# Patient Record
Sex: Female | Born: 1940 | Race: Black or African American | Hispanic: No | Marital: Single | Smoking: Former smoker
Health system: Southern US, Community
[De-identification: ages and names within clinical notes are randomized; demographics above are authoritative.]

## PROBLEM LIST (undated history)

## (undated) DIAGNOSIS — M542 Cervicalgia: Secondary | ICD-10-CM

## (undated) DIAGNOSIS — K219 Gastro-esophageal reflux disease without esophagitis: Secondary | ICD-10-CM

## (undated) DIAGNOSIS — M545 Low back pain, unspecified: Secondary | ICD-10-CM

## (undated) DIAGNOSIS — E785 Hyperlipidemia, unspecified: Secondary | ICD-10-CM

## (undated) DIAGNOSIS — I10 Essential (primary) hypertension: Secondary | ICD-10-CM

## (undated) DIAGNOSIS — M329 Systemic lupus erythematosus, unspecified: Secondary | ICD-10-CM

## (undated) DIAGNOSIS — L309 Dermatitis, unspecified: Secondary | ICD-10-CM

## (undated) DIAGNOSIS — G4730 Sleep apnea, unspecified: Secondary | ICD-10-CM

## (undated) DIAGNOSIS — I509 Heart failure, unspecified: Secondary | ICD-10-CM

## (undated) DIAGNOSIS — M5136 Other intervertebral disc degeneration, lumbar region: Secondary | ICD-10-CM

## (undated) DIAGNOSIS — IMO0002 Reserved for concepts with insufficient information to code with codable children: Secondary | ICD-10-CM

## (undated) DIAGNOSIS — M8580 Other specified disorders of bone density and structure, unspecified site: Secondary | ICD-10-CM

## (undated) DIAGNOSIS — M1990 Unspecified osteoarthritis, unspecified site: Secondary | ICD-10-CM

## (undated) DIAGNOSIS — E119 Type 2 diabetes mellitus without complications: Secondary | ICD-10-CM

## (undated) DIAGNOSIS — J449 Chronic obstructive pulmonary disease, unspecified: Secondary | ICD-10-CM

## (undated) DIAGNOSIS — J309 Allergic rhinitis, unspecified: Secondary | ICD-10-CM

## (undated) DIAGNOSIS — C801 Malignant (primary) neoplasm, unspecified: Secondary | ICD-10-CM

## (undated) DIAGNOSIS — E669 Obesity, unspecified: Secondary | ICD-10-CM

## (undated) DIAGNOSIS — R0600 Dyspnea, unspecified: Secondary | ICD-10-CM

## (undated) DIAGNOSIS — F419 Anxiety disorder, unspecified: Secondary | ICD-10-CM

## (undated) DIAGNOSIS — H409 Unspecified glaucoma: Secondary | ICD-10-CM

## (undated) DIAGNOSIS — M51369 Other intervertebral disc degeneration, lumbar region without mention of lumbar back pain or lower extremity pain: Secondary | ICD-10-CM

## (undated) DIAGNOSIS — Z9981 Dependence on supplemental oxygen: Secondary | ICD-10-CM

## (undated) DIAGNOSIS — J45909 Unspecified asthma, uncomplicated: Secondary | ICD-10-CM

## (undated) DIAGNOSIS — R202 Paresthesia of skin: Secondary | ICD-10-CM

## (undated) DIAGNOSIS — I2699 Other pulmonary embolism without acute cor pulmonale: Secondary | ICD-10-CM

## (undated) DIAGNOSIS — R200 Anesthesia of skin: Secondary | ICD-10-CM

## (undated) HISTORY — DX: Other intervertebral disc degeneration, lumbar region: M51.36

## (undated) HISTORY — PX: BREAST BIOPSY: SHX20

## (undated) HISTORY — DX: Paresthesia of skin: R20.0

## (undated) HISTORY — DX: Allergic rhinitis, unspecified: J30.9

## (undated) HISTORY — PX: EXPLORATORY LAPAROTOMY: SUR591

## (undated) HISTORY — DX: Other intervertebral disc degeneration, lumbar region without mention of lumbar back pain or lower extremity pain: M51.369

## (undated) HISTORY — DX: Unspecified asthma, uncomplicated: J45.909

## (undated) HISTORY — DX: Unspecified glaucoma: H40.9

## (undated) HISTORY — PX: BREAST EXCISIONAL BIOPSY: SUR124

## (undated) HISTORY — DX: Unspecified osteoarthritis, unspecified site: M19.90

## (undated) HISTORY — PX: BREAST LUMPECTOMY: SHX2

## (undated) HISTORY — DX: Hyperlipidemia, unspecified: E78.5

## (undated) HISTORY — DX: Low back pain, unspecified: M54.50

## (undated) HISTORY — DX: Low back pain: M54.5

## (undated) HISTORY — DX: Dermatitis, unspecified: L30.9

## (undated) HISTORY — DX: Anxiety disorder, unspecified: F41.9

## (undated) HISTORY — DX: Sleep apnea, unspecified: G47.30

## (undated) HISTORY — DX: Essential (primary) hypertension: I10

## (undated) HISTORY — DX: Other pulmonary embolism without acute cor pulmonale: I26.99

## (undated) HISTORY — DX: Systemic lupus erythematosus, unspecified: M32.9

## (undated) HISTORY — DX: Obesity, unspecified: E66.9

## (undated) HISTORY — DX: Anesthesia of skin: R20.2

## (undated) HISTORY — DX: Cervicalgia: M54.2

## (undated) HISTORY — DX: Gastro-esophageal reflux disease without esophagitis: K21.9

## (undated) HISTORY — DX: Other specified disorders of bone density and structure, unspecified site: M85.80

---

## 1974-04-18 HISTORY — PX: ABDOMINAL HYSTERECTOMY: SHX81

## 1994-04-18 HISTORY — PX: TOE SURGERY: SHX1073

## 2004-04-18 DIAGNOSIS — C801 Malignant (primary) neoplasm, unspecified: Secondary | ICD-10-CM

## 2004-04-18 HISTORY — PX: BACK SURGERY: SHX140

## 2004-04-18 HISTORY — DX: Malignant (primary) neoplasm, unspecified: C80.1

## 2008-04-18 DIAGNOSIS — G4730 Sleep apnea, unspecified: Secondary | ICD-10-CM

## 2008-04-18 HISTORY — PX: EYE SURGERY: SHX253

## 2008-04-18 HISTORY — DX: Sleep apnea, unspecified: G47.30

## 2008-04-29 ENCOUNTER — Ambulatory Visit: Payer: Self-pay | Admitting: Oncology

## 2008-05-02 HISTORY — PX: OTHER SURGICAL HISTORY: SHX169

## 2008-08-05 ENCOUNTER — Ambulatory Visit: Payer: Self-pay | Admitting: Oncology

## 2009-03-31 ENCOUNTER — Encounter: Admission: RE | Admit: 2009-03-31 | Discharge: 2009-03-31 | Payer: Self-pay | Admitting: Internal Medicine

## 2009-05-01 ENCOUNTER — Inpatient Hospital Stay (HOSPITAL_COMMUNITY): Admission: EM | Admit: 2009-05-01 | Discharge: 2009-05-05 | Payer: Self-pay | Admitting: Emergency Medicine

## 2009-05-04 ENCOUNTER — Encounter (INDEPENDENT_AMBULATORY_CARE_PROVIDER_SITE_OTHER): Payer: Self-pay | Admitting: Internal Medicine

## 2009-05-04 ENCOUNTER — Ambulatory Visit: Payer: Self-pay | Admitting: Vascular Surgery

## 2009-06-19 ENCOUNTER — Encounter: Admission: RE | Admit: 2009-06-19 | Discharge: 2009-06-19 | Payer: Self-pay | Admitting: Rheumatology

## 2010-01-16 ENCOUNTER — Emergency Department (HOSPITAL_COMMUNITY): Admission: EM | Admit: 2010-01-16 | Discharge: 2010-01-16 | Payer: Self-pay | Admitting: Emergency Medicine

## 2010-01-25 ENCOUNTER — Inpatient Hospital Stay (HOSPITAL_COMMUNITY): Admission: EM | Admit: 2010-01-25 | Discharge: 2010-02-02 | Payer: Self-pay | Admitting: Emergency Medicine

## 2010-04-01 ENCOUNTER — Encounter
Admission: RE | Admit: 2010-04-01 | Discharge: 2010-04-01 | Payer: Self-pay | Source: Home / Self Care | Attending: Internal Medicine | Admitting: Internal Medicine

## 2010-04-08 ENCOUNTER — Encounter
Admission: RE | Admit: 2010-04-08 | Discharge: 2010-04-08 | Payer: Self-pay | Source: Home / Self Care | Attending: Internal Medicine | Admitting: Internal Medicine

## 2010-06-30 LAB — BASIC METABOLIC PANEL
BUN: 28 mg/dL — ABNORMAL HIGH (ref 6–23)
BUN: 31 mg/dL — ABNORMAL HIGH (ref 6–23)
CO2: 30 mEq/L (ref 19–32)
Chloride: 100 mEq/L (ref 96–112)
Chloride: 101 mEq/L (ref 96–112)
Chloride: 102 mEq/L (ref 96–112)
Creatinine, Ser: 1.51 mg/dL — ABNORMAL HIGH (ref 0.4–1.2)
GFR calc Af Amer: 41 mL/min — ABNORMAL LOW (ref >60)
GFR calc Af Amer: 53 mL/min — ABNORMAL LOW (ref >60)
GFR calc non Af Amer: 34 mL/min — ABNORMAL LOW (ref >60)
Glucose, Bld: 170 mg/dL — ABNORMAL HIGH (ref 70–99)
Potassium: 4.3 mEq/L (ref 3.5–5.1)
Potassium: 4.5 mEq/L (ref 3.5–5.1)
Potassium: 4.7 mEq/L (ref 3.5–5.1)
Sodium: 137 mEq/L (ref 135–145)
Sodium: 138 mEq/L (ref 135–145)

## 2010-06-30 LAB — CBC
HCT: 33.2 % — ABNORMAL LOW (ref 36.0–46.0)
HCT: 33.5 % — ABNORMAL LOW (ref 36.0–46.0)
HCT: 35.5 % — ABNORMAL LOW (ref 36.0–46.0)
Hemoglobin: 11.2 g/dL — ABNORMAL LOW (ref 12.0–15.0)
MCH: 29.4 pg (ref 26.0–34.0)
MCHC: 33.6 g/dL (ref 30.0–36.0)
MCV: 86.3 fL (ref 78.0–100.0)
MCV: 87.3 fL (ref 78.0–100.0)
Platelets: 253 10*3/uL (ref 150–400)
RBC: 3.81 MIL/uL — ABNORMAL LOW (ref 3.87–5.11)
RBC: 4.11 MIL/uL (ref 3.87–5.11)
RDW: 14.4 % (ref 11.5–15.5)
RDW: 14.8 % (ref 11.5–15.5)
WBC: 11.4 10*3/uL — ABNORMAL HIGH (ref 4.0–10.5)
WBC: 16.7 10*3/uL — ABNORMAL HIGH (ref 4.0–10.5)

## 2010-06-30 LAB — GLUCOSE, CAPILLARY
Glucose-Capillary: 253 mg/dL — ABNORMAL HIGH (ref 70–99)
Glucose-Capillary: 318 mg/dL — ABNORMAL HIGH (ref 70–99)
Glucose-Capillary: 339 mg/dL — ABNORMAL HIGH (ref 70–99)

## 2010-07-01 LAB — POCT CARDIAC MARKERS
CKMB, poc: 8 ng/mL (ref 1.0–8.0)
Troponin i, poc: <0.05 ng/mL (ref 0.00–0.09)

## 2010-07-01 LAB — DIFFERENTIAL
Eosinophils Absolute: 0 10*3/uL (ref 0.0–0.7)
Eosinophils Absolute: 0.4 10*3/uL (ref 0.0–0.7)
Eosinophils Relative: 5 % (ref 0–5)
Eosinophils Relative: 6 % — ABNORMAL HIGH (ref 0–5)
Lymphocytes Relative: 38 % (ref 12–46)
Lymphs Abs: 2.6 10*3/uL (ref 0.7–4.0)
Lymphs Abs: 0.9 10*3/uL (ref 0.7–4.0)
Lymphs Abs: 2.1 10*3/uL (ref 0.7–4.0)
Neutro Abs: 3.6 10*3/uL (ref 1.7–7.7)
Neutrophils Relative %: 55 % (ref 43–77)
Neutrophils Relative %: 89 % — ABNORMAL HIGH (ref 43–77)

## 2010-07-01 LAB — GLUCOSE, CAPILLARY
Glucose-Capillary: 151 mg/dL — ABNORMAL HIGH (ref 70–99)
Glucose-Capillary: 179 mg/dL — ABNORMAL HIGH (ref 70–99)
Glucose-Capillary: 185 mg/dL — ABNORMAL HIGH (ref 70–99)
Glucose-Capillary: 280 mg/dL — ABNORMAL HIGH (ref 70–99)
Glucose-Capillary: 289 mg/dL — ABNORMAL HIGH (ref 70–99)
Glucose-Capillary: 353 mg/dL — ABNORMAL HIGH (ref 70–99)
Glucose-Capillary: 421 mg/dL — ABNORMAL HIGH (ref 70–99)
Glucose-Capillary: 445 mg/dL — ABNORMAL HIGH (ref 70–99)
Glucose-Capillary: 447 mg/dL — ABNORMAL HIGH (ref 70–99)
Glucose-Capillary: 451 mg/dL — ABNORMAL HIGH (ref 70–99)
Glucose-Capillary: 462 mg/dL — ABNORMAL HIGH (ref 70–99)
Glucose-Capillary: 470 mg/dL — ABNORMAL HIGH (ref 70–99)
Glucose-Capillary: 472 mg/dL — ABNORMAL HIGH (ref 70–99)

## 2010-07-01 LAB — CBC
HCT: 34.5 % — ABNORMAL LOW (ref 36.0–46.0)
Hemoglobin: 11.2 g/dL — ABNORMAL LOW (ref 12.0–15.0)
MCH: 28.9 pg (ref 26.0–34.0)
MCH: 29.3 pg (ref 26.0–34.0)
MCH: 29.3 pg (ref 26.0–34.0)
MCHC: 33.5 g/dL (ref 30.0–36.0)
MCHC: 33.5 g/dL (ref 30.0–36.0)
MCV: 86 fL (ref 78.0–100.0)
MCV: 87.5 fL (ref 78.0–100.0)
Platelets: 247 10*3/uL (ref 150–400)
Platelets: 247 10*3/uL (ref 150–400)
Platelets: 266 10*3/uL (ref 150–400)
RBC: 4.01 MIL/uL (ref 3.87–5.11)
RBC: 3.75 MIL/uL — ABNORMAL LOW (ref 3.87–5.11)
RBC: 3.85 MIL/uL — ABNORMAL LOW (ref 3.87–5.11)
RDW: 14.5 % (ref 11.5–15.5)
RDW: 14.6 % (ref 11.5–15.5)
WBC: 7 10*3/uL (ref 4.0–10.5)
WBC: 8.3 10*3/uL (ref 4.0–10.5)

## 2010-07-01 LAB — BASIC METABOLIC PANEL
BUN: 19 mg/dL (ref 6–23)
BUN: 25 mg/dL — ABNORMAL HIGH (ref 6–23)
CO2: 28 mEq/L (ref 19–32)
CO2: 29 mEq/L (ref 19–32)
Calcium: 8.4 mg/dL (ref 8.4–10.5)
Calcium: 9.6 mg/dL (ref 8.4–10.5)
Chloride: 109 mEq/L (ref 96–112)
Chloride: 98 mEq/L (ref 96–112)
Creatinine, Ser: 1.2 mg/dL (ref 0.4–1.2)
Creatinine, Ser: 1.36 mg/dL — ABNORMAL HIGH (ref 0.4–1.2)
GFR calc Af Amer: 54 mL/min — ABNORMAL LOW (ref >60)
GFR calc Af Amer: 47 mL/min — ABNORMAL LOW (ref >60)
GFR calc non Af Amer: 45 mL/min — ABNORMAL LOW (ref >60)
GFR calc non Af Amer: 40 mL/min — ABNORMAL LOW (ref >60)
Glucose, Bld: 367 mg/dL — ABNORMAL HIGH (ref 70–99)
Glucose, Bld: 435 mg/dL — ABNORMAL HIGH (ref 70–99)
Potassium: 3.6 mEq/L (ref 3.5–5.1)
Potassium: 4.6 mEq/L (ref 3.5–5.1)
Sodium: 135 mEq/L (ref 135–145)
Sodium: 136 mEq/L (ref 135–145)

## 2010-07-01 LAB — CARDIAC PANEL(CRET KIN+CKTOT+MB+TROPI)
CK, MB: 11.2 ng/mL (ref 0.3–4.0)
CK, MB: 13.1 ng/mL (ref 0.3–4.0)
Relative Index: 1.8 (ref 0.0–2.5)
Relative Index: 1.9 (ref 0.0–2.5)
Total CK: 562 U/L — ABNORMAL HIGH (ref 7–177)
Total CK: 630 U/L — ABNORMAL HIGH (ref 7–177)
Troponin I: 0.03 ng/mL (ref 0.00–0.06)

## 2010-07-01 LAB — BRAIN NATRIURETIC PEPTIDE: Pro B Natriuretic peptide (BNP): 133 pg/mL — ABNORMAL HIGH (ref 0.0–100.0)

## 2010-07-01 LAB — GLUCOSE, RANDOM
Glucose, Bld: 449 mg/dL — ABNORMAL HIGH (ref 70–99)
Glucose, Bld: 460 mg/dL — ABNORMAL HIGH (ref 70–99)

## 2010-07-01 LAB — HEMOGLOBIN A1C: Hgb A1c MFr Bld: 7.6 % — ABNORMAL HIGH (ref <5.7)

## 2010-07-04 LAB — CK TOTAL AND CKMB (NOT AT ARMC)
CK, MB: 23.1 ng/mL (ref 0.3–4.0)
Relative Index: 1.7 (ref 0.0–2.5)
Total CK: 1399 U/L — ABNORMAL HIGH (ref 7–177)

## 2010-07-04 LAB — DIFFERENTIAL
Basophils Absolute: 0 K/uL (ref 0.0–0.1)
Basophils Relative: 0 % (ref 0–1)
Eosinophils Absolute: 0.2 K/uL (ref 0.0–0.7)
Eosinophils Relative: 2 % (ref 0–5)
Lymphocytes Relative: 22 % (ref 12–46)
Lymphs Abs: 1.6 K/uL (ref 0.7–4.0)
Monocytes Absolute: 0.3 K/uL (ref 0.1–1.0)
Monocytes Relative: 4 % (ref 3–12)
Neutro Abs: 5.4 K/uL (ref 1.7–7.7)
Neutrophils Relative %: 72 % (ref 43–77)

## 2010-07-04 LAB — CBC
HCT: 38 % (ref 36.0–46.0)
Hemoglobin: 12.8 g/dL (ref 12.0–15.0)
MCHC: 33.8 g/dL (ref 30.0–36.0)
MCV: 87.2 fL (ref 78.0–100.0)
MCV: 87.6 fL (ref 78.0–100.0)
Platelets: 243 K/uL (ref 150–400)
RBC: 4.33 MIL/uL (ref 3.87–5.11)
RBC: 4.54 MIL/uL (ref 3.87–5.11)
RDW: 13.8 % (ref 11.5–15.5)
WBC: 6.7 10*3/uL (ref 4.0–10.5)
WBC: 7.5 K/uL (ref 4.0–10.5)

## 2010-07-04 LAB — GLUCOSE, CAPILLARY: Glucose-Capillary: 191 mg/dL — ABNORMAL HIGH (ref 70–99)

## 2010-07-04 LAB — BLOOD GAS, ARTERIAL
Bicarbonate: 29.9 mEq/L — ABNORMAL HIGH (ref 20.0–24.0)
TCO2: 31.3 mmol/L (ref 0–100)
pCO2 arterial: 47.1 mmHg — ABNORMAL HIGH (ref 35.0–45.0)
pH, Arterial: 7.418 — ABNORMAL HIGH (ref 7.350–7.400)

## 2010-07-04 LAB — BASIC METABOLIC PANEL
BUN: 28 mg/dL — ABNORMAL HIGH (ref 6–23)
CO2: 32 mEq/L (ref 19–32)
Chloride: 101 mEq/L (ref 96–112)
Chloride: 97 mEq/L (ref 96–112)
Creatinine, Ser: 1.53 mg/dL — ABNORMAL HIGH (ref 0.4–1.2)
Creatinine, Ser: 1.89 mg/dL — ABNORMAL HIGH (ref 0.4–1.2)
GFR calc Af Amer: 41 mL/min — ABNORMAL LOW (ref >60)
Potassium: 4.4 mEq/L (ref 3.5–5.1)

## 2010-07-04 LAB — CARDIAC PANEL(CRET KIN+CKTOT+MB+TROPI)
CK, MB: 10.4 ng/mL (ref 0.3–4.0)
Relative Index: 1.2 (ref 0.0–2.5)
Total CK: 851 U/L — ABNORMAL HIGH (ref 7–177)
Troponin I: 0.03 ng/mL (ref 0.00–0.06)

## 2010-07-04 LAB — POCT I-STAT, CHEM 8
BUN: 30 mg/dL — ABNORMAL HIGH (ref 6–23)
Calcium, Ion: 1.14 mmol/L (ref 1.12–1.32)
Chloride: 106 meq/L (ref 96–112)
Creatinine, Ser: 2.1 mg/dL — ABNORMAL HIGH (ref 0.4–1.2)
Glucose, Bld: 124 mg/dL — ABNORMAL HIGH (ref 70–99)
HCT: 40 % (ref 36.0–46.0)
Hemoglobin: 13.6 g/dL (ref 12.0–15.0)
Potassium: 4.5 meq/L (ref 3.5–5.1)
Sodium: 142 meq/L (ref 135–145)
TCO2: 35 mmol/L (ref 0–100)

## 2010-07-04 LAB — POCT CARDIAC MARKERS
CKMB, poc: 19.4 ng/mL (ref 1.0–8.0)
Myoglobin, poc: >500 ng/mL (ref 12–200)
Troponin i, poc: <0.05 ng/mL (ref 0.00–0.09)

## 2010-07-04 LAB — URINALYSIS, ROUTINE W REFLEX MICROSCOPIC
Bilirubin Urine: NEGATIVE
Glucose, UA: NEGATIVE mg/dL
Ketones, ur: NEGATIVE mg/dL
pH: 5.5 (ref 5.0–8.0)

## 2010-07-04 LAB — TROPONIN I

## 2010-07-04 LAB — TSH: TSH: 0.877 u[IU]/mL (ref 0.350–4.500)

## 2010-07-04 LAB — D-DIMER, QUANTITATIVE: D-Dimer, Quant: 0.49 ug/mL-FEU — ABNORMAL HIGH (ref 0.00–0.48)

## 2010-07-04 LAB — BRAIN NATRIURETIC PEPTIDE: Pro B Natriuretic peptide (BNP): 119 pg/mL — ABNORMAL HIGH (ref 0.0–100.0)

## 2010-07-05 LAB — CBC
HCT: 36 % (ref 36.0–46.0)
MCHC: 33.8 g/dL (ref 30.0–36.0)
MCV: 87.2 fL (ref 78.0–100.0)
MCV: 87.3 fL (ref 78.0–100.0)
Platelets: 217 10*3/uL (ref 150–400)
Platelets: 222 10*3/uL (ref 150–400)
Platelets: 224 10*3/uL (ref 150–400)
RBC: 4.13 MIL/uL (ref 3.87–5.11)
RDW: 13.3 % (ref 11.5–15.5)
RDW: 13.8 % (ref 11.5–15.5)
WBC: 9.4 10*3/uL (ref 4.0–10.5)
WBC: 9.5 10*3/uL (ref 4.0–10.5)

## 2010-07-05 LAB — HEPARIN LEVEL (UNFRACTIONATED)
Heparin Unfractionated: 0.24 IU/mL — ABNORMAL LOW (ref 0.30–0.70)
Heparin Unfractionated: 0.3 IU/mL (ref 0.30–0.70)

## 2010-07-05 LAB — GLUCOSE, CAPILLARY
Glucose-Capillary: 137 mg/dL — ABNORMAL HIGH (ref 70–99)
Glucose-Capillary: 221 mg/dL — ABNORMAL HIGH (ref 70–99)
Glucose-Capillary: 391 mg/dL — ABNORMAL HIGH (ref 70–99)
Glucose-Capillary: 88 mg/dL (ref 70–99)

## 2010-07-05 LAB — BASIC METABOLIC PANEL
BUN: 32 mg/dL — ABNORMAL HIGH (ref 6–23)
Calcium: 8.9 mg/dL (ref 8.4–10.5)
Chloride: 97 mEq/L (ref 96–112)
Creatinine, Ser: 1.51 mg/dL — ABNORMAL HIGH (ref 0.4–1.2)
GFR calc Af Amer: 42 mL/min — ABNORMAL LOW (ref >60)
GFR calc non Af Amer: 34 mL/min — ABNORMAL LOW (ref >60)
Sodium: 135 mEq/L (ref 135–145)

## 2011-03-03 ENCOUNTER — Other Ambulatory Visit: Payer: Self-pay | Admitting: Internal Medicine

## 2011-03-03 DIAGNOSIS — N6019 Diffuse cystic mastopathy of unspecified breast: Secondary | ICD-10-CM

## 2011-05-04 ENCOUNTER — Ambulatory Visit
Admission: RE | Admit: 2011-05-04 | Discharge: 2011-05-04 | Disposition: A | Discharge status: Home / Self Care | Payer: Medicare Other | Source: Ambulatory Visit | Attending: Internal Medicine | Admitting: Internal Medicine

## 2011-05-04 DIAGNOSIS — N6019 Diffuse cystic mastopathy of unspecified breast: Secondary | ICD-10-CM

## 2012-01-17 DIAGNOSIS — I2699 Other pulmonary embolism without acute cor pulmonale: Secondary | ICD-10-CM

## 2012-01-17 HISTORY — DX: Other pulmonary embolism without acute cor pulmonale: I26.99

## 2012-01-24 ENCOUNTER — Encounter (HOSPITAL_COMMUNITY): Payer: Self-pay | Admitting: *Deleted

## 2012-01-24 ENCOUNTER — Inpatient Hospital Stay (HOSPITAL_COMMUNITY)
Admission: EM | Admit: 2012-01-24 | Discharge: 2012-01-29 | DRG: 176 | Disposition: A | Discharge status: Home Health | Payer: Medicare Other | Attending: Internal Medicine | Admitting: Internal Medicine

## 2012-01-24 ENCOUNTER — Inpatient Hospital Stay (HOSPITAL_COMMUNITY): Payer: Medicare Other

## 2012-01-24 ENCOUNTER — Emergency Department (HOSPITAL_COMMUNITY): Payer: Medicare Other

## 2012-01-24 DIAGNOSIS — M329 Systemic lupus erythematosus, unspecified: Secondary | ICD-10-CM | POA: Diagnosis present

## 2012-01-24 DIAGNOSIS — D649 Anemia, unspecified: Secondary | ICD-10-CM

## 2012-01-24 DIAGNOSIS — E119 Type 2 diabetes mellitus without complications: Secondary | ICD-10-CM

## 2012-01-24 DIAGNOSIS — Z9981 Dependence on supplemental oxygen: Secondary | ICD-10-CM

## 2012-01-24 DIAGNOSIS — D72829 Elevated white blood cell count, unspecified: Secondary | ICD-10-CM

## 2012-01-24 DIAGNOSIS — C50919 Malignant neoplasm of unspecified site of unspecified female breast: Secondary | ICD-10-CM | POA: Diagnosis present

## 2012-01-24 DIAGNOSIS — E1159 Type 2 diabetes mellitus with other circulatory complications: Secondary | ICD-10-CM | POA: Diagnosis present

## 2012-01-24 DIAGNOSIS — Z23 Encounter for immunization: Secondary | ICD-10-CM

## 2012-01-24 DIAGNOSIS — I5032 Chronic diastolic (congestive) heart failure: Secondary | ICD-10-CM

## 2012-01-24 DIAGNOSIS — J449 Chronic obstructive pulmonary disease, unspecified: Secondary | ICD-10-CM

## 2012-01-24 DIAGNOSIS — J40 Bronchitis, not specified as acute or chronic: Secondary | ICD-10-CM

## 2012-01-24 DIAGNOSIS — Z7982 Long term (current) use of aspirin: Secondary | ICD-10-CM

## 2012-01-24 DIAGNOSIS — I2699 Other pulmonary embolism without acute cor pulmonale: Principal | ICD-10-CM

## 2012-01-24 DIAGNOSIS — J441 Chronic obstructive pulmonary disease with (acute) exacerbation: Secondary | ICD-10-CM | POA: Diagnosis present

## 2012-01-24 DIAGNOSIS — T380X5A Adverse effect of glucocorticoids and synthetic analogues, initial encounter: Secondary | ICD-10-CM | POA: Diagnosis present

## 2012-01-24 DIAGNOSIS — I4510 Unspecified right bundle-branch block: Secondary | ICD-10-CM | POA: Diagnosis present

## 2012-01-24 DIAGNOSIS — T451X5A Adverse effect of antineoplastic and immunosuppressive drugs, initial encounter: Secondary | ICD-10-CM | POA: Diagnosis present

## 2012-01-24 DIAGNOSIS — J189 Pneumonia, unspecified organism: Secondary | ICD-10-CM

## 2012-01-24 DIAGNOSIS — Z880 Allergy status to penicillin: Secondary | ICD-10-CM

## 2012-01-24 DIAGNOSIS — Z9010 Acquired absence of unspecified breast and nipple: Secondary | ICD-10-CM

## 2012-01-24 DIAGNOSIS — Z90710 Acquired absence of both cervix and uterus: Secondary | ICD-10-CM

## 2012-01-24 DIAGNOSIS — R5381 Other malaise: Secondary | ICD-10-CM | POA: Diagnosis present

## 2012-01-24 DIAGNOSIS — IMO0001 Reserved for inherently not codable concepts without codable children: Secondary | ICD-10-CM | POA: Diagnosis present

## 2012-01-24 DIAGNOSIS — Z79899 Other long term (current) drug therapy: Secondary | ICD-10-CM

## 2012-01-24 DIAGNOSIS — I509 Heart failure, unspecified: Secondary | ICD-10-CM | POA: Diagnosis present

## 2012-01-24 DIAGNOSIS — Y92009 Unspecified place in unspecified non-institutional (private) residence as the place of occurrence of the external cause: Secondary | ICD-10-CM

## 2012-01-24 DIAGNOSIS — I10 Essential (primary) hypertension: Secondary | ICD-10-CM

## 2012-01-24 HISTORY — DX: Type 2 diabetes mellitus without complications: E11.9

## 2012-01-24 HISTORY — DX: Reserved for concepts with insufficient information to code with codable children: IMO0002

## 2012-01-24 HISTORY — DX: Systemic lupus erythematosus, unspecified: M32.9

## 2012-01-24 HISTORY — DX: Chronic obstructive pulmonary disease, unspecified: J44.9

## 2012-01-24 HISTORY — DX: Malignant (primary) neoplasm, unspecified: C80.1

## 2012-01-24 HISTORY — DX: Heart failure, unspecified: I50.9

## 2012-01-24 LAB — CBC WITH DIFFERENTIAL/PLATELET
Eosinophils Absolute: 0.2 10*3/uL (ref 0.0–0.7)
Eosinophils Relative: 2 % (ref 0–5)
HCT: 35.2 % — ABNORMAL LOW (ref 36.0–46.0)
Lymphocytes Relative: 16 % (ref 12–46)
Lymphs Abs: 1.8 10*3/uL (ref 0.7–4.0)
MCH: 28.7 pg (ref 26.0–34.0)
MCV: 84.8 fL (ref 78.0–100.0)
Monocytes Absolute: 0.5 10*3/uL (ref 0.1–1.0)
Monocytes Relative: 4 % (ref 3–12)
Platelets: 250 10*3/uL (ref 150–400)
RBC: 4.15 MIL/uL (ref 3.87–5.11)

## 2012-01-24 LAB — URINALYSIS, ROUTINE W REFLEX MICROSCOPIC
Bilirubin Urine: NEGATIVE
Glucose, UA: 100 mg/dL — AB
Specific Gravity, Urine: 1.017 (ref 1.005–1.030)
Urobilinogen, UA: 0.2 mg/dL (ref 0.0–1.0)

## 2012-01-24 LAB — COMPREHENSIVE METABOLIC PANEL
ALT: 20 U/L (ref 0–35)
AST: 21 U/L (ref 0–37)
Albumin: 3.3 g/dL — ABNORMAL LOW (ref 3.5–5.2)
CO2: 24 mEq/L (ref 19–32)
Calcium: 9.4 mg/dL (ref 8.4–10.5)
Chloride: 101 mEq/L (ref 96–112)
GFR calc non Af Amer: 59 mL/min — ABNORMAL LOW (ref >90)
Sodium: 138 mEq/L (ref 135–145)
Total Bilirubin: 0.3 mg/dL (ref 0.3–1.2)

## 2012-01-24 LAB — PRO B NATRIURETIC PEPTIDE: Pro B Natriuretic peptide (BNP): 554.7 pg/mL — ABNORMAL HIGH (ref 0–125)

## 2012-01-24 LAB — BLOOD GAS, ARTERIAL
Bicarbonate: 23.5 mEq/L (ref 20.0–24.0)
TCO2: 21.3 mmol/L (ref 0–100)
pCO2 arterial: 35.9 mmHg (ref 35.0–45.0)
pH, Arterial: 7.431 (ref 7.350–7.450)
pO2, Arterial: 65.5 mmHg — ABNORMAL LOW (ref 80.0–100.0)

## 2012-01-24 LAB — TROPONIN I: Troponin I: <0.3 ng/mL (ref <0.30)

## 2012-01-24 LAB — URINE MICROSCOPIC-ADD ON

## 2012-01-24 LAB — MRSA PCR SCREENING: MRSA by PCR: NEGATIVE

## 2012-01-24 LAB — GLUCOSE, CAPILLARY

## 2012-01-24 LAB — PROTIME-INR: Prothrombin Time: 14.3 seconds (ref 11.6–15.2)

## 2012-01-24 MED ORDER — IPRATROPIUM BROMIDE 0.02 % IN SOLN
0.5000 mg | Freq: Once | RESPIRATORY_TRACT | Status: AC
  Administered 2012-01-24: 0.5 mg via RESPIRATORY_TRACT
  Filled 2012-01-24: qty 2.5

## 2012-01-24 MED ORDER — ACETAMINOPHEN 325 MG PO TABS
650.0000 mg | ORAL_TABLET | Freq: Once | ORAL | Status: AC
  Administered 2012-01-24: 650 mg via ORAL
  Filled 2012-01-24: qty 2

## 2012-01-24 MED ORDER — ALBUTEROL SULFATE HFA 108 (90 BASE) MCG/ACT IN AERS
2.0000 | INHALATION_SPRAY | Freq: Two times a day (BID) | RESPIRATORY_TRACT | Status: DC
  Administered 2012-01-24: 2 via RESPIRATORY_TRACT
  Filled 2012-01-24: qty 6.7

## 2012-01-24 MED ORDER — INSULIN ASPART 100 UNIT/ML ~~LOC~~ SOLN
12.0000 [IU] | Freq: Once | SUBCUTANEOUS | Status: AC
  Administered 2012-01-24: 12 [IU] via SUBCUTANEOUS

## 2012-01-24 MED ORDER — ASPIRIN 325 MG PO TABS
325.0000 mg | ORAL_TABLET | Freq: Every morning | ORAL | Status: DC
  Administered 2012-01-25 – 2012-01-29 (×5): 325 mg via ORAL
  Filled 2012-01-24 (×5): qty 1

## 2012-01-24 MED ORDER — PREDNISONE 20 MG PO TABS
60.0000 mg | ORAL_TABLET | Freq: Once | ORAL | Status: AC
  Administered 2012-01-24: 60 mg via ORAL
  Filled 2012-01-24: qty 3

## 2012-01-24 MED ORDER — FUROSEMIDE 10 MG/ML IJ SOLN: 40.0000 mg | Freq: Two times a day (BID) | INTRAMUSCULAR | Status: DC

## 2012-01-24 MED ORDER — BUDESONIDE-FORMOTEROL FUMARATE 160-4.5 MCG/ACT IN AERO
2.0000 | INHALATION_SPRAY | Freq: Two times a day (BID) | RESPIRATORY_TRACT | Status: DC
  Administered 2012-01-24 – 2012-01-27 (×6): 2 via RESPIRATORY_TRACT
  Filled 2012-01-24: qty 6

## 2012-01-24 MED ORDER — DILTIAZEM HCL ER BEADS 300 MG PO CP24
300.0000 mg | ORAL_CAPSULE | Freq: Every morning | ORAL | Status: DC
  Administered 2012-01-25 – 2012-01-29 (×5): 300 mg via ORAL
  Filled 2012-01-24 (×5): qty 1

## 2012-01-24 MED ORDER — IOHEXOL 350 MG/ML SOLN
100.0000 mL | Freq: Once | INTRAVENOUS | Status: AC | PRN
  Administered 2012-01-24: 100 mL via INTRAVENOUS

## 2012-01-24 MED ORDER — ENOXAPARIN SODIUM 120 MG/0.8ML ~~LOC~~ SOLN
120.0000 mg | Freq: Once | SUBCUTANEOUS | Status: AC
  Administered 2012-01-24: 120 mg via SUBCUTANEOUS
  Filled 2012-01-24: qty 0.8

## 2012-01-24 MED ORDER — ACETAMINOPHEN ER 650 MG PO TBCR
650.0000 mg | EXTENDED_RELEASE_TABLET | Freq: Three times a day (TID) | ORAL | Status: DC | PRN
Start: 2012-01-24 — End: 2012-01-24

## 2012-01-24 MED ORDER — OXYCODONE HCL 5 MG PO TABS
5.0000 mg | ORAL_TABLET | ORAL | Status: DC | PRN
  Administered 2012-01-26: 5 mg via ORAL
  Filled 2012-01-24 (×2): qty 1

## 2012-01-24 MED ORDER — FUROSEMIDE 40 MG PO TABS
40.0000 mg | ORAL_TABLET | Freq: Every day | ORAL | Status: DC
  Administered 2012-01-24 – 2012-01-29 (×6): 40 mg via ORAL
  Filled 2012-01-24 (×6): qty 1

## 2012-01-24 MED ORDER — INSULIN ASPART 100 UNIT/ML ~~LOC~~ SOLN
0.0000 [IU] | Freq: Three times a day (TID) | SUBCUTANEOUS | Status: DC
  Administered 2012-01-24: 2 [IU] via SUBCUTANEOUS
  Administered 2012-01-25: 9 [IU] via SUBCUTANEOUS
  Filled 2012-01-24: qty 1

## 2012-01-24 MED ORDER — SODIUM CHLORIDE 0.9 % IV BOLUS (SEPSIS)
500.0000 mL | Freq: Once | INTRAVENOUS | Status: AC
  Administered 2012-01-24: 500 mL via INTRAVENOUS

## 2012-01-24 MED ORDER — COUMADIN BOOK
Freq: Once | Status: AC
  Administered 2012-01-24: 22:00:00
  Filled 2012-01-24: qty 1

## 2012-01-24 MED ORDER — ENALAPRIL MALEATE 20 MG PO TABS
20.0000 mg | ORAL_TABLET | Freq: Every morning | ORAL | Status: DC
  Administered 2012-01-25 – 2012-01-29 (×5): 20 mg via ORAL
  Filled 2012-01-24 (×6): qty 1

## 2012-01-24 MED ORDER — MOXIFLOXACIN HCL IN NACL 400 MG/250ML IV SOLN
400.0000 mg | Freq: Once | INTRAVENOUS | Status: AC
  Administered 2012-01-24: 400 mg via INTRAVENOUS
  Filled 2012-01-24: qty 250

## 2012-01-24 MED ORDER — WARFARIN SODIUM 5 MG PO TABS
5.0000 mg | ORAL_TABLET | Freq: Once | ORAL | Status: AC
  Administered 2012-01-24: 5 mg via ORAL
  Filled 2012-01-24: qty 1

## 2012-01-24 MED ORDER — ONDANSETRON HCL 4 MG/2ML IJ SOLN
4.0000 mg | Freq: Four times a day (QID) | INTRAMUSCULAR | Status: DC | PRN
  Administered 2012-01-28: 4 mg via INTRAVENOUS
  Filled 2012-01-24: qty 2

## 2012-01-24 MED ORDER — INFLUENZA VIRUS VACC SPLIT PF IM SUSP
0.5000 mL | INTRAMUSCULAR | Status: AC
  Administered 2012-01-25: 0.5 mL via INTRAMUSCULAR
  Filled 2012-01-24: qty 0.5

## 2012-01-24 MED ORDER — ACETAMINOPHEN 325 MG PO TABS: 650.0000 mg | ORAL_TABLET | Freq: Three times a day (TID) | ORAL | Status: DC | PRN

## 2012-01-24 MED ORDER — PANTOPRAZOLE SODIUM 40 MG PO TBEC
40.0000 mg | DELAYED_RELEASE_TABLET | Freq: Every day | ORAL | Status: DC
  Administered 2012-01-25 – 2012-01-29 (×5): 40 mg via ORAL
  Filled 2012-01-24 (×5): qty 1

## 2012-01-24 MED ORDER — PREDNISONE 50 MG PO TABS
50.0000 mg | ORAL_TABLET | Freq: Four times a day (QID) | ORAL | Status: AC
  Administered 2012-01-24 – 2012-01-25 (×3): 50 mg via ORAL
  Filled 2012-01-24 (×3): qty 1

## 2012-01-24 MED ORDER — ISOSORBIDE MONONITRATE ER 30 MG PO TB24
30.0000 mg | ORAL_TABLET | Freq: Every day | ORAL | Status: DC
  Administered 2012-01-25 – 2012-01-29 (×5): 30 mg via ORAL
  Filled 2012-01-24 (×5): qty 1

## 2012-01-24 MED ORDER — ATENOLOL 100 MG PO TABS
100.0000 mg | ORAL_TABLET | Freq: Every day | ORAL | Status: DC
  Administered 2012-01-24 – 2012-01-28 (×5): 100 mg via ORAL
  Filled 2012-01-24 (×6): qty 1

## 2012-01-24 MED ORDER — SODIUM CHLORIDE 0.9 % IJ SOLN
3.0000 mL | Freq: Two times a day (BID) | INTRAMUSCULAR | Status: DC
  Administered 2012-01-24: 10 mL via INTRAVENOUS
  Administered 2012-01-25 – 2012-01-26 (×3): 3 mL via INTRAVENOUS
  Administered 2012-01-27: 10 mL via INTRAVENOUS
  Administered 2012-01-27 – 2012-01-29 (×3): 3 mL via INTRAVENOUS

## 2012-01-24 MED ORDER — INSULIN ASPART 100 UNIT/ML ~~LOC~~ SOLN
0.0000 [IU] | Freq: Every day | SUBCUTANEOUS | Status: DC
  Administered 2012-01-25: 2 [IU] via SUBCUTANEOUS
  Administered 2012-01-26: 5 [IU] via SUBCUTANEOUS
  Administered 2012-01-27 – 2012-01-28 (×2): 2 [IU] via SUBCUTANEOUS

## 2012-01-24 MED ORDER — DIPHENHYDRAMINE HCL 25 MG PO CAPS
50.0000 mg | ORAL_CAPSULE | Freq: Once | ORAL | Status: AC
  Administered 2012-01-24: 50 mg via ORAL
  Filled 2012-01-24: qty 2

## 2012-01-24 MED ORDER — WARFARIN VIDEO
Freq: Once | Status: AC
  Administered 2012-01-25: 15:00:00

## 2012-01-24 MED ORDER — ALBUTEROL SULFATE (5 MG/ML) 0.5% IN NEBU
5.0000 mg | INHALATION_SOLUTION | Freq: Once | RESPIRATORY_TRACT | Status: AC
  Administered 2012-01-24: 5 mg via RESPIRATORY_TRACT
  Filled 2012-01-24: qty 1

## 2012-01-24 MED ORDER — ENOXAPARIN SODIUM 120 MG/0.8ML ~~LOC~~ SOLN
120.0000 mg | Freq: Two times a day (BID) | SUBCUTANEOUS | Status: DC
  Administered 2012-01-25 – 2012-01-29 (×9): 120 mg via SUBCUTANEOUS
  Filled 2012-01-24 (×12): qty 0.8

## 2012-01-24 MED ORDER — HYDRALAZINE HCL 25 MG PO TABS
25.0000 mg | ORAL_TABLET | Freq: Every morning | ORAL | Status: DC
  Filled 2012-01-24: qty 1

## 2012-01-24 MED ORDER — LEVOFLOXACIN IN D5W 500 MG/100ML IV SOLN
500.0000 mg | INTRAVENOUS | Status: DC
  Administered 2012-01-24 – 2012-01-28 (×5): 500 mg via INTRAVENOUS
  Filled 2012-01-24 (×7): qty 100

## 2012-01-24 MED ORDER — WARFARIN - PHARMACIST DOSING INPATIENT: Freq: Every day | Status: DC

## 2012-01-24 MED ORDER — SIMVASTATIN 10 MG PO TABS
10.0000 mg | ORAL_TABLET | Freq: Every day | ORAL | Status: DC
  Administered 2012-01-25 – 2012-01-28 (×4): 10 mg via ORAL
  Filled 2012-01-24 (×5): qty 1

## 2012-01-24 MED ORDER — ONDANSETRON HCL 4 MG PO TABS
4.0000 mg | ORAL_TABLET | Freq: Four times a day (QID) | ORAL | Status: DC | PRN
  Administered 2012-01-29: 4 mg via ORAL
  Filled 2012-01-24: qty 1

## 2012-01-24 NOTE — H&P (Signed)
[facility] History and Physical  [name] [name] [id number] DOB: [birth year] DOA: 01/24/2012   PCP: Dr Jeanie Cooks Specialists: [facility]  Chief Complaint:  Worsening shortness of breath since 2 days.   HPI: [name] [name] is a 71 y.o. female with h/o Hypertension, COD, diabetes Mellitus, Chronic diastolic heart failure, came in today for shortness of breath for more than 4 weeks , worsened over the last 2 days, associated with dry cough. No fever or chills. Has orthopnea and palpitations . No syncope. On arrival she was found to be tachycardic with HR in 130's, tachypneic, and  hypoxemic requiring 4 liters of oxygen. She at baseline uses 2lit of nasal oxygen at home for COPD. ER physician asked [place] to admit the patient for early pneumonia. Since she was tachycardic, tachypneic, and hypoxic, and she was on tamoxifen, had a recent car travel to [place] and back 4 weeks ago, and has a family h/o blood clots,  we got a CT angio gram of the chest, she was found to have multiple PE. She was started on anticoagulation and admitted to step down.    Review of Systems: The patient denies anorexia, fever, weight loss,, vision loss, decreased hearing, hoarseness, chest pain, syncope, , peripheral edema, balance deficits, hemoptysis, abdominal pain, melena, hematochezia, severe indigestion/heartburn, hematuria, incontinence, genital sores, muscle weakness, suspicious skin lesions, transient blindness, difficulty walking, depression, unusual weight change, abnormal bleeding, enlarged lymph nodes, angioedema, and breast masses.    Past Medical History  Diagnosis Date  . COPD (chronic obstructive pulmonary disease)   . Diabetes mellitus without complication   . CHF (congestive heart failure)   . Lupus   . Cancer     breast cancer   Past Surgical History  Procedure Date  . Mastectomy     right side  . Abdominal hysterectomy 1976  . Exploratory laparotomy   . Back surgery    Social History:  reports that  she has never smoked. She has never used smokeless tobacco. She reports that she does not drink alcohol or use illicit drugs.  where does patient live--home, by herself  Can patient participate in ADLs? Yes.  Allergies  Allergen Reactions  . Penicillins Other (See Comments)    Swells up  . Peach (Prunus Persica) Hives  . Shellfish Allergy Hives    History reviewed. No pertinent family history.   Prior to Admission medications   Medication Sig Start Date End Date Taking? Authorizing Provider  acetaminophen (TYLENOL) 650 MG CR tablet Take 650 mg by mouth every 8 (eight) hours as needed. pain   Yes Historical Provider, MD  albuterol (PROVENTIL HFA;VENTOLIN HFA) 108 (90 BASE) MCG/ACT inhaler Inhale 2 puffs into the lungs 2 (two) times daily.   Yes Historical Provider, MD  aspirin 325 MG tablet Take 325 mg by mouth every morning.   Yes Historical Provider, MD  atenolol (TENORMIN) 100 MG tablet Take 100 mg by mouth at bedtime.   Yes Historical Provider, MD  budesonide-formoterol (SYMBICORT) 160-4.5 MCG/ACT inhaler Inhale 2 puffs into the lungs 2 (two) times daily.   Yes Historical Provider, MD  clobetasol ointment (TEMOVATE) [id number] % Apply 1 application topically daily.    Yes Historical Provider, MD  desonide (DESOWEN) 0.05 % cream Apply 1 application topically daily.    Yes Historical Provider, MD  dexlansoprazole (DEXILANT) 60 MG capsule Take 60 mg by mouth 2 (two) times daily.   Yes Historical Provider, MD  diclofenac sodium (VOLTAREN) 1 % GEL Apply 1 application topically daily.  Yes Historical Provider, MD  diltiazem (TIAZAC) 300 MG 24 hr capsule Take 300 mg by mouth every morning.   Yes Historical Provider, MD  enalapril (VASOTEC) 20 MG tablet Take 20 mg by mouth every morning.   Yes Historical Provider, MD  fluocinonide cream (LIDEX) [id number] % Apply 1 application topically 2 (two) times daily.    Yes Historical Provider, MD  furosemide (LASIX) 40 MG tablet Take 40 mg by mouth every  morning.   Yes Historical Provider, MD  hydrALAZINE (APRESOLINE) 25 MG tablet Take 25 mg by mouth every morning.   Yes Historical Provider, MD  isosorbide mononitrate (IMDUR) 30 MG 24 hr tablet Take 30 mg by mouth every morning.   Yes Historical Provider, MD  pravastatin (PRAVACHOL) 40 MG tablet Take 40 mg by mouth every morning.   Yes Historical Provider, MD  tamoxifen (NOLVADEX) 20 MG tablet Take 20 mg by mouth every morning.   Yes Historical Provider, MD   Physical Exam: Filed Vitals:   01/24/12 1400 01/24/12 1402 01/24/12 1412 01/24/12 1452  BP:   121/95   Pulse:  129 128   Temp:      Resp:  24 16   SpO2: 98% 99% 99% 94%    Constitutional: Vital signs reviewed.  Patient is a well-developed and well-nourished in MILD to mod distress and cooperative with exam. Alert and oriented x3.  Head: Normocephalic and atraumatic Mouth: no erythema or exudates, MMM Eyes: PERRL, EOMI, conjunctivae normal, No scleral icterus.  Neck: Supple, Trachea midline normal ROM, No JVD, mass, thyromegaly, or carotid bruit present.  Cardiovascular: tachycardic, S1 normal, S2 normal, no MRG, pulses symmetric and intact bilaterally Pulmonary/Chest: tachypnic, decreased air entry at bases, no wheezes, rales, or rhonchi Abdominal: Soft. Non-tender, non-distended, bowel sounds are normal, no masses, organomegaly, or guarding present.  Musculoskeletal: No joint deformities, erythema, or stiffness, ROM full and no nontender Neurological: A&O x3, Strength is normal and symmetric bilaterally, cranial nerve II-XII are grossly intact, no focal motor deficit, sensory intact to light touch bilaterally.  Skin: Warm, dry and intact. No rash, cyanosis, or clubbing.  Psychiatric: Normal mood and affect. speech and behavior is normal.  Labs on Admission:  Basic Metabolic Panel:  Lab 0000000 1350  NA 138  K 3.9  CL 101  CO2 24  GLUCOSE 261*  BUN 11  CREATININE 0.95  CALCIUM 9.4  MG --  PHOS --   Liver Function  Tests:  Lab 01/24/12 1350  AST 21  ALT 20  ALKPHOS 74  BILITOT 0.3  PROT 7.7  ALBUMIN 3.3*   No results found for this basename: LIPASE:5,AMYLASE:5 in the last 168 hours No results found for this basename: AMMONIA:5 in the last 168 hours CBC:  Lab 01/24/12 1350  WBC 11.3*  NEUTROABS 8.8*  HGB 11.9*  HCT 35.2*  MCV 84.8  PLT 250   Cardiac Enzymes:  Lab 01/24/12 1350  CKTOTAL --  CKMB --  CKMBINDEX --  TROPONINI <0.30    BNP (last 3 results) No results found for this basename: PROBNP:3 in the last 8760 hours CBG:  Lab 01/24/12 1708 01/24/12 1330  GLUCAP 225* 257*    Radiological Exams on Admission: Dg Chest 1 View  01/24/2012  *RADIOLOGY REPORT*  Clinical Data: Cough, fever - sob - hx asthma  CHEST - 1 VIEW  Comparison: 01/27/2010; 01/25/2010  Findings: Heart size within normal limits for projection. Indistinct pulmonary vasculature noted. Technical factors related to patient body habitus reduce diagnostic sensitivity and specificity.  No definite airspace opacity observed.  Degenerative glenohumeral arthropathy noted bilaterally.  IMPRESSION:  1.  Mildly indistinct pulmonary vasculature is nonspecific on this semi erect view, but could indicate pulmonary venous hypertension. No overt edema or overt cardiomegaly.  No airspace opacity noted.   Original Report Authenticated By: Carron Curie, M.D.     EKG: tachycardic, RBBB at 128/min  Assessment/Plan Active Problems:  1. Shortness of breath: secondary to PE - admit to step down, for closer monitoring. - she is hypoxemic requiring 4 to 5 liters of oxygen - she was started on Lovenox and coumadin.  - venous dopplers to evaluate for DVT - EKG shows RBBB, not sure if its new or old.  - would call cardiology consult from [facility] for further recommendations. - Echo ordered to evaluate right heart strain and pulmonary pressures. -  Serial troponins  2. Chronic diastolic heart failure: - not in failure right  now - resume po lasix - pro bnp ordered,   3. Diabetes Mellitus: -  CBG (last 3)   Basename 01/24/12 1708 01/24/12 1330  GLUCAP 225* 257*   continue with SSI.  4. Anemia:  - normocytic. Anemia panel pending.   5. Hypertension: - not well controlled.  - resume home medications.   6. DVT prophylaxis: on therapeutic anticoagulation.  Code Status: full code Family Communication: sister at bedside Disposition Plan: atleast 2 days    [facility] Pager [phone]  If 7PM-7AM, please contact night-coverage [email or web address] Password [email or web address] 01/24/2012, 5:19 PM

## 2012-01-24 NOTE — Progress Notes (Signed)
Attempted to [facility] for report. Awaiting return call.

## 2012-01-24 NOTE — ED Notes (Signed)
Returned phone call to take report, was put on hold by Network engineer and no one came to the phone. Will await return call for report.

## 2012-01-24 NOTE — ED Notes (Signed)
Report given to [place] c. rn

## 2012-01-24 NOTE — Progress Notes (Addendum)
ANTICOAGULATION CONSULT NOTE - Initial Consult  Pharmacy Consult for Lovenox Indication: suspected pulmonary embolism   Allergies  Allergen Reactions  . Penicillins Other (See Comments)    Swells up  . Peach (Prunus Persica) Hives  . Shellfish Allergy Hives    Patient Measurements:   Vital Signs: Temp: 100.6 F (38.1 C) (10/08 1330) BP: 121/95 mmHg (10/08 1412) Pulse Rate: 128  (10/08 1412)  Labs:  Basename 01/24/12 1350  HGB 11.9*  HCT 35.2*  PLT 250  APTT --  LABPROT --  INR --  HEPARINUNFRC --  CREATININE 0.95  CKTOTAL --  CKMB --  TROPONINI <0.30    CrCl is unknown because there is no height on file for the current visit.   Medical History: Past Medical History  Diagnosis Date  . COPD (chronic obstructive pulmonary disease)   . Diabetes mellitus without complication   . CHF (congestive heart failure)   . Lupus   . Cancer     breast cancer    Medications:  Scheduled:    . acetaminophen  650 mg Oral Once  . albuterol  5 mg Nebulization Once  . albuterol  5 mg Nebulization Once  . diphenhydrAMINE  50 mg Oral Once  . furosemide  40 mg Intravenous Q12H  . influenza  inactive virus vaccine  0.5 mL Intramuscular Tomorrow-1000  . insulin aspart  0-5 Units Subcutaneous QHS  . insulin aspart  0-9 Units Subcutaneous TID WC  . ipratropium  0.5 mg Nebulization Once  . ipratropium  0.5 mg Nebulization Once  . moxifloxacin  400 mg Intravenous Once  . predniSONE  50 mg Oral Q6H  . predniSONE  60 mg Oral Once  . sodium chloride  500 mL Intravenous Once   Infusions:   PRN: iohexol  Assessment:  71 yo F presented to ER with SOB with suspected PE.  Weight 117 kg, SCr wnl.  CBC okay.  Starting Lovenox and coumadin.   Drug interaction: Levaquin - concurrent therapy can increase INR  Will order baseline INR, please obtain prior to first dose coumadin  Goal of Therapy:  Anti-Xa level 0.6-1.2 units/ml 4hrs after LMWH dose given Monitor platelets by  anticoagulation protocol: Yes   Plan:  1.) Lovenox 120 mg SQ Q12h, first dose now 2.) CBC Q72h 3.) Monitor renal function  4.) Coumadin 5 mg po x 1 at 2000  5.) Daily PT/INR 6.) Coumadin book/video/education   Charrie Mcconnon, Gaye Alken PharmD Pager #: [phone] 6:10 PM 01/24/2012

## 2012-01-24 NOTE — ED Notes (Signed)
Attempted to call report, awaiting rn to return call

## 2012-01-24 NOTE — ED Provider Notes (Signed)
History     CSN: [id number]  Arrival date & time 01/24/12  1327   First MD Initiated Contact with Patient 01/24/12 1340      Chief Complaint  Patient presents with  . Shortness of Breath    (Consider location/radiation/quality/duration/timing/severity/associated sxs/prior treatment) Patient is a 71 y.o. female presenting with shortness of breath. The history is provided by the patient.  Shortness of Breath  The current episode started yesterday. The onset was gradual. The problem occurs continuously. The problem has been rapidly worsening. The problem is severe. Nothing relieves the symptoms. The symptoms are aggravated by activity. Associated symptoms include chest pressure, a fever, cough, shortness of breath and wheezing. Pertinent negatives include no chest pain. Cough associated with: cough started 1 week ago. The cough is non-productive. The cough is worsened by activity. She has had intermittent steroid use. She has had prior hospitalizations. She has had no prior ICU admissions. She has had no prior intubations. Past medical history comments: COPD. Urine output has been normal. There were no sick contacts. She has received no recent medical care.    Past Medical History  Diagnosis Date  . COPD (chronic obstructive pulmonary disease)   . Diabetes mellitus without complication   . CHF (congestive heart failure)   . Lupus   . Cancer     breast cancer    Past Surgical History  Procedure Date  . Mastectomy     right side    History reviewed. No pertinent family history.  History  Substance Use Topics  . Smoking status: Never Smoker   . Smokeless tobacco: Not on file  . Alcohol Use: No    OB History    Grav Para Term Preterm Abortions TAB SAB Ect Mult Living                  Review of Systems  Constitutional: Positive for fever and chills.  Respiratory: Positive for cough, shortness of breath and wheezing.   Cardiovascular: Negative for chest pain and leg  swelling.  Gastrointestinal: Negative for nausea, vomiting, abdominal pain and diarrhea.  All other systems reviewed and are negative.    Allergies  Review of patient's allergies indicates not on file.  Home Medications  No current outpatient prescriptions on file.  BP 121/95  Pulse 128  Temp 100.6 F (38.1 C)  Resp 16  SpO2 99%  Physical Exam  Nursing note and vitals reviewed. Constitutional: She is oriented to person, place, and time. She appears well-developed and well-nourished. She appears distressed.  HENT:  Head: Normocephalic and atraumatic.  Mouth/Throat: Oropharynx is clear and moist.  Eyes: Conjunctivae normal and EOM are normal. Pupils are equal, round, and reactive to light.  Neck: Normal range of motion. Neck supple.  Cardiovascular: Regular rhythm and intact distal pulses.  Tachycardia present.   No murmur heard. Pulmonary/Chest: Tachypnea noted. No respiratory distress. She has wheezes. She has no rales.  Abdominal: Soft. She exhibits no distension. There is no tenderness. There is no rebound and no guarding.  Musculoskeletal: Normal range of motion. She exhibits no edema and no tenderness.  Neurological: She is alert and oriented to person, place, and time.  Skin: Skin is warm and dry. No rash noted. No erythema.  Psychiatric: She has a normal mood and affect. Her behavior is normal.    ED Course  Procedures (including critical care time)  Labs Reviewed  GLUCOSE, CAPILLARY - Abnormal; Notable for the following:    Glucose-Capillary 257 (*)  All other components within normal limits  COMPREHENSIVE METABOLIC PANEL - Abnormal; Notable for the following:    Glucose, Bld 261 (*)     Albumin 3.3 (*)     GFR calc non Af Amer 59 (*)     GFR calc Af Amer 69 (*)     All other components within normal limits  CBC WITH DIFFERENTIAL - Abnormal; Notable for the following:    WBC 11.3 (*)     Hemoglobin 11.9 (*)     HCT 35.2 (*)     Neutrophils Relative 78  (*)     Neutro Abs 8.8 (*)     All other components within normal limits  LACTIC ACID, PLASMA  TROPONIN I  URINALYSIS, ROUTINE W REFLEX MICROSCOPIC   Dg Chest 1 View  01/24/2012  *RADIOLOGY REPORT*  Clinical Data: Cough, fever - sob - hx asthma  CHEST - 1 VIEW  Comparison: 01/27/2010; 01/25/2010  Findings: Heart size within normal limits for projection. Indistinct pulmonary vasculature noted. Technical factors related to patient body habitus reduce diagnostic sensitivity and specificity.  No definite airspace opacity observed.  Degenerative glenohumeral arthropathy noted bilaterally.  IMPRESSION:  1.  Mildly indistinct pulmonary vasculature is nonspecific on this semi erect view, but could indicate pulmonary venous hypertension. No overt edema or overt cardiomegaly.  No airspace opacity noted.   Original Report Authenticated By: Carron Curie, M.D.      Date: 01/24/2012  Rate: 136  Rhythm: sinus tachycardia  QRS Axis: normal  Intervals: normal  ST/T Wave abnormalities: nonspecific ST/T changes  Conduction Disutrbances:right bundle branch block  Narrative Interpretation:   Old EKG Reviewed: unchanged   1. COPD (chronic obstructive pulmonary disease)   2. CAP (community acquired pneumonia)       MDM  Pt presented in respiratory distress with a history of COPD, diabetes, CHF and breast cancer that is resolved. Patient typically wears 2 L of oxygen at home however she ran out of her oxygen when she arrived she was on room air and her O2 sats were 81%. After placement on 2 L of oxygen patient was found to be tachycardic at 120s with a temperature of 100.6 orally as well as tachypnea and O2 sats of 93%. Patient is wheezing diffusely on exam but there are no signs of fluid overload today. Concern for possible infectious etiology versus COPD exacerbation. CBC, CMP, UA, lactic acid, troponin pending. EKG showed sinus tachycardia with a persistent right bundle branch block.  Patient  started on albuterol and Atrovent to see if symptoms improve and was given Tylenol for her fever.  3:52 PM Cytosis but the rest of her labs are stable. Chest x-ray without focal consolidation at this time. Patient on reevaluation continues to wheeze but is improved. Patient given a second dose of albuterol, Atrovent, steroids and antibiotics. Patient will be admitted for further care.      Blanchie Dessert, MD 01/24/12 [phone]

## 2012-01-24 NOTE — ED Notes (Signed)
Attempted to call report, awaiting on rn to return call

## 2012-01-24 NOTE — ED Notes (Signed)
Cough x1 week. Sob last night, increased today. Oxygen tank was uncharged, so pt was without oxygen for at least 30 min. Unable to speak full sentences.

## 2012-01-24 NOTE — ED Notes (Signed)
Admitting MD at bedside.

## 2012-01-24 NOTE — ED Notes (Signed)
Family at bedside. 

## 2012-01-25 DIAGNOSIS — I2699 Other pulmonary embolism without acute cor pulmonale: Secondary | ICD-10-CM

## 2012-01-25 HISTORY — PX: DOPPLER ECHOCARDIOGRAPHY: SHX263

## 2012-01-25 HISTORY — PX: OTHER SURGICAL HISTORY: SHX169

## 2012-01-25 LAB — CBC
MCH: 28.5 pg (ref 26.0–34.0)
Platelets: 240 10*3/uL (ref 150–400)
RBC: 4.1 MIL/uL (ref 3.87–5.11)
RDW: 14.9 % (ref 11.5–15.5)
WBC: 9.6 10*3/uL (ref 4.0–10.5)

## 2012-01-25 LAB — GLUCOSE, RANDOM: Glucose, Bld: 414 mg/dL — ABNORMAL HIGH (ref 70–99)

## 2012-01-25 LAB — COMPREHENSIVE METABOLIC PANEL
AST: 20 U/L (ref 0–37)
Alkaline Phosphatase: 61 U/L (ref 39–117)
BUN: 15 mg/dL (ref 6–23)
CO2: 23 mEq/L (ref 19–32)
Chloride: 99 mEq/L (ref 96–112)
Creatinine, Ser: 0.95 mg/dL (ref 0.50–1.10)
GFR calc non Af Amer: 59 mL/min — ABNORMAL LOW (ref >90)
Total Bilirubin: 0.3 mg/dL (ref 0.3–1.2)

## 2012-01-25 LAB — GLUCOSE, CAPILLARY
Glucose-Capillary: 454 mg/dL — ABNORMAL HIGH (ref 70–99)
Glucose-Capillary: 379 mg/dL — ABNORMAL HIGH (ref 70–99)
Glucose-Capillary: 203 mg/dL — ABNORMAL HIGH (ref 70–99)

## 2012-01-25 LAB — URINE CULTURE

## 2012-01-25 LAB — HEMOGLOBIN A1C
Hgb A1c MFr Bld: 10 % — ABNORMAL HIGH (ref <5.7)
Hgb A1c MFr Bld: 9.7 % — ABNORMAL HIGH (ref <5.7)

## 2012-01-25 LAB — TROPONIN I
Troponin I: <0.3 ng/mL (ref <0.30)
Troponin I: <0.3 ng/mL (ref <0.30)

## 2012-01-25 MED ORDER — ALBUTEROL SULFATE (5 MG/ML) 0.5% IN NEBU: 2.5000 mg | INHALATION_SOLUTION | RESPIRATORY_TRACT | Status: DC | PRN

## 2012-01-25 MED ORDER — WARFARIN SODIUM 5 MG PO TABS
5.0000 mg | ORAL_TABLET | Freq: Once | ORAL | Status: AC
  Administered 2012-01-25: 5 mg via ORAL
  Filled 2012-01-25 (×2): qty 1

## 2012-01-25 MED ORDER — IPRATROPIUM BROMIDE 0.02 % IN SOLN
0.5000 mg | Freq: Four times a day (QID) | RESPIRATORY_TRACT | Status: DC
  Administered 2012-01-25 – 2012-01-27 (×10): 0.5 mg via RESPIRATORY_TRACT
  Filled 2012-01-25 (×11): qty 2.5

## 2012-01-25 MED ORDER — INSULIN ASPART 100 UNIT/ML ~~LOC~~ SOLN: 3.0000 [IU] | Freq: Three times a day (TID) | SUBCUTANEOUS | Status: DC

## 2012-01-25 MED ORDER — HYDRALAZINE HCL 20 MG/ML IJ SOLN: 10.0000 mg | Freq: Four times a day (QID) | INTRAMUSCULAR | Status: DC | PRN

## 2012-01-25 MED ORDER — HYDRALAZINE HCL 25 MG PO TABS
25.0000 mg | ORAL_TABLET | Freq: Three times a day (TID) | ORAL | Status: DC
  Administered 2012-01-25 – 2012-01-29 (×13): 25 mg via ORAL
  Filled 2012-01-25 (×15): qty 1

## 2012-01-25 MED ORDER — GLUCERNA SHAKE PO LIQD
237.0000 mL | Freq: Two times a day (BID) | ORAL | Status: DC
  Administered 2012-01-25 – 2012-01-29 (×8): 237 mL via ORAL
  Filled 2012-01-25 (×9): qty 237

## 2012-01-25 MED ORDER — ALBUTEROL SULFATE (5 MG/ML) 0.5% IN NEBU
2.5000 mg | INHALATION_SOLUTION | Freq: Four times a day (QID) | RESPIRATORY_TRACT | Status: DC
  Administered 2012-01-25 – 2012-01-27 (×10): 2.5 mg via RESPIRATORY_TRACT
  Filled 2012-01-25 (×11): qty 0.5

## 2012-01-25 MED ORDER — INSULIN ASPART 100 UNIT/ML ~~LOC~~ SOLN
3.0000 [IU] | Freq: Three times a day (TID) | SUBCUTANEOUS | Status: DC
  Administered 2012-01-25 – 2012-01-26 (×3): 3 [IU] via SUBCUTANEOUS

## 2012-01-25 MED ORDER — INSULIN ASPART 100 UNIT/ML ~~LOC~~ SOLN
0.0000 [IU] | Freq: Three times a day (TID) | SUBCUTANEOUS | Status: DC
  Administered 2012-01-25 (×2): 20 [IU] via SUBCUTANEOUS
  Administered 2012-01-26: 7 [IU] via SUBCUTANEOUS
  Administered 2012-01-26: 11 [IU] via SUBCUTANEOUS
  Administered 2012-01-26: 7 [IU] via SUBCUTANEOUS
  Administered 2012-01-27: 15 [IU] via SUBCUTANEOUS
  Administered 2012-01-27: 20 [IU] via SUBCUTANEOUS
  Administered 2012-01-27: 11 [IU] via SUBCUTANEOUS
  Administered 2012-01-28: 4 [IU] via SUBCUTANEOUS
  Administered 2012-01-28: 15 [IU] via SUBCUTANEOUS
  Administered 2012-01-28: 7 [IU] via SUBCUTANEOUS
  Administered 2012-01-29: 4 [IU] via SUBCUTANEOUS

## 2012-01-25 NOTE — Progress Notes (Signed)
CARE MANAGEMENT NOTE 01/25/2012  Patient:  [name] [name], [name] [name]   Account Number:  [id number]  Date Initiated:  01/25/2012  Documentation initiated by:  DAVIS,RHONDA  Subjective/Objective Assessment:   dyspnea     Action/Plan:   snf   Anticipated DC Date:  01/28/2012   Anticipated DC Plan:  SKILLED NURSING FACILITY  In-house referral  Clinical Social Worker      DC Planning Services  NA      [facility] Choice  NA   Choice offered to / List presented to:  NA   DME arranged  NA      DME agency  NA     [place] arranged  NA      Gas agency  NA   Status of service:  In process, will continue to follow Medicare Important Message given?  NA - LOS <3 / Initial given by admissions (If response is "NO", the following Medicare IM given date fields will be blank) Date Medicare IM given:   Date Additional Medicare IM given:    Discharge Disposition:    Per UR Regulation:  Reviewed for med. necessity/level of care/duration of stay  If discussed at [place] of Stay Meetings, dates discussed:    Comments:  [id number] Rosana Hoes, RN, BSN, CCM: CHART REVIEWED AND UPDATED. NO DISCHARGE NEEDS PRESENT AT THIS TIME. CASE MANAGEMENT [id number]

## 2012-01-25 NOTE — Progress Notes (Signed)
BP 194/91. On call provider notified. No orders received. Will continue to monitor.

## 2012-01-25 NOTE — Progress Notes (Signed)
INITIAL ADULT NUTRITION ASSESSMENT Date: 01/25/2012   Time: 12:49 PM Reason for Assessment: Nutrition Risk  ASSESSMENT: Female 71 y.o.  Dx: Pulmonary embolism  Hx:  Past Medical History  Diagnosis Date  . COPD (chronic obstructive pulmonary disease)   . Diabetes mellitus without complication   . CHF (congestive heart failure)   . Lupus   . Cancer     breast cancer    Related Meds:  Scheduled Meds:   . acetaminophen  650 mg Oral Once  . albuterol  2.5 mg Nebulization Q6H  . albuterol  5 mg Nebulization Once  . albuterol  5 mg Nebulization Once  . aspirin  325 mg Oral q morning - 10a  . atenolol  100 mg Oral QHS  . budesonide-formoterol  2 puff Inhalation BID  . coumadin book   Does not apply Once  . diltiazem  300 mg Oral q morning - 10a  . diphenhydrAMINE  50 mg Oral Once  . enalapril  20 mg Oral q morning - 10a  . enoxaparin (LOVENOX) injection  120 mg Subcutaneous Once  . enoxaparin (LOVENOX) injection  120 mg Subcutaneous Q12H  . furosemide  40 mg Oral Daily  . hydrALAZINE  25 mg Oral Q8H  . influenza  inactive virus vaccine  0.5 mL Intramuscular Tomorrow-1000  . insulin aspart  0-20 Units Subcutaneous TID WC  . insulin aspart  0-5 Units Subcutaneous QHS  . insulin aspart  12 Units Subcutaneous Once  . ipratropium  0.5 mg Nebulization Once  . ipratropium  0.5 mg Nebulization Once  . ipratropium  0.5 mg Nebulization Q6H  . isosorbide mononitrate  30 mg Oral Daily  . levofloxacin (LEVAQUIN) IV  500 mg Intravenous Q24H  . moxifloxacin  400 mg Intravenous Once  . pantoprazole  40 mg Oral Q1200  . predniSONE  50 mg Oral Q6H  . predniSONE  60 mg Oral Once  . simvastatin  10 mg Oral q1800  . sodium chloride  500 mL Intravenous Once  . sodium chloride  3 mL Intravenous Q12H  . warfarin  5 mg Oral Once  . warfarin  5 mg Oral ONCE-1800  . warfarin   Does not apply Once  . Warfarin - Pharmacist Dosing Inpatient   Does not apply q1800  . DISCONTD: albuterol  2 puff  Inhalation BID  . DISCONTD: furosemide  40 mg Intravenous Q12H  . DISCONTD: hydrALAZINE  25 mg Oral q morning - 10a  . DISCONTD: insulin aspart  0-9 Units Subcutaneous TID WC   Continuous Infusions:  PRN Meds:.acetaminophen, albuterol, hydrALAZINE, iohexol, ondansetron (ZOFRAN) IV, ondansetron, oxyCODONE, DISCONTD: acetaminophen   Ht: 5\' 6"  (167.6 cm)  Wt: 264 lb 15.9 oz (120.2 kg)  Ideal Wt: 62 kg % Ideal Wt: 194% Wt Readings from Last 10 Encounters:  01/25/12 264 lb 15.9 oz (120.2 kg)    Usual Wt: 260 lb with fluid accumulation in August per pt.  % Usual Wt: 101%  Body mass index is 42.77 kg/(m^2). (Extreme Obesity class III)  Food/Nutrition Related Hx: Patient reported her appetite has not been well for the past couple of days. She reported she does not always eat three meals a day, sometimes one. She reported she has dentures. She also stated she drinks one to two glucerna nutrition supplements daily and occasionally some nutri-system.   Labs:  CMP     Component Value Date/Time   NA 134* 01/25/2012 0655   K 4.8 01/25/2012 0655   CL 99 01/25/2012  0655   CO2 23 01/25/2012 0655   GLUCOSE 388* 01/25/2012 0655   BUN 15 01/25/2012 0655   CREATININE 0.95 01/25/2012 0655   CALCIUM 8.9 01/25/2012 0655   PROT 7.4 01/25/2012 0655   ALBUMIN 2.9* 01/25/2012 0655   AST 20 01/25/2012 0655   ALT 18 01/25/2012 0655   ALKPHOS 61 01/25/2012 0655   BILITOT 0.3 01/25/2012 0655   GFRNONAA 59* 01/25/2012 0655   GFRAA 69* 01/25/2012 0655     Intake/Output Summary (Last 24 hours) at 01/25/12 1300 Last data filed at 01/25/12 1200  Gross per 24 hour  Intake    483 ml  Output   2300 ml  Net  -1817 ml     Diet Order: Carb Control  Supplements/Tube Feeding: none a this time   IVF:    Estimated Nutritional Needs:   Kcal: [id number] Protein: 132-156 grams  Fluid: 1 ml per kcal intake   NUTRITION DIAGNOSIS: -Inadequate oral intake (NI-2.1).  Status: Ongoing  RELATED TO: poor appetite   AS  EVIDENCE BY: pt reported poor PO intake of 1 to 2 meals daily.   MONITORING/EVALUATION(Goals): PO intake, weights, labs 1. PO intake > 75% at meals and supplements.   EDUCATION NEEDS: -No education needs identified at this time  INTERVENTION: 1. Will order patient Glucerna BID, provides 440 kcal and 20 grams of protein daily.  2. RD to follow for nutrition plan of care.   Dietitian [phone]  DOCUMENTATION CODES Per approved criteria  -Morbid Obesity    Loyce Dys [facility] 01/25/2012, 12:49 PM

## 2012-01-25 NOTE — Progress Notes (Signed)
Patient ID: [name] [name]  female  [id number]    DOB: 09-19-1940    DOA: 01/24/2012  PCP: Philis Fendt, MD  Subjective: Feels somewhat better today. No chest pain, nausea, vomiting and abdominal pain. + Expiratory wheezing  Objective: Weight change:   Intake/Output Summary (Last 24 hours) at 01/25/12 1252 Last data filed at 01/25/12 1200  Gross per 24 hour  Intake    483 ml  Output   2300 ml  Net  -1817 ml   Blood pressure 148/65, pulse 92, temperature 97.8 F (36.6 C), temperature source Oral, resp. rate 19, height 5\' 6"  (1.676 m), weight 120.2 kg (264 lb 15.9 oz), SpO2 97.00%.  Physical Exam: General: Alert and awake, oriented x3, not in any acute distress. HEENT: anicteric sclera, pupils reactive to light and accommodation, EOMI CVS: S1-S2 clear, no murmur rubs or gallops Chest: Bilateral scattered expiratory wheezing Abdomen: soft nontender, nondistended, normal bowel sounds, no organomegaly Extremities: no cyanosis, clubbing or edema noted bilaterally Neuro: Cranial nerves II-XII intact, no focal neurological deficits  Lab Results: Basic Metabolic Panel:  Lab [id number] 0655 01/24/12 1350  NA 134* 138  K 4.8 3.9  CL 99 101  CO2 23 24  GLUCOSE 388* 261*  BUN 15 11  CREATININE 0.95 0.95  CALCIUM 8.9 9.4  MG -- --  PHOS -- --   Liver Function Tests:  Lab 01/25/12 0655 01/24/12 1350  AST 20 21  ALT 18 20  ALKPHOS 61 74  BILITOT 0.3 0.3  PROT 7.4 7.7  ALBUMIN 2.9* 3.3*   CBC:  Lab 01/25/12 0655 01/24/12 1350  WBC 9.6 11.3*  NEUTROABS -- 8.8*  HGB 11.7* 11.9*  HCT 34.7* 35.2*  MCV 84.6 84.8  PLT 240 250   Cardiac Enzymes:  Lab 01/25/12 0655 01/25/12 0112 01/24/12 1920  CKTOTAL -- -- --  CKMB -- -- --  CKMBINDEX -- -- --  TROPONINI <0.30 <0.30 <0.30   CBG:  Lab 01/25/12 1147 01/25/12 0817 01/24/12 2244 01/24/12 1708 01/24/12 1330  GLUCAP 454* 356* 431* 225* 257*     Micro Results: Recent Results (from the past 240 hour(s))  MRSA PCR  SCREENING     Status: Normal   Collection Time   01/24/12  8:17 PM      Component Value Range Status Comment   MRSA by PCR NEGATIVE  NEGATIVE Final     Studies/Results: Dg Chest 1 View  01/24/2012    IMPRESSION:  1.  Mildly indistinct pulmonary vasculature is nonspecific on this semi erect view, but could indicate pulmonary venous hypertension. No overt edema or overt cardiomegaly.  No airspace opacity noted.   Original Report Authenticated By: Carron Curie, M.D.    Ct Angio Chest Pe W/cm &/or Wo Cm  01/24/2012  IMPRESSION: Scattered small pulmonary emboli throughout the right lung segmental branches and also suspected in the left lower lobe segmental vasculature.  Mild left upper lobe ground-glass opacity nonspecific but suspect mild alveolitis.  Bibasilar atelectasis.   Original Report Authenticated By: Jerilynn Mages. Daryll Brod, M.D.     Medications: Scheduled Meds:   . acetaminophen  650 mg Oral Once  . albuterol  2.5 mg Nebulization Q6H  . albuterol  5 mg Nebulization Once  . albuterol  5 mg Nebulization Once  . aspirin  325 mg Oral q morning - 10a  . atenolol  100 mg Oral QHS  . budesonide-formoterol  2 puff Inhalation BID  . coumadin book   Does not apply Once  .  diltiazem  300 mg Oral q morning - 10a  . diphenhydrAMINE  50 mg Oral Once  . enalapril  20 mg Oral q morning - 10a  . enoxaparin (LOVENOX) injection  120 mg Subcutaneous Once  . enoxaparin (LOVENOX) injection  120 mg Subcutaneous Q12H  . furosemide  40 mg Oral Daily  . hydrALAZINE  25 mg Oral Q8H  . influenza  inactive virus vaccine  0.5 mL Intramuscular Tomorrow-1000  . insulin aspart  0-20 Units Subcutaneous TID WC  . insulin aspart  0-5 Units Subcutaneous QHS  . insulin aspart  12 Units Subcutaneous Once  . insulin aspart  3 Units Subcutaneous TID WC  . ipratropium  0.5 mg Nebulization Once  . ipratropium  0.5 mg Nebulization Once  . ipratropium  0.5 mg Nebulization Q6H  . isosorbide mononitrate  30 mg Oral  Daily  . levofloxacin (LEVAQUIN) IV  500 mg Intravenous Q24H  . moxifloxacin  400 mg Intravenous Once  . pantoprazole  40 mg Oral Q1200  . predniSONE  50 mg Oral Q6H  . predniSONE  60 mg Oral Once  . simvastatin  10 mg Oral q1800  . sodium chloride  500 mL Intravenous Once  . sodium chloride  3 mL Intravenous Q12H  . warfarin  5 mg Oral Once  . warfarin  5 mg Oral ONCE-1800  . warfarin   Does not apply Once  . Warfarin - Pharmacist Dosing Inpatient   Does not apply q1800  . DISCONTD: albuterol  2 puff Inhalation BID  . DISCONTD: furosemide  40 mg Intravenous Q12H  . DISCONTD: hydrALAZINE  25 mg Oral q morning - 10a  . DISCONTD: insulin aspart  0-9 Units Subcutaneous TID WC   Continuous Infusions:    Assessment/Plan: Principal Problem:  *Acute bilateral Pulmonary embolism: ? Unclear if it is due to long-distance car drive vs ca breast or tamoxifen - Doppler ultrasound done today shows no DVT - 2-D echocardiogram done results pending - Continue full dose Lovenox with Coumadin. Patient will need Lovenox teaching.   Active Problems:  Chronic diastolic heart failure: Currently compensated - Continue all cardiac medications, aspirin, Cardizem, BB, Vasotec and Lasix    COPD (chronic obstructive pulmonary disease) exacerbation - Patient has received excessive amount of steroids, will hold off on any further prednisone. Reassess in a.m. if she needs. - Placed on scheduled albuterol and Atrovent nebs, Symbicort, Levaquin and wean off O2 as needed   Diabetes mellitus: Blood sugars uncontrolled secondary to steroids - Obtain hemoglobin A1c, place on meal coverage, resistant sliding scale, Lantus   Hypertension: Continue oral antihypertensives, increase hydralazine to 25mg  TID   Leukocytosis: Improved   Anemia: Stable  DVT Prophylaxis:On full dose Lovenox and Coumadin  Code Status:Presumed Full code  Disposition:Not medically ready   LOS: 1 day   RAI,RIPUDEEP M.D. [facility] 01/25/2012, 12:52 PM Pager: [phone]  If 7PM-7AM, please contact night-coverage [email or web address] Password [email or web address]

## 2012-01-25 NOTE — Progress Notes (Signed)
Pt CBG 454.  Per MAR, ordered STAT blood glucose.  Lab confirmed 414.  MD paged to find out what coverage needed for > 400. [place], [place]

## 2012-01-25 NOTE — Progress Notes (Signed)
Inpatient Diabetes Program Recommendations  AACE/ADA: New Consensus Statement on Inpatient Glycemic Control (2013)  Target Ranges:  Prepandial:   less than 140 mg/dL      Peak postprandial:   less than 180 mg/dL (1-2 hours)      Critically ill patients:  140 - 180 mg/dL   Reason for Visit: Hyperglycemia  71 yo female with h/o Hypertension, COD, diabetes Mellitus, Chronic diastolic heart failure, came in today for shortness of breath for more than 4 weeks , worsened over the last 2 days, associated with dry cough. No fever or chills. No home diabetes meds noted. Results for RESHA, ISHII (MRN [id number]) as of 01/25/2012 17:23  Ref. Range 01/25/2012 06:55  Hemoglobin A1C Latest Range: <5.7 % 9.7 (H)  Results for DAWAN, BETRO (MRN [id number]) as of 01/25/2012 17:23  Ref. Range 01/24/2012 13:30 01/24/2012 17:08 01/24/2012 22:44 01/25/2012 08:17 01/25/2012 11:47 01/25/2012 14:00 01/25/2012 16:48  Glucose-Capillary Latest Range: 70-99 mg/dL 257 (H) 225 (H) 431 (H) 356 (H) 454 (H) 405 (H) 379 (H)  Results for JEARLINE, LAPAN (MRN [id number]) as of 01/25/2012 17:23  Ref. Range 01/25/2012 06:55  Sodium Latest Range: 135-145 mEq/L 134 (L)  Potassium Latest Range: 3.5-5.1 mEq/L 4.8  Chloride Latest Range: 96-112 mEq/L 99  CO2 Latest Range: 19-32 mEq/L 23  Mean Plasma Glucose Latest Range: <117 mg/dL 232 (H)  BUN Latest Range: 6-23 mg/dL 15  Creatinine Latest Range: 0.50-1.10 mg/dL 0.95  Calcium Latest Range: 8.4-10.5 mg/dL 8.9  GFR calc non Af Amer Latest Range: >90 mL/min 59 (L)  GFR calc Af Amer Latest Range: >90 mL/min 69 (L)  Glucose Latest Range: 70-99 mg/dL 388 (H)     HgbA1C of 9.7% reveals poor glycemic control at home.  Recommendations:  Add Lantus 15 units QHS. Increase meal coverage insulin to 6 units tidwc.   Will continue to follow.

## 2012-01-25 NOTE — Progress Notes (Signed)
VASCULAR LAB PRELIMINARY  PRELIMINARY  PRELIMINARY  PRELIMINARY  Bilateral lower extremity venous duplex completed.    Preliminary report:  Bilateral:  No evidence of DVT, superficial thrombosis, or Baker's Cyst.   Piercen Covino, RVS 01/25/2012, 9:18 AM

## 2012-01-25 NOTE — Progress Notes (Signed)
ANTICOAGULATION CONSULT NOTE - Follow up [place] for Lovenox, Warfarin Indication: pulmonary embolism   Allergies  Allergen Reactions  . Penicillins Other (See Comments)    Swells up  . Peach (Prunus Persica) Hives  . Shellfish Allergy Hives    Patient Measurements: 10/9 Weight 120.2kg  Vital Signs: Temp: 97.8 F (36.6 C) (10/09 0800) Temp src: Oral (10/09 0800) BP: 179/88 mmHg (10/09 1029) Pulse Rate: 63  (10/09 0900)  Labs:  Basename 01/25/12 0655 01/25/12 0112 01/24/12 2059 01/24/12 1920 01/24/12 1350  HGB 11.7* -- -- -- 11.9*  HCT 34.7* -- -- -- 35.2*  PLT 240 -- -- -- 250  APTT -- -- -- -- --  LABPROT 13.6 -- 14.3 -- --  INR 1.05 -- 1.13 -- --  HEPARINUNFRC -- -- -- -- --  CREATININE 0.95 -- -- -- 0.95  CKTOTAL -- -- -- -- --  CKMB -- -- -- -- --  TROPONINI <0.30 <0.30 -- <0.30 --    Estimated Creatinine Clearance: 72.8 ml/min (by C-G formula based on Cr of 0.95).   Medical History: Past Medical History  Diagnosis Date  . COPD (chronic obstructive pulmonary disease)   . Diabetes mellitus without complication   . CHF (congestive heart failure)   . Lupus   . Cancer     breast cancer    Medications:  Scheduled:     . acetaminophen  650 mg Oral Once  . albuterol  2.5 mg Nebulization Q6H  . albuterol  5 mg Nebulization Once  . albuterol  5 mg Nebulization Once  . aspirin  325 mg Oral q morning - 10a  . atenolol  100 mg Oral QHS  . budesonide-formoterol  2 puff Inhalation BID  . coumadin book   Does not apply Once  . diltiazem  300 mg Oral q morning - 10a  . diphenhydrAMINE  50 mg Oral Once  . enalapril  20 mg Oral q morning - 10a  . enoxaparin (LOVENOX) injection  120 mg Subcutaneous Once  . enoxaparin (LOVENOX) injection  120 mg Subcutaneous Q12H  . furosemide  40 mg Oral Daily  . hydrALAZINE  25 mg Oral Q8H  . influenza  inactive virus vaccine  0.5 mL Intramuscular Tomorrow-1000  . insulin aspart  0-20 Units Subcutaneous TID  WC  . insulin aspart  0-5 Units Subcutaneous QHS  . insulin aspart  12 Units Subcutaneous Once  . ipratropium  0.5 mg Nebulization Once  . ipratropium  0.5 mg Nebulization Once  . ipratropium  0.5 mg Nebulization Q6H  . isosorbide mononitrate  30 mg Oral Daily  . levofloxacin (LEVAQUIN) IV  500 mg Intravenous Q24H  . moxifloxacin  400 mg Intravenous Once  . pantoprazole  40 mg Oral Q1200  . predniSONE  50 mg Oral Q6H  . predniSONE  60 mg Oral Once  . simvastatin  10 mg Oral q1800  . sodium chloride  500 mL Intravenous Once  . sodium chloride  3 mL Intravenous Q12H  . warfarin  5 mg Oral Once  . warfarin   Does not apply Once  . Warfarin - Pharmacist Dosing Inpatient   Does not apply q1800  . DISCONTD: albuterol  2 puff Inhalation BID  . DISCONTD: furosemide  40 mg Intravenous Q12H  . DISCONTD: hydrALAZINE  25 mg Oral q morning - 10a  . DISCONTD: insulin aspart  0-9 Units Subcutaneous TID WC   Infusions:   PRN: acetaminophen, albuterol, hydrALAZINE, iohexol, ondansetron (ZOFRAN) IV,  ondansetron, oxyCODONE, DISCONTD: acetaminophen  Assessment:  71 yo F presented to ER 10/8 with SOB with suspected PE.  Started on Lovenox and warfarin 10/8.  Doppler negative for DVT.  Drug interaction noted: Levaquin and warfarin concurrent therapy can increase INR  INR subtherapeutic after only one dose as expected.  CBC stable  Goal of Therapy:  INR 2-3 Anti-Xa level 0.6-1.2 units/ml 4hrs after LMWH dose given Monitor platelets by anticoagulation protocol: Yes   Plan:   Continue Lovenox 120 mg SQ Q12h  Repeat warfarin 5mg  PO today at 1800  Daily PT/INR, CBC Q72h, follow renal function  Warfarin education    Gretta Arab PharmD, BCPS Pager [phone] 01/25/2012 12:28 PM

## 2012-01-25 NOTE — Progress Notes (Signed)
  Echocardiogram 2D Echocardiogram has been performed.  [name] [name] 01/25/2012, 9:59 AM

## 2012-01-26 LAB — GLUCOSE, CAPILLARY
Glucose-Capillary: 250 mg/dL — ABNORMAL HIGH (ref 70–99)
Glucose-Capillary: 267 mg/dL — ABNORMAL HIGH (ref 70–99)
Glucose-Capillary: 360 mg/dL — ABNORMAL HIGH (ref 70–99)

## 2012-01-26 LAB — CBC
Platelets: 232 10*3/uL (ref 150–400)
RBC: 3.9 MIL/uL (ref 3.87–5.11)
RDW: 15.2 % (ref 11.5–15.5)
WBC: 13.7 10*3/uL — ABNORMAL HIGH (ref 4.0–10.5)

## 2012-01-26 LAB — BASIC METABOLIC PANEL
CO2: 26 mEq/L (ref 19–32)
Chloride: 101 mEq/L (ref 96–112)
GFR calc Af Amer: 53 mL/min — ABNORMAL LOW (ref >90)
Potassium: 4 mEq/L (ref 3.5–5.1)
Sodium: 136 mEq/L (ref 135–145)

## 2012-01-26 LAB — PROTIME-INR
INR: 1.16 (ref 0.00–1.49)
Prothrombin Time: 14.6 seconds (ref 11.6–15.2)

## 2012-01-26 MED ORDER — INSULIN ASPART 100 UNIT/ML ~~LOC~~ SOLN
5.0000 [IU] | Freq: Once | SUBCUTANEOUS | Status: AC
  Administered 2012-01-26: 5 [IU] via SUBCUTANEOUS

## 2012-01-26 MED ORDER — WARFARIN SODIUM 5 MG PO TABS
5.0000 mg | ORAL_TABLET | Freq: Once | ORAL | Status: AC
  Administered 2012-01-26: 5 mg via ORAL
  Filled 2012-01-26: qty 1

## 2012-01-26 MED ORDER — MENTHOL 3 MG MT LOZG
1.0000 | LOZENGE | OROMUCOSAL | Status: DC | PRN
  Filled 2012-01-26: qty 9

## 2012-01-26 MED ORDER — INSULIN ASPART 100 UNIT/ML ~~LOC~~ SOLN
5.0000 [IU] | Freq: Three times a day (TID) | SUBCUTANEOUS | Status: DC
  Administered 2012-01-26 – 2012-01-27 (×2): 5 [IU] via SUBCUTANEOUS

## 2012-01-26 MED ORDER — PREDNISONE 20 MG PO TABS
40.0000 mg | ORAL_TABLET | Freq: Every day | ORAL | Status: DC
  Administered 2012-01-26 – 2012-01-27 (×2): 40 mg via ORAL
  Filled 2012-01-26 (×3): qty 2

## 2012-01-26 MED ORDER — INSULIN GLARGINE 100 UNIT/ML ~~LOC~~ SOLN
10.0000 [IU] | Freq: Every day | SUBCUTANEOUS | Status: DC
  Administered 2012-01-26: 10 [IU] via SUBCUTANEOUS

## 2012-01-26 NOTE — Progress Notes (Signed)
ANTICOAGULATION CONSULT NOTE - Follow up [place] for Lovenox, Warfarin Indication: pulmonary embolism   Allergies  Allergen Reactions  . Penicillins Other (See Comments)    Swells up  . Peach (Prunus Persica) Hives  . Shellfish Allergy Hives    Patient Measurements: 10/9 Weight 120.2kg  Vital Signs: Temp: 98.4 F (36.9 C) (10/10 0525) Temp src: Oral (10/10 0525) BP: 131/63 mmHg (10/10 0949) Pulse Rate: 76  (10/10 0949)  Labs:  Basename 01/26/12 0455 01/25/12 0655 01/25/12 0112 01/24/12 2059 01/24/12 1920 01/24/12 1350  HGB 11.1* 11.7* -- -- -- --  HCT 33.1* 34.7* -- -- -- 35.2*  PLT 232 240 -- -- -- 250  APTT -- -- -- -- -- --  LABPROT 14.6 13.6 -- 14.3 -- --  INR 1.16 1.05 -- 1.13 -- --  HEPARINUNFRC -- -- -- -- -- --  CREATININE 1.18* 0.95 -- -- -- 0.95  CKTOTAL -- -- -- -- -- --  CKMB -- -- -- -- -- --  TROPONINI -- <0.30 <0.30 -- <0.30 --    Estimated Creatinine Clearance: 58.7 ml/min (by C-G formula based on Cr of 1.18).   Medical History: Past Medical History  Diagnosis Date  . COPD (chronic obstructive pulmonary disease)   . Diabetes mellitus without complication   . CHF (congestive heart failure)   . Lupus   . Cancer     breast cancer    Medications:  Scheduled:     . albuterol  2.5 mg Nebulization Q6H  . aspirin  325 mg Oral q morning - 10a  . atenolol  100 mg Oral QHS  . budesonide-formoterol  2 puff Inhalation BID  . diltiazem  300 mg Oral q morning - 10a  . enalapril  20 mg Oral q morning - 10a  . enoxaparin (LOVENOX) injection  120 mg Subcutaneous Q12H  . feeding supplement  237 mL Oral BID BM  . furosemide  40 mg Oral Daily  . hydrALAZINE  25 mg Oral Q8H  . insulin aspart  0-20 Units Subcutaneous TID WC  . insulin aspart  0-5 Units Subcutaneous QHS  . insulin aspart  5 Units Subcutaneous TID WC  . insulin aspart  5 Units Subcutaneous Once  . insulin glargine  10 Units Subcutaneous QHS  . ipratropium  0.5 mg  Nebulization Q6H  . isosorbide mononitrate  30 mg Oral Daily  . levofloxacin (LEVAQUIN) IV  500 mg Intravenous Q24H  . pantoprazole  40 mg Oral Q1200  . predniSONE  40 mg Oral Q breakfast  . simvastatin  10 mg Oral q1800  . sodium chloride  3 mL Intravenous Q12H  . warfarin  5 mg Oral ONCE-1800  . warfarin   Does not apply Once  . Warfarin - Pharmacist Dosing Inpatient   Does not apply q1800  . DISCONTD: insulin aspart  3 Units Subcutaneous TID WC   Infusions:   PRN: acetaminophen, albuterol, hydrALAZINE, menthol-cetylpyridinium, ondansetron (ZOFRAN) IV, ondansetron, oxyCODONE  Assessment:  71 yo F presented to ER 10/8 with SOB, found to have scattered small pulmonary emboli throughout the right lung on CT angio chest.  Started on Lovenox and warfarin 10/8.  Doppler negative for DVT.  Patient had recent long-distance car travel but also has h/o breast CA and on tamoxifen PTA.  Tamoxifen currently on hold.  F/u plan for continued tamoxifen use.  Drug interaction noted: Levaquin and warfarin concurrent therapy can increase INR  INR subtherapeutic as expected after 2 doses.  CBC  stable  Goal of Therapy:  INR 2-3 Anti-Xa level 0.6-1.2 units/ml 4hrs after LMWH dose given Monitor platelets by anticoagulation protocol: Yes   Plan:   Continue Lovenox 120 mg SQ Q12h  Repeat warfarin 5mg  PO today at 1800  Daily PT/INR, CBC Q72h, follow renal function  Warfarin education by pharmacist to follow   Hershal Coria, PharmD, BCPS Pager: [phone] 01/26/2012 2:06 PM

## 2012-01-26 NOTE — Progress Notes (Signed)
Patient ID: [name] [name]  female  [id number]    DOB: 01-19-1941    DOA: 01/24/2012  PCP: Philis Fendt, MD  Subjective: Feels "rotten" with headache, No chest pain, nausea, vomiting and abdominal pain. SOB improving but with expiratory wheezing  Objective: Weight change: -0.079 kg (-2.8 oz)  Intake/Output Summary (Last 24 hours) at 01/26/12 1258 Last data filed at 01/25/12 1905  Gross per 24 hour  Intake    480 ml  Output      0 ml  Net    480 ml   Blood pressure 131/63, pulse 76, temperature 98.4 F (36.9 C), temperature source Oral, resp. rate 18, height 5\' 6"  (1.676 m), weight 120.521 kg (265 lb 11.2 oz), SpO2 96.00%.  Physical Exam: General: Alert and awake, oriented x3, not in any acute distress. HEENT: anicteric sclera, pupils reactive to light and accommodation, EOMI CVS: S1-S2 clear, no murmur rubs or gallops Chest: Bilateral scattered expiratory wheezing Abdomen: soft nontender, nondistended, normal bowel sounds, no organomegaly Extremities: no cyanosis, clubbing or edema noted bilaterally Neuro: Cranial nerves II-XII intact, no focal neurological deficits  Lab Results: Basic Metabolic Panel:  Lab [id number] 0455 01/25/12 1215 01/25/12 0655  NA 136 -- 134*  K 4.0 -- 4.8  CL 101 -- 99  CO2 26 -- 23  GLUCOSE 268* 414* --  BUN 26* -- 15  CREATININE 1.18* -- 0.95  CALCIUM 8.8 -- 8.9  MG -- -- --  PHOS -- -- --   Liver Function Tests:  Lab 01/25/12 0655 01/24/12 1350  AST 20 21  ALT 18 20  ALKPHOS 61 74  BILITOT 0.3 0.3  PROT 7.4 7.7  ALBUMIN 2.9* 3.3*   CBC:  Lab 01/26/12 0455 01/25/12 0655 01/24/12 1350  WBC 13.7* 9.6 --  NEUTROABS -- -- 8.8*  HGB 11.1* 11.7* --  HCT 33.1* 34.7* --  MCV 84.9 84.6 --  PLT 232 240 --   Cardiac Enzymes:  Lab 01/25/12 0655 01/25/12 0112 01/24/12 1920  CKTOTAL -- -- --  CKMB -- -- --  CKMBINDEX -- -- --  TROPONINI <0.30 <0.30 <0.30   CBG:  Lab 01/26/12 1224 01/26/12 0754 01/25/12 2153 01/25/12 1648 01/25/12  1400  GLUCAP 239* 250* 203* 379* 405*     Micro Results: Recent Results (from the past 240 hour(s))  MRSA PCR SCREENING     Status: Normal   Collection Time   01/24/12  8:17 PM      Component Value Range Status Comment   MRSA by PCR NEGATIVE  NEGATIVE Final   URINE CULTURE     Status: Normal   Collection Time   01/24/12  9:30 PM      Component Value Range Status Comment   Specimen Description URINE, CLEAN CATCH   Final    Special Requests NONE   Final    Culture  Setup Time 01/25/2012 01:54   Final    Colony Count NO GROWTH   Final    Culture NO GROWTH   Final    Report Status 01/25/2012 FINAL   Final     Studies/Results: Dg Chest 1 View  01/24/2012    IMPRESSION:  1.  Mildly indistinct pulmonary vasculature is nonspecific on this semi erect view, but could indicate pulmonary venous hypertension. No overt edema or overt cardiomegaly.  No airspace opacity noted.   Original Report Authenticated By: Carron Curie, M.D.    Ct Angio Chest Pe W/cm &/or Wo Cm  01/24/2012  IMPRESSION: Scattered small  pulmonary emboli throughout the right lung segmental branches and also suspected in the left lower lobe segmental vasculature.  Mild left upper lobe ground-glass opacity nonspecific but suspect mild alveolitis.  Bibasilar atelectasis.   Original Report Authenticated By: Jerilynn Mages. Daryll Brod, M.D.     Medications: Scheduled Meds:    . albuterol  2.5 mg Nebulization Q6H  . aspirin  325 mg Oral q morning - 10a  . atenolol  100 mg Oral QHS  . budesonide-formoterol  2 puff Inhalation BID  . diltiazem  300 mg Oral q morning - 10a  . enalapril  20 mg Oral q morning - 10a  . enoxaparin (LOVENOX) injection  120 mg Subcutaneous Q12H  . feeding supplement  237 mL Oral BID BM  . furosemide  40 mg Oral Daily  . hydrALAZINE  25 mg Oral Q8H  . insulin aspart  0-20 Units Subcutaneous TID WC  . insulin aspart  0-5 Units Subcutaneous QHS  . insulin aspart  5 Units Subcutaneous TID WC  . insulin  glargine  10 Units Subcutaneous QHS  . ipratropium  0.5 mg Nebulization Q6H  . isosorbide mononitrate  30 mg Oral Daily  . levofloxacin (LEVAQUIN) IV  500 mg Intravenous Q24H  . pantoprazole  40 mg Oral Q1200  . predniSONE  40 mg Oral Q breakfast  . simvastatin  10 mg Oral q1800  . sodium chloride  3 mL Intravenous Q12H  . warfarin  5 mg Oral ONCE-1800  . warfarin   Does not apply Once  . Warfarin - Pharmacist Dosing Inpatient   Does not apply q1800  . DISCONTD: insulin aspart  3 Units Subcutaneous TID WC  . DISCONTD: insulin aspart  3 Units Subcutaneous TID WC   Continuous Infusions:    Assessment/Plan: Principal Problem:  *Acute bilateral Pulmonary embolism: ? Unclear if it is due to long-distance car drive vs ca breast or tamoxifen - Doppler ultrasound done  shows no DVT - 2-D echocardiogram EF 55-60% with grade 1 diastolic dysfunction - Continue full dose Lovenox with Coumadin (day 2). Patient will need Lovenox teaching.   Active Problems:  Chronic diastolic heart failure: Currently compensated - Continue all cardiac medications, aspirin, Cardizem, BB, Vasotec and Lasix    COPD (chronic obstructive pulmonary disease) exacerbation; still with sig wheezing  - Add prednisone 40 mg daily - on scheduled albuterol and Atrovent nebs, Symbicort, Levaquin and wean off O2 as needed   Diabetes mellitus: Blood sugars uncontrolled secondary to steroids, hemoglobin A1c 9.7 - Increased meal coverage, resistant sliding scale, add Lantus   Hypertension: Continue oral antihypertensives   Leukocytosis: likely sec to steroids   Anemia: Stable  DVT Prophylaxis:On full dose Lovenox and Coumadin  Code Status: Presumed Full code  Disposition:Not medically ready, add PT eval, home O2 eval. Patient wants to be DC'ed home when ready.   LOS: 2 days   Shana Younge M.D. [facility] 01/26/2012, 12:58 PM Pager: [phone]  If 7PM-7AM, please contact  night-coverage [email or web address] Password [email or web address]

## 2012-01-27 DIAGNOSIS — I5032 Chronic diastolic (congestive) heart failure: Secondary | ICD-10-CM

## 2012-01-27 DIAGNOSIS — J189 Pneumonia, unspecified organism: Secondary | ICD-10-CM

## 2012-01-27 LAB — BASIC METABOLIC PANEL
CO2: 26 mEq/L (ref 19–32)
Calcium: 9 mg/dL (ref 8.4–10.5)
Creatinine, Ser: 1.14 mg/dL — ABNORMAL HIGH (ref 0.50–1.10)
GFR calc non Af Amer: 48 mL/min — ABNORMAL LOW (ref >90)
Glucose, Bld: 353 mg/dL — ABNORMAL HIGH (ref 70–99)
Sodium: 135 mEq/L (ref 135–145)

## 2012-01-27 LAB — GLUCOSE, CAPILLARY
Glucose-Capillary: 365 mg/dL — ABNORMAL HIGH (ref 70–99)
Glucose-Capillary: 230 mg/dL — ABNORMAL HIGH (ref 70–99)

## 2012-01-27 LAB — PROTIME-INR: Prothrombin Time: 18 seconds — ABNORMAL HIGH (ref 11.6–15.2)

## 2012-01-27 LAB — CBC
MCH: 28.4 pg (ref 26.0–34.0)
MCHC: 33.2 g/dL (ref 30.0–36.0)
MCV: 85.5 fL (ref 78.0–100.0)
Platelets: 273 10*3/uL (ref 150–400)

## 2012-01-27 MED ORDER — INSULIN GLARGINE 100 UNIT/ML ~~LOC~~ SOLN
40.0000 [IU] | Freq: Every day | SUBCUTANEOUS | Status: DC
  Administered 2012-01-27: 40 [IU] via SUBCUTANEOUS

## 2012-01-27 MED ORDER — INSULIN ASPART 100 UNIT/ML ~~LOC~~ SOLN
10.0000 [IU] | Freq: Three times a day (TID) | SUBCUTANEOUS | Status: DC
Start: 2012-01-27 — End: 2012-01-29
  Administered 2012-01-27: 10 [IU] via SUBCUTANEOUS
  Administered 2012-01-27: 100 [IU] via SUBCUTANEOUS
  Administered 2012-01-28 – 2012-01-29 (×4): 10 [IU] via SUBCUTANEOUS

## 2012-01-27 MED ORDER — INSULIN ASPART 100 UNIT/ML ~~LOC~~ SOLN: 6.0000 [IU] | Freq: Three times a day (TID) | SUBCUTANEOUS | Status: DC

## 2012-01-27 MED ORDER — IPRATROPIUM BROMIDE 0.02 % IN SOLN
0.5000 mg | Freq: Four times a day (QID) | RESPIRATORY_TRACT | Status: DC
  Administered 2012-01-28 – 2012-01-29 (×4): 0.5 mg via RESPIRATORY_TRACT
  Filled 2012-01-27 (×4): qty 2.5

## 2012-01-27 MED ORDER — INSULIN GLARGINE 100 UNIT/ML ~~LOC~~ SOLN: 15.0000 [IU] | Freq: Every day | SUBCUTANEOUS | Status: DC

## 2012-01-27 MED ORDER — ALBUTEROL SULFATE (5 MG/ML) 0.5% IN NEBU
2.5000 mg | INHALATION_SOLUTION | Freq: Four times a day (QID) | RESPIRATORY_TRACT | Status: DC
  Administered 2012-01-28 – 2012-01-29 (×4): 2.5 mg via RESPIRATORY_TRACT
  Filled 2012-01-27 (×4): qty 0.5

## 2012-01-27 MED ORDER — BUDESONIDE 0.25 MG/2ML IN SUSP
0.2500 mg | Freq: Two times a day (BID) | RESPIRATORY_TRACT | Status: DC
  Administered 2012-01-27 – 2012-01-29 (×4): 0.25 mg via RESPIRATORY_TRACT
  Filled 2012-01-27 (×7): qty 2

## 2012-01-27 MED ORDER — ALBUTEROL SULFATE (5 MG/ML) 0.5% IN NEBU: 2.5000 mg | INHALATION_SOLUTION | RESPIRATORY_TRACT | Status: DC | PRN

## 2012-01-27 MED ORDER — HYDROCODONE-HOMATROPINE 5-1.5 MG/5ML PO SYRP
5.0000 mL | ORAL_SOLUTION | Freq: Two times a day (BID) | ORAL | Status: DC
  Administered 2012-01-27 – 2012-01-28 (×3): 5 mL via ORAL
  Filled 2012-01-27 (×3): qty 5

## 2012-01-27 MED ORDER — WARFARIN SODIUM 5 MG PO TABS
5.0000 mg | ORAL_TABLET | Freq: Once | ORAL | Status: AC
  Administered 2012-01-27: 5 mg via ORAL
  Filled 2012-01-27: qty 1

## 2012-01-27 MED ORDER — PREDNISONE 20 MG PO TABS
40.0000 mg | ORAL_TABLET | Freq: Every day | ORAL | Status: DC
Start: 2012-01-28 — End: 2012-01-28
  Administered 2012-01-28: 40 mg via ORAL
  Filled 2012-01-27 (×2): qty 2

## 2012-01-27 NOTE — Evaluation (Signed)
Physical Therapy Evaluation Patient Details Name: [name] [name] MRN: [id number] DOB: 20-Aug-1940 Today's Date: 01/27/2012 Time: [id number] PT Time Calculation (min): 17 min  PT Assessment / Plan / Recommendation Clinical Impression  71 yo female admitted with PE, hyperglycemia. Pt lives alone but has family that checks on her from time to time. On eval, pt requires Min-guard assist for mobility. Noted dyspnea with exertion, despite use of O2.  Recommend HHPT, intermittent supervision, and home health aide (if possible).     PT Assessment  Patient needs continued PT services    Follow Up Recommendations  Home health PT;Supervision - Intermittent; Home health aide    Does the patient have the potential to tolerate intense rehabilitation      Barriers to Discharge Decreased caregiver support      Equipment Recommendations  None recommended by PT    Recommendations for Other Services OT consult   Frequency Min 3X/week    Precautions / Restrictions Precautions Precautions: Fall Restrictions Weight Bearing Restrictions: No   Pertinent Vitals/Pain Pt denies      Mobility  Bed Mobility Bed Mobility: Supine to Sit Supine to Sit: 4: Min guard Details for Bed Mobility Assistance: Increased time and effort.  Transfers Transfers: Sit to Stand;Stand to Sit Sit to Stand: 4: Min guard;From bed;From toilet Stand to Sit: 4: Min guard;To chair/3-in-1;To toilet Details for Transfer Assistance: Pt demo'd good technique with safety and hand placement. Ambulation/Gait Ambulation/Gait Assistance: 4: Min guard Ambulation Distance (Feet): 75 Feet Assistive device: Straight cane Ambulation/Gait Assistance Details: Unsteady intermittently but no LOB. Slow gait speed. VCs pursed lip breathing. Dsypnea 3/4. O2 sats 96% on 2L O2.  Gait Pattern: Step-through pattern;Decreased stride length;Decreased step length - right;Decreased step length - left    Shoulder Instructions     Exercises     PT  Diagnosis: Difficulty walking  PT Problem List: Decreased activity tolerance;Decreased mobility;Decreased balance PT Treatment Interventions: Gait training;Functional mobility training;Therapeutic activities;Therapeutic exercise;Balance training;Patient/family education   PT Goals Acute Rehab PT Goals PT Goal Formulation: With patient Time For Goal Achievement: 02/10/12 Potential to Achieve Goals: Good Pt will go Supine/Side to Sit: with modified independence PT Goal: Supine/Side to Sit - Progress: Goal set today Pt will go Sit to Supine/Side: with modified independence PT Goal: Sit to Supine/Side - Progress: Goal set today Pt will go Sit to Stand: with modified independence PT Goal: Sit to Stand - Progress: Goal set today Pt will Ambulate: 51 - 150 feet;with modified independence;with least restrictive assistive device PT Goal: Ambulate - Progress: Goal set today Pt will Go Up / Down Stairs: 3-5 stairs;with modified independence;with rail(s);with least restrictive assistive device PT Goal: Up/Down Stairs - Progress: Goal set today  Visit Information  Last PT Received On: 01/27/12 Assistance Needed: +1 PT/OT Co-Evaluation/Treatment: Yes    Subjective Data  Subjective: "I have good days and bad days" Patient Stated Goal: Home   Prior Halfway Lives With: Alone Available Help at Discharge: Family;Friend(s);Available PRN/intermittently Type of Home: Apartment Home Access: Stairs to enter Entrance Stairs-Number of Steps: 4 Entrance Stairs-Rails: Right;Left;Can reach both Home Layout: One level Bathroom Shower/Tub: Chiropodist: Standard Home Adaptive Equipment: Grab bars in shower;Shower chair with back;Grab bars around toilet;Straight cane Prior Function Level of Independence: Independent with assistive device(s) Able to Take Stairs?: Yes Driving: No 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 difficulties Dominant Hand: Right      Cognition  Overall Cognitive Status: Appears within functional limits for tasks assessed/performed Arousal/Alertness: Awake/alert  Orientation Level: Appears intact for tasks assessed Behavior During Session: [facility] for tasks performed    Extremity/Trunk Assessment Right Upper Extremity Assessment RUE ROM/Strength/Tone: [facility] for tasks assessed Left Upper Extremity Assessment LUE ROM/Strength/Tone: [facility] for tasks assessed Right Lower Extremity Assessment RLE ROM/Strength/Tone: [facility] for tasks assessed Left Lower Extremity Assessment LLE ROM/Strength/Tone: WFL for tasks assessed Trunk Assessment Trunk Assessment: Normal   Balance Static Sitting Balance Static Sitting - Balance Support: No upper extremity supported;Feet supported Static Sitting - Level of Assistance: 6: Modified independent (Device/Increase time) Static Standing Balance Static Standing - Balance Support: During functional activity Static Standing - Level of Assistance: 5: Stand by assistance  End of Session PT - End of Session Equipment Utilized During Treatment: Gait belt;Oxygen Activity Tolerance:  (Limited by dyspnea) Patient left: in chair;with call bell/phone within reach  GP     Weston Anna [facility] 01/27/2012, 9:27 AM [phone]

## 2012-01-27 NOTE — Patient Instructions (Signed)
Respiratory Care Note  Pt started on a flutter.  Was able to do 8-10 attempts on the device. Will still some coaching and instructing  Andre Lefort, RRT,RPFT, RCP

## 2012-01-27 NOTE — Progress Notes (Addendum)
Patient ID: [name] [name]  female  [id number]    DOB: [birth year]    DOA: 01/24/2012  PCP: Philis Fendt, MD  Subjective:  Feeling slightly better today, still some expiratory wheezing. No chest pain, nausea vomiting  Objective: Weight change: 0.079 kg (2.8 oz)  Intake/Output Summary (Last 24 hours) at 01/27/12 1721 Last data filed at 01/27/12 1200  Gross per 24 hour  Intake    900 ml  Output      0 ml  Net    900 ml   Blood pressure 125/54, pulse 66, temperature 97.9 F (36.6 C), temperature source Oral, resp. rate 20, height 5\' 6"  (1.676 m), weight 120.6 kg (265 lb 14 oz), SpO2 95.00%.  Physical Exam: General: Alert and awake, oriented x3, not in any acute distress. HEENT: anicteric sclera, pupils reactive to light and accommodation, EOMI CVS: S1-S2 clear, no murmur rubs or gallops Chest: Bilateral scattered expiratory wheezing, however improving from yesterday. Abdomen: soft nontender, nondistended, normal bowel sounds, no organomegaly Extremities: no cyanosis, clubbing or edema noted bilaterally Neuro: Cranial nerves II-XII intact, no focal neurological deficits  Lab Results: Basic Metabolic Panel:  Lab [id number] 01/26/12 0455  NA 135 136  K 4.9 4.0  CL 100 101  CO2 26 26  GLUCOSE 353* 268*  BUN 25* 26*  CREATININE 1.14* 1.18*  CALCIUM 9.0 8.8  MG -- --  PHOS -- --   Liver Function Tests:  Lab 01/25/12 0655 01/24/12 1350  AST 20 21  ALT 18 20  ALKPHOS 61 74  BILITOT 0.3 0.3  PROT 7.4 7.7  ALBUMIN 2.9* 3.3*   CBC:  Lab 01/27/12 0454 01/26/12 0455 01/24/12 1350  WBC 8.9 13.7* --  NEUTROABS -- -- 8.8*  HGB 11.2* 11.1* --  HCT 33.7* 33.1* --  MCV 85.5 84.9 --  PLT 273 232 --   Cardiac Enzymes:  Lab 01/25/12 0655 01/25/12 0112 01/24/12 1920  CKTOTAL -- -- --  CKMB -- -- --  CKMBINDEX -- -- --  TROPONINI <0.30 <0.30 <0.30   CBG:  Lab 01/27/12 1224 01/27/12 0809 01/26/12 2032 01/26/12 1728 01/26/12 1224  GLUCAP 308* 365* 360* 267* 239*      Micro Results: Recent Results (from the past 240 hour(s))  MRSA PCR SCREENING     Status: Normal   Collection Time   01/24/12  8:17 PM      Component Value Range Status Comment   MRSA by PCR NEGATIVE  NEGATIVE Final   URINE CULTURE     Status: Normal   Collection Time   01/24/12  9:30 PM      Component Value Range Status Comment   Specimen Description URINE, CLEAN CATCH   Final    Special Requests NONE   Final    Culture  Setup Time 01/25/2012 01:54   Final    Colony Count NO GROWTH   Final    Culture NO GROWTH   Final    Report Status 01/25/2012 FINAL   Final     Studies/Results: Dg Chest 1 View  01/24/2012    IMPRESSION:  1.  Mildly indistinct pulmonary vasculature is nonspecific on this semi erect view, but could indicate pulmonary venous hypertension. No overt edema or overt cardiomegaly.  No airspace opacity noted.   Original Report Authenticated By: Carron Curie, M.D.    Ct Angio Chest Pe W/cm &/or Wo Cm  01/24/2012  IMPRESSION: Scattered small pulmonary emboli throughout the right lung segmental branches and also suspected in  the left lower lobe segmental vasculature.  Mild left upper lobe ground-glass opacity nonspecific but suspect mild alveolitis.  Bibasilar atelectasis.   Original Report Authenticated By: Jerilynn Mages. Daryll Brod, M.D.     Medications: Scheduled Meds:    . albuterol  2.5 mg Nebulization Q6H  . aspirin  325 mg Oral q morning - 10a  . atenolol  100 mg Oral QHS  . budesonide  0.25 mg Nebulization BID  . diltiazem  300 mg Oral q morning - 10a  . enalapril  20 mg Oral q morning - 10a  . enoxaparin (LOVENOX) injection  120 mg Subcutaneous Q12H  . feeding supplement  237 mL Oral BID BM  . furosemide  40 mg Oral Daily  . hydrALAZINE  25 mg Oral Q8H  . HYDROcodone-homatropine  5 mL Oral Q12H  . insulin aspart  0-20 Units Subcutaneous TID WC  . insulin aspart  0-5 Units Subcutaneous QHS  . insulin aspart  10 Units Subcutaneous TID AC  . insulin  glargine  40 Units Subcutaneous QHS  . ipratropium  0.5 mg Nebulization Q6H  . isosorbide mononitrate  30 mg Oral Daily  . levofloxacin (LEVAQUIN) IV  500 mg Intravenous Q24H  . pantoprazole  40 mg Oral Q1200  . predniSONE  40 mg Oral Q breakfast  . simvastatin  10 mg Oral q1800  . sodium chloride  3 mL Intravenous Q12H  . warfarin  5 mg Oral ONCE-1800  . warfarin  5 mg Oral ONCE-1800  . Warfarin - Pharmacist Dosing Inpatient   Does not apply q1800  . DISCONTD: budesonide-formoterol  2 puff Inhalation BID  . DISCONTD: insulin aspart  5 Units Subcutaneous TID WC  . DISCONTD: insulin aspart  6 Units Subcutaneous TID WC  . DISCONTD: insulin glargine  10 Units Subcutaneous QHS  . DISCONTD: insulin glargine  15 Units Subcutaneous QHS  . DISCONTD: predniSONE  40 mg Oral Q breakfast   Continuous Infusions:    Assessment/Plan: Principal Problem:  *Acute bilateral Pulmonary embolism: ? Unclear if it is due to long-distance car drive vs ca breast or tamoxifen - Doppler ultrasound done  shows no DVT - 2-D echocardiogram EF 55-60% with grade 1 diastolic dysfunction - Continue full dose Lovenox with Coumadin. Patient will need Lovenox teaching. INR improved to 1.5  Active Problems:  Chronic diastolic heart failure: Currently compensated - Continue all cardiac medications, aspirin, Cardizem, BB, Vasotec and Lasix    COPD (chronic obstructive pulmonary disease) exacerbation; still with sig wheezing  - Continue prednisone 40 mg daily, on scheduled albuterol and Atrovent nebs, Levaquin and wean off O2 as needed. Placed on Pulmicort.   Diabetes mellitus: Blood sugars uncontrolled secondary to steroids, hemoglobin A1c 9.7 - On explaining the patient importance of blood sugar control and needing insulin, she explained that she is actually on Lantus at home 40 units qhs, meal coverage 10 units TID. I have updated her outpatient regimen and adjusted the insulin. Hopefully blood sugars will improve  tomorrow with increased long-acting insulin.   Hypertension: Continue oral antihypertensives   Leukocytosis: likely sec to steroids   Anemia: Stable  DVT Prophylaxis:On full dose Lovenox and Coumadin  Code Status: Presumed Full code  Disposition: Not medically ready, hopefully tomorrow   LOS: 3 days   Jordyan Hardiman M.D. [facility] 01/27/2012, 5:21 PM Pager: [phone]  If 7PM-7AM, please contact night-coverage [email or web address] Password [email or web address]  Addendum: Discussed with Dr Truddie Coco on call regarding anticoagulation. Rec'ed current management with Lovenox and Coumadin. Patient needs  to be completely off tamoxifen due to hypercoagulability side effects. Per Dr. Truddie Coco, she can be started on Arimidex (no hypercoagulability side effects) by her oncologist outpatient, which does not need to be started in-patient.   Enos Muhl M.D. [facility] 01/27/2012, 6:06 PM  Pager: [phone]

## 2012-01-27 NOTE — Progress Notes (Addendum)
ANTICOAGULATION CONSULT NOTE - Follow up [place] for Lovenox, Warfarin Indication: pulmonary embolism   Allergies  Allergen Reactions  . Penicillins Other (See Comments)    Swells up  . Peach (Prunus Persica) Hives  . Shellfish Allergy Hives    Patient Measurements: 10/9 Weight 120.2kg  Vital Signs: Temp: 97.5 F (36.4 C) (10/11 0545) Temp src: Oral (10/11 0545) BP: 147/68 mmHg (10/11 0545) Pulse Rate: 67  (10/11 0545)  Labs:  Basename 01/27/12 0454 01/26/12 0455 01/25/12 0655 01/25/12 0112 01/24/12 1920  HGB 11.2* 11.1* -- -- --  HCT 33.7* 33.1* 34.7* -- --  PLT 273 232 240 -- --  APTT -- -- -- -- --  LABPROT 18.0* 14.6 13.6 -- --  INR 1.54* 1.16 1.05 -- --  HEPARINUNFRC -- -- -- -- --  CREATININE 1.14* 1.18* 0.95 -- --  CKTOTAL -- -- -- -- --  CKMB -- -- -- -- --  TROPONINI -- -- <0.30 <0.30 <0.30    Estimated Creatinine Clearance: 60.7 ml/min (by C-G formula based on Cr of 1.14).   Medical History: Past Medical History  Diagnosis Date  . COPD (chronic obstructive pulmonary disease)   . Diabetes mellitus without complication   . CHF (congestive heart failure)   . Lupus   . Cancer     breast cancer    Medications:  Scheduled:     . albuterol  2.5 mg Nebulization Q6H  . aspirin  325 mg Oral q morning - 10a  . atenolol  100 mg Oral QHS  . budesonide  0.25 mg Nebulization BID  . diltiazem  300 mg Oral q morning - 10a  . enalapril  20 mg Oral q morning - 10a  . enoxaparin (LOVENOX) injection  120 mg Subcutaneous Q12H  . feeding supplement  237 mL Oral BID BM  . furosemide  40 mg Oral Daily  . hydrALAZINE  25 mg Oral Q8H  . HYDROcodone-homatropine  5 mL Oral Q12H  . insulin aspart  0-20 Units Subcutaneous TID WC  . insulin aspart  0-5 Units Subcutaneous QHS  . insulin aspart  10 Units Subcutaneous TID AC  . insulin glargine  40 Units Subcutaneous QHS  . ipratropium  0.5 mg Nebulization Q6H  . isosorbide mononitrate  30 mg Oral  Daily  . levofloxacin (LEVAQUIN) IV  500 mg Intravenous Q24H  . pantoprazole  40 mg Oral Q1200  . predniSONE  40 mg Oral Q breakfast  . simvastatin  10 mg Oral q1800  . sodium chloride  3 mL Intravenous Q12H  . warfarin  5 mg Oral ONCE-1800  . Warfarin - Pharmacist Dosing Inpatient   Does not apply q1800  . DISCONTD: budesonide-formoterol  2 puff Inhalation BID  . DISCONTD: insulin aspart  5 Units Subcutaneous TID WC  . DISCONTD: insulin aspart  6 Units Subcutaneous TID WC  . DISCONTD: insulin glargine  10 Units Subcutaneous QHS  . DISCONTD: insulin glargine  15 Units Subcutaneous QHS  . DISCONTD: predniSONE  40 mg Oral Q breakfast   Infusions:   PRN: acetaminophen, albuterol, hydrALAZINE, menthol-cetylpyridinium, ondansetron (ZOFRAN) IV, ondansetron, oxyCODONE, DISCONTD: albuterol  Assessment:  71 yo F presented to ER 10/8 with SOB, found to have scattered small pulmonary emboli throughout the right lung on CT angio chest.  Started on Lovenox and warfarin 10/8.  Doppler negative for DVT.  Patient had recent long-distance car travel but also has h/o breast CA and on tamoxifen PTA.  Tamoxifen currently on hold.  F/u plan for continued tamoxifen use.  Drug interaction noted: Levaquin and warfarin concurrent therapy can increase INR  INR subtherapeutic but responding to 5 mg doses.  Day #4 warfarin/Lovenox overlap.  CBC stable  Goal of Therapy:  INR 2-3 Anti-Xa level 0.6-1.2 units/ml 4hrs after LMWH dose given Monitor platelets by anticoagulation protocol: Yes   Plan:   Continue Lovenox 120 mg SQ Q12h  Repeat warfarin 5mg  PO today at 1800  Daily PT/INR, CBC Q72h, follow renal function  Warfarin education by pharmacist to follow   Hershal Coria, PharmD, BCPS Pager: [phone] 01/27/2012 1:24 PM     ADDENDUM: 01/27/2012 1:50 PM Completed warfarin education with patient.  Patient demonstrated a good understanding of the safety and use of warfarin therapy (including  drug/food interactions, s/sx's bleeding, INR monitoring).  When talking about her home medications, the patient was aware that her tamoxifen was not continued her inpatient.  Told her that currently evaluating risk vs benefit of continuing tamoxifen following treatment of current VTEs.  Hershal Coria, PharmD, BCPS Pager: [phone] 01/27/2012 1:52 PM

## 2012-01-27 NOTE — Evaluation (Signed)
Occupational Therapy Evaluation Patient Details Name: [name] [name] MRN: [id number] DOB: November 27, 1940 Today's Date: 01/27/2012 Time: [id number] OT Time Calculation (min): 17 min  OT Assessment / Plan / Recommendation Clinical Impression  Pt is a 71 yo O2 dependent female living independently who presents with acute B PE, COPD exacerbation and Chronic diastolic heart failure. Skilled OT recommended to maximize independence with BADLs to mod I level in prep for safe d/c home with HHOT and intermittent supervision.    OT Assessment  Patient needs continued OT Services    Follow Up Recommendations  Home health OT;Supervision - Intermittent    Barriers to Discharge Decreased caregiver support    Equipment Recommendations  None recommended by PT    Recommendations for Other Services    Frequency  Min 2X/week    Precautions / Restrictions Precautions Precautions: Fall Restrictions Weight Bearing Restrictions: No   Pertinent Vitals/Pain O2 sats and HR stable on 2 L of O2.    ADL  Grooming: Performed;Wash/dry hands;Min guard Where Assessed - Grooming: Unsupported standing Upper Body Bathing: Simulated;Set up Where Assessed - Upper Body Bathing: Unsupported sitting Lower Body Bathing: Simulated;Min guard Where Assessed - Lower Body Bathing: Supported sit to stand Upper Body Dressing: Simulated;Set up Where Assessed - Upper Body Dressing: Unsupported sitting Lower Body Dressing: Performed;Set up Where Assessed - Lower Body Dressing: Unsupported sitting (donned socks.) Toilet Transfer: Performed;Min guard Toilet Transfer Method: Sit to Loss adjuster, chartered: Comfort height toilet;Grab bars Toileting - Clothing Manipulation and Hygiene: Performed;Min guard Where Assessed - Best boy and Hygiene: Sit to stand from 3-in-1 or toilet Equipment Used: Rolling walker Transfers/Ambulation Related to ADLs: Pt ambulated to the bathroom with minguard A without a  device. 1 LOB when turning to reach for paper towels which pt was able to self correct. ADL Comments: Pt fatigues quickly on 2 L of O2.    OT Diagnosis: Generalized weakness  OT Problem List: Decreased activity tolerance;Decreased knowledge of use of DME or AE OT Treatment Interventions: Self-care/ADL training;Therapeutic activities;Energy conservation;Patient/family education   OT Goals Acute Rehab OT Goals OT Goal Formulation: With patient Time For Goal Achievement: 02/10/12 Potential to Achieve Goals: Good ADL Goals Pt Will Perform Grooming: with modified independence;Standing at sink ADL Goal: Grooming - Progress: Goal set today Pt Will Transfer to Toilet: with modified independence;Ambulation;Regular height toilet;Grab bars;Comfort height toilet ADL Goal: Toilet Transfer - Progress: Goal set today Pt Will Perform Toileting - Clothing Manipulation: with modified independence;Sitting on 3-in-1 or toilet;Standing ADL Goal: Toileting - Clothing Manipulation - Progress: Goal set today Pt Will Perform Toileting - Hygiene: with modified independence;Sit to stand from 3-in-1/toilet ADL Goal: Toileting - Hygiene - Progress: Goal set today Pt Will Perform Tub/Shower Transfer: Tub transfer;with modified independence;Ambulation;Grab bars ADL Goal: Tub/Shower Transfer - Progress: Goal set today Additional ADL Goal #1: Pt will complete all aspects of bathing and dressing including ADL item retrieval at mod I level. ADL Goal: Additional Goal #1 - Progress: Goal set today Miscellaneous OT Goals Miscellaneous OT Goal #1: Pt will verbalize and incorporate 3 energy conservation strategies into ADLs and IADLs with min VCs. OT Goal: Miscellaneous Goal #1 - Progress: Goal set today  Visit Information  Last OT Received On: 01/27/12 Assistance Needed: +1 PT/OT Co-Evaluation/Treatment: Yes    Subjective Data  Subjective: I do all my own housework. I have a light vacuum cleaner. Patient Stated Goal:  Return home.   Prior Functioning     Home Living Lives With: Alone Available Help  at Discharge: Family;Friend(s);Available PRN/intermittently Type of Home: Apartment Home Access: Stairs to enter Entrance Stairs-Number of Steps: 4 Entrance Stairs-Rails: Right;Left;Can reach both Home Layout: One level Bathroom Shower/Tub: Chiropodist: Standard Home Adaptive Equipment: Grab bars in shower;Shower chair with back;Grab bars around toilet;Straight cane Prior Function Level of Independence: Independent with assistive device(s) Able to Take Stairs?: Yes Driving: No 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 difficulties Dominant Hand: Right         Vision/Perception     Cognition  Overall Cognitive Status: Appears within functional limits for tasks assessed/performed Arousal/Alertness: Awake/alert Orientation Level: Appears intact for tasks assessed Behavior During Session: [facility] for tasks performed    Extremity/Trunk Assessment Right Upper Extremity Assessment RUE ROM/Strength/Tone: [facility] for tasks assessed Left Upper Extremity Assessment LUE ROM/Strength/Tone: WFL for tasks assessed Right Lower Extremity Assessment RLE ROM/Strength/Tone: [facility] for tasks assessed Left Lower Extremity Assessment LLE ROM/Strength/Tone: [facility] for tasks assessed Trunk Assessment Trunk Assessment: Normal     Mobility Bed Mobility Bed Mobility: Supine to Sit Supine to Sit: 4: Min guard Details for Bed Mobility Assistance: Increased time and effort.  Transfers Transfers: Sit to Stand;Stand to Sit Sit to Stand: 4: Min guard;From bed;From toilet Stand to Sit: 4: Min guard;To chair/3-in-1;To toilet Details for Transfer Assistance: Pt demo'd good technique with safety and hand placement.     Shoulder Instructions     Exercise     Balance Static Sitting Balance Static Sitting - Balance Support: No upper extremity supported;Feet supported Static Sitting - Level of  Assistance: 6: Modified independent (Device/Increase time) Static Standing Balance Static Standing - Balance Support: During functional activity Static Standing - Level of Assistance: 5: Stand by assistance   End of Session OT - End of Session Activity Tolerance: Patient limited by fatigue Patient left: in chair;with call bell/phone within reach  [place] A OTR/L [phone] 01/27/2012, 9:27 AM

## 2012-01-28 LAB — CBC
HCT: 34.1 % — ABNORMAL LOW (ref 36.0–46.0)
Hemoglobin: 11.4 g/dL — ABNORMAL LOW (ref 12.0–15.0)
MCV: 85 fL (ref 78.0–100.0)
WBC: 12.5 10*3/uL — ABNORMAL HIGH (ref 4.0–10.5)

## 2012-01-28 LAB — BASIC METABOLIC PANEL
BUN: 23 mg/dL (ref 6–23)
CO2: 27 mEq/L (ref 19–32)
Chloride: 99 mEq/L (ref 96–112)
Creatinine, Ser: 1.08 mg/dL (ref 0.50–1.10)
Glucose, Bld: 239 mg/dL — ABNORMAL HIGH (ref 70–99)
Potassium: 4 mEq/L (ref 3.5–5.1)

## 2012-01-28 LAB — GLUCOSE, CAPILLARY
Glucose-Capillary: 208 mg/dL — ABNORMAL HIGH (ref 70–99)
Glucose-Capillary: 242 mg/dL — ABNORMAL HIGH (ref 70–99)

## 2012-01-28 LAB — PROTIME-INR: INR: 2.01 — ABNORMAL HIGH (ref 0.00–1.49)

## 2012-01-28 MED ORDER — HYDROCODONE-HOMATROPINE 5-1.5 MG/5ML PO SYRP
5.0000 mL | ORAL_SOLUTION | Freq: Three times a day (TID) | ORAL | Status: DC
  Administered 2012-01-28 – 2012-01-29 (×3): 5 mL via ORAL
  Filled 2012-01-28 (×3): qty 5

## 2012-01-28 MED ORDER — WARFARIN SODIUM 5 MG PO TABS
5.0000 mg | ORAL_TABLET | Freq: Once | ORAL | Status: AC
  Administered 2012-01-28: 5 mg via ORAL
  Filled 2012-01-28: qty 1

## 2012-01-28 MED ORDER — INSULIN GLARGINE 100 UNIT/ML ~~LOC~~ SOLN
45.0000 [IU] | Freq: Every day | SUBCUTANEOUS | Status: DC
  Administered 2012-01-28: 45 [IU] via SUBCUTANEOUS

## 2012-01-28 MED ORDER — PREDNISONE 20 MG PO TABS
30.0000 mg | ORAL_TABLET | Freq: Every day | ORAL | Status: DC
  Administered 2012-01-29: 30 mg via ORAL
  Filled 2012-01-28 (×2): qty 1

## 2012-01-28 MED ORDER — POLYETHYLENE GLYCOL 3350 17 G PO PACK
17.0000 g | PACK | Freq: Every day | ORAL | Status: DC
  Administered 2012-01-28 – 2012-01-29 (×2): 17 g via ORAL
  Filled 2012-01-28 (×2): qty 1

## 2012-01-28 NOTE — Progress Notes (Addendum)
ANTICOAGULATION CONSULT NOTE - Follow up [facility] for Lovenox, Warfarin Indication: pulmonary embolism   Allergies  Allergen Reactions  . Penicillins Other (See Comments)    Swells up  . Peach (Prunus Persica) Hives  . Shellfish Allergy Hives    Patient Measurements: 10/9 Weight 120.2kg  Vital Signs: Temp: 97.7 F (36.5 C) (10/12 0607) Temp src: Oral (10/12 0607) BP: 161/73 mmHg (10/12 0607) Pulse Rate: 70  (10/12 0607)  Labs:  Basename 01/28/12 0619 01/27/12 0454 01/26/12 0455  HGB 11.4* 11.2* --  HCT 34.1* 33.7* 33.1*  PLT 272 273 232  APTT -- -- --  LABPROT 22.0* 18.0* 14.6  INR 2.01* 1.54* 1.16  HEPARINUNFRC -- -- --  CREATININE 1.08 1.14* 1.18*  CKTOTAL -- -- --  CKMB -- -- --  TROPONINI -- -- --    Estimated Creatinine Clearance: 64.4 ml/min (by C-G formula based on Cr of 1.08).   Medical History: Past Medical History  Diagnosis Date  . COPD (chronic obstructive pulmonary disease)   . Diabetes mellitus without complication   . CHF (congestive heart failure)   . Lupus   . Cancer     breast cancer    Medications:  Scheduled:     . albuterol  2.5 mg Nebulization Q6H WA  . aspirin  325 mg Oral q morning - 10a  . atenolol  100 mg Oral QHS  . budesonide  0.25 mg Nebulization BID  . diltiazem  300 mg Oral q morning - 10a  . enalapril  20 mg Oral q morning - 10a  . enoxaparin (LOVENOX) injection  120 mg Subcutaneous Q12H  . feeding supplement  237 mL Oral BID BM  . furosemide  40 mg Oral Daily  . hydrALAZINE  25 mg Oral Q8H  . HYDROcodone-homatropine  5 mL Oral Q12H  . insulin aspart  0-20 Units Subcutaneous TID WC  . insulin aspart  0-5 Units Subcutaneous QHS  . insulin aspart  10 Units Subcutaneous TID AC  . insulin glargine  40 Units Subcutaneous QHS  . ipratropium  0.5 mg Nebulization Q6H WA  . isosorbide mononitrate  30 mg Oral Daily  . levofloxacin (LEVAQUIN) IV  500 mg Intravenous Q24H  . pantoprazole  40 mg Oral Q1200    . predniSONE  40 mg Oral Q breakfast  . simvastatin  10 mg Oral q1800  . sodium chloride  3 mL Intravenous Q12H  . warfarin  5 mg Oral ONCE-1800  . Warfarin - Pharmacist Dosing Inpatient   Does not apply q1800  . DISCONTD: albuterol  2.5 mg Nebulization Q6H  . DISCONTD: ipratropium  0.5 mg Nebulization Q6H   Infusions:   PRN: acetaminophen, albuterol, hydrALAZINE, menthol-cetylpyridinium, ondansetron (ZOFRAN) IV, ondansetron, oxyCODONE  Assessment:  71 yo F presented to ER 10/8 with SOB, found to have scattered small pulmonary emboli throughout the right lung on CT angio chest.  Started on Lovenox and warfarin 10/8.  Doppler negative for DVT.  Patient had recent long-distance car travel but also has h/o breast CA and on tamoxifen PTA.  Tamoxifen currently on hold.  F/u plan for continued tamoxifen use.  Drug interaction noted: Levaquin and warfarin concurrent therapy can increase INR  INR therapeutic today.  Day #5 warfarin/Lovenox overlap.  CBC stable  Warfarin education completed   Goal of Therapy:  INR 2-3 Anti-Xa level 0.6-1.2 units/ml 4hrs after LMWH dose given Monitor platelets by anticoagulation protocol: Yes   Plan:   Continue Lovenox 120 mg SQ  Q12h - Patient needs to complete day 5 of lovenox through today's doses for 5 days of minimum overlap.  If INR remains therapeutic tomorrow morning can d/c Lovenox if MD agrees.   Repeat warfarin 5mg  PO today at 1800  Daily PT/INR, CBC Q72h, follow renal function   Delrico Minehart, Gaye Alken PharmD Pager #: [phone] 10:22 AM 01/28/2012

## 2012-01-28 NOTE — Progress Notes (Signed)
Occupational Therapy Treatment Patient Details Name: TYECHIA DELCASTILLO MRN: [id number] DOB: June 17, 1940 Today's Date: 01/28/2012 Time: [id number] OT Time Calculation (min): 36 min  OT Assessment / Plan / Recommendation Comments on Treatment Session      Follow Up Recommendations  Home health OT;Supervision - Intermittent    Barriers to Discharge       Equipment Recommendations  None recommended by OT;None recommended by PT    Recommendations for Other Services    Frequency     Plan Discharge plan remains appropriate    Precautions / Restrictions Precautions Precautions: Fall Restrictions Weight Bearing Restrictions: No   Pertinent Vitals/Pain 5/10 L knee    ADL  Toilet Transfer: Performed;Min guard Toilet Transfer Method: Sit to Loss adjuster, chartered: Raised toilet seat with arms (or 3-in-1 over toilet) Equipment Used: Other (comment) (no AD:  Pt says she didn't need any) Transfers/Ambulation Related to ADLs: Min guard ambulating to bathroom; one LOB ADL Comments: Once she gathered supplies then able to complete bathing and dressing (performed) with set up level.  Pt incorporates rest breaks without cues. Educated on energy conservation:  pt often sits on shower seat at sink at home.      OT Diagnosis:    OT Problem List:   OT Treatment Interventions:     OT Goals ADL Goals Pt Will Transfer to Toilet: with modified independence;Ambulation;Regular height toilet;Grab bars;Comfort height toilet ADL Goal: Toilet Transfer - Progress: Progressing toward goals Additional ADL Goal #1: Pt will complete all aspects of bathing and dressing including ADL item retrieval at mod I level. ADL Goal: Additional Goal #1 - Progress: Progressing toward goals Miscellaneous OT Goals Miscellaneous OT Goal #1: Pt will verbalize and incorporate 3 energy conservation strategies into ADLs and IADLs with min VCs. OT Goal: Miscellaneous Goal #1 - Progress: Progressing toward goals  Visit  Information  Last OT Received On: 01/28/12 Assistance Needed: +1    Subjective Data      Prior Functioning       Cognition  Overall Cognitive Status: Appears within functional limits for tasks assessed/performed Arousal/Alertness: Awake/alert Orientation Level: Appears intact for tasks assessed Behavior During Session: [facility] for tasks performed    Mobility  Shoulder Instructions Bed Mobility Supine to Sit: 5: Supervision Transfers Sit to Stand: From bed;From elevated surface;4: Min guard Details for Transfer Assistance: supervision sit to stand from 3:1       Exercises      Balance Static Standing Balance Static Standing - Level of Assistance: 5: Stand by assistance   End of Session OT - End of Session Activity Tolerance: Patient tolerated treatment well Patient left: in chair;with call bell/phone within reach  [place] 01/28/2012, 9:47 AM Lesle Chris, OTR/L [phone] 01/28/2012

## 2012-01-28 NOTE — Progress Notes (Signed)
   CARE MANAGEMENT NOTE 01/28/2012  Patient:  [name], [name]   Account Number:  [id number]  Date Initiated:  01/25/2012  Documentation initiated by:  DAVIS,RHONDA  Subjective/Objective Assessment:   dyspnea     Action/Plan:   snf  01/28/2012 Pt now for d/c to home.   Anticipated DC Date:  01/28/2012   Anticipated DC Plan:  [place] referral  Clinical Social Worker      DC Planning Services  NA      [facility] Choice  NA   Choice offered to / List presented to:  NA   DME arranged  NA      DME agency  NA     [place] arranged  HH-2 PT  HH-3 OT  HH-1 RN      [place].   Status of service:  Completed, signed off Medicare Important Message given?  NA - LOS <3 / Initial given by admissions (If response is "NO", the following Medicare IM given date fields will be blank) Date Medicare IM given:   Date Additional Medicare IM given:    Discharge Disposition:  [place]  Per UR Regulation:  Reviewed for med. necessity/level of care/duration of stay  If discussed at [place] of Stay Meetings, dates discussed:    Comments:  01/28/2012 Met with pt, plan now for d/c to home with [facility], pt selected [facility], states that she has family to assist. All info faxed to [facility] and spoke with Raquel Sarna at [facility]. Jasmine Pang RN MPH case manager  [phone] Rosana Hoes, RN, BSN, CCM: CHART REVIEWED AND UPDATED. NO DISCHARGE NEEDS PRESENT AT THIS TIME. CASE MANAGEMENT [phone]

## 2012-01-28 NOTE — Progress Notes (Addendum)
Patient ID: [name] [name]  female  [id number]    DOB: [birth year]    DOA: 01/24/2012  PCP: Philis Fendt, MD  Subjective:  Feeling better, BS improving, still some expiratory wheezing. No chest pain, nausea vomiting. On Home O2. Cough better  Objective: Weight change: 0.7 kg (1 lb 8.7 oz)  Intake/Output Summary (Last 24 hours) at 01/28/12 1150 Last data filed at 01/28/12 0900  Gross per 24 hour  Intake   1423 ml  Output      0 ml  Net   1423 ml   Blood pressure 161/73, pulse 70, temperature 97.7 F (36.5 C), temperature source Oral, resp. rate 22, height 5\' 6"  (1.676 m), weight 121.3 kg (267 lb 6.7 oz), SpO2 95.00%.  Physical Exam: General: Alert and awake, oriented x3, not in any acute distress. HEENT: anicteric sclera, pupils reactive to light and accommodation, EOMI CVS: S1-S2 clear, no murmur rubs or gallops Chest: Bilateral scattered expiratory wheezing Abdomen: obese, soft nontender, nondistended, normal bowel sounds, no organomegaly Extremities: no cyanosis, clubbing or edema noted bilaterally  Lab Results: Basic Metabolic Panel:  Lab [id number] 0619 01/27/12 0454  NA 134* 135  K 4.0 4.9  CL 99 100  CO2 27 26  GLUCOSE 239* 353*  BUN 23 25*  CREATININE 1.08 1.14*  CALCIUM 9.4 9.0  MG -- --  PHOS -- --   Liver Function Tests:  Lab 01/25/12 0655 01/24/12 1350  AST 20 21  ALT 18 20  ALKPHOS 61 74  BILITOT 0.3 0.3  PROT 7.4 7.7  ALBUMIN 2.9* 3.3*   CBC:  Lab 01/28/12 0619 01/27/12 0454 01/24/12 1350  WBC 12.5* 8.9 --  NEUTROABS -- -- 8.8*  HGB 11.4* 11.2* --  HCT 34.1* 33.7* --  MCV 85.0 85.5 --  PLT 272 273 --   Cardiac Enzymes:  Lab 01/25/12 0655 01/25/12 0112 01/24/12 1920  CKTOTAL -- -- --  CKMB -- -- --  CKMBINDEX -- -- --  TROPONINI <0.30 <0.30 <0.30   CBG:  Lab 01/27/12 2047 01/27/12 1633 01/27/12 1224 01/27/12 0809 01/26/12 2032  GLUCAP 230* 302* 308* 365* 360*     Micro Results: Recent Results (from the past 240 hour(s))  MRSA  PCR SCREENING     Status: Normal   Collection Time   01/24/12  8:17 PM      Component Value Range Status Comment   MRSA by PCR NEGATIVE  NEGATIVE Final   URINE CULTURE     Status: Normal   Collection Time   01/24/12  9:30 PM      Component Value Range Status Comment   Specimen Description URINE, CLEAN CATCH   Final    Special Requests NONE   Final    Culture  Setup Time 01/25/2012 01:54   Final    Colony Count NO GROWTH   Final    Culture NO GROWTH   Final    Report Status 01/25/2012 FINAL   Final     Studies/Results: Dg Chest 1 View  01/24/2012    IMPRESSION:  1.  Mildly indistinct pulmonary vasculature is nonspecific on this semi erect view, but could indicate pulmonary venous hypertension. No overt edema or overt cardiomegaly.  No airspace opacity noted.   Original Report Authenticated By: Carron Curie, M.D.    Ct Angio Chest Pe W/cm &/or Wo Cm  01/24/2012  IMPRESSION: Scattered small pulmonary emboli throughout the right lung segmental branches and also suspected in the left lower lobe segmental vasculature.  Mild left upper lobe ground-glass opacity nonspecific but suspect mild alveolitis.  Bibasilar atelectasis.   Original Report Authenticated By: Jerilynn Mages. Daryll Brod, M.D.     Medications: Scheduled Meds:    . albuterol  2.5 mg Nebulization Q6H WA  . aspirin  325 mg Oral q morning - 10a  . atenolol  100 mg Oral QHS  . budesonide  0.25 mg Nebulization BID  . diltiazem  300 mg Oral q morning - 10a  . enalapril  20 mg Oral q morning - 10a  . enoxaparin (LOVENOX) injection  120 mg Subcutaneous Q12H  . feeding supplement  237 mL Oral BID BM  . furosemide  40 mg Oral Daily  . hydrALAZINE  25 mg Oral Q8H  . HYDROcodone-homatropine  5 mL Oral Q8H  . insulin aspart  0-20 Units Subcutaneous TID WC  . insulin aspart  0-5 Units Subcutaneous QHS  . insulin aspart  10 Units Subcutaneous TID AC  . insulin glargine  45 Units Subcutaneous QHS  . ipratropium  0.5 mg Nebulization Q6H  WA  . isosorbide mononitrate  30 mg Oral Daily  . levofloxacin (LEVAQUIN) IV  500 mg Intravenous Q24H  . pantoprazole  40 mg Oral Q1200  . predniSONE  30 mg Oral Q breakfast  . simvastatin  10 mg Oral q1800  . sodium chloride  3 mL Intravenous Q12H  . warfarin  5 mg Oral ONCE-1800  . warfarin  5 mg Oral ONCE-1800  . Warfarin - Pharmacist Dosing Inpatient   Does not apply q1800  . DISCONTD: albuterol  2.5 mg Nebulization Q6H  . DISCONTD: HYDROcodone-homatropine  5 mL Oral Q12H  . DISCONTD: insulin glargine  40 Units Subcutaneous QHS  . DISCONTD: ipratropium  0.5 mg Nebulization Q6H  . DISCONTD: predniSONE  40 mg Oral Q breakfast   Continuous Infusions:    Assessment/Plan: Principal Problem:  *Acute bilateral Pulmonary embolism: ? Unclear if it is due to long-distance car drive vs ca breast or tamoxifen - Doppler ultrasound done  shows no DVT - 2-D echocardiogram EF 55-60% with grade 1 diastolic dysfunction - Continue full dose Lovenox with Coumadin, day #4 today.  INR therapeutic  - Will complete 1 more day of lovenox tomorrow  - Discussed with Dr Truddie Coco yesterday regarding anticoagulation. Rec'ed current management with Lovenox and Coumadin. Patient needs to be completely off tamoxifen due to hypercoagulability side effects. Per Dr. Truddie Coco, she can be started on Arimidex (no hypercoagulability side effects) by her oncologist outpatient, which does not need to be started in-patient.  Active Problems:  Chronic diastolic heart failure: Currently compensated - Continue all cardiac medications, aspirin, Cardizem, BB, Vasotec and Lasix    COPD (chronic obstructive pulmonary disease) exacerbation; still with wheezing  - Taper prednisone to 30mg  daily, on scheduled albuterol and Atrovent nebs, Levaquin and wean off O2 as needed, Pulmicort.   Diabetes mellitus: Blood sugars uncontrolled secondary to steroids, hemoglobin A1c 9.7 - Increased Lantus to 45 units qhs, meal coverage 10 units  TID.   Hypertension: Continue oral antihypertensives   Leukocytosis: likely sec to steroids   Anemia: Stable  DVT Prophylaxis:On full dose Lovenox and Coumadin  Code Status: Presumed Full code  Disposition: Not medically ready, DC tomorrow, will not need any further lovenox. Will set home PT/OT/RN, d/w RN.   LOS: 4 days   Lashonta Pilling M.D. [facility] 01/28/2012, 11:50 AM Pager: [phone]  If 7PM-7AM, please contact night-coverage [email or web address] Password [email or web address]

## 2012-01-29 DIAGNOSIS — E119 Type 2 diabetes mellitus without complications: Secondary | ICD-10-CM

## 2012-01-29 DIAGNOSIS — I10 Essential (primary) hypertension: Secondary | ICD-10-CM

## 2012-01-29 DIAGNOSIS — J4489 Other specified chronic obstructive pulmonary disease: Secondary | ICD-10-CM

## 2012-01-29 DIAGNOSIS — J40 Bronchitis, not specified as acute or chronic: Secondary | ICD-10-CM

## 2012-01-29 DIAGNOSIS — D72829 Elevated white blood cell count, unspecified: Secondary | ICD-10-CM

## 2012-01-29 DIAGNOSIS — D649 Anemia, unspecified: Secondary | ICD-10-CM

## 2012-01-29 DIAGNOSIS — J449 Chronic obstructive pulmonary disease, unspecified: Secondary | ICD-10-CM

## 2012-01-29 DIAGNOSIS — I2699 Other pulmonary embolism without acute cor pulmonale: Principal | ICD-10-CM

## 2012-01-29 LAB — PROTIME-INR: INR: 2.56 — ABNORMAL HIGH (ref 0.00–1.49)

## 2012-01-29 MED ORDER — BUDESONIDE 0.25 MG/2ML IN SUSP: 0.2500 mg | Freq: Two times a day (BID) | RESPIRATORY_TRACT | Status: DC

## 2012-01-29 MED ORDER — WARFARIN SODIUM 3 MG PO TABS
3.0000 mg | ORAL_TABLET | Freq: Once | ORAL | Status: AC
  Administered 2012-01-29: 3 mg via ORAL
  Filled 2012-01-29: qty 1

## 2012-01-29 MED ORDER — ALBUTEROL SULFATE HFA 108 (90 BASE) MCG/ACT IN AERS: 2.0000 | INHALATION_SPRAY | RESPIRATORY_TRACT | Status: DC | PRN

## 2012-01-29 MED ORDER — HYDRALAZINE HCL 25 MG PO TABS: 25.0000 mg | ORAL_TABLET | Freq: Three times a day (TID) | ORAL | Status: DC

## 2012-01-29 MED ORDER — ALBUTEROL SULFATE (5 MG/ML) 0.5% IN NEBU
2.5000 mg | INHALATION_SOLUTION | Freq: Four times a day (QID) | RESPIRATORY_TRACT | Status: DC
  Administered 2012-01-29: 2.5 mg via RESPIRATORY_TRACT
  Filled 2012-01-29 (×2): qty 0.5

## 2012-01-29 MED ORDER — PREDNISONE (PAK) 10 MG PO TABS: ORAL_TABLET | ORAL | Status: DC

## 2012-01-29 MED ORDER — WARFARIN SODIUM 2.5 MG PO TABS: 2.5000 mg | ORAL_TABLET | Freq: Every day | ORAL | Status: DC

## 2012-01-29 MED ORDER — IPRATROPIUM BROMIDE 0.02 % IN SOLN
0.5000 mg | Freq: Four times a day (QID) | RESPIRATORY_TRACT | Status: DC
  Administered 2012-01-29: 0.5 mg via RESPIRATORY_TRACT
  Filled 2012-01-29 (×2): qty 2.5

## 2012-01-29 MED ORDER — ALBUTEROL SULFATE (5 MG/ML) 0.5% IN NEBU: 2.5000 mg | INHALATION_SOLUTION | Freq: Four times a day (QID) | RESPIRATORY_TRACT | Status: DC

## 2012-01-29 MED ORDER — INSULIN GLARGINE 100 UNIT/ML ~~LOC~~ SOLN: 45.0000 [IU] | Freq: Every day | SUBCUTANEOUS | Status: DC

## 2012-01-29 MED ORDER — HYDROCODONE-HOMATROPINE 5-1.5 MG/5ML PO SYRP: 5.0000 mL | ORAL_SOLUTION | Freq: Three times a day (TID) | ORAL | Status: DC | PRN

## 2012-01-29 MED ORDER — POLYETHYLENE GLYCOL 3350 17 G PO PACK: 17.0000 g | PACK | Freq: Every day | ORAL | Status: DC

## 2012-01-29 NOTE — Progress Notes (Signed)
   CARE MANAGEMENT NOTE 01/29/2012  Patient:  NAO, CALLICOAT   Account Number:  [id number]  Date Initiated:  01/25/2012  Documentation initiated by:  DAVIS,RHONDA  Subjective/Objective Assessment:   dyspnea     Action/Plan:   snf  01/28/2012 Pt now for d/c to home.   Anticipated DC Date:  01/28/2012   Anticipated DC Plan:  [place]  In-house referral  Clinical Social Worker      DC Planning Services  CM consult      [facility] Choice  HOME HEALTH   Choice offered to / List presented to:  C-1 Patient   DME arranged  NA      DME agency  NA     [place] arranged  HH-2 PT  HH-3 OT  HH-1 RN  [place]      [place].   Status of service:  Completed, signed off Medicare Important Message given?  NA - LOS <3 / Initial given by admissions (If response is "NO", the following Medicare IM given date fields will be blank) Date Medicare IM given:   Date Additional Medicare IM given:    Discharge Disposition:  [place]  Per UR Regulation:  Reviewed for med. necessity/level of care/duration of stay  If discussed at [place] of Stay Meetings, dates discussed:    Comments:  01/29/2012 1230 Faxed referral to [facility]. Additional HH added to pt. Pt does not have oxygen with [facility]. Will need portable tank for home. Attempted call to pt contact Ms. Allen. Left message to call NCM. Pt states she has nebulizer at home. Cancelled nebulizer with [facility]. Pt will have family pick up portable tank from home. Jonnie Finner  01/28/2012 Met with pt, plan now for d/c to home with [facility], pt selected [facility], states that she has family to assist. All info faxed to [facility] and spoke with Raquel Sarna at [facility]. Jasmine Pang RN MPH case manager  [phone] Rosana Hoes, RN, BSN, CCM: CHART REVIEWED AND UPDATED. NO DISCHARGE NEEDS PRESENT AT THIS TIME. CASE MANAGEMENT [phone]

## 2012-01-29 NOTE — Discharge Summary (Signed)
Physician Discharge Summary  Patient ID: [name] MRN: [id number] DOB/AGE: 70-Nov-1942 70 y.o.  Admit date: 01/24/2012 Discharge date: 01/29/2012  Primary Care Physician:  Philis Fendt, MD  Discharge Diagnoses:    . acute bilateral Pulmonary embolism .Chronic diastolic heart failure .COPD (chronic obstructive pulmonary disease) with acute exacerbation  .Diabetes mellitus uncontrolled  .Hypertension .Leukocytosis .Bronchitis .Anemia Generalized debility  Consults:  Dr Truddie Coco (Oncology on phone)   Discharge Medications:   Medication List     As of 01/29/2012 11:47 AM    STOP taking these medications         budesonide-formoterol 160-4.5 MCG/ACT inhaler   Commonly known as: SYMBICORT      tamoxifen 20 MG tablet   Commonly known as: NOLVADEX      TAKE these medications         acetaminophen 650 MG CR tablet   Commonly known as: TYLENOL   Take 650 mg by mouth every 8 (eight) hours as needed. pain      albuterol 108 (90 BASE) MCG/ACT inhaler   Commonly known as: PROVENTIL HFA;VENTOLIN HFA   Inhale 2 puffs into the lungs every 4 (four) hours as needed for wheezing.      albuterol (5 MG/ML) 0.5% nebulizer solution   Commonly known as: PROVENTIL   Take 0.5 mLs (2.5 mg total) by nebulization 4 (four) times daily.      aspirin 325 MG tablet   Take 325 mg by mouth every morning.      atenolol 100 MG tablet   Commonly known as: TENORMIN   Take 100 mg by mouth at bedtime.      budesonide 0.25 MG/2ML nebulizer solution   Commonly known as: PULMICORT   Take 2 mLs (0.25 mg total) by nebulization 2 (two) times daily.      clobetasol ointment 0.05 %   Commonly known as: TEMOVATE   Apply 1 application topically daily.      desonide 0.05 % cream   Commonly known as: DESOWEN   Apply 1 application topically daily.      dexlansoprazole 60 MG capsule   Commonly known as: DEXILANT   Take 60 mg by mouth 2 (two) times daily.      diclofenac sodium 1 % Gel   Commonly known as: VOLTAREN   Apply 1 application topically daily.      diltiazem 300 MG 24 hr capsule   Commonly known as: TIAZAC   Take 300 mg by mouth every morning.      enalapril 20 MG tablet   Commonly known as: VASOTEC   Take 20 mg by mouth every morning.      fluocinonide cream 0.05 %   Commonly known as: LIDEX   Apply 1 application topically 2 (two) times daily.      furosemide 40 MG tablet   Commonly known as: LASIX   Take 40 mg by mouth every morning.      hydrALAZINE 25 MG tablet   Commonly known as: APRESOLINE   Take 1 tablet (25 mg total) by mouth 3 (three) times daily.      HYDROcodone-homatropine 5-1.5 MG/5ML syrup   Commonly known as: HYCODAN   Take 5 mLs by mouth every 8 (eight) hours as needed for cough.      insulin aspart 100 UNIT/ML injection   Commonly known as: novoLOG   Inject 10 Units into the skin 3 (three) times daily before meals.      insulin glargine 100 UNIT/ML injection  Commonly known as: LANTUS   Inject 45 Units into the skin at bedtime.      isosorbide mononitrate 30 MG 24 hr tablet   Commonly known as: IMDUR   Take 30 mg by mouth every morning.      polyethylene glycol packet   Commonly known as: MIRALAX / GLYCOLAX   Take 17 g by mouth daily.      pravastatin 40 MG tablet   Commonly known as: PRAVACHOL   Take 40 mg by mouth every morning.      predniSONE 10 MG tablet   Commonly known as: STERAPRED UNI-PAK   Prednisone dosing: Take Prednisone 30mg  (3 tabs) x 2 days, then 20mg  (2 tabs) x 3days, then 10mg  (1 tab) x 3days, then OFF.    Dispense: 15 tabs, refills: None      warfarin 2.5 MG tablet   Commonly known as: COUMADIN   Take 1 tablet (2.5 mg total) by mouth daily. Primary MD to adjust dose according to PT/INR         Brief H and P: For complete details please refer to admission H and P, but in brief [name] [name] is a 71 y.o. female with h/o Hypertension, COD, diabetes Mellitus, Chronic diastolic heart failure,  presented with shortness of breath for more than 4 weeks , worsened over the last 2 days, associated with dry cough. No fever or chills. Patient had orthopnea and palpitations . No syncope. On arrival she was found to be tachycardic with HR in 130's, tachypneic, and hypoxemic requiring 4 liters of oxygen. She at baseline uses 2liters of nasal oxygen at home for COPD. Since she was tachycardic, tachypneic, and hypoxic, and she was on tamoxifen, had a recent car travel to [place] and back 4 weeks ago, and has a family h/o blood clots, CT angio gram of the chest was obtained and was found to have multiple PE's. She was started on anticoagulation and admitted to step down unit.    Hospital Course:   Acute bilateral Pulmonary embolism: ? Unclear if it is due to long-distance car drive vs ca breast or tamoxifen. Patient was initially admitted to step down unit and transferred to monitored room the next day. Doppler ultrasound done showed no DVT.  2-D echocardiogram showed EF 55-60% with grade 1 diastolic dysfunction. Patient was started on full dose therapeutic Lovenox with Coumadin. Her INR has been therapeutic now for 2 days. Patient does not require any further lovenox shots. I discussed with Dr Truddie Coco (Oncology) regarding anticoagulation. Per Dr. Julien Girt recommendations, patient needs to be completely off tamoxifen due to hypercoagulability side effects. Per Dr. Truddie Coco, she can be started on Arimidex (no hypercoagulability side effects) by her oncologist outpatient, which does not need to be started in-patient.   Chronic diastolic heart failure: Remained compensated, patient was continued on all the cardiac medications, aspirin, Cardizem, BB, Vasotec and Lasix   COPD (chronic obstructive pulmonary disease) exacerbation: Patient was placed on prednisone taper. She was placed on scheduled albuterol and Atrovent nebs, Levaquin and wean off O2 as needed. Symbicort inhaler was DC'ed and patient was placed on  Pulmicort.   Diabetes mellitus: Blood sugars remained somewhat uncontrolled secondary to steroids. Patient has poorly controlled DM outpatient with hemoglobin A1c 9.7. Lantus was increased to 45 units qhs and meal coverage 10 units TID.   Generalized debility: Home health was arranged for home PT, OT, home health aid, nursing social worker followup. Patient was explained all the recommendations regarding tamoxifen as  well as PT/INR lab on 02/01/2012 which will be collected by home health nurse and results will faxed to Dr. Jeanie Cooks.     Day of Discharge BP 132/60  Pulse 58  Temp 98.2 F (36.8 C) (Oral)  Resp 18  Ht 5\' 6"  (1.676 m)  Wt 121.1 kg (266 lb 15.6 oz)  BMI 43.09 kg/m2  SpO2 97%  Physical Exam: General: Alert and awake oriented x3 not in any acute distress. HEENT: anicteric sclera, pupils reactive to light and accommodation CVS: S1-S2 clear no murmur rubs or gallops Chest: clear to auscultation bilaterally, no wheezing rales or rhonchi Abdomen: soft nontender, nondistended, normal bowel sounds, no organomegaly Extremities: no cyanosis, clubbing or edema noted bilaterally Neuro: Cranial nerves II-XII intact, no focal neurological deficits   The results of significant diagnostics from this hospitalization (including imaging, microbiology, ancillary and laboratory) are listed below for reference.    LAB RESULTS: Basic Metabolic Panel:  Lab [id number] 01/27/12 0454  NA 134* 135  K 4.0 4.9  CL 99 100  CO2 27 26  GLUCOSE 239* 353*  BUN 23 25*  CREATININE 1.08 1.14*  CALCIUM 9.4 9.0  MG -- --  PHOS -- --   Liver Function Tests:  Lab 01/25/12 0655 01/24/12 1350  AST 20 21  ALT 18 20  ALKPHOS 61 74  BILITOT 0.3 0.3  PROT 7.4 7.7  ALBUMIN 2.9* 3.3*   CBC:  Lab 01/28/12 0619 01/27/12 0454 01/24/12 1350  WBC 12.5* 8.9 --  NEUTROABS -- -- 8.8*  HGB 11.4* 11.2* --  HCT 34.1* 33.7* --  MCV 85.0 -- --  PLT 272 273 --   Cardiac Enzymes:  Lab 01/25/12 0655  01/25/12 0112  CKTOTAL -- --  CKMB -- --  CKMBINDEX -- --  TROPONINI <0.30 <0.30   BNP: No components found with this basename: POCBNP:2 CBG:  Lab 01/29/12 0800 01/28/12 2123  GLUCAP 188* 242*    Significant Diagnostic Studies:  Dg Chest 1 View  01/24/2012  *RADIOLOGY REPORT*  Clinical Data: Cough, fever - sob - hx asthma  CHEST - 1 VIEW  Comparison: 01/27/2010; 01/25/2010  Findings: Heart size within normal limits for projection. Indistinct pulmonary vasculature noted. Technical factors related to patient body habitus reduce diagnostic sensitivity and specificity.  No definite airspace opacity observed.  Degenerative glenohumeral arthropathy noted bilaterally.  IMPRESSION:  1.  Mildly indistinct pulmonary vasculature is nonspecific on this semi erect view, but could indicate pulmonary venous hypertension. No overt edema or overt cardiomegaly.  No airspace opacity noted.   Original Report Authenticated By: Carron Curie, M.D.    Ct Angio Chest Pe W/cm &/or Wo Cm  01/24/2012   IMPRESSION: Scattered small pulmonary emboli throughout the right lung segmental branches and also suspected in the left lower lobe segmental vasculature.  Mild left upper lobe ground-glass opacity nonspecific but suspect mild alveolitis.  Bibasilar atelectasis.   Original Report Authenticated By: Jerilynn Mages. Daryll Brod, M.D.      Disposition and Follow-up:     Discharge Orders    Future Orders Please Complete By Expires   Diet - low sodium heart healthy, diabetic      Increase activity slowly      Discharge instructions      Comments:   1) Please check PT/INR labs on Wednesday, 02/01/12, your coumadin dose may need to be adjusted by your doctor 2) Please donot take tamoxifen. Follow-up with your oncologist or primary doctor asap.       DISPOSITION:  Home with home health PT, OT, aide, RN and social worker visit  DIET: diabetic diet ACTIVITY: as tolerated  TESTS THAT NEED FOLLOW-UP PT, INR on  02/01/12  DISCHARGE FOLLOW-UP Follow-up Information    Follow up with Nolene Ebbs A, MD. Schedule an appointment as soon as possible for a visit in 10 days. (for hospital follow-up, labs PT/INR to be drawn on 02/01/12)    Contact information:   [address] [place] [address] [phone]       Follow up with [place] RN, Physical Therapy, SW, aide and Occupational Therapy)    Contact information:   [phone]         Time spent on Discharge: 47 mins  Signed:   Rawson Minix M.D. [facility] 01/29/2012, 11:47 AM Pager: [phone]

## 2012-01-29 NOTE — Progress Notes (Signed)
ANTICOAGULATION CONSULT NOTE - Follow up [place] for Lovenox, Warfarin Indication: pulmonary embolism   Allergies  Allergen Reactions  . Penicillins Other (See Comments)    Swells up  . Peach (Prunus Persica) Hives  . Shellfish Allergy Hives    Patient Measurements: 10/9 Weight 120.2kg  Vital Signs: Temp: 98.2 F (36.8 C) (10/13 0537) Temp src: Oral (10/13 0537) BP: 132/60 mmHg (10/13 0537) Pulse Rate: 58  (10/13 0537)  Labs:  Basename 01/29/12 0517 01/28/12 0619 01/27/12 0454  HGB -- 11.4* 11.2*  HCT -- 34.1* 33.7*  PLT -- 272 273  APTT -- -- --  LABPROT 26.3* 22.0* 18.0*  INR 2.56* 2.01* 1.54*  HEPARINUNFRC -- -- --  CREATININE -- 1.08 1.14*  CKTOTAL -- -- --  CKMB -- -- --  TROPONINI -- -- --    Estimated Creatinine Clearance: 64.3 ml/min (by C-G formula based on Cr of 1.08).   Medical History: Past Medical History  Diagnosis Date  . COPD (chronic obstructive pulmonary disease)   . Diabetes mellitus without complication   . CHF (congestive heart failure)   . Lupus   . Cancer     breast cancer    Medications:  Scheduled:     . albuterol  2.5 mg Nebulization Q6H  . aspirin  325 mg Oral q morning - 10a  . atenolol  100 mg Oral QHS  . budesonide  0.25 mg Nebulization BID  . diltiazem  300 mg Oral q morning - 10a  . enalapril  20 mg Oral q morning - 10a  . enoxaparin (LOVENOX) injection  120 mg Subcutaneous Q12H  . feeding supplement  237 mL Oral BID BM  . furosemide  40 mg Oral Daily  . hydrALAZINE  25 mg Oral Q8H  . HYDROcodone-homatropine  5 mL Oral TID  . insulin aspart  0-20 Units Subcutaneous TID WC  . insulin aspart  0-5 Units Subcutaneous QHS  . insulin aspart  10 Units Subcutaneous TID AC  . insulin glargine  45 Units Subcutaneous QHS  . ipratropium  0.5 mg Nebulization Q6H  . isosorbide mononitrate  30 mg Oral Daily  . levofloxacin (LEVAQUIN) IV  500 mg Intravenous Q24H  . pantoprazole  40 mg Oral Q1200  .  polyethylene glycol  17 g Oral Daily  . predniSONE  30 mg Oral Q breakfast  . simvastatin  10 mg Oral q1800  . sodium chloride  3 mL Intravenous Q12H  . warfarin  5 mg Oral ONCE-1800  . Warfarin - Pharmacist Dosing Inpatient   Does not apply q1800  . DISCONTD: albuterol  2.5 mg Nebulization Q6H WA  . DISCONTD: HYDROcodone-homatropine  5 mL Oral Q12H  . DISCONTD: insulin glargine  40 Units Subcutaneous QHS  . DISCONTD: ipratropium  0.5 mg Nebulization Q6H WA  . DISCONTD: predniSONE  40 mg Oral Q breakfast   Infusions:   PRN: acetaminophen, albuterol, hydrALAZINE, menthol-cetylpyridinium, ondansetron (ZOFRAN) IV, ondansetron, oxyCODONE  Assessment:  71 yo F presented to ER 10/8 with SOB, found to have scattered small pulmonary emboli throughout the right lung on CT angio chest.  Started on Lovenox and warfarin 10/8.  Doppler negative for DVT.  Patient had recent long-distance car travel but also has h/o breast CA and on tamoxifen PTA.  Tamoxifen currently on hold.  F/u plan for continued tamoxifen use.  Drug interaction noted: Levaquin and warfarin concurrent therapy can increase INR  INR therapeutic today and trending up, will give lower dose 3  mg today.  Patient has completed 5/5 days minimum overlap with coumadin/lovenox and INR today is therapeutic, recommend discontinuing Lovenox today  CBC stable  Warfarin education completed   Goal of Therapy:  INR 2-3 Anti-Xa level 0.6-1.2 units/ml 4hrs after LMWH dose given Monitor platelets by anticoagulation protocol: Yes   Plan:   Recommend discontinuing Lovenox today, will f/u with MD today.   Repeat warfarin 3 mg PO today at 1800  Daily PT/INR, CBC Q72h, follow renal function   Darnella Zeiter, Gaye Alken PharmD Pager #: [phone] 9:03 AM 01/29/2012

## 2012-04-23 ENCOUNTER — Other Ambulatory Visit: Payer: Self-pay | Admitting: Internal Medicine

## 2012-04-23 DIAGNOSIS — Z1231 Encounter for screening mammogram for malignant neoplasm of breast: Secondary | ICD-10-CM

## 2012-05-17 ENCOUNTER — Ambulatory Visit: Payer: Medicare Other

## 2012-05-18 ENCOUNTER — Ambulatory Visit
Admission: RE | Admit: 2012-05-18 | Discharge: 2012-05-18 | Disposition: A | Discharge status: Home / Self Care | Payer: Medicare Other | Source: Ambulatory Visit | Attending: Internal Medicine | Admitting: Internal Medicine

## 2012-05-18 DIAGNOSIS — Z1231 Encounter for screening mammogram for malignant neoplasm of breast: Secondary | ICD-10-CM

## 2012-05-22 ENCOUNTER — Other Ambulatory Visit: Payer: Self-pay | Admitting: Internal Medicine

## 2012-05-22 DIAGNOSIS — R928 Other abnormal and inconclusive findings on diagnostic imaging of breast: Secondary | ICD-10-CM

## 2012-06-01 ENCOUNTER — Other Ambulatory Visit: Payer: Medicare Other

## 2012-06-07 ENCOUNTER — Ambulatory Visit
Admission: RE | Admit: 2012-06-07 | Discharge: 2012-06-07 | Disposition: A | Discharge status: Home / Self Care | Payer: Medicare Other | Source: Ambulatory Visit | Attending: Internal Medicine | Admitting: Internal Medicine

## 2012-07-02 ENCOUNTER — Encounter: Payer: Self-pay | Admitting: *Deleted

## 2012-07-11 ENCOUNTER — Encounter: Payer: Self-pay | Admitting: Cardiovascular Disease

## 2012-07-20 ENCOUNTER — Emergency Department (HOSPITAL_COMMUNITY)
Admission: EM | Admit: 2012-07-20 | Discharge: 2012-07-20 | Disposition: A | Discharge status: Home / Self Care | Payer: Medicare Other | Attending: Emergency Medicine | Admitting: Emergency Medicine

## 2012-07-20 ENCOUNTER — Encounter (HOSPITAL_COMMUNITY): Payer: Self-pay | Admitting: Emergency Medicine

## 2012-07-20 ENCOUNTER — Emergency Department (HOSPITAL_COMMUNITY): Payer: Medicare Other

## 2012-07-20 DIAGNOSIS — Z7901 Long term (current) use of anticoagulants: Secondary | ICD-10-CM | POA: Insufficient documentation

## 2012-07-20 DIAGNOSIS — I2699 Other pulmonary embolism without acute cor pulmonale: Secondary | ICD-10-CM | POA: Insufficient documentation

## 2012-07-20 DIAGNOSIS — Z853 Personal history of malignant neoplasm of breast: Secondary | ICD-10-CM | POA: Insufficient documentation

## 2012-07-20 DIAGNOSIS — S79919A Unspecified injury of unspecified hip, initial encounter: Secondary | ICD-10-CM | POA: Insufficient documentation

## 2012-07-20 DIAGNOSIS — N390 Urinary tract infection, site not specified: Secondary | ICD-10-CM | POA: Insufficient documentation

## 2012-07-20 DIAGNOSIS — R0989 Other specified symptoms and signs involving the circulatory and respiratory systems: Secondary | ICD-10-CM | POA: Insufficient documentation

## 2012-07-20 DIAGNOSIS — Y92009 Unspecified place in unspecified non-institutional (private) residence as the place of occurrence of the external cause: Secondary | ICD-10-CM | POA: Insufficient documentation

## 2012-07-20 DIAGNOSIS — R0602 Shortness of breath: Secondary | ICD-10-CM | POA: Insufficient documentation

## 2012-07-20 DIAGNOSIS — J4489 Other specified chronic obstructive pulmonary disease: Secondary | ICD-10-CM | POA: Insufficient documentation

## 2012-07-20 DIAGNOSIS — J449 Chronic obstructive pulmonary disease, unspecified: Secondary | ICD-10-CM | POA: Insufficient documentation

## 2012-07-20 DIAGNOSIS — Y9301 Activity, walking, marching and hiking: Secondary | ICD-10-CM | POA: Insufficient documentation

## 2012-07-20 DIAGNOSIS — I509 Heart failure, unspecified: Secondary | ICD-10-CM | POA: Insufficient documentation

## 2012-07-20 DIAGNOSIS — M25552 Pain in left hip: Secondary | ICD-10-CM

## 2012-07-20 DIAGNOSIS — G4730 Sleep apnea, unspecified: Secondary | ICD-10-CM | POA: Insufficient documentation

## 2012-07-20 DIAGNOSIS — N289 Disorder of kidney and ureter, unspecified: Secondary | ICD-10-CM | POA: Insufficient documentation

## 2012-07-20 DIAGNOSIS — Z7982 Long term (current) use of aspirin: Secondary | ICD-10-CM | POA: Insufficient documentation

## 2012-07-20 DIAGNOSIS — Z79899 Other long term (current) drug therapy: Secondary | ICD-10-CM | POA: Insufficient documentation

## 2012-07-20 DIAGNOSIS — E119 Type 2 diabetes mellitus without complications: Secondary | ICD-10-CM | POA: Insufficient documentation

## 2012-07-20 DIAGNOSIS — Z880 Allergy status to penicillin: Secondary | ICD-10-CM | POA: Insufficient documentation

## 2012-07-20 DIAGNOSIS — S6990XA Unspecified injury of unspecified wrist, hand and finger(s), initial encounter: Secondary | ICD-10-CM | POA: Insufficient documentation

## 2012-07-20 DIAGNOSIS — M79641 Pain in right hand: Secondary | ICD-10-CM

## 2012-07-20 DIAGNOSIS — M329 Systemic lupus erythematosus, unspecified: Secondary | ICD-10-CM | POA: Insufficient documentation

## 2012-07-20 DIAGNOSIS — W1809XA Striking against other object with subsequent fall, initial encounter: Secondary | ICD-10-CM | POA: Insufficient documentation

## 2012-07-20 DIAGNOSIS — S79929A Unspecified injury of unspecified thigh, initial encounter: Secondary | ICD-10-CM | POA: Insufficient documentation

## 2012-07-20 DIAGNOSIS — Z794 Long term (current) use of insulin: Secondary | ICD-10-CM | POA: Insufficient documentation

## 2012-07-20 LAB — URINALYSIS, ROUTINE W REFLEX MICROSCOPIC
Ketones, ur: NEGATIVE mg/dL
Nitrite: NEGATIVE
Protein, ur: NEGATIVE mg/dL
Urobilinogen, UA: 0.2 mg/dL (ref 0.0–1.0)
pH: 5 (ref 5.0–8.0)

## 2012-07-20 LAB — POCT I-STAT, CHEM 8
BUN: 26 mg/dL — ABNORMAL HIGH (ref 6–23)
Calcium, Ion: 1.2 mmol/L (ref 1.13–1.30)
Chloride: 103 mEq/L (ref 96–112)
Creatinine, Ser: 1.5 mg/dL — ABNORMAL HIGH (ref 0.50–1.10)
Glucose, Bld: 296 mg/dL — ABNORMAL HIGH (ref 70–99)
TCO2: 32 mmol/L (ref 0–100)

## 2012-07-20 LAB — URINE MICROSCOPIC-ADD ON

## 2012-07-20 MED ORDER — CIPROFLOXACIN HCL 500 MG PO TABS: 500.0000 mg | ORAL_TABLET | Freq: Two times a day (BID) | ORAL | Status: DC

## 2012-07-20 NOTE — ED Notes (Signed)
Patient was walking in kitchen and fell.  Denies dizziness or tripping over anything.  No LOC.  Landed with right hand underneath her.  She is c/o right hand and left hip pain. Patient not sure why she fell, " I just went down."

## 2012-07-20 NOTE — ED Provider Notes (Signed)
Patient with fall earlier today, complaining of hip pain, on my exam patient has good range of motion of both hips, clear lungs, clear heart sounds, right bundle branch block on EKG, imaging negative for acute fractures, does have a urinary infection with renal insufficiency and hyperglycemia, overall patient appears stable for discharge, will need to followup for recheck of these tests, antibiotic to be started. Patient presses her understanding and agrees to followup as indicated.   Medical screening examination/treatment/procedure(s) were conducted as a shared visit with non-physician practitioner(s) and myself.  I personally evaluated the patient during the encounter    [name], MD 07/20/12 2037

## 2012-07-20 NOTE — ED Provider Notes (Signed)
History    This chart was scribed for non-physician practitioner working with [name] Acosta, MD by Ludger Nutting, ED Scribe. This patient was seen in room WTR8/WTR8 and the patient's care was started at 1655.   CSN: [id number]  Arrival date & time 07/20/12  1655   First MD Initiated Contact with Patient 07/20/12 1718      Chief Complaint  Patient presents with  . Fall  . Hand Pain    right  . Hip Pain    left     The history is provided by the patient. No language interpreter was used.    [name] [name] is a 72 y.o. female who presents to the Emergency Department complaining of new, constant, unchanged left hip pain and right hand pain after a fall last night. Pt reports she was walking and she fell down. She denies tripping over anything, losing her footing, or feeling dizzy prior to the fall. She denies LOC and states she remembers everything from the fall. States she hasn't fallen like this in the past. Pt states she fell into the dining table and onto the floor.  She took tylenol last night with minimal relief. She denies palpations, lightheadedness.   Pt denies smoking and alcohol use.  Past Medical History  Diagnosis Date  . COPD (chronic obstructive pulmonary disease)   . Diabetes mellitus without complication   . CHF (congestive heart failure)   . Lupus   . Cancer     breast cancer  . Pulmonary embolism 01/2012     CT showed multi small PE and coumadized   . Sleep apnea 2010    C-PAP machine    Past Surgical History  Procedure Laterality Date  . Mastectomy      right side  . Abdominal hysterectomy  1976  . Exploratory laparotomy    . Back surgery    . Doppler echocardiography  01/25/2012    EF 55 TO 60%; LV norm.  Pryor Curia myoview  05/02/2008    EF 67% ; LV norm  . Lower extrem. venous doppler  01/25/2012     neg.    No family history on file.  History  Substance Use Topics  . Smoking status: Never Smoker   . Smokeless tobacco: Never Used  . Alcohol  Use: No    No OB history provided.   Review of Systems  Musculoskeletal: Positive for arthralgias (hip and hand pain).  Neurological: Negative for syncope, light-headedness and headaches.  All other systems reviewed and are negative.    Allergies  Penicillins; Peach; and Shellfish allergy  Home Medications   Current Outpatient Rx  Name  Route  Sig  Dispense  Refill  . acetaminophen (TYLENOL) 650 MG CR tablet   Oral   Take 650 mg by mouth every 8 (eight) hours as needed. pain         . albuterol (PROVENTIL HFA;VENTOLIN HFA) 108 (90 BASE) MCG/ACT inhaler   Inhalation   Inhale 2 puffs into the lungs every 4 (four) hours as needed for wheezing.   2 Inhaler   3   . albuterol (PROVENTIL) (5 MG/ML) 0.5% nebulizer solution   Nebulization   Take 0.5 mLs (2.5 mg total) by nebulization 4 (four) times daily.   20 mL   3   . aspirin 325 MG tablet   Oral   Take 325 mg by mouth every morning.         Marland Kitchen atenolol (TENORMIN) 100 MG tablet  Oral   Take 100 mg by mouth at bedtime.         . budesonide (PULMICORT) 0.25 MG/2ML nebulizer solution   Nebulization   Take 2 mLs (0.25 mg total) by nebulization 2 (two) times daily.   60 mL   3   . clobetasol ointment (TEMOVATE) 0.05 %   Topical   Apply 1 application topically daily.          Marland Kitchen desonide (DESOWEN) 0.05 % cream   Topical   Apply 1 application topically daily.          Marland Kitchen dexlansoprazole (DEXILANT) 60 MG capsule   Oral   Take 60 mg by mouth 2 (two) times daily.         . diclofenac sodium (VOLTAREN) 1 % GEL   Topical   Apply 1 application topically daily.          Marland Kitchen diltiazem (TIAZAC) 300 MG 24 hr capsule   Oral   Take 300 mg by mouth every morning.         . enalapril (VASOTEC) 20 MG tablet   Oral   Take 20 mg by mouth every morning.         . fluocinonide cream (LIDEX) 0.05 %   Topical   Apply 1 application topically 2 (two) times daily.          . furosemide (LASIX) 40 MG tablet    Oral   Take 40 mg by mouth every morning.         . hydrALAZINE (APRESOLINE) 25 MG tablet   Oral   Take 1 tablet (25 mg total) by mouth 3 (three) times daily.   90 tablet   3   . HYDROcodone-homatropine (HYCODAN) 5-1.5 MG/5ML syrup   Oral   Take 5 mLs by mouth every 8 (eight) hours as needed for cough.   120 mL   0   . insulin aspart (NOVOLOG) 100 UNIT/ML injection   Subcutaneous   Inject 10 Units into the skin 3 (three) times daily before meals.         . insulin glargine (LANTUS) 100 UNIT/ML injection   Subcutaneous   Inject 45 Units into the skin at bedtime.   10 mL      . isosorbide mononitrate (IMDUR) 30 MG 24 hr tablet   Oral   Take 30 mg by mouth every morning.         . polyethylene glycol (MIRALAX / GLYCOLAX) packet   Oral   Take 17 g by mouth daily.   30 each   1   . pravastatin (PRAVACHOL) 40 MG tablet   Oral   Take 40 mg by mouth every morning.         . predniSONE (STERAPRED UNI-PAK) 10 MG tablet      Prednisone dosing: Take Prednisone 30mg  (3 tabs) x 2 days, then 20mg  (2 tabs) x 3days, then 10mg  (1 tab) x 3days, then OFF.  Dispense: 15 tabs, refills: None   15 tablet   0   . warfarin (COUMADIN) 2.5 MG tablet   Oral   Take 1 tablet (2.5 mg total) by mouth daily. Primary MD to adjust dose according to PT/INR   20 tablet   0     BP 132/55  Pulse 66  Temp(Src) 97.5 F (36.4 C) (Oral)  Resp 16  Wt 262 lb (118.842 kg)  BMI 42.31 kg/m2  SpO2 93%  Physical Exam  Nursing note and vitals reviewed. Constitutional:  She is oriented to person, place, and time. She appears well-developed and well-nourished. No distress.  HENT:  Head: Normocephalic and atraumatic.  Eyes: EOM are normal.  Neck: Neck supple. No tracheal deviation present.  Cardiovascular: Normal rate.   Pulmonary/Chest: Effort normal. No respiratory distress.  Distant lung sounds.   Musculoskeletal: Normal range of motion.  Tenderness to palpation of the right second  digit between the PIP joint and MCP joint. No swelling with full active ROM.   Wrist, elbow, and shoulder have full active ROM.   Neurological: She is alert and oriented to person, place, and time.  Skin: Skin is warm and dry.  Psychiatric: She has a normal mood and affect. Her behavior is normal.    ED Course  Procedures (including critical care time)  DIAGNOSTIC STUDIES: Oxygen Saturation is 98% on room air, normal by my interpretation.    COORDINATION OF CARE: 5:44 PM Discussed treatment plan with pt at bedside and pt agreed to plan.    5:59 PM Upon recheck, pt states she is slightly SOB and states she uses oxygen at home. Pt will be given oxygen.   Labs Reviewed  URINALYSIS, ROUTINE W REFLEX MICROSCOPIC - Abnormal; Notable for the following:    Color, Urine AMBER (*)    APPearance CLOUDY (*)    Specific Gravity, Urine 1.031 (*)    Bilirubin Urine SMALL (*)    Leukocytes, UA LARGE (*)    All other components within normal limits  URINE MICROSCOPIC-ADD ON - Abnormal; Notable for the following:    Squamous Epithelial / LPF FEW (*)    Bacteria, UA MANY (*)    All other components within normal limits  POCT I-STAT, CHEM 8 - Abnormal; Notable for the following:    BUN 26 (*)    Creatinine, Ser 1.50 (*)    Glucose, Bld 296 (*)    All other components within normal limits  URINE CULTURE   Dg Hip Complete Left  07/20/2012  *RADIOLOGY REPORT*  Clinical Data: Fall.  Hand pain.  Hip pain.  Groin pain.  History of lupus.  LEFT HIP - COMPLETE 2+ VIEW  Comparison: None.  Findings: There are postoperative changes in the lower lumbar spine.  Degenerative changes are seen in the lumbar spine and both SI joints as well as the symphysis pubis.  Degenerative changes are seen in the hips bilaterally, right greater than left.  There is no evidence for acute fracture or subluxation.  Regional bowel gas pattern is nonobstructive.  IMPRESSION:  1.  Degenerative changes. 2. No evidence for acute   abnormality.   Original Report Authenticated By: Nolon Nations, M.D.    Dg Hand Complete Right  07/20/2012  *RADIOLOGY REPORT*  Clinical Data: Fall.  Pain across the metacarpal phalangeal joints, worse at the second.  RIGHT HAND - COMPLETE 3+ VIEW  Comparison: None.  Findings: There are degenerative changes in the distal interphalangeal joints.  No evidence for acute fracture or subluxation.  No radiopaque foreign body or soft tissue gas identified.  There is mild soft tissue swelling along the dorsum of the metacarpal phalangeal joints.  IMPRESSION: No evidence for acute osseous abnormality. Mild soft tissue swelling.  See above.   Original Report Authenticated By: Nolon Nations, M.D.      1. Hand pain, right   2. Hip pain, left   3. Renal insufficiency   4. UTI (lower urinary tract infection)    Patient seen and examined. Work-up initiated. Medications ordered. Discussed patient  with Dr. Sabra Heck. EKG, orthostatics, istat, UA ordered.   Vital signs reviewed and are as follows: Filed Vitals:   07/20/12 2039  BP: 132/55  Pulse: 66  Temp: 97.5 F (36.4 C)  Resp:     Patient seen by Dr. Sabra Heck. Results reviewed with patient. Will treat UTI with cipro. Patient urged to f/u with PCP.     Date: 08/02/2012  Rate: 77  Rhythm: normal sinus rhythm  QRS Axis: right  Intervals: normal  ST/T Wave abnormalities: normal  Conduction Disutrbances:right bundle branch block  Narrative Interpretation:   Old EKG Reviewed: unchanged from 01/2012    MDM  Pain after fall. X-rays negative. Patient cannot give good reason as to why she fell. Work-up is unrevealing. No palpitations or CP. UA with UTI. X-rays neg. Istat at baseline, hyperglycemia noted without signs of acidosis. EKG baseline. Patient appears well. Feel she is safe for d/c with PCP f/u.       I personally performed the services described in this documentation, which was scribed in my presence. The recorded information has been  reviewed and is accurate.   Carlisle Cater, PA-C 08/02/12 [phone]

## 2012-07-22 LAB — URINE CULTURE: Colony Count: 4000

## 2012-08-03 NOTE — ED Provider Notes (Signed)
Medical screening examination/treatment/procedure(s) were conducted as a shared visit with non-physician practitioner(s) and myself.  I personally evaluated the patient during the encounter  Please see my separate respective documentation pertaining to this patient encounter   [name], MD 08/03/12 1045

## 2012-09-26 ENCOUNTER — Other Ambulatory Visit: Payer: Self-pay | Admitting: Internal Medicine

## 2012-09-26 DIAGNOSIS — M810 Age-related osteoporosis without current pathological fracture: Secondary | ICD-10-CM

## 2012-10-01 ENCOUNTER — Other Ambulatory Visit: Payer: Self-pay | Admitting: Gastroenterology

## 2012-10-02 ENCOUNTER — Ambulatory Visit
Admission: RE | Admit: 2012-10-02 | Discharge: 2012-10-02 | Disposition: A | Discharge status: Home / Self Care | Payer: Medicare Other | Source: Ambulatory Visit | Attending: Internal Medicine | Admitting: Internal Medicine

## 2012-10-02 DIAGNOSIS — M810 Age-related osteoporosis without current pathological fracture: Secondary | ICD-10-CM

## 2013-03-21 ENCOUNTER — Ambulatory Visit: Payer: Medicare Other | Admitting: Cardiovascular Disease

## 2013-04-04 ENCOUNTER — Other Ambulatory Visit: Payer: Self-pay

## 2013-04-04 DIAGNOSIS — Z853 Personal history of malignant neoplasm of breast: Secondary | ICD-10-CM

## 2013-04-04 DIAGNOSIS — Z1231 Encounter for screening mammogram for malignant neoplasm of breast: Secondary | ICD-10-CM

## 2013-04-05 ENCOUNTER — Encounter: Payer: Self-pay | Admitting: Cardiovascular Disease

## 2013-04-05 ENCOUNTER — Ambulatory Visit (INDEPENDENT_AMBULATORY_CARE_PROVIDER_SITE_OTHER): Payer: Medicare Other | Admitting: Cardiovascular Disease

## 2013-04-05 VITALS — BP 138/70 | HR 76 | Ht 66.5 in | Wt 264.7 lb

## 2013-04-05 DIAGNOSIS — E785 Hyperlipidemia, unspecified: Secondary | ICD-10-CM

## 2013-04-05 DIAGNOSIS — I2699 Other pulmonary embolism without acute cor pulmonale: Secondary | ICD-10-CM

## 2013-04-05 DIAGNOSIS — I10 Essential (primary) hypertension: Secondary | ICD-10-CM

## 2013-04-05 DIAGNOSIS — M329 Systemic lupus erythematosus, unspecified: Secondary | ICD-10-CM

## 2013-04-05 DIAGNOSIS — E1169 Type 2 diabetes mellitus with other specified complication: Secondary | ICD-10-CM | POA: Insufficient documentation

## 2013-04-05 NOTE — Assessment & Plan Note (Signed)
On statin therapy followed by her PCP 

## 2013-04-05 NOTE — Patient Instructions (Signed)
Your physician wants you to follow-up in: 1 year with Dr Berry. You will receive a reminder letter in the mail two months in advance. If you don't receive a letter, please call our office to schedule the follow-up appointment.  

## 2013-04-05 NOTE — Assessment & Plan Note (Signed)
Well-controlled on current medications 

## 2013-04-05 NOTE — Progress Notes (Signed)
04/05/2013 [name] [name]   11/25/1940  [id number]  Primary Physician Philis Fendt, MD Primary Cardiologist: Lorretta Harp MD Renae Gloss   HPI:  The patient is a 72 year old severely overweight widowed [place] American female, mother of 23 and grandmother to 2 grandchildren, whom I last saw 2 years ago. She has a history of congestive heart failure probably related to diastolic dysfunction with normal LV function. She had moderate concentric LVH and grade 1 diastolic dysfunction by echo January 25, 2012. Her other problems include hypertension, non-insulin-dependent diabetes, hyperlipidemia, and obstructive sleep apnea. She had a negative Myoview May 02, 2008. She was admitted October 8-13 of this year with shortness of breath. The CT angiogram showed multiple small pulmonary emboli and she was coumadinized. Her lower extremity venous Dopplers were negative. Since I saw her a year ago she's been asymptomatic and denies chest pain or shortness of breath.     Current Outpatient Prescriptions  Medication Sig Dispense Refill  . albuterol (PROVENTIL HFA;VENTOLIN HFA) 108 (90 BASE) MCG/ACT inhaler Inhale 2 puffs into the lungs every 4 (four) hours as needed for wheezing.  2 Inhaler  3  . albuterol (PROVENTIL) (5 MG/ML) 0.5% nebulizer solution Take 0.5 mLs (2.5 mg total) by nebulization 4 (four) times daily.  20 mL  3  . atenolol (TENORMIN) 100 MG tablet Take 100 mg by mouth at bedtime.      . budesonide (PULMICORT) 0.25 MG/2ML nebulizer solution Take 2 mLs (0.25 mg total) by nebulization 2 (two) times daily.  60 mL  3  . cetirizine (ZYRTEC) 10 MG tablet Take 1 tablet by mouth daily.      . clobetasol ointment (TEMOVATE) [id number] % Apply 1 application topically daily.       Marland Kitchen desonide (DESOWEN) 0.05 % cream Apply 1 application topically daily.       Marland Kitchen dexlansoprazole (DEXILANT) 60 MG capsule Take 60 mg by mouth 2 (two) times daily.      . diclofenac sodium (VOLTAREN) 1 % GEL  Apply 1 application topically daily.       Marland Kitchen diltiazem (TIAZAC) 300 MG 24 hr capsule Take 300 mg by mouth every morning.      . diphenhydrAMINE (BENADRYL) 25 MG tablet Take 25 mg by mouth every 6 (six) hours as needed for itching.      . enalapril (VASOTEC) 20 MG tablet Take 20 mg by mouth every morning.      . fluocinonide cream (LIDEX) [id number] % Apply 1 application topically 2 (two) times daily.       . furosemide (LASIX) 40 MG tablet Take 40 mg by mouth every morning.      . hydrALAZINE (APRESOLINE) 25 MG tablet Take 1 tablet (25 mg total) by mouth 3 (three) times daily.  90 tablet  3  . HYDROcodone-homatropine (HYCODAN) 5-1.5 MG/5ML syrup Take 5 mLs by mouth every 8 (eight) hours as needed for cough.  120 mL  0  . insulin aspart (NOVOLOG) 100 UNIT/ML injection Inject 15 Units into the skin 3 (three) times daily before meals.       . insulin glargine (LANTUS) 100 UNIT/ML injection Inject 70 Units into the skin at bedtime.      . isosorbide mononitrate (IMDUR) 30 MG 24 hr tablet Take 30 mg by mouth every morning.      Marland Kitchen ketotifen (ALAWAY) 0.025 % ophthalmic solution Place 1 drop into both eyes 2 (two) times daily.      Marland Kitchen lisinopril-hydrochlorothiazide (PRINZIDE,ZESTORETIC) 20-12.5  MG per tablet Take 1 tablet by mouth daily.      . polyethylene glycol (MIRALAX / GLYCOLAX) packet Take 17 g by mouth daily.  30 each  1  . pravastatin (PRAVACHOL) 40 MG tablet Take 40 mg by mouth every morning.      . warfarin (COUMADIN) 2.5 MG tablet Take 1.25 mg by mouth daily. Pt takes 1/2 tab daily       No current facility-administered medications for this visit.    Allergies  Allergen Reactions  . Penicillins Other (See Comments)    Swells up  . Peach [Prunus Persica] Hives  . Shellfish Allergy Hives    History   Social History  . Marital Status: Single    Spouse Name: N/A    Number of Children: N/A  . Years of Education: N/A   Occupational History  . Not on file.   Social History Main Topics    . Smoking status: Never Smoker   . Smokeless tobacco: Never Used  . Alcohol Use: No  . Drug Use: No  . Sexual Activity:    Other Topics Concern  . Not on file   Social History Narrative  . No narrative on file     Review of Systems: General: negative for chills, fever, night sweats or weight changes.  Cardiovascular: negative for chest pain, dyspnea on exertion, edema, orthopnea, palpitations, paroxysmal nocturnal dyspnea or shortness of breath Dermatological: negative for rash Respiratory: negative for cough or wheezing Urologic: negative for hematuria Abdominal: negative for nausea, vomiting, diarrhea, bright red blood per rectum, melena, or hematemesis Neurologic: negative for visual changes, syncope, or dizziness All other systems reviewed and are otherwise negative except as noted above.    Blood pressure 138/70, pulse 76, height 5' 6.5" (1.689 m), weight 264 lb 11.2 oz (120.067 kg).  General appearance: alert and no distress Neck: no adenopathy, no carotid bruit, no JVD, supple, symmetrical, trachea midline and thyroid not enlarged, symmetric, no tenderness/mass/nodules Lungs: clear to auscultation bilaterally Heart: regular rate and rhythm, S1, S2 normal, no murmur, click, rub or gallop Extremities: extremities normal, atraumatic, no cyanosis or edema  EKG sinus rhythm at 76 with right bundle branch block unchanged from the prior EKG  ASSESSMENT AND PLAN:   Hypertension Well-controlled on current medications  Hyperlipidemia On statin therapy followed by her PCP  Pulmonary embolism On Coumadin anticoagulation followed by her PCP      Lorretta Harp MD [facility], [facility] 04/05/2013 12:11 PM

## 2013-04-05 NOTE — Assessment & Plan Note (Signed)
On Coumadin anticoagulation followed by her PCP

## 2013-05-20 ENCOUNTER — Ambulatory Visit
Admission: RE | Admit: 2013-05-20 | Discharge: 2013-05-20 | Disposition: A | Discharge status: Home / Self Care | Payer: Medicaid Other | Source: Ambulatory Visit

## 2013-05-20 DIAGNOSIS — Z1231 Encounter for screening mammogram for malignant neoplasm of breast: Secondary | ICD-10-CM

## 2013-05-20 DIAGNOSIS — Z853 Personal history of malignant neoplasm of breast: Secondary | ICD-10-CM

## 2013-06-02 ENCOUNTER — Emergency Department (HOSPITAL_COMMUNITY)
Admission: EM | Admit: 2013-06-02 | Discharge: 2013-06-02 | Disposition: A | Discharge status: Home / Self Care | Payer: Medicare Other | Attending: Emergency Medicine | Admitting: Emergency Medicine

## 2013-06-02 ENCOUNTER — Emergency Department (HOSPITAL_COMMUNITY): Payer: Medicare Other

## 2013-06-02 ENCOUNTER — Encounter (HOSPITAL_COMMUNITY): Payer: Self-pay | Admitting: Emergency Medicine

## 2013-06-02 DIAGNOSIS — M549 Dorsalgia, unspecified: Secondary | ICD-10-CM

## 2013-06-02 DIAGNOSIS — Z794 Long term (current) use of insulin: Secondary | ICD-10-CM | POA: Insufficient documentation

## 2013-06-02 DIAGNOSIS — E119 Type 2 diabetes mellitus without complications: Secondary | ICD-10-CM | POA: Insufficient documentation

## 2013-06-02 DIAGNOSIS — Z86711 Personal history of pulmonary embolism: Secondary | ICD-10-CM | POA: Insufficient documentation

## 2013-06-02 DIAGNOSIS — Z79899 Other long term (current) drug therapy: Secondary | ICD-10-CM | POA: Insufficient documentation

## 2013-06-02 DIAGNOSIS — Z853 Personal history of malignant neoplasm of breast: Secondary | ICD-10-CM | POA: Insufficient documentation

## 2013-06-02 DIAGNOSIS — E785 Hyperlipidemia, unspecified: Secondary | ICD-10-CM | POA: Insufficient documentation

## 2013-06-02 DIAGNOSIS — J4489 Other specified chronic obstructive pulmonary disease: Secondary | ICD-10-CM | POA: Insufficient documentation

## 2013-06-02 DIAGNOSIS — Z880 Allergy status to penicillin: Secondary | ICD-10-CM | POA: Insufficient documentation

## 2013-06-02 DIAGNOSIS — I10 Essential (primary) hypertension: Secondary | ICD-10-CM | POA: Insufficient documentation

## 2013-06-02 DIAGNOSIS — J449 Chronic obstructive pulmonary disease, unspecified: Secondary | ICD-10-CM | POA: Insufficient documentation

## 2013-06-02 DIAGNOSIS — M546 Pain in thoracic spine: Secondary | ICD-10-CM | POA: Insufficient documentation

## 2013-06-02 DIAGNOSIS — R739 Hyperglycemia, unspecified: Secondary | ICD-10-CM

## 2013-06-02 DIAGNOSIS — G4730 Sleep apnea, unspecified: Secondary | ICD-10-CM | POA: Insufficient documentation

## 2013-06-02 DIAGNOSIS — I509 Heart failure, unspecified: Secondary | ICD-10-CM | POA: Insufficient documentation

## 2013-06-02 LAB — CBC WITH DIFFERENTIAL/PLATELET
BASOS ABS: 0 10*3/uL (ref 0.0–0.1)
BASOS PCT: 0 % (ref 0–1)
Eosinophils Absolute: 0.2 10*3/uL (ref 0.0–0.7)
Eosinophils Relative: 2 % (ref 0–5)
HCT: 35.3 % — ABNORMAL LOW (ref 36.0–46.0)
HEMOGLOBIN: 11.9 g/dL — AB (ref 12.0–15.0)
Lymphocytes Relative: 20 % (ref 12–46)
Lymphs Abs: 1.6 10*3/uL (ref 0.7–4.0)
MCH: 29 pg (ref 26.0–34.0)
MCHC: 33.7 g/dL (ref 30.0–36.0)
MCV: 85.9 fL (ref 78.0–100.0)
Monocytes Absolute: 0.3 10*3/uL (ref 0.1–1.0)
Monocytes Relative: 3 % (ref 3–12)
NEUTROS ABS: 6.1 10*3/uL (ref 1.7–7.7)
NEUTROS PCT: 75 % (ref 43–77)
Platelets: 341 10*3/uL (ref 150–400)
RBC: 4.11 MIL/uL (ref 3.87–5.11)
RDW: 13 % (ref 11.5–15.5)
WBC: 8.2 10*3/uL (ref 4.0–10.5)

## 2013-06-02 LAB — BASIC METABOLIC PANEL
BUN: 34 mg/dL — ABNORMAL HIGH (ref 6–23)
CHLORIDE: 92 meq/L — AB (ref 96–112)
CO2: 27 mEq/L (ref 19–32)
CREATININE: 1.61 mg/dL — AB (ref 0.50–1.10)
Calcium: 10.1 mg/dL (ref 8.4–10.5)
GFR calc non Af Amer: 31 mL/min — ABNORMAL LOW (ref >90)
GFR, EST AFRICAN AMERICAN: 36 mL/min — AB (ref >90)
Glucose, Bld: 454 mg/dL — ABNORMAL HIGH (ref 70–99)
POTASSIUM: 4.4 meq/L (ref 3.7–5.3)
Sodium: 134 mEq/L — ABNORMAL LOW (ref 137–147)

## 2013-06-02 LAB — GLUCOSE, CAPILLARY: Glucose-Capillary: 420 mg/dL — ABNORMAL HIGH (ref 70–99)

## 2013-06-02 MED ORDER — KETOROLAC TROMETHAMINE 15 MG/ML IJ SOLN
30.0000 mg | Freq: Once | INTRAMUSCULAR | Status: AC
  Administered 2013-06-02: 30 mg via INTRAVENOUS
  Filled 2013-06-02: qty 2

## 2013-06-02 MED ORDER — PROMETHAZINE HCL 25 MG PO TABS: 25.0000 mg | ORAL_TABLET | Freq: Four times a day (QID) | ORAL | Status: DC | PRN

## 2013-06-02 MED ORDER — SODIUM CHLORIDE 0.9 % IV BOLUS (SEPSIS): 500.0000 mL | Freq: Once | INTRAVENOUS | Status: DC

## 2013-06-02 MED ORDER — OXYCODONE-ACETAMINOPHEN 5-325 MG PO TABS: 1.0000 | ORAL_TABLET | ORAL | Status: DC | PRN

## 2013-06-02 MED ORDER — SODIUM CHLORIDE 0.9 % IV BOLUS (SEPSIS)
500.0000 mL | Freq: Once | INTRAVENOUS | Status: AC
  Administered 2013-06-02: 500 mL via INTRAVENOUS

## 2013-06-02 MED ORDER — CYCLOBENZAPRINE HCL 10 MG PO TABS: 10.0000 mg | ORAL_TABLET | Freq: Two times a day (BID) | ORAL | Status: DC | PRN

## 2013-06-02 MED ORDER — ONDANSETRON HCL 4 MG/2ML IJ SOLN
4.0000 mg | Freq: Once | INTRAMUSCULAR | Status: DC
  Filled 2013-06-02: qty 2

## 2013-06-02 MED ORDER — ONDANSETRON HCL 4 MG/2ML IJ SOLN
4.0000 mg | Freq: Once | INTRAMUSCULAR | Status: AC
  Administered 2013-06-02: 4 mg via INTRAVENOUS

## 2013-06-02 NOTE — ED Notes (Signed)
Bed: WA21 Expected date: 06/02/13 Expected time: 7:36 AM Means of arrival: Ambulance Comments: HYPERGLYCEMIA, BACK PAIN

## 2013-06-02 NOTE — Discharge Instructions (Signed)
Glucose was elevated today.   Chest x-ray showed no pneumonia.  Medication for pain and nausea.  Followup your primary care Dr.

## 2013-06-02 NOTE — ED Notes (Signed)
Per EMS, pt from home.  Pt c/o back pain which usually occurs about 1 per month.  Pt states unable to get pain under control.   Pt has lupus hx.  Pt did not indicate taking pain meds this am.  Pt is also hyperglycemic in route at 557.  Vitals:  144/68, hr 80, resp 20 , 99% ra.  Hx DM, Lupus, CHF, asthma

## 2013-06-02 NOTE — ED Provider Notes (Addendum)
CSN: [id number]     Arrival date & time 06/02/13  0741 History   First MD Initiated Contact with Patient 06/02/13 0802     Chief Complaint  Patient presents with  . Back Pain     (Consider location/radiation/quality/duration/timing/severity/associated sxs/prior Treatment) HPI....[name] [name] complains of sharp upper back pain for the past 24 hours. This is happened before with negative workup. Patient allegedly has history of lupus. Has not taken any pain medication for this today.  No cough, anterior chest pain, dyspnea, fever, chills, dysuria. Severity is mild to moderate. No radiation of pain  Past Medical History  Diagnosis Date  . COPD (chronic obstructive pulmonary disease)   . Diabetes mellitus without complication   . CHF (congestive heart failure)   . Lupus   . Cancer     breast cancer  . Pulmonary embolism 01/2012     CT showed multi small PE and coumadized   . Sleep apnea 2010    C-PAP machine  . Systemic lupus erythematosus   . Hypertension   . Hyperlipidemia    Past Surgical History  Procedure Laterality Date  . Mastectomy      right side  . Abdominal hysterectomy  1976  . Exploratory laparotomy    . Back surgery    . Doppler echocardiography  01/25/2012    EF 55 TO 60%; LV norm.  Pryor Curia myoview  05/02/2008    EF 67% ; LV norm  . Lower extrem. venous doppler  01/25/2012     neg.   History reviewed. No pertinent family history. History  Substance Use Topics  . Smoking status: Never Smoker   . Smokeless tobacco: Never Used  . Alcohol Use: No   OB History   Grav Para Term Preterm Abortions TAB SAB Ect Mult Living                 Review of Systems  All other systems reviewed and are negative.      Allergies  Penicillins; Peach; and Shellfish allergy  Home Medications   Current Outpatient Rx  Name  Route  Sig  Dispense  Refill  . acetaminophen (TYLENOL) 650 MG CR tablet   Oral   Take 650 mg by mouth every 8 (eight) hours as needed for pain.          [name] [name] albuterol (PROVENTIL HFA;VENTOLIN HFA) 108 (90 BASE) MCG/ACT inhaler   Inhalation   Inhale 2 puffs into the lungs every 6 (six) hours as needed for wheezing or shortness of breath.         [name] [name] albuterol (PROVENTIL) (2.5 MG/3ML) 0.083% nebulizer solution   Nebulization   Take 2.5 mg by nebulization every 6 (six) hours as needed for wheezing or shortness of breath.         [name] [name] atenolol (TENORMIN) 100 MG tablet   Oral   Take 100 mg by mouth daily.          . budesonide (PULMICORT) 0.5 MG/2ML nebulizer solution   Nebulization   Take 0.5 mg by nebulization every 6 (six) hours as needed (wheezing).         . cholecalciferol (VITAMIN D) 1000 UNITS tablet   Oral   Take 1,000 Units by mouth daily.         . clobetasol ointment (TEMOVATE) 0.05 %   Topical   Apply 1 application topically daily.          [name] [name] desonide (DESOWEN) 0.05 % cream   Topical   Apply  1 application topically daily.          [name] [name] dexlansoprazole (DEXILANT) 60 MG capsule   Oral   Take 60 mg by mouth 2 (two) times daily.         . diclofenac sodium (VOLTAREN) 1 % GEL   Topical   Apply 1 application topically daily.          [name] [name] diltiazem (TIAZAC) 300 MG 24 hr capsule   Oral   Take 300 mg by mouth every morning.         . diphenhydrAMINE (BENADRYL) 25 MG tablet   Oral   Take 25 mg by mouth every 6 (six) hours as needed for itching or allergies.          . furosemide (LASIX) 40 MG tablet   Oral   Take 80 mg by mouth every morning.          . hydrALAZINE (APRESOLINE) 25 MG tablet   Oral   Take 25 mg by mouth 3 (three) times daily.         . insulin aspart (NOVOLOG) 100 UNIT/ML injection   Subcutaneous   Inject 10 Units into the skin 3 (three) times daily before meals.          . insulin glargine (LANTUS) 100 UNIT/ML injection   Subcutaneous   Inject 40 Units into the skin 2 (two) times daily.          . isosorbide mononitrate (IMDUR) 30 MG 24 hr tablet   Oral   Take 30  mg by mouth every morning.         [name] [name] ketotifen (ALAWAY) 0.025 % ophthalmic solution   Both Eyes   Place 1 drop into both eyes 2 (two) times daily.         [name] [name] lisinopril-hydrochlorothiazide (PRINZIDE,ZESTORETIC) 20-12.5 MG per tablet   Oral   Take 1 tablet by mouth daily.         . polyethylene glycol (MIRALAX / GLYCOLAX) packet   Oral   Take 17 g by mouth daily.         . pravastatin (PRAVACHOL) 40 MG tablet   Oral   Take 40 mg by mouth at bedtime.          . SYMBICORT 160-4.5 MCG/ACT inhaler   Inhalation   Inhale 2 puffs into the lungs 2 (two) times daily.          [name] [name] tiZANidine (ZANAFLEX) 2 MG tablet   Oral   Take 2 mg by mouth 2 (two) times daily.          . vitamin B-12 (CYANOCOBALAMIN) 1000 MCG tablet   Oral   Take 1,000 mcg by mouth daily.         . cyclobenzaprine (FLEXERIL) 10 MG tablet   Oral   Take 1 tablet (10 mg total) by mouth 2 (two) times daily as needed for muscle spasms.   20 tablet   0   . promethazine (PHENERGAN) 25 MG tablet   Oral   Take 1 tablet (25 mg total) by mouth every 6 (six) hours as needed for nausea.   20 tablet   0    BP 152/69  Pulse 78  Temp(Src) 97.7 F (36.5 C) (Oral)  Resp 18  SpO2 95% Physical Exam  Nursing note and vitals reviewed. Constitutional: She is oriented to person, place, and time. She appears well-developed and well-nourished.  HENT:  Head: Normocephalic and atraumatic.  Eyes: Conjunctivae and EOM are  normal. Pupils are equal, round, and reactive to light.  Neck: Normal range of motion. Neck supple.  Cardiovascular: Normal rate, regular rhythm and normal heart sounds.   Pulmonary/Chest: Effort normal and breath sounds normal.  Abdominal: Soft. Bowel sounds are normal.  Musculoskeletal:  Minimal paraspinous tenderness at approximately T6-8  Neurological: She is alert and oriented to person, place, and time.  Skin: Skin is warm and dry.  Psychiatric: She has a normal mood and affect. Her  behavior is normal.    ED Course  Procedures (including critical care time) Labs Review Labs Reviewed  BASIC METABOLIC PANEL - Abnormal; Notable for the following:    Sodium 134 (*)    Chloride 92 (*)    Glucose, Bld 454 (*)    BUN 34 (*)    Creatinine, Ser 1.61 (*)    GFR calc non Af Amer 31 (*)    GFR calc Af Amer 36 (*)    All other components within normal limits  CBC WITH DIFFERENTIAL - Abnormal; Notable for the following:    Hemoglobin 11.9 (*)    HCT 35.3 (*)    All other components within normal limits  GLUCOSE, CAPILLARY - Abnormal; Notable for the following:    Glucose-Capillary 420 (*)    All other components within normal limits   Imaging Review No results found.  EKG Interpretation   None       MDM   Final diagnoses:  Back pain  Hyperglycemia    Niece reports the patient is approximately at her baseline. Glucose noted to be elevated, but not in DKA. Chest x-ray negative. Discharge medications Flexeril 10 mg and Phenergan 25 mg. Patient has primary care followup.    Nat Christen, MD 06/02/13 [place], MD 06/04/13 [phone]

## 2013-09-16 ENCOUNTER — Other Ambulatory Visit: Payer: Self-pay | Admitting: Podiatry

## 2013-09-16 DIAGNOSIS — I739 Peripheral vascular disease, unspecified: Secondary | ICD-10-CM

## 2013-09-24 ENCOUNTER — Ambulatory Visit
Admission: RE | Admit: 2013-09-24 | Discharge: 2013-09-24 | Disposition: A | Discharge status: Home / Self Care | Payer: Medicaid Other | Source: Ambulatory Visit | Attending: Podiatry | Admitting: Podiatry

## 2013-09-24 ENCOUNTER — Other Ambulatory Visit: Payer: Self-pay | Admitting: Radiology

## 2013-09-24 DIAGNOSIS — I739 Peripheral vascular disease, unspecified: Secondary | ICD-10-CM

## 2013-09-24 LAB — CREATININE WITH EST GFR
Creat: 1.52 mg/dL — ABNORMAL HIGH (ref 0.50–1.10)
GFR, EST NON AFRICAN AMERICAN: 34 mL/min — AB
GFR, Est African American: 39 mL/min — ABNORMAL LOW

## 2013-09-24 LAB — BUN: BUN: 32 mg/dL — ABNORMAL HIGH (ref 6–23)

## 2013-09-26 ENCOUNTER — Other Ambulatory Visit (HOSPITAL_COMMUNITY): Payer: Self-pay | Admitting: Interventional Radiology

## 2013-09-26 DIAGNOSIS — I739 Peripheral vascular disease, unspecified: Secondary | ICD-10-CM

## 2013-10-09 ENCOUNTER — Ambulatory Visit
Admission: RE | Admit: 2013-10-09 | Discharge: 2013-10-09 | Disposition: A | Discharge status: Home / Self Care | Payer: Medicaid Other | Source: Ambulatory Visit | Attending: Interventional Radiology | Admitting: Interventional Radiology

## 2013-10-09 DIAGNOSIS — I739 Peripheral vascular disease, unspecified: Secondary | ICD-10-CM

## 2013-10-15 ENCOUNTER — Telehealth: Payer: Self-pay | Admitting: Emergency Medicine

## 2013-10-15 ENCOUNTER — Other Ambulatory Visit (HOSPITAL_COMMUNITY): Payer: Self-pay | Admitting: Interventional Radiology

## 2013-10-15 DIAGNOSIS — I739 Peripheral vascular disease, unspecified: Secondary | ICD-10-CM

## 2013-10-15 NOTE — Telephone Encounter (Signed)
Message copied by Janith Lima on Tue Oct 15, 2013  9:41 AM ------      Message from: Aletta Edouard T      Created: Mon Oct 14, 2013  8:47 AM      Regarding: RE: ART [place] RESULTS       Follow up visit only in next couple weeks.  No procedures.            GY                  ----- Message -----         From: Janith Lima, EMT         Sent: 10/09/2013   9:15 AM           To: Azzie Roup, MD      Subject: FW: ART [place] RESULTS                                       PLEASE REVIEW AND ADVISE.            TAMS      ----- Message -----         From: Janith Lima, EMT         Sent: 09/26/2013  12:25 PM           To: Janith Lima, EMT, Henri Jamse Mead, RN      Subject: ART [place] RESULTS                                           PT HAVING ART. [place]  10-09-13 AT 930AM//HAVER DR Maryan Puls TO REVIEW AND ADVISE ON ANYTHING!!!             ------

## 2013-10-15 NOTE — Telephone Encounter (Signed)
LMOVM FOR PT TO CALL BACK TO SET UP F/U APPT W/ DR Maryan Puls

## 2013-10-29 ENCOUNTER — Ambulatory Visit
Admission: RE | Admit: 2013-10-29 | Discharge: 2013-10-29 | Disposition: A | Discharge status: Home / Self Care | Payer: Commercial Managed Care - HMO | Source: Ambulatory Visit | Attending: Interventional Radiology | Admitting: Interventional Radiology

## 2013-10-29 DIAGNOSIS — I739 Peripheral vascular disease, unspecified: Secondary | ICD-10-CM

## 2013-12-28 ENCOUNTER — Encounter (HOSPITAL_COMMUNITY): Payer: Self-pay | Admitting: Emergency Medicine

## 2013-12-28 ENCOUNTER — Observation Stay (HOSPITAL_COMMUNITY)
Admission: EM | Admit: 2013-12-28 | Discharge: 2013-12-30 | Disposition: A | Discharge status: Home / Self Care | Payer: Medicare HMO | Attending: Family Medicine | Admitting: Family Medicine

## 2013-12-28 ENCOUNTER — Emergency Department (HOSPITAL_COMMUNITY): Payer: Medicare HMO

## 2013-12-28 DIAGNOSIS — Z79899 Other long term (current) drug therapy: Secondary | ICD-10-CM | POA: Insufficient documentation

## 2013-12-28 DIAGNOSIS — Z880 Allergy status to penicillin: Secondary | ICD-10-CM | POA: Diagnosis not present

## 2013-12-28 DIAGNOSIS — I5032 Chronic diastolic (congestive) heart failure: Secondary | ICD-10-CM | POA: Insufficient documentation

## 2013-12-28 DIAGNOSIS — G4730 Sleep apnea, unspecified: Secondary | ICD-10-CM | POA: Diagnosis not present

## 2013-12-28 DIAGNOSIS — Z853 Personal history of malignant neoplasm of breast: Secondary | ICD-10-CM | POA: Diagnosis not present

## 2013-12-28 DIAGNOSIS — Z91018 Allergy to other foods: Secondary | ICD-10-CM | POA: Diagnosis not present

## 2013-12-28 DIAGNOSIS — M129 Arthropathy, unspecified: Secondary | ICD-10-CM | POA: Insufficient documentation

## 2013-12-28 DIAGNOSIS — Z794 Long term (current) use of insulin: Secondary | ICD-10-CM | POA: Diagnosis not present

## 2013-12-28 DIAGNOSIS — E875 Hyperkalemia: Secondary | ICD-10-CM | POA: Insufficient documentation

## 2013-12-28 DIAGNOSIS — Z91013 Allergy to seafood: Secondary | ICD-10-CM | POA: Insufficient documentation

## 2013-12-28 DIAGNOSIS — I509 Heart failure, unspecified: Secondary | ICD-10-CM | POA: Diagnosis not present

## 2013-12-28 DIAGNOSIS — I10 Essential (primary) hypertension: Secondary | ICD-10-CM | POA: Diagnosis not present

## 2013-12-28 DIAGNOSIS — E119 Type 2 diabetes mellitus without complications: Secondary | ICD-10-CM | POA: Diagnosis not present

## 2013-12-28 DIAGNOSIS — M329 Systemic lupus erythematosus, unspecified: Secondary | ICD-10-CM | POA: Insufficient documentation

## 2013-12-28 DIAGNOSIS — Z90710 Acquired absence of both cervix and uterus: Secondary | ICD-10-CM | POA: Diagnosis not present

## 2013-12-28 DIAGNOSIS — Z86711 Personal history of pulmonary embolism: Secondary | ICD-10-CM | POA: Insufficient documentation

## 2013-12-28 DIAGNOSIS — E785 Hyperlipidemia, unspecified: Secondary | ICD-10-CM | POA: Insufficient documentation

## 2013-12-28 DIAGNOSIS — R0789 Other chest pain: Secondary | ICD-10-CM

## 2013-12-28 DIAGNOSIS — R079 Chest pain, unspecified: Secondary | ICD-10-CM | POA: Diagnosis not present

## 2013-12-28 DIAGNOSIS — J441 Chronic obstructive pulmonary disease with (acute) exacerbation: Secondary | ICD-10-CM | POA: Insufficient documentation

## 2013-12-28 DIAGNOSIS — Z9010 Acquired absence of unspecified breast and nipple: Secondary | ICD-10-CM | POA: Insufficient documentation

## 2013-12-28 LAB — CBC WITH DIFFERENTIAL/PLATELET
BASOS ABS: 0 10*3/uL (ref 0.0–0.1)
Basophils Relative: 0 % (ref 0–1)
Eosinophils Absolute: 0.3 10*3/uL (ref 0.0–0.7)
Eosinophils Relative: 4 % (ref 0–5)
HCT: 34.8 % — ABNORMAL LOW (ref 36.0–46.0)
HEMOGLOBIN: 11.3 g/dL — AB (ref 12.0–15.0)
LYMPHS PCT: 25 % (ref 12–46)
Lymphs Abs: 2.1 10*3/uL (ref 0.7–4.0)
MCH: 28.4 pg (ref 26.0–34.0)
MCHC: 32.5 g/dL (ref 30.0–36.0)
MCV: 87.4 fL (ref 78.0–100.0)
MONO ABS: 0.4 10*3/uL (ref 0.1–1.0)
Monocytes Relative: 5 % (ref 3–12)
NEUTROS ABS: 5.6 10*3/uL (ref 1.7–7.7)
NEUTROS PCT: 66 % (ref 43–77)
Platelets: 289 10*3/uL (ref 150–400)
RBC: 3.98 MIL/uL (ref 3.87–5.11)
RDW: 13.4 % (ref 11.5–15.5)
WBC: 8.5 10*3/uL (ref 4.0–10.5)

## 2013-12-28 LAB — PRO B NATRIURETIC PEPTIDE: Pro B Natriuretic peptide (BNP): 83.6 pg/mL (ref 0–125)

## 2013-12-28 LAB — BASIC METABOLIC PANEL
ANION GAP: 13 (ref 5–15)
BUN: 31 mg/dL — ABNORMAL HIGH (ref 6–23)
CHLORIDE: 99 meq/L (ref 96–112)
CO2: 28 meq/L (ref 19–32)
Calcium: 9.7 mg/dL (ref 8.4–10.5)
Creatinine, Ser: 1.88 mg/dL — ABNORMAL HIGH (ref 0.50–1.10)
GFR calc non Af Amer: 26 mL/min — ABNORMAL LOW (ref >90)
GFR, EST AFRICAN AMERICAN: 30 mL/min — AB (ref >90)
Glucose, Bld: 117 mg/dL — ABNORMAL HIGH (ref 70–99)
POTASSIUM: 4.5 meq/L (ref 3.7–5.3)
SODIUM: 140 meq/L (ref 137–147)

## 2013-12-28 LAB — I-STAT TROPONIN, ED: Troponin i, poc: 0 ng/mL (ref 0.00–0.08)

## 2013-12-28 MED ORDER — ASPIRIN 81 MG PO CHEW
324.0000 mg | CHEWABLE_TABLET | Freq: Once | ORAL | Status: AC
  Administered 2013-12-28: 324 mg via ORAL
  Filled 2013-12-28: qty 4

## 2013-12-28 MED ORDER — DIPHENHYDRAMINE HCL 25 MG PO CAPS
25.0000 mg | ORAL_CAPSULE | Freq: Once | ORAL | Status: AC
  Administered 2013-12-28: 25 mg via ORAL
  Filled 2013-12-28: qty 1

## 2013-12-28 MED ORDER — FENTANYL CITRATE 0.05 MG/ML IJ SOLN
50.0000 ug | Freq: Once | INTRAMUSCULAR | Status: AC
  Administered 2013-12-28: 50 ug via INTRAVENOUS
  Filled 2013-12-28: qty 2

## 2013-12-28 NOTE — ED Notes (Signed)
MD at bedside. 

## 2013-12-28 NOTE — ED Notes (Signed)
Bed: [id number] Expected date:  Expected time:  Means of arrival:  Comments: EMS gen pain / lupus

## 2013-12-28 NOTE — ED Provider Notes (Signed)
CSN: [id number]     Arrival date & time 12/28/13  2024 History   First MD Initiated Contact with Patient 12/28/13 2027     Chief Complaint  Patient presents with  . Lupus  . Chest Pain     HPI Per EMS, pt c/o chest pain in shoulders, left side of back radiating to left arm and left upper leg. Pt with h/o Lupus. Pain onset yesterday. States she has been fatigue and nausea, Denies vomiting and diarrhea. She states she has an outbreak on her upper lip. Feels a little SOB, from pain. Came to hospital for similar incident in February. She has h/o CHF, per EMS 12 lead, RBBB, inverted T waves in lead III, SR on monitor. VS: BP:140/80 RR: 20 SPo2: 96 2Lo2 ( home oxygen) .       Past Medical History  Diagnosis Date  . COPD (chronic obstructive pulmonary disease)   . Diabetes mellitus without complication   . CHF (congestive heart failure)   . Lupus   . Cancer     breast cancer  . Pulmonary embolism 01/2012     CT showed multi small PE and coumadized   . Sleep apnea 2010    C-PAP machine  . Systemic lupus erythematosus   . Hypertension   . Hyperlipidemia    Past Surgical History  Procedure Laterality Date  . Mastectomy      right side  . Abdominal hysterectomy  1976  . Exploratory laparotomy    . Back surgery    . Doppler echocardiography  01/25/2012    EF 55 TO 60%; LV norm.  Pryor Curia myoview  05/02/2008    EF 67% ; LV norm  . Lower extrem. venous doppler  01/25/2012     neg.   No family history on file. History  Substance Use Topics  . Smoking status: Never Smoker   . Smokeless tobacco: Never Used  . Alcohol Use: No   OB History   Grav Para Term Preterm Abortions TAB SAB Ect Mult Living                 Review of Systems  All other systems reviewed and are negative  Allergies  Penicillins; Peach; and Shellfish allergy  Home Medications   Prior to Admission medications   Medication Sig Start Date End Date Taking? Authorizing Provider  acetaminophen  (TYLENOL) 650 MG CR tablet Take 650 mg by mouth every 8 (eight) hours as needed for pain.   Yes Historical Provider, MD  albuterol (PROVENTIL) (2.5 MG/3ML) 0.083% nebulizer solution Take 2.5 mg by nebulization every 6 (six) hours as needed for wheezing or shortness of breath.   Yes Historical Provider, MD  atenolol (TENORMIN) 100 MG tablet Take 100 mg by mouth daily.    Yes Historical Provider, MD  budesonide (PULMICORT) 0.5 MG/2ML nebulizer solution Take 0.5 mg by nebulization every 6 (six) hours as needed (wheezing).   Yes Historical Provider, MD  cholecalciferol (VITAMIN D) 1000 UNITS tablet Take 1,000 Units by mouth daily.   Yes Historical Provider, MD  dexlansoprazole (DEXILANT) 60 MG capsule Take 60 mg by mouth 2 (two) times daily.   Yes Historical Provider, MD  diclofenac sodium (VOLTAREN) 1 % GEL Apply 1 application topically daily.    Yes Historical Provider, MD  diltiazem (TIAZAC) 300 MG 24 hr capsule Take 300 mg by mouth every morning.   Yes Historical Provider, MD  furosemide (LASIX) 40 MG tablet Take 40 mg by mouth daily.  Yes Historical Provider, MD  hydrALAZINE (APRESOLINE) 25 MG tablet Take 25 mg by mouth 3 (three) times daily.   Yes Historical Provider, MD  insulin aspart (NOVOLOG) 100 UNIT/ML injection Inject 10 Units into the skin 3 (three) times daily before meals.    Yes Historical Provider, MD  insulin glargine (LANTUS) 100 UNIT/ML injection Inject 40-50 Units into the skin 2 (two) times daily. 50 units in the morning 40 units at bedtime 01/29/12  Yes Ripudeep K Rai, MD  isosorbide mononitrate (IMDUR) 30 MG 24 hr tablet Take 30 mg by mouth every morning.   Yes Historical Provider, MD  ketotifen (ALAWAY) 0.025 % ophthalmic solution Place 1 drop into both eyes 2 (two) times daily.   Yes Historical Provider, MD  lisinopril-hydrochlorothiazide (PRINZIDE,ZESTORETIC) 20-12.5 MG per tablet Take 1 tablet by mouth daily. 03/08/13  Yes Historical Provider, MD  polyethylene glycol  (MIRALAX / GLYCOLAX) packet Take 17 g by mouth daily.   Yes Historical Provider, MD  pravastatin (PRAVACHOL) 40 MG tablet Take 40 mg by mouth at bedtime.    Yes Historical Provider, MD  SYMBICORT 160-4.5 MCG/ACT inhaler Inhale 2 puffs into the lungs 2 (two) times daily.  04/22/13  Yes Historical Provider, MD  tiZANidine (ZANAFLEX) 2 MG tablet Take 2 mg by mouth 2 (two) times daily.  05/31/13  Yes Historical Provider, MD  vitamin B-12 (CYANOCOBALAMIN) 1000 MCG tablet Take 1,000 mcg by mouth daily.   Yes Historical Provider, MD  albuterol (PROVENTIL HFA;VENTOLIN HFA) 108 (90 BASE) MCG/ACT inhaler Inhale 2 puffs into the lungs every 6 (six) hours as needed for wheezing or shortness of breath.    Historical Provider, MD  clobetasol ointment (TEMOVATE) [id number] % Apply 1 application topically daily.     Historical Provider, MD  cyclobenzaprine (FLEXERIL) 10 MG tablet Take 1 tablet (10 mg total) by mouth 2 (two) times daily as needed for muscle spasms. 06/02/13   Nat Christen, MD  desonide (DESOWEN) 0.05 % cream Apply 1 application topically daily.     Historical Provider, MD  diphenhydrAMINE (BENADRYL) 25 MG tablet Take 25 mg by mouth every 6 (six) hours as needed for itching or allergies.     Historical Provider, MD  promethazine (PHENERGAN) 25 MG tablet Take 1 tablet (25 mg total) by mouth every 6 (six) hours as needed for nausea. 06/02/13   Nat Christen, MD   BP 123/55  Pulse 66  Temp(Src) 98.6 F (37 C) (Oral)  Resp 16  Ht 5\' 5"  (1.651 m)  Wt 239 lb (108.41 kg)  BMI 39.77 kg/m2  SpO2 96% Physical Exam  Nursing note and vitals reviewed. Constitutional: She is oriented to person, place, and time. She appears well-developed and well-nourished. No distress.  HENT:  Head: Normocephalic and atraumatic.  Eyes: Pupils are equal, round, and reactive to light.  Neck: Normal range of motion.  Cardiovascular: Normal rate and intact distal pulses.   Pulmonary/Chest: No respiratory distress. She has no wheezes. She  has no rales.  Abdominal: Normal appearance. She exhibits no distension. There is no tenderness. There is no rebound.  Musculoskeletal: Normal range of motion. She exhibits edema (2+ pitting).  Neurological: She is alert and oriented to person, place, and time. No cranial nerve deficit.  Skin: Skin is warm and dry. No rash noted.  Psychiatric: She has a normal mood and affect. Her behavior is normal.    ED Course  Procedures (including critical care time)   Medications  fentaNYL (SUBLIMAZE) injection 50 mcg (not administered)  Labs Review Labs Reviewed  BASIC METABOLIC PANEL - Abnormal; Notable for the following:    Glucose, Bld 117 (*)    BUN 31 (*)    Creatinine, Ser 1.88 (*)    GFR calc non Af Amer 26 (*)    GFR calc Af Amer 30 (*)    All other components within normal limits  CBC WITH DIFFERENTIAL - Abnormal; Notable for the following:    Hemoglobin 11.3 (*)    HCT 34.8 (*)    All other components within normal limits  PRO B NATRIURETIC PEPTIDE  I-STAT TROPOININ, ED    Imaging Review Dg Chest Portable 1 View  12/28/2013   CLINICAL DATA:  Chest pain.  History of lupus.  Fatigue and nausea.  EXAM: PORTABLE CHEST - 1 VIEW  COMPARISON:  06/02/2013.  FINDINGS: The heart is mildly enlarged but stable. The mediastinal and hilar contours are within normal limits given the rotation of the patient. Prominent interstitial markings likely due to known lupus. Streaky bibasilar atelectasis but no definite infiltrates or effusions.  IMPRESSION: Mild stable cardiac enlargement.  Chronic interstitial changes and bibasilar atelectasis.   Electronically Signed   By: Kalman Jewels M.D.   On: 12/28/2013 21:06     EKG Interpretation   Date/Time:  Saturday December 28 2013 20:37:44 EDT Ventricular Rate:  69 PR Interval:  146 QRS Duration: 134 QT Interval:  436 QTC Calculation: 467 R Axis:   -43 Text Interpretation:  Sinus rhythm RBBB and LAFB No significant change  since last  tracing Confirmed by Madelein Mahadeo  MD, Januel Doolan ([id number]) on 12/28/2013  8:43:03 PM      MDM   Final diagnoses:  Chest pain, unspecified chest pain type        Dot Lanes, MD 12/28/13 2237

## 2013-12-28 NOTE — H&P (Signed)
History and Physical  [name] [name] [id number] DOB: March 09, 1941 DOA: 12/28/2013  Referring physician: Dr Audie Pinto, ED physician PCP: Philis Fendt, MD   Chief Complaint: Chest Pain  HPI: [name] [name] is a 73 y.o. female  With a history of hypertension, diabetes, COPD, chronic diastolic heart failure, history of pulmonary embolism, lupus. She presents with a two day history of chest that started in her back and progressed to left substernal chest pain that was non-radiating.  Also having som left arm pain additionally.  Pain worse with movement, particularly twisting movements.  Denies shortness of breath, diaphoresis, nausea.  Does have chronic pain with arthritis and lupus and states that her back pain is similar to her lupus pain.  Additionally, she does have a history of MI and states that her chest pain feels similar.  Review of Systems:   Pt denies any fevers, chills, shortness of breath, palpitations, nausea, vomiting.  Review of systems are otherwise negative  Past Medical History  Diagnosis Date  . COPD (chronic obstructive pulmonary disease)   . Diabetes mellitus without complication   . CHF (congestive heart failure)   . Lupus   . Cancer     breast cancer  . Pulmonary embolism 01/2012     CT showed multi small PE and coumadized   . Sleep apnea 2010    C-PAP machine  . Systemic lupus erythematosus   . Hypertension   . Hyperlipidemia    Past Surgical History  Procedure Laterality Date  . Mastectomy      right side  . Abdominal hysterectomy  1976  . Exploratory laparotomy    . Back surgery    . Doppler echocardiography  01/25/2012    EF 55 TO 60%; LV norm.  Pryor Curia myoview  05/02/2008    EF 67% ; LV norm  . Lower extrem. venous doppler  01/25/2012     neg.   Social History:  reports that she has never smoked. She has never used smokeless tobacco. She reports that she does not drink alcohol or use illicit drugs. Patient lives at home & is able to  participate in activities of daily living  Allergies  Allergen Reactions  . Penicillins Other (See Comments)    Swells up  . Peach [Prunus Persica] Hives  . Shellfish Allergy Hives    No family history on file.    Prior to Admission medications   Medication Sig Start Date End Date Taking? Authorizing Provider  acetaminophen (TYLENOL) 650 MG CR tablet Take 650 mg by mouth every 8 (eight) hours as needed for pain.   Yes Historical Provider, MD  albuterol (PROVENTIL) (2.5 MG/3ML) 0.083% nebulizer solution Take 2.5 mg by nebulization every 6 (six) hours as needed for wheezing or shortness of breath.   Yes Historical Provider, MD  atenolol (TENORMIN) 100 MG tablet Take 100 mg by mouth daily.    Yes Historical Provider, MD  budesonide (PULMICORT) 0.5 MG/2ML nebulizer solution Take 0.5 mg by nebulization every 6 (six) hours as needed (wheezing).   Yes Historical Provider, MD  cholecalciferol (VITAMIN D) 1000 UNITS tablet Take 1,000 Units by mouth daily.   Yes Historical Provider, MD  dexlansoprazole (DEXILANT) 60 MG capsule Take 60 mg by mouth 2 (two) times daily.   Yes Historical Provider, MD  diclofenac sodium (VOLTAREN) 1 % GEL Apply 1 application topically daily.    Yes Historical Provider, MD  diltiazem (TIAZAC) 300 MG 24 hr capsule Take 300 mg by mouth every morning.  Yes Historical Provider, MD  furosemide (LASIX) 40 MG tablet Take 40 mg by mouth daily.    Yes Historical Provider, MD  hydrALAZINE (APRESOLINE) 25 MG tablet Take 25 mg by mouth 3 (three) times daily.   Yes Historical Provider, MD  insulin aspart (NOVOLOG) 100 UNIT/ML injection Inject 10 Units into the skin 3 (three) times daily before meals.    Yes Historical Provider, MD  insulin glargine (LANTUS) 100 UNIT/ML injection Inject 40-50 Units into the skin 2 (two) times daily. 50 units in the morning 40 units at bedtime 01/29/12  Yes Ripudeep K Rai, MD  isosorbide mononitrate (IMDUR) 30 MG 24 hr tablet Take 30 mg by mouth every  morning.   Yes Historical Provider, MD  ketotifen (ALAWAY) 0.025 % ophthalmic solution Place 1 drop into both eyes 2 (two) times daily.   Yes Historical Provider, MD  lisinopril-hydrochlorothiazide (PRINZIDE,ZESTORETIC) 20-12.5 MG per tablet Take 1 tablet by mouth daily. 03/08/13  Yes Historical Provider, MD  polyethylene glycol (MIRALAX / GLYCOLAX) packet Take 17 g by mouth daily.   Yes Historical Provider, MD  pravastatin (PRAVACHOL) 40 MG tablet Take 40 mg by mouth at bedtime.    Yes Historical Provider, MD  SYMBICORT 160-4.5 MCG/ACT inhaler Inhale 2 puffs into the lungs 2 (two) times daily.  04/22/13  Yes Historical Provider, MD  tiZANidine (ZANAFLEX) 2 MG tablet Take 2 mg by mouth 2 (two) times daily.  05/31/13  Yes Historical Provider, MD  vitamin B-12 (CYANOCOBALAMIN) 1000 MCG tablet Take 1,000 mcg by mouth daily.   Yes Historical Provider, MD  albuterol (PROVENTIL HFA;VENTOLIN HFA) 108 (90 BASE) MCG/ACT inhaler Inhale 2 puffs into the lungs every 6 (six) hours as needed for wheezing or shortness of breath.    Historical Provider, MD  clobetasol ointment (TEMOVATE) [id number] % Apply 1 application topically daily.     Historical Provider, MD  cyclobenzaprine (FLEXERIL) 10 MG tablet Take 1 tablet (10 mg total) by mouth 2 (two) times daily as needed for muscle spasms. 06/02/13   Nat Christen, MD  desonide (DESOWEN) 0.05 % cream Apply 1 application topically daily.     Historical Provider, MD  diphenhydrAMINE (BENADRYL) 25 MG tablet Take 25 mg by mouth every 6 (six) hours as needed for itching or allergies.     Historical Provider, MD  promethazine (PHENERGAN) 25 MG tablet Take 1 tablet (25 mg total) by mouth every 6 (six) hours as needed for nausea. 06/02/13   Nat Christen, MD    Physical Exam: BP 117/60  Pulse 67  Temp(Src) 98.6 F (37 C) (Oral)  Resp 15  Ht 5\' 5"  (1.651 m)  Wt 108.41 kg (239 lb)  BMI 39.77 kg/m2  SpO2 98%  General: Elderly black female. Awake and alert and oriented x3. No acute  cardiopulmonary distress.  Eyes: Pupils equal, round, reactive to light. Extraocular muscles are intact. Sclerae anicteric and noninjected.  ENT: External auditory canals are patent and tympanic membranes reflect a good cone of light. Moist mucosal membranes. No mucosal lesions. Neck: Neck supple without lymphadenopathy. No carotid bruits. No masses palpated.  Cardiovascular: Regular rate with normal S1-S2 sounds. No murmurs, rubs, gallops auscultated. No JVD.  Respiratory:  prolonged exhalation phase. No wheezes, rales, rhonchi. Lungs clear to auscultation bilaterally.  Abdomen: Soft, nontender, nondistended.  active  bowel sounds. No masses or hepatosplenomegaly  Skin: Dry, warm to touch. 2+ dorsalis pedis and radial pulses. Musculoskeletal: No calf or leg pain. All major joints not erythematous nontender.  Psychiatric: Intact  judgment and insight.  Neurologic: No focal neurological deficits. Cranial nerves II through XII are grossly intact.           Labs on Admission:  Basic Metabolic Panel:  Recent Labs Lab 12/28/13 2045  NA 140  K 4.5  CL 99  CO2 28  GLUCOSE 117*  BUN 31*  CREATININE 1.88*  CALCIUM 9.7   Liver Function Tests: No results found for this basename: AST, ALT, ALKPHOS, BILITOT, PROT, ALBUMIN,  in the last 168 hours No results found for this basename: LIPASE, AMYLASE,  in the last 168 hours No results found for this basename: AMMONIA,  in the last 168 hours CBC:  Recent Labs Lab 12/28/13 2045  WBC 8.5  NEUTROABS 5.6  HGB 11.3*  HCT 34.8*  MCV 87.4  PLT 289   Cardiac Enzymes: No results found for this basename: CKTOTAL, CKMB, CKMBINDEX, TROPONINI,  in the last 168 hours  BNP (last 3 results)  Recent Labs  12/28/13 2045  PROBNP 83.6   CBG: No results found for this basename: GLUCAP,  in the last 168 hours  Radiological Exams on Admission: Dg Chest Portable 1 View  12/28/2013   CLINICAL DATA:  Chest pain.  History of lupus.  Fatigue and  nausea.  EXAM: PORTABLE CHEST - 1 VIEW  COMPARISON:  06/02/2013.  FINDINGS: The heart is mildly enlarged but stable. The mediastinal and hilar contours are within normal limits given the rotation of the patient. Prominent interstitial markings likely due to known lupus. Streaky bibasilar atelectasis but no definite infiltrates or effusions.  IMPRESSION: Mild stable cardiac enlargement.  Chronic interstitial changes and bibasilar atelectasis.   Electronically Signed   By: Kalman Jewels M.D.   On: 12/28/2013 21:06    EKG: Independently reviewed. No ST elevation or depression.   Assessment/Plan Present on Admission:  . Chest pain  #1 chest pain sounds more typical of musculoskeletal issues. The ER physician contacted cardiology who recommended that the patient be admitted for rule out with serial troponins. We'll admit the patient for observation with telemetry and continue troponins every 6 hours. Continue with morphine as needed.  #2 diabetes Continue with home insulin  #3 hypertension Continue antihypertensives.  #4 COPD Continue with home inhalers.   DVT prophylaxis: Lovenox   Consultants: None   Code Status:  full   Family Communication:  none   Disposition Plan: Pending serial troponins   Time spent:  50 minutes   Loma Boston, DO [facility] Pager [phone]  **Disclaimer: This note may have been dictated with voice recognition software. Similar sounding words can inadvertently be transcribed and this note may contain transcription errors which may not have been corrected upon publication of note.**

## 2013-12-28 NOTE — ED Notes (Signed)
Per EMS, pt c/o chest pain in shoulders, left side of back radiating to left arm and left upper leg. Pt with h/o Lupus. Pain onset yesterday. States she has been fatigue and nausea, Denies vomiting and diarrhea. She states she has an outbreak on her upper lip. Feels a little SOB, from pain. Came to hospital for similar incident in February. She has h/o CHF, per EMS 12 lead, RBBB, inverted T waves in lead III, SR on monitor. VS: BP:140/80 RR: 20  SPo2: 96 2Lo2 ( home oxygen) .

## 2013-12-29 ENCOUNTER — Encounter (HOSPITAL_COMMUNITY): Payer: Self-pay | Admitting: *Deleted

## 2013-12-29 DIAGNOSIS — R079 Chest pain, unspecified: Secondary | ICD-10-CM | POA: Diagnosis not present

## 2013-12-29 DIAGNOSIS — I10 Essential (primary) hypertension: Secondary | ICD-10-CM

## 2013-12-29 DIAGNOSIS — M329 Systemic lupus erythematosus, unspecified: Secondary | ICD-10-CM

## 2013-12-29 DIAGNOSIS — E119 Type 2 diabetes mellitus without complications: Secondary | ICD-10-CM

## 2013-12-29 LAB — CBC
HEMATOCRIT: 34.3 % — AB (ref 36.0–46.0)
Hemoglobin: 11.1 g/dL — ABNORMAL LOW (ref 12.0–15.0)
MCH: 28.9 pg (ref 26.0–34.0)
MCHC: 32.4 g/dL (ref 30.0–36.0)
MCV: 89.3 fL (ref 78.0–100.0)
Platelets: 282 10*3/uL (ref 150–400)
RBC: 3.84 MIL/uL — ABNORMAL LOW (ref 3.87–5.11)
RDW: 13.6 % (ref 11.5–15.5)
WBC: 8.7 10*3/uL (ref 4.0–10.5)

## 2013-12-29 LAB — BASIC METABOLIC PANEL
ANION GAP: 13 (ref 5–15)
Anion gap: 11 (ref 5–15)
BUN: 36 mg/dL — AB (ref 6–23)
BUN: 42 mg/dL — AB (ref 6–23)
CALCIUM: 9.5 mg/dL (ref 8.4–10.5)
CO2: 29 meq/L (ref 19–32)
CO2: 26 mEq/L (ref 19–32)
CREATININE: 2.07 mg/dL — AB (ref 0.50–1.10)
Calcium: 9 mg/dL (ref 8.4–10.5)
Chloride: 102 mEq/L (ref 96–112)
Chloride: 92 mEq/L — ABNORMAL LOW (ref 96–112)
Creatinine, Ser: 1.87 mg/dL — ABNORMAL HIGH (ref 0.50–1.10)
GFR calc Af Amer: 26 mL/min — ABNORMAL LOW (ref >90)
GFR calc Af Amer: 30 mL/min — ABNORMAL LOW (ref >90)
GFR calc non Af Amer: 23 mL/min — ABNORMAL LOW (ref >90)
GFR calc non Af Amer: 26 mL/min — ABNORMAL LOW (ref >90)
GLUCOSE: 217 mg/dL — AB (ref 70–99)
Glucose, Bld: 503 mg/dL — ABNORMAL HIGH (ref 70–99)
Potassium: 4.7 mEq/L (ref 3.7–5.3)
Potassium: 5.6 mEq/L — ABNORMAL HIGH (ref 3.7–5.3)
Sodium: 142 mEq/L (ref 137–147)
Sodium: 131 mEq/L — ABNORMAL LOW (ref 137–147)

## 2013-12-29 LAB — TROPONIN I
Troponin I: <0.3 ng/mL (ref <0.30)
Troponin I: <0.3 ng/mL (ref <0.30)
Troponin I: <0.3 ng/mL (ref <0.30)

## 2013-12-29 LAB — GLUCOSE, CAPILLARY
Glucose-Capillary: 266 mg/dL — ABNORMAL HIGH (ref 70–99)
Glucose-Capillary: 168 mg/dL — ABNORMAL HIGH (ref 70–99)
Glucose-Capillary: 196 mg/dL — ABNORMAL HIGH (ref 70–99)
Glucose-Capillary: 460 mg/dL — ABNORMAL HIGH (ref 70–99)

## 2013-12-29 MED ORDER — LISINOPRIL-HYDROCHLOROTHIAZIDE 20-12.5 MG PO TABS: 1.0000 | ORAL_TABLET | Freq: Every day | ORAL | Status: DC

## 2013-12-29 MED ORDER — LISINOPRIL 20 MG PO TABS
20.0000 mg | ORAL_TABLET | Freq: Every day | ORAL | Status: DC
  Administered 2013-12-29: 20 mg via ORAL
  Filled 2013-12-29: qty 1

## 2013-12-29 MED ORDER — ATENOLOL 100 MG PO TABS
100.0000 mg | ORAL_TABLET | Freq: Every day | ORAL | Status: DC
  Administered 2013-12-29 – 2013-12-30 (×2): 100 mg via ORAL
  Filled 2013-12-29 (×2): qty 1

## 2013-12-29 MED ORDER — KETOTIFEN FUMARATE 0.025 % OP SOLN
1.0000 [drp] | Freq: Two times a day (BID) | OPHTHALMIC | Status: DC
  Administered 2013-12-29 – 2013-12-30 (×4): 1 [drp] via OPHTHALMIC
  Filled 2013-12-29: qty 5

## 2013-12-29 MED ORDER — IPRATROPIUM-ALBUTEROL 0.5-2.5 (3) MG/3ML IN SOLN
3.0000 mL | Freq: Three times a day (TID) | RESPIRATORY_TRACT | Status: DC
  Administered 2013-12-29: 3 mL via RESPIRATORY_TRACT
  Filled 2013-12-29: qty 3

## 2013-12-29 MED ORDER — IPRATROPIUM-ALBUTEROL 0.5-2.5 (3) MG/3ML IN SOLN
3.0000 mL | Freq: Four times a day (QID) | RESPIRATORY_TRACT | Status: DC
  Administered 2013-12-29 – 2013-12-30 (×5): 3 mL via RESPIRATORY_TRACT
  Filled 2013-12-29 (×5): qty 3

## 2013-12-29 MED ORDER — BUDESONIDE-FORMOTEROL FUMARATE 160-4.5 MCG/ACT IN AERO
2.0000 | INHALATION_SPRAY | Freq: Two times a day (BID) | RESPIRATORY_TRACT | Status: DC
  Administered 2013-12-29 – 2013-12-30 (×3): 2 via RESPIRATORY_TRACT
  Filled 2013-12-29: qty 6

## 2013-12-29 MED ORDER — ACETAMINOPHEN 325 MG PO TABS: 650.0000 mg | ORAL_TABLET | Freq: Four times a day (QID) | ORAL | Status: DC | PRN

## 2013-12-29 MED ORDER — ACETAMINOPHEN 650 MG RE SUPP: 650.0000 mg | Freq: Four times a day (QID) | RECTAL | Status: DC | PRN

## 2013-12-29 MED ORDER — FUROSEMIDE 40 MG PO TABS
40.0000 mg | ORAL_TABLET | Freq: Every day | ORAL | Status: DC
  Administered 2013-12-29: 40 mg via ORAL
  Filled 2013-12-29: qty 1

## 2013-12-29 MED ORDER — POLYETHYLENE GLYCOL 3350 17 G PO PACK
17.0000 g | PACK | Freq: Every day | ORAL | Status: DC
  Administered 2013-12-29 – 2013-12-30 (×2): 17 g via ORAL
  Filled 2013-12-29 (×2): qty 1

## 2013-12-29 MED ORDER — PROMETHAZINE HCL 25 MG PO TABS: 25.0000 mg | ORAL_TABLET | Freq: Four times a day (QID) | ORAL | Status: DC | PRN

## 2013-12-29 MED ORDER — ENOXAPARIN SODIUM 60 MG/0.6ML ~~LOC~~ SOLN
55.0000 mg | SUBCUTANEOUS | Status: DC
  Administered 2013-12-29 – 2013-12-30 (×2): 55 mg via SUBCUTANEOUS
  Filled 2013-12-29 (×2): qty 0.6

## 2013-12-29 MED ORDER — DILTIAZEM HCL ER BEADS 300 MG PO CP24
300.0000 mg | ORAL_CAPSULE | Freq: Every morning | ORAL | Status: DC
  Administered 2013-12-29 – 2013-12-30 (×2): 300 mg via ORAL
  Filled 2013-12-29 (×2): qty 1

## 2013-12-29 MED ORDER — MORPHINE SULFATE 2 MG/ML IJ SOLN
2.0000 mg | INTRAMUSCULAR | Status: DC | PRN
  Administered 2013-12-29 (×3): 2 mg via INTRAVENOUS
  Filled 2013-12-29 (×3): qty 1

## 2013-12-29 MED ORDER — ALUM & MAG HYDROXIDE-SIMETH 200-200-20 MG/5ML PO SUSP: 30.0000 mL | Freq: Four times a day (QID) | ORAL | Status: DC | PRN

## 2013-12-29 MED ORDER — HYDROCHLOROTHIAZIDE 12.5 MG PO CAPS
12.5000 mg | ORAL_CAPSULE | Freq: Every day | ORAL | Status: DC
  Administered 2013-12-29: 12.5 mg via ORAL
  Filled 2013-12-29: qty 1

## 2013-12-29 MED ORDER — INSULIN ASPART 100 UNIT/ML ~~LOC~~ SOLN
10.0000 [IU] | Freq: Three times a day (TID) | SUBCUTANEOUS | Status: DC
  Administered 2013-12-29: 10 [IU] via SUBCUTANEOUS

## 2013-12-29 MED ORDER — INSULIN ASPART 100 UNIT/ML ~~LOC~~ SOLN
0.0000 [IU] | Freq: Three times a day (TID) | SUBCUTANEOUS | Status: DC
  Administered 2013-12-29 (×2): 3 [IU] via SUBCUTANEOUS
  Administered 2013-12-30: 15 [IU] via SUBCUTANEOUS

## 2013-12-29 MED ORDER — INSULIN GLARGINE 100 UNIT/ML ~~LOC~~ SOLN
40.0000 [IU] | Freq: Every day | SUBCUTANEOUS | Status: DC
  Administered 2013-12-29 (×2): 40 [IU] via SUBCUTANEOUS
  Filled 2013-12-29 (×3): qty 0.4

## 2013-12-29 MED ORDER — ALBUTEROL SULFATE HFA 108 (90 BASE) MCG/ACT IN AERS: 2.0000 | INHALATION_SPRAY | Freq: Four times a day (QID) | RESPIRATORY_TRACT | Status: DC | PRN

## 2013-12-29 MED ORDER — INSULIN GLARGINE 100 UNIT/ML ~~LOC~~ SOLN: 40.0000 [IU] | Freq: Two times a day (BID) | SUBCUTANEOUS | Status: DC

## 2013-12-29 MED ORDER — INSULIN GLARGINE 100 UNIT/ML ~~LOC~~ SOLN
50.0000 [IU] | Freq: Every day | SUBCUTANEOUS | Status: DC
  Administered 2013-12-29 – 2013-12-30 (×2): 50 [IU] via SUBCUTANEOUS
  Filled 2013-12-29 (×2): qty 0.5

## 2013-12-29 MED ORDER — METHYLPREDNISOLONE SODIUM SUCC 125 MG IJ SOLR
60.0000 mg | Freq: Four times a day (QID) | INTRAMUSCULAR | Status: DC
  Administered 2013-12-29 – 2013-12-30 (×5): 60 mg via INTRAVENOUS
  Filled 2013-12-29 (×9): qty 0.96

## 2013-12-29 MED ORDER — CLOBETASOL PROPIONATE 0.05 % EX OINT
1.0000 "application " | TOPICAL_OINTMENT | Freq: Every day | CUTANEOUS | Status: DC
  Administered 2013-12-29 – 2013-12-30 (×2): 1 via TOPICAL
  Filled 2013-12-29: qty 15

## 2013-12-29 MED ORDER — ISOSORBIDE MONONITRATE ER 30 MG PO TB24
30.0000 mg | ORAL_TABLET | Freq: Every day | ORAL | Status: DC
  Administered 2013-12-29 – 2013-12-30 (×2): 30 mg via ORAL
  Filled 2013-12-29 (×2): qty 1

## 2013-12-29 MED ORDER — HYDRALAZINE HCL 25 MG PO TABS
25.0000 mg | ORAL_TABLET | Freq: Three times a day (TID) | ORAL | Status: DC
  Administered 2013-12-29 – 2013-12-30 (×5): 25 mg via ORAL
  Filled 2013-12-29 (×6): qty 1

## 2013-12-29 MED ORDER — DOCUSATE SODIUM 100 MG PO CAPS
100.0000 mg | ORAL_CAPSULE | Freq: Every day | ORAL | Status: DC | PRN
Start: 2013-12-29 — End: 2013-12-30
  Filled 2013-12-29: qty 1

## 2013-12-29 MED ORDER — TIZANIDINE HCL 2 MG PO TABS
2.0000 mg | ORAL_TABLET | Freq: Two times a day (BID) | ORAL | Status: DC
  Administered 2013-12-29 – 2013-12-30 (×4): 2 mg via ORAL
  Filled 2013-12-29 (×5): qty 1

## 2013-12-29 MED ORDER — SIMVASTATIN 20 MG PO TABS
20.0000 mg | ORAL_TABLET | Freq: Every day | ORAL | Status: DC
  Administered 2013-12-29: 20 mg via ORAL
  Filled 2013-12-29 (×2): qty 1

## 2013-12-29 MED ORDER — BUDESONIDE 0.5 MG/2ML IN SUSP: 0.5000 mg | Freq: Four times a day (QID) | RESPIRATORY_TRACT | Status: DC | PRN

## 2013-12-29 MED ORDER — SODIUM CHLORIDE 0.9 % IJ SOLN
3.0000 mL | Freq: Two times a day (BID) | INTRAMUSCULAR | Status: DC
  Administered 2013-12-29 – 2013-12-30 (×4): 3 mL via INTRAVENOUS

## 2013-12-29 MED ORDER — ALBUTEROL SULFATE (2.5 MG/3ML) 0.083% IN NEBU: 2.5000 mg | INHALATION_SOLUTION | Freq: Four times a day (QID) | RESPIRATORY_TRACT | Status: DC | PRN

## 2013-12-29 MED ORDER — PANTOPRAZOLE SODIUM 40 MG PO TBEC
40.0000 mg | DELAYED_RELEASE_TABLET | Freq: Every day | ORAL | Status: DC
  Administered 2013-12-29 – 2013-12-30 (×2): 40 mg via ORAL
  Filled 2013-12-29 (×2): qty 1

## 2013-12-29 MED ORDER — HYDROCODONE-ACETAMINOPHEN 5-325 MG PO TABS
1.0000 | ORAL_TABLET | Freq: Four times a day (QID) | ORAL | Status: DC | PRN
  Administered 2013-12-29 – 2013-12-30 (×2): 1 via ORAL
  Filled 2013-12-29 (×2): qty 1

## 2013-12-29 NOTE — Progress Notes (Signed)
[place] asked to adjust Lovenox dose as appropriate.  Total Body Weight = 108.4 kg BMI = 39.7 CrCl ~ 33 ml/min  Assessment:  Recommendation for BMI> 30 with CrCl > 30 ml/min is Lovenox 0.5 mg/kg/q24h  PLAN:  Change Lovenox to 55mg  sq q24h  Leone Haven, PharmD 12/29/13 @ 12:14

## 2013-12-29 NOTE — Progress Notes (Signed)
Pt has refused CPAP for the night.  RT to monitor and assess as needed.  

## 2013-12-29 NOTE — Progress Notes (Signed)
UR completed 

## 2013-12-29 NOTE — Progress Notes (Signed)
Pt stated that she didn't want to use CPAP tonight.  Pt states that she doesn't use it at home regularly.  RT to monitor and assess as needed.

## 2013-12-29 NOTE — Progress Notes (Signed)
[facility] PROGRESS NOTE  [name] [name] [id number] DOB: Sep 06, 1940 DOA: 12/28/2013 PCP: Philis Fendt, MD  Assessment/Plan:  1. Chest pain- resolved, cardiac enzymes x3 are negative, likely the chest pain is atypical in part of patient's overall flareup of lupus. We'll start the patient on Vicodin when necessary for pain. 2. COPD exacerbation- patient has bilateral wheezing, will start the patient on Solu-Medrol 60 mg IV every 6 hours, DuoNeb nebulizers every 6 hours 3. History of lupus- patient is not on any medications, has been started on Solu-Medrol. We'll discuss the patient's rheumatologist on Monday by phone 4. Diabetes mellitus- continue sliding scale insulin, Lantus 50 units in the morning and 40 units at bedtime 5. Hypertension- continue diltiazem, hydralazine. Will hold lisinopril and HCTZ as patient has low BP. 6. Acute on chronic CKD stage III- patient has worsening of creatinine, today creatinine is 2.07. Will hold lisinopril, HCTZ and Lasix. Follow BMP in a.m. 7. DVT prophylaxis- Lovenox  Code Status: *Full code Family Communication: *No family at bedside Disposition Plan: Home when stable   Consultants:  None  Procedures:  None  Antibiotics:  None  HPI/Subjective: Patient admitted yesterday with chest pain and generalized aches and pains. Patient has history of lupus, chest pain is now resolved, cardiac enzymes time 3 negative.   Objective: Filed Vitals:   12/29/13 0449  BP: 107/54  Pulse: 62  Temp: 97.5 F (36.4 C)  Resp: 18    Intake/Output Summary (Last 24 hours) at 12/29/13 1400 Last data filed at 12/29/13 1340  Gross per 24 hour  Intake    600 ml  Output      0 ml  Net    600 ml   Filed Weights   12/28/13 2026 12/29/13 0021  Weight: 108.41 kg (239 lb) 122.38 kg (269 lb 12.8 oz)    Exam:  Physical Exam: Head: Normocephalic, atraumatic.  Eyes: No signs of jaundice, EOMI Lungs: Normal respiratory effort. Bilateral  wheezing Heart: Regular RR. S1 and S2 normal  Abdomen: BS normoactive. Soft, Nondistended, non-tender.  Extremities: No pretibial edema, no erythema   Data Reviewed: Basic Metabolic Panel:  Recent Labs Lab 12/28/13 2045 12/29/13 0610  NA 140 142  K 4.5 4.7  CL 99 102  CO2 28 29  GLUCOSE 117* 217*  BUN 31* 36*  CREATININE 1.88* 2.07*  CALCIUM 9.7 9.5   Liver Function Tests: No results found for this basename: AST, ALT, ALKPHOS, BILITOT, PROT, ALBUMIN,  in the last 168 hours No results found for this basename: LIPASE, AMYLASE,  in the last 168 hours No results found for this basename: AMMONIA,  in the last 168 hours CBC:  Recent Labs Lab 12/28/13 2045 12/29/13 0610  WBC 8.5 8.7  NEUTROABS 5.6  --   HGB 11.3* 11.1*  HCT 34.8* 34.3*  MCV 87.4 89.3  PLT 289 282   Cardiac Enzymes:  Recent Labs Lab 12/29/13 0055 12/29/13 0610 12/29/13 1204  TROPONINI <0.30 <0.30 <0.30   BNP (last 3 results)  Recent Labs  12/28/13 2045  PROBNP 83.6   CBG:  Recent Labs Lab 12/29/13 0734 12/29/13 1205  GLUCAP 266* 168*    No results found for this or any previous visit (from the past 240 hour(s)).   Studies: Dg Chest Portable 1 View  12/28/2013   CLINICAL DATA:  Chest pain.  History of lupus.  Fatigue and nausea.  EXAM: PORTABLE CHEST - 1 VIEW  COMPARISON:  06/02/2013.  FINDINGS: The heart is mildly enlarged but stable.  The mediastinal and hilar contours are within normal limits given the rotation of the patient. Prominent interstitial markings likely due to known lupus. Streaky bibasilar atelectasis but no definite infiltrates or effusions.  IMPRESSION: Mild stable cardiac enlargement.  Chronic interstitial changes and bibasilar atelectasis.   Electronically Signed   By: Kalman Jewels M.D.   On: 12/28/2013 21:06    Scheduled Meds: . atenolol  100 mg Oral Daily  . budesonide-formoterol  2 puff Inhalation BID  . clobetasol ointment  1 application Topical Daily  .  diltiazem  300 mg Oral q morning - 10a  . enoxaparin (LOVENOX) injection  55 mg Subcutaneous Q24H  . furosemide  40 mg Oral Daily  . hydrALAZINE  25 mg Oral TID  . lisinopril  20 mg Oral Daily   And  . hydrochlorothiazide  12.5 mg Oral Daily  . insulin aspart  0-15 Units Subcutaneous TID WC  . insulin glargine  40 Units Subcutaneous QHS  . insulin glargine  50 Units Subcutaneous Daily  . ipratropium-albuterol  3 mL Nebulization Q6H  . isosorbide mononitrate  30 mg Oral Daily  . ketotifen  1 drop Both Eyes BID  . methylPREDNISolone (SOLU-MEDROL) injection  60 mg Intravenous Q6H  . pantoprazole  40 mg Oral Daily  . polyethylene glycol  17 g Oral Daily  . simvastatin  20 mg Oral q1800  . sodium chloride  3 mL Intravenous Q12H  . tiZANidine  2 mg Oral BID   Continuous Infusions:   Active Problems:   Chest pain    Time spent: *25 minutes    [facility] Pager [phone]. If 7PM-7AM, please contact night-coverage at [email or web address], password [facility] 12/29/2013, 2:00 PM  LOS: 1 day

## 2013-12-30 DIAGNOSIS — M329 Systemic lupus erythematosus, unspecified: Secondary | ICD-10-CM | POA: Diagnosis not present

## 2013-12-30 DIAGNOSIS — R079 Chest pain, unspecified: Secondary | ICD-10-CM | POA: Diagnosis not present

## 2013-12-30 LAB — BASIC METABOLIC PANEL
ANION GAP: 16 — AB (ref 5–15)
Anion gap: 13 (ref 5–15)
BUN: 45 mg/dL — ABNORMAL HIGH (ref 6–23)
BUN: 45 mg/dL — ABNORMAL HIGH (ref 6–23)
CALCIUM: 9 mg/dL (ref 8.4–10.5)
CALCIUM: 9.5 mg/dL (ref 8.4–10.5)
CO2: 25 mEq/L (ref 19–32)
CO2: 23 mEq/L (ref 19–32)
CREATININE: 1.52 mg/dL — AB (ref 0.50–1.10)
Chloride: 95 mEq/L — ABNORMAL LOW (ref 96–112)
Chloride: 95 mEq/L — ABNORMAL LOW (ref 96–112)
Creatinine, Ser: 1.71 mg/dL — ABNORMAL HIGH (ref 0.50–1.10)
GFR calc Af Amer: 33 mL/min — ABNORMAL LOW (ref >90)
GFR calc Af Amer: 38 mL/min — ABNORMAL LOW (ref >90)
GFR calc non Af Amer: 29 mL/min — ABNORMAL LOW (ref >90)
GFR, EST NON AFRICAN AMERICAN: 33 mL/min — AB (ref >90)
GLUCOSE: 490 mg/dL — AB (ref 70–99)
Glucose, Bld: 380 mg/dL — ABNORMAL HIGH (ref 70–99)
Potassium: 5.6 mEq/L — ABNORMAL HIGH (ref 3.7–5.3)
Potassium: 5.1 mEq/L (ref 3.7–5.3)
SODIUM: 133 meq/L — AB (ref 137–147)
SODIUM: 134 meq/L — AB (ref 137–147)

## 2013-12-30 LAB — GLUCOSE, CAPILLARY
Glucose-Capillary: 408 mg/dL — ABNORMAL HIGH (ref 70–99)
Glucose-Capillary: 448 mg/dL — ABNORMAL HIGH (ref 70–99)
Glucose-Capillary: 450 mg/dL — ABNORMAL HIGH (ref 70–99)

## 2013-12-30 MED ORDER — INSULIN ASPART 100 UNIT/ML ~~LOC~~ SOLN: 0.0000 [IU] | Freq: Three times a day (TID) | SUBCUTANEOUS | Status: DC

## 2013-12-30 MED ORDER — SODIUM POLYSTYRENE SULFONATE 15 GM/60ML PO SUSP
15.0000 g | Freq: Once | ORAL | Status: AC
  Administered 2013-12-30: 15 g via ORAL
  Filled 2013-12-30: qty 60

## 2013-12-30 MED ORDER — INSULIN ASPART 100 UNIT/ML ~~LOC~~ SOLN
0.0000 [IU] | Freq: Three times a day (TID) | SUBCUTANEOUS | Status: DC
  Administered 2013-12-30 (×2): 20 [IU] via SUBCUTANEOUS

## 2013-12-30 MED ORDER — HYDROCODONE-ACETAMINOPHEN 5-325 MG PO TABS: 1.0000 | ORAL_TABLET | Freq: Four times a day (QID) | ORAL | Status: DC | PRN

## 2013-12-30 MED ORDER — INSULIN ASPART 100 UNIT/ML ~~LOC~~ SOLN
10.0000 [IU] | Freq: Once | SUBCUTANEOUS | Status: AC
  Administered 2013-12-30: 10 [IU] via SUBCUTANEOUS

## 2013-12-30 MED ORDER — HYDROCHLOROTHIAZIDE 12.5 MG PO CAPS
12.5000 mg | ORAL_CAPSULE | Freq: Every day | ORAL | Status: DC
  Administered 2013-12-30: 12.5 mg via ORAL
  Filled 2013-12-30: qty 1

## 2013-12-30 MED ORDER — LISINOPRIL 20 MG PO TABS
20.0000 mg | ORAL_TABLET | Freq: Every day | ORAL | Status: DC
  Administered 2013-12-30: 20 mg via ORAL
  Filled 2013-12-30: qty 1

## 2013-12-30 MED ORDER — SODIUM CHLORIDE 0.9 % IV BOLUS (SEPSIS)
500.0000 mL | Freq: Once | INTRAVENOUS | Status: AC
  Administered 2013-12-30: 500 mL via INTRAVENOUS

## 2013-12-30 NOTE — Care Management Note (Signed)
    Page 1 of 1   12/30/2013     3:35:48 PM CARE MANAGEMENT NOTE 12/30/2013  Patient:  [name] [name], [name] [name]   Account Number:  [id number]  Date Initiated:  12/30/2013  Documentation initiated by:  Dessa Phi  Subjective/Objective Assessment:   73 Y/O F ADMITTED W/CHEST PAIN.     Action/Plan:   FROM HOME.HAS HOME 02,RW,CANE.   Anticipated DC Date:  12/31/2013   Anticipated DC Plan:  [place]  CM consult      Choice offered to / List presented to:             Status of service:  In process, will continue to follow Medicare Important Message given?   (If response is "NO", the following Medicare IM given date fields will be blank) Date Medicare IM given:   Medicare IM given by:   Date Additional Medicare IM given:   Additional Medicare IM given by:    Discharge Disposition:    Per UR Regulation:  Reviewed for med. necessity/level of care/duration of stay  If discussed at Long Length of Stay Meetings, dates discussed:    Comments:  12/30/13 Matthan Sledge RN,BSN NCM [phone] NO ANTICIPATED D/C NEEDS.

## 2013-12-30 NOTE — Discharge Summary (Signed)
Physician Discharge Summary  [name] [name] [id number] DOB: 1940-06-09 DOA: 12/28/2013  PCP: Philis Fendt, MD  Admit date: 12/28/2013 Discharge date: 12/30/2013  Time spent: 50* minutes  Recommendations for Outpatient Follow-up:  1. *Follow up PCP in 2 weeks 2.  Discharge Diagnoses:  Active Problems:   Chest pain   Discharge Condition: Stable  Diet recommendation: Low salt diet  Filed Weights   12/28/13 2026 12/29/13 0021 12/30/13 0538  Weight: 108.41 kg (239 lb) 122.38 kg (269 lb 12.8 oz) 122.38 kg (269 lb 12.8 oz)    History of present illness:  73 y.o. female  With a history of hypertension, diabetes, COPD, chronic diastolic heart failure, history of pulmonary embolism, lupus. She presents with a two day history of chest that started in her back and progressed to left substernal chest pain that was non-radiating. Also having som left arm pain additionally. Pain worse with movement, particularly twisting movements. Denies shortness of breath, diaphoresis, nausea. Does have chronic pain with arthritis and lupus and states that her back pain is similar to her lupus pain. Additionally, she does have a history of MI and states that her chest pain feels similar  Hospital Course:  Chest pain- resolved, cardiac enzymes x3 are negative, likely the chest pain is atypical in part of patient's overall flareup of lupus. We'll start the patient on Vicodin when necessary for pain.  COPD exacerbation- patient has bilateral wheezing, will start the patient on Solu-Medrol 60 mg IV every 6 hours, DuoNeb nebulizers every 6 hours. At this time the bleeding is improved, patient does not want to be on steroids. Will discontinue Solu-Medrol.  History of lupus- patient is not on any medications, has been started on Solu-Medrol. Patient does not want steroids, we'll continue to use Vicodin when necessary for pain  Diabetes mellitus- continue sliding scale insulin, Lantus 50 units in the morning and 40  units at bedtime  Hypertension- continue diltiazem, hydralazine. Will restart patient's lisinopril and HCTZ has blood pressure is now elevated Acute on chronic CKD stage III-resolved, patient's BUN/creatinine is back to baseline. Hyperkalemia- to do this morning patient's potassium was 5.6, she received one dose of Kayexalate 15 g by mouth x1 and 7 has come down to 5.1.   Procedures:  None  Consultations:  None*  Discharge Exam: Filed Vitals:   12/30/13 1412  BP: 181/84  Pulse: 77  Temp: 98.2 F (36.8 C)  Resp: 18    General: Appears in no acute distress Cardiovascular: S1-S2 regular Respiratory: Clear bilaterally  Discharge Instructions You were cared for by a hospitalist during your hospital stay. If you have any questions about your discharge medications or the care you received while you were in the hospital after you are discharged, you can call the unit and asked to speak with the hospitalist on call if the hospitalist that took care of you is not available. Once you are discharged, your primary care physician will handle any further medical issues. Please note that NO REFILLS for any discharge medications will be authorized once you are discharged, as it is imperative that you return to your primary care physician (or establish a relationship with a primary care physician if you do not have one) for your aftercare needs so that they can reassess your need for medications and monitor your lab values.  Discharge Instructions   Diet - low sodium heart healthy    Complete by:  As directed      Increase activity slowly    Complete  by:  As directed           Current Discharge Medication List    START taking these medications   Details  HYDROcodone-acetaminophen (NORCO/VICODIN) 5-325 MG per tablet Take 1 tablet by mouth every 6 (six) hours as needed for moderate pain. Qty: 30 tablet, Refills: 0      CONTINUE these medications which have NOT CHANGED   Details   albuterol (PROVENTIL) (2.5 MG/3ML) 0.083% nebulizer solution Take 2.5 mg by nebulization every 6 (six) hours as needed for wheezing or shortness of breath.    atenolol (TENORMIN) 100 MG tablet Take 100 mg by mouth daily.     budesonide (PULMICORT) 0.5 MG/2ML nebulizer solution Take 0.5 mg by nebulization every 6 (six) hours as needed (wheezing).    cholecalciferol (VITAMIN D) 1000 UNITS tablet Take 1,000 Units by mouth daily.    dexlansoprazole (DEXILANT) 60 MG capsule Take 60 mg by mouth 2 (two) times daily.    diclofenac sodium (VOLTAREN) 1 % GEL Apply 1 application topically daily.     diltiazem (TIAZAC) 300 MG 24 hr capsule Take 300 mg by mouth every morning.    furosemide (LASIX) 40 MG tablet Take 40 mg by mouth daily.     hydrALAZINE (APRESOLINE) 25 MG tablet Take 25 mg by mouth 3 (three) times daily.    insulin aspart (NOVOLOG) 100 UNIT/ML injection Inject 10 Units into the skin 3 (three) times daily before meals.     insulin glargine (LANTUS) 100 UNIT/ML injection Inject 40-50 Units into the skin 2 (two) times daily. 50 units in the morning 40 units at bedtime    isosorbide mononitrate (IMDUR) 30 MG 24 hr tablet Take 30 mg by mouth every morning.    ketotifen (ALAWAY) 0.025 % ophthalmic solution Place 1 drop into both eyes 2 (two) times daily.    lisinopril-hydrochlorothiazide (PRINZIDE,ZESTORETIC) 20-12.5 MG per tablet Take 1 tablet by mouth daily.    polyethylene glycol (MIRALAX / GLYCOLAX) packet Take 17 g by mouth daily.    pravastatin (PRAVACHOL) 40 MG tablet Take 40 mg by mouth at bedtime.     SYMBICORT 160-4.5 MCG/ACT inhaler Inhale 2 puffs into the lungs 2 (two) times daily.     tiZANidine (ZANAFLEX) 2 MG tablet Take 2 mg by mouth 2 (two) times daily.     vitamin B-12 (CYANOCOBALAMIN) 1000 MCG tablet Take 1,000 mcg by mouth daily.    albuterol (PROVENTIL HFA;VENTOLIN HFA) 108 (90 BASE) MCG/ACT inhaler Inhale 2 puffs into the lungs every 6 (six) hours as needed  for wheezing or shortness of breath.    clobetasol ointment (TEMOVATE) [id number] % Apply 1 application topically daily.     cyclobenzaprine (FLEXERIL) 10 MG tablet Take 1 tablet (10 mg total) by mouth 2 (two) times daily as needed for muscle spasms. Qty: 20 tablet, Refills: 0    desonide (DESOWEN) 0.05 % cream Apply 1 application topically daily.     diphenhydrAMINE (BENADRYL) 25 MG tablet Take 25 mg by mouth every 6 (six) hours as needed for itching or allergies.     promethazine (PHENERGAN) 25 MG tablet Take 1 tablet (25 mg total) by mouth every 6 (six) hours as needed for nausea. Qty: 20 tablet, Refills: 0      STOP taking these medications     acetaminophen (TYLENOL) 650 MG CR tablet        Allergies  Allergen Reactions  . Penicillins Other (See Comments)    Swells up  . Fentanyl Itching  .  Peach [Prunus Persica] Hives  . Shellfish Allergy Hives   Follow-up Information   Follow up with AVBUERE,EDWIN A, MD In 2 weeks.   Specialty:  Internal Medicine   Contact information:   [address] [place] [address] [phone]        The results of significant diagnostics from this hospitalization (including imaging, microbiology, ancillary and laboratory) are listed below for reference.    Significant Diagnostic Studies: Dg Chest Portable 1 View  12/28/2013   CLINICAL DATA:  Chest pain.  History of lupus.  Fatigue and nausea.  EXAM: PORTABLE CHEST - 1 VIEW  COMPARISON:  06/02/2013.  FINDINGS: The heart is mildly enlarged but stable. The mediastinal and hilar contours are within normal limits given the rotation of the patient. Prominent interstitial markings likely due to known lupus. Streaky bibasilar atelectasis but no definite infiltrates or effusions.  IMPRESSION: Mild stable cardiac enlargement.  Chronic interstitial changes and bibasilar atelectasis.   Electronically Signed   By: Kalman Jewels M.D.   On: 12/28/2013 21:06    Microbiology: No results found for this  or any previous visit (from the past 240 hour(s)).   Labs: Basic Metabolic Panel:  Recent Labs Lab 12/28/13 2045 12/29/13 0610 12/29/13 2316 12/30/13 0435 12/30/13 1438  NA 140 142 131* 133* 134*  K 4.5 4.7 5.6* 5.6* 5.1  CL 99 102 92* 95* 95*  CO2 28 29 26 25 23   GLUCOSE 117* 217* 503* 490* 380*  BUN 31* 36* 42* 45* 45*  CREATININE 1.88* 2.07* 1.87* 1.71* 1.52*  CALCIUM 9.7 9.5 9.0 9.0 9.5   Liver Function Tests: No results found for this basename: AST, ALT, ALKPHOS, BILITOT, PROT, ALBUMIN,  in the last 168 hours No results found for this basename: LIPASE, AMYLASE,  in the last 168 hours No results found for this basename: AMMONIA,  in the last 168 hours CBC:  Recent Labs Lab 12/28/13 2045 12/29/13 0610  WBC 8.5 8.7  NEUTROABS 5.6  --   HGB 11.3* 11.1*  HCT 34.8* 34.3*  MCV 87.4 89.3  PLT 289 282   Cardiac Enzymes:  Recent Labs Lab 12/29/13 0055 12/29/13 0610 12/29/13 1204  TROPONINI <0.30 <0.30 <0.30   BNP: BNP (last 3 results)  Recent Labs  12/28/13 2045  PROBNP 83.6   CBG:  Recent Labs Lab 12/29/13 1205 12/29/13 1704 12/29/13 2113 12/30/13 0746 12/30/13 1133  GLUCAP 168* 196* 460* 408* 448*       Signed:  Lorne Winkels S  [facility] 12/30/2013, 4:02 PM

## 2013-12-30 NOTE — Progress Notes (Signed)
Utilization review completed.  

## 2013-12-30 NOTE — Progress Notes (Signed)
Inpatient Diabetes Program Recommendations  AACE/ADA: New Consensus Statement on Inpatient Glycemic Control (2013)  Target Ranges:  Prepandial:   less than 140 mg/dL      Peak postprandial:   less than 180 mg/dL (1-2 hours)      Critically ill patients:  140 - 180 mg/dL   Reason for Visit: Hyperglycemia  Diabetes history: DM2 Outpatient Diabetes medications: Lantus 50 in am and 40 QHS, Novolog 10 units tidwc Current orders for Inpatient glycemic control: Lantus 50 in am and 40 QHS, Novolog resistant tidwc  Solumedrol 60 Q6H discontinued.   Inpatient Diabetes Program Recommendations Insulin - Meal Coverage: Add Novolog 8 units tidwc for meal coverage insulin HgbA1C: Check HgbA1C to assess glycemic control prior to hospitalization  Note: Will continue to follow. Thank you. Lorenda Peck, RD, LDN, CDE Inpatient Diabetes Coordinator [phone]

## 2014-04-01 ENCOUNTER — Encounter: Payer: Self-pay | Admitting: Cardiovascular Disease

## 2014-04-01 ENCOUNTER — Ambulatory Visit (INDEPENDENT_AMBULATORY_CARE_PROVIDER_SITE_OTHER): Payer: Medicare HMO | Admitting: Cardiovascular Disease

## 2014-04-01 VITALS — BP 148/70 | HR 89 | Ht 65.0 in | Wt 277.7 lb

## 2014-04-01 DIAGNOSIS — R079 Chest pain, unspecified: Secondary | ICD-10-CM | POA: Diagnosis not present

## 2014-04-01 DIAGNOSIS — I10 Essential (primary) hypertension: Secondary | ICD-10-CM

## 2014-04-01 DIAGNOSIS — I5032 Chronic diastolic (congestive) heart failure: Secondary | ICD-10-CM | POA: Diagnosis not present

## 2014-04-01 DIAGNOSIS — E785 Hyperlipidemia, unspecified: Secondary | ICD-10-CM

## 2014-04-01 DIAGNOSIS — I2699 Other pulmonary embolism without acute cor pulmonale: Secondary | ICD-10-CM

## 2014-04-01 NOTE — Assessment & Plan Note (Signed)
History of pulmonary embolism by CT angiography in the past. She was on Coumadin anticoagulant shin which was discontinued by her primary care physician.

## 2014-04-01 NOTE — Patient Instructions (Signed)
Your physician wants you to follow-up in 1 year with Dr. Berry. You will receive a reminder letter in the mail 2 months in advance. If you do not receive a letter, please call our office to schedule the follow-up appointment.  

## 2014-04-01 NOTE — Progress Notes (Signed)
04/01/2014 [name] [name]   [birth year]  [id number]  Primary Physician Philis Fendt, MD Primary Cardiologist: Lorretta Harp MD Renae Gloss   HPI:  The patient is a 73 year old severely overweight widowed [place] American female, mother of 35 and grandmother to 2 grandchildren, whom I last saw 1 year ago. She has a history of congestive heart failure probably related to diastolic dysfunction with normal LV function. She had moderate concentric LVH and grade 1 diastolic dysfunction by echo January 25, 2012. Her other problems include hypertension, non-insulin-dependent diabetes, hyperlipidemia, and obstructive sleep apnea. She had a negative Myoview May 02, 2008. She was admitted October 8-13 of this year with shortness of breath. The CT angiogram showed multiple small pulmonary emboli and she was coumadinized. Her lower extremity venous Dopplers were negative. Her primary care physician has since stopped her Coumadin anticoagulation. Since I saw her a year ago she's been asymptomatic other than chronic shortness of breath. She has been hospitalized twice however for lupus-related issues.   Current Outpatient Prescriptions  Medication Sig Dispense Refill  . albuterol (PROVENTIL HFA;VENTOLIN HFA) 108 (90 BASE) MCG/ACT inhaler Inhale 2 puffs into the lungs every 6 (six) hours as needed for wheezing or shortness of breath.    Marland Kitchen albuterol (PROVENTIL) (2.5 MG/3ML) 0.083% nebulizer solution Take 2.5 mg by nebulization every 6 (six) hours as needed for wheezing or shortness of breath.    Marland Kitchen atenolol (TENORMIN) 100 MG tablet Take 100 mg by mouth daily.     . budesonide (PULMICORT) 0.5 MG/2ML nebulizer solution Take 0.5 mg by nebulization every 6 (six) hours as needed (wheezing).    . cholecalciferol (VITAMIN D) 1000 UNITS tablet Take 1,000 Units by mouth daily.    . clobetasol ointment (TEMOVATE) AB-123456789 % Apply 1 application topically daily.     Marland Kitchen desonide (DESOWEN) 0.05 % cream Apply 1  application topically daily.     Marland Kitchen dexlansoprazole (DEXILANT) 60 MG capsule Take 60 mg by mouth 2 (two) times daily.    . diclofenac sodium (VOLTAREN) 1 % GEL Apply 1 application topically daily.     Marland Kitchen diltiazem (CARTIA XT) 300 MG 24 hr capsule Take 300 mg by mouth daily.    . furosemide (LASIX) 40 MG tablet Take 40 mg by mouth daily.     . hydrALAZINE (APRESOLINE) 25 MG tablet Take 25 mg by mouth 3 (three) times daily.    Marland Kitchen HYDROcodone-acetaminophen (NORCO/VICODIN) 5-325 MG per tablet Take 1 tablet by mouth every 6 (six) hours as needed for moderate pain. 30 tablet 0  . insulin aspart (NOVOLOG) 100 UNIT/ML injection Inject 10 Units into the skin 3 (three) times daily before meals.     . insulin glargine (LANTUS) 100 UNIT/ML injection Inject 40-50 Units into the skin 2 (two) times daily. 50 units in the morning 40 units at bedtime    . isosorbide mononitrate (IMDUR) 30 MG 24 hr tablet Take 30 mg by mouth every morning.    Marland Kitchen ketotifen (ALAWAY) 0.025 % ophthalmic solution Place 1 drop into both eyes 2 (two) times daily.    Marland Kitchen lisinopril-hydrochlorothiazide (PRINZIDE,ZESTORETIC) 20-12.5 MG per tablet Take 1 tablet by mouth daily.    . pravastatin (PRAVACHOL) 40 MG tablet Take 40 mg by mouth at bedtime.     . SYMBICORT 160-4.5 MCG/ACT inhaler Inhale 2 puffs into the lungs 2 (two) times daily.     . vitamin B-12 (CYANOCOBALAMIN) 1000 MCG tablet Take 1,000 mcg by mouth daily.  No current facility-administered medications for this visit.    Allergies  Allergen Reactions  . Penicillins Other (See Comments)    Swells up  . Fentanyl Itching  . Peach [Prunus Persica] Hives  . Shellfish Allergy Hives    History   Social History  . Marital Status: Single    Spouse Name: N/A    Number of Children: N/A  . Years of Education: N/A   Occupational History  . Not on file.   Social History Main Topics  . Smoking status: Former Research scientist (life sciences)  . Smokeless tobacco: Never Used  . Alcohol Use: No  .  Drug Use: No  . Sexual Activity: Not on file   Other Topics Concern  . Not on file   Social History Narrative     Review of Systems: General: negative for chills, fever, night sweats or weight changes.  Cardiovascular: negative for chest pain, dyspnea on exertion, edema, orthopnea, palpitations, paroxysmal nocturnal dyspnea or shortness of breath Dermatological: negative for rash Respiratory: negative for cough or wheezing Urologic: negative for hematuria Abdominal: negative for nausea, vomiting, diarrhea, bright red blood per rectum, melena, or hematemesis Neurologic: negative for visual changes, syncope, or dizziness All other systems reviewed and are otherwise negative except as noted above.    Blood pressure 148/70, pulse 89, height 5\' 5"  (1.651 m), weight 277 lb 11.2 oz (125.964 kg).  General appearance: alert and no distress Neck: no adenopathy, no carotid bruit, no JVD, supple, symmetrical, trachea midline and thyroid not enlarged, symmetric, no tenderness/mass/nodules Lungs: clear to auscultation bilaterally Heart: regular rate and rhythm, S1, S2 normal, no murmur, click, rub or gallop Extremities: extremities normal, atraumatic, no cyanosis or edema  EKG normal sinus rhythm at 89 with right bundle branch block. This is unchanged from prior EKGs. I personally reviewed this EKG.  ASSESSMENT AND PLAN:   Pulmonary embolism History of pulmonary embolism by CT angiography in the past. She was on Coumadin anticoagulant shin which was discontinued by her primary care physician.  Chronic diastolic heart failure History of preserved left ventricular ejection fraction with diastolic dysfunction and diastolic heart failure in the past on appropriate medications.  Hypertension History of hypertension with blood pressure measured today at 148/70. She is on atenolol, diltiazem, hydralazine, lisinopril/hydrochlorothiazide. Continue current medications her current  dosing.  Hyperlipidemia History of hyperlipidemia on pravastatin 40 mg a day followed by her PCP. Apparently this is going to be checked next month at a routine office visit.      Lorretta Harp MD FACP,FACC,FAHA, [facility] 04/01/2014 8:45 AM

## 2014-04-01 NOTE — Assessment & Plan Note (Addendum)
History of preserved left ventricular ejection fraction with diastolic dysfunction and diastolic heart failure in the past on appropriate medications.

## 2014-04-01 NOTE — Assessment & Plan Note (Signed)
History of hyperlipidemia on pravastatin 40 mg a day followed by her PCP. Apparently this is going to be checked next month at a routine office visit.

## 2014-04-01 NOTE — Assessment & Plan Note (Signed)
History of hypertension with blood pressure measured today at 148/70. She is on atenolol, diltiazem, hydralazine, lisinopril/hydrochlorothiazide. Continue current medications her current dosing.

## 2014-04-25 ENCOUNTER — Other Ambulatory Visit: Payer: Self-pay

## 2014-04-25 DIAGNOSIS — Z1231 Encounter for screening mammogram for malignant neoplasm of breast: Secondary | ICD-10-CM

## 2014-05-21 ENCOUNTER — Ambulatory Visit
Admission: RE | Admit: 2014-05-21 | Discharge: 2014-05-21 | Disposition: A | Discharge status: Home / Self Care | Payer: Medicare PPO | Source: Ambulatory Visit

## 2014-05-21 DIAGNOSIS — Z1231 Encounter for screening mammogram for malignant neoplasm of breast: Secondary | ICD-10-CM

## 2014-07-13 ENCOUNTER — Emergency Department (HOSPITAL_COMMUNITY): Payer: Medicare HMO

## 2014-07-13 ENCOUNTER — Encounter (HOSPITAL_COMMUNITY): Payer: Self-pay | Admitting: *Deleted

## 2014-07-13 ENCOUNTER — Emergency Department (HOSPITAL_COMMUNITY)
Admission: EM | Admit: 2014-07-13 | Discharge: 2014-07-13 | Disposition: A | Discharge status: Home / Self Care | Payer: Medicare HMO | Attending: Emergency Medicine | Admitting: Emergency Medicine

## 2014-07-13 DIAGNOSIS — Z794 Long term (current) use of insulin: Secondary | ICD-10-CM | POA: Diagnosis not present

## 2014-07-13 DIAGNOSIS — Z9981 Dependence on supplemental oxygen: Secondary | ICD-10-CM | POA: Insufficient documentation

## 2014-07-13 DIAGNOSIS — E785 Hyperlipidemia, unspecified: Secondary | ICD-10-CM | POA: Insufficient documentation

## 2014-07-13 DIAGNOSIS — E669 Obesity, unspecified: Secondary | ICD-10-CM | POA: Insufficient documentation

## 2014-07-13 DIAGNOSIS — E119 Type 2 diabetes mellitus without complications: Secondary | ICD-10-CM | POA: Insufficient documentation

## 2014-07-13 DIAGNOSIS — Z87891 Personal history of nicotine dependence: Secondary | ICD-10-CM | POA: Diagnosis not present

## 2014-07-13 DIAGNOSIS — R079 Chest pain, unspecified: Secondary | ICD-10-CM | POA: Insufficient documentation

## 2014-07-13 DIAGNOSIS — M79602 Pain in left arm: Secondary | ICD-10-CM

## 2014-07-13 DIAGNOSIS — Z880 Allergy status to penicillin: Secondary | ICD-10-CM | POA: Diagnosis not present

## 2014-07-13 DIAGNOSIS — G4730 Sleep apnea, unspecified: Secondary | ICD-10-CM | POA: Diagnosis not present

## 2014-07-13 DIAGNOSIS — J441 Chronic obstructive pulmonary disease with (acute) exacerbation: Secondary | ICD-10-CM | POA: Insufficient documentation

## 2014-07-13 DIAGNOSIS — Z791 Long term (current) use of non-steroidal anti-inflammatories (NSAID): Secondary | ICD-10-CM | POA: Insufficient documentation

## 2014-07-13 DIAGNOSIS — I10 Essential (primary) hypertension: Secondary | ICD-10-CM | POA: Diagnosis not present

## 2014-07-13 DIAGNOSIS — Z7982 Long term (current) use of aspirin: Secondary | ICD-10-CM | POA: Insufficient documentation

## 2014-07-13 DIAGNOSIS — I509 Heart failure, unspecified: Secondary | ICD-10-CM | POA: Diagnosis not present

## 2014-07-13 DIAGNOSIS — M79606 Pain in leg, unspecified: Secondary | ICD-10-CM

## 2014-07-13 DIAGNOSIS — Z853 Personal history of malignant neoplasm of breast: Secondary | ICD-10-CM | POA: Diagnosis not present

## 2014-07-13 DIAGNOSIS — R52 Pain, unspecified: Secondary | ICD-10-CM

## 2014-07-13 DIAGNOSIS — Z872 Personal history of diseases of the skin and subcutaneous tissue: Secondary | ICD-10-CM | POA: Insufficient documentation

## 2014-07-13 DIAGNOSIS — Z79899 Other long term (current) drug therapy: Secondary | ICD-10-CM | POA: Diagnosis not present

## 2014-07-13 LAB — COMPREHENSIVE METABOLIC PANEL
ALBUMIN: 4.1 g/dL (ref 3.5–5.2)
ALT: 18 U/L (ref 0–35)
ANION GAP: 11 (ref 5–15)
AST: 29 U/L (ref 0–37)
Alkaline Phosphatase: 87 U/L (ref 39–117)
BUN: 37 mg/dL — ABNORMAL HIGH (ref 6–23)
CALCIUM: 9.5 mg/dL (ref 8.4–10.5)
CO2: 27 mmol/L (ref 19–32)
CREATININE: 1.52 mg/dL — AB (ref 0.50–1.10)
Chloride: 99 mmol/L (ref 96–112)
GFR calc non Af Amer: 33 mL/min — ABNORMAL LOW (ref >90)
GFR, EST AFRICAN AMERICAN: 38 mL/min — AB (ref >90)
GLUCOSE: 208 mg/dL — AB (ref 70–99)
Potassium: 4.8 mmol/L (ref 3.5–5.1)
Sodium: 137 mmol/L (ref 135–145)
Total Bilirubin: 0.9 mg/dL (ref 0.3–1.2)
Total Protein: 8.7 g/dL — ABNORMAL HIGH (ref 6.0–8.3)

## 2014-07-13 LAB — URINALYSIS, ROUTINE W REFLEX MICROSCOPIC
BILIRUBIN URINE: NEGATIVE
Glucose, UA: NEGATIVE mg/dL
Hgb urine dipstick: NEGATIVE
Ketones, ur: NEGATIVE mg/dL
Nitrite: NEGATIVE
Protein, ur: 30 mg/dL — AB
Specific Gravity, Urine: 1.017 (ref 1.005–1.030)
Urobilinogen, UA: 0.2 mg/dL (ref 0.0–1.0)
pH: 6 (ref 5.0–8.0)

## 2014-07-13 LAB — CBC WITH DIFFERENTIAL/PLATELET
BASOS ABS: 0 10*3/uL (ref 0.0–0.1)
Basophils Relative: 0 % (ref 0–1)
EOS PCT: 1 % (ref 0–5)
Eosinophils Absolute: 0.1 10*3/uL (ref 0.0–0.7)
HEMATOCRIT: 35.7 % — AB (ref 36.0–46.0)
Hemoglobin: 11.5 g/dL — ABNORMAL LOW (ref 12.0–15.0)
LYMPHS PCT: 13 % (ref 12–46)
Lymphs Abs: 1.6 10*3/uL (ref 0.7–4.0)
MCH: 28.4 pg (ref 26.0–34.0)
MCHC: 32.2 g/dL (ref 30.0–36.0)
MCV: 88.1 fL (ref 78.0–100.0)
Monocytes Absolute: 0.5 10*3/uL (ref 0.1–1.0)
Monocytes Relative: 4 % (ref 3–12)
NEUTROS ABS: 9.8 10*3/uL — AB (ref 1.7–7.7)
Neutrophils Relative %: 82 % — ABNORMAL HIGH (ref 43–77)
Platelets: 349 10*3/uL (ref 150–400)
RBC: 4.05 MIL/uL (ref 3.87–5.11)
RDW: 14.5 % (ref 11.5–15.5)
WBC: 12 10*3/uL — ABNORMAL HIGH (ref 4.0–10.5)

## 2014-07-13 LAB — URINE MICROSCOPIC-ADD ON

## 2014-07-13 LAB — TROPONIN I

## 2014-07-13 LAB — BRAIN NATRIURETIC PEPTIDE: B Natriuretic Peptide: 38.2 pg/mL (ref 0.0–100.0)

## 2014-07-13 MED ORDER — MORPHINE SULFATE 4 MG/ML IJ SOLN
4.0000 mg | Freq: Once | INTRAMUSCULAR | Status: AC
  Administered 2014-07-13: 4 mg via INTRAVENOUS
  Filled 2014-07-13: qty 1

## 2014-07-13 MED ORDER — ISOSORBIDE MONONITRATE ER 30 MG PO TB24
30.0000 mg | ORAL_TABLET | Freq: Once | ORAL | Status: AC
  Administered 2014-07-13: 30 mg via ORAL
  Filled 2014-07-13: qty 1

## 2014-07-13 MED ORDER — DILTIAZEM HCL ER COATED BEADS 300 MG PO CP24
300.0000 mg | ORAL_CAPSULE | Freq: Once | ORAL | Status: AC
  Administered 2014-07-13: 300 mg via ORAL
  Filled 2014-07-13: qty 1

## 2014-07-13 MED ORDER — METHYLPREDNISOLONE SODIUM SUCC 125 MG IJ SOLR
125.0000 mg | Freq: Once | INTRAMUSCULAR | Status: AC
  Administered 2014-07-13: 125 mg via INTRAVENOUS
  Filled 2014-07-13: qty 2

## 2014-07-13 MED ORDER — FLUORESCEIN SODIUM 1 MG OP STRP: 1.0000 | ORAL_STRIP | Freq: Once | OPHTHALMIC | Status: DC

## 2014-07-13 MED ORDER — HYDROMORPHONE HCL 1 MG/ML IJ SOLN
1.0000 mg | Freq: Once | INTRAMUSCULAR | Status: AC
  Administered 2014-07-13: 1 mg via INTRAVENOUS
  Filled 2014-07-13: qty 1

## 2014-07-13 MED ORDER — ATENOLOL 100 MG PO TABS
100.0000 mg | ORAL_TABLET | Freq: Once | ORAL | Status: AC
  Administered 2014-07-13: 100 mg via ORAL
  Filled 2014-07-13: qty 1

## 2014-07-13 MED ORDER — PREDNISONE 10 MG PO TABS
60.0000 mg | ORAL_TABLET | Freq: Every day | ORAL | Status: DC
Start: 2014-07-14 — End: 2014-09-01

## 2014-07-13 MED ORDER — LISINOPRIL-HYDROCHLOROTHIAZIDE 20-12.5 MG PO TABS: 1.0000 | ORAL_TABLET | Freq: Once | ORAL | Status: DC

## 2014-07-13 MED ORDER — HYDRALAZINE HCL 25 MG PO TABS
25.0000 mg | ORAL_TABLET | Freq: Once | ORAL | Status: AC
  Administered 2014-07-13: 25 mg via ORAL
  Filled 2014-07-13: qty 1

## 2014-07-13 MED ORDER — DIPHENHYDRAMINE HCL 50 MG/ML IJ SOLN
12.5000 mg | Freq: Once | INTRAMUSCULAR | Status: AC
  Administered 2014-07-13: 12.5 mg via INTRAVENOUS
  Filled 2014-07-13: qty 1

## 2014-07-13 MED ORDER — TETRACAINE HCL 0.5 % OP SOLN: 2.0000 [drp] | Freq: Once | OPHTHALMIC | Status: DC

## 2014-07-13 MED ORDER — FUROSEMIDE 40 MG PO TABS
40.0000 mg | ORAL_TABLET | Freq: Once | ORAL | Status: AC
  Administered 2014-07-13: 40 mg via ORAL
  Filled 2014-07-13: qty 1

## 2014-07-13 MED ORDER — HYDROCHLOROTHIAZIDE 12.5 MG PO CAPS
12.5000 mg | ORAL_CAPSULE | Freq: Once | ORAL | Status: AC
  Administered 2014-07-13: 12.5 mg via ORAL
  Filled 2014-07-13: qty 1

## 2014-07-13 MED ORDER — LISINOPRIL 20 MG PO TABS
20.0000 mg | ORAL_TABLET | Freq: Once | ORAL | Status: AC
  Administered 2014-07-13: 20 mg via ORAL
  Filled 2014-07-13: qty 1

## 2014-07-13 NOTE — ED Notes (Signed)
Pt called sister to pick her up.

## 2014-07-13 NOTE — ED Notes (Signed)
Pt continues to itch. Dr Doy Mince notified

## 2014-07-13 NOTE — ED Notes (Signed)
Pt sts that she is itching from morphine. Dr Doy Mince made aware

## 2014-07-13 NOTE — ED Notes (Signed)
Patient can not use bathroom at this time

## 2014-07-13 NOTE — ED Notes (Signed)
Pt arrived via EMS with report of Lupus flare up. Pt reported severe pain in LUA, bil leg and lt ear. Able to bear some weight. LROM to lt arm. Denies injury/trauma. No deformity/bruising to lt arm or BLE. (+)PMS, CRT brisk.

## 2014-07-13 NOTE — ED Notes (Signed)
Pt ambulated with standby assist maintaining her O2 at 92% with 2 L O2 that she is always on

## 2014-07-13 NOTE — ED Notes (Signed)
CGB: 205,  Lupus flare past 2 day and Rt ear pain x 1 week.

## 2014-07-13 NOTE — ED Notes (Signed)
Bed: [facility] Expected date:  Expected time:  Means of arrival:  Comments: EMS body pain with hx lupus

## 2014-07-13 NOTE — ED Notes (Signed)
Patient transported to X-ray 

## 2014-07-13 NOTE — ED Provider Notes (Signed)
CSN: [id number]     Arrival date & time 07/13/14  0645 History   First MD Initiated Contact with Patient 07/13/14 [phone]     Chief Complaint  Patient presents with  . Lupus     (Consider location/radiation/quality/duration/timing/severity/associated sxs/prior Treatment) HPI Comments: 74 yo female with a history of lupus who presents with "Lupus".    Patient is a 74 y.o. female presenting with general illness.  Illness Location:  BLE, LUE, back Severity:  Severe Onset quality:  Gradual Duration:  2 days Timing:  Constant Progression:  Worsening Chronicity:  Recurrent Context:  Pt does not know any specific thing that causes her Lupus to flare.  "It's just mean!" Relieved by:  Nothing Worsened by:  Moving extremities Ineffective treatments:  Tylenol Associated symptoms: fever (yesterday) and shortness of breath   Associated symptoms: no abdominal pain, no chest pain, no nausea and no vomiting     Past Medical History  Diagnosis Date  . COPD (chronic obstructive pulmonary disease)   . Diabetes mellitus without complication   . CHF (congestive heart failure)   . Lupus   . Cancer     breast cancer  . Pulmonary embolism 01/2012     CT showed multi small PE and coumadized   . Sleep apnea 2010    C-PAP machine  . Systemic lupus erythematosus   . Hypertension   . Hyperlipidemia   . Obesity    Past Surgical History  Procedure Laterality Date  . Mastectomy      right side  . Abdominal hysterectomy  1976  . Exploratory laparotomy    . Back surgery    . Doppler echocardiography  01/25/2012    EF 55 TO 60%; LV norm.  [facility] Curia myoview  05/02/2008    EF 67% ; LV norm  . Lower extrem. venous doppler  01/25/2012     neg.   Family History  Problem Relation Age of Onset  . Heart failure Mother   . Heart failure Father   . Diabetes Brother    History  Substance Use Topics  . Smoking status: Former Research scientist (life sciences)  . Smokeless tobacco: Never Used  . Alcohol Use: No   OB  History    No data available     Review of Systems  Constitutional: Positive for fever (yesterday).  Respiratory: Positive for shortness of breath.   Cardiovascular: Negative for chest pain.  Gastrointestinal: Negative for nausea, vomiting and abdominal pain.  All other systems reviewed and are negative.     Allergies  Penicillins; Fentanyl; Peach; and Shellfish allergy  Home Medications   Prior to Admission medications   Medication Sig Start Date End Date Taking? Authorizing Provider  acetaminophen (TYLENOL) 650 MG CR tablet Take 650 mg by mouth every 8 (eight) hours as needed for pain.   Yes Historical Provider, MD  albuterol (PROVENTIL HFA;VENTOLIN HFA) 108 (90 BASE) MCG/ACT inhaler Inhale 2 puffs into the lungs every 6 (six) hours as needed for wheezing or shortness of breath.   Yes Historical Provider, MD  albuterol (PROVENTIL) (2.5 MG/3ML) 0.083% nebulizer solution Take 2.5 mg by nebulization every 6 (six) hours as needed for wheezing or shortness of breath.   Yes Historical Provider, MD  alendronate (FOSAMAX) 70 MG tablet Take 70 mg by mouth every Monday. Take with a full glass of water on an empty stomach.   Yes Historical Provider, MD  aspirin 325 MG tablet Take 325 mg by mouth daily.   Yes Historical Provider, MD  atenolol (TENORMIN) 100 MG tablet Take 100 mg by mouth daily.    Yes Historical Provider, MD  budesonide (PULMICORT) 0.5 MG/2ML nebulizer solution Take 0.5 mg by nebulization every 6 (six) hours as needed (wheezing).   Yes Historical Provider, MD  carbamide peroxide (DEBROX) 6.5 % otic solution Place 5 drops into both ears daily.   Yes Historical Provider, MD  cholecalciferol (VITAMIN D) 1000 UNITS tablet Take 1,000 Units by mouth daily.   Yes Historical Provider, MD  clobetasol ointment (TEMOVATE) [id number] % Apply 1 application topically daily.    Yes Historical Provider, MD  desonide (DESOWEN) 0.05 % cream Apply 1 application topically daily.    Yes Historical  Provider, MD  dexlansoprazole (DEXILANT) 60 MG capsule Take 60 mg by mouth 2 (two) times daily.   Yes Historical Provider, MD  diltiazem (CARTIA XT) 300 MG 24 hr capsule Take 300 mg by mouth daily.   Yes Historical Provider, MD  diphenhydrAMINE (BENADRYL) 25 MG tablet Take 25 mg by mouth every 6 (six) hours as needed for itching.   Yes Historical Provider, MD  furosemide (LASIX) 40 MG tablet Take 40 mg by mouth daily.    Yes Historical Provider, MD  hydrALAZINE (APRESOLINE) 25 MG tablet Take 25 mg by mouth 3 (three) times daily.   Yes Historical Provider, MD  hydroxychloroquine (PLAQUENIL) 200 MG tablet Take 200 mg by mouth 2 (two) times daily.   Yes Historical Provider, MD  insulin aspart (NOVOLOG) 100 UNIT/ML injection Inject 15 Units into the skin 3 (three) times daily before meals.    Yes Historical Provider, MD  insulin glargine (LANTUS) 100 UNIT/ML injection Inject 60 Units into the skin 2 (two) times daily. 50 units in the morning 40 units at bedtime 01/29/12  Yes Ripudeep K Rai, MD  isosorbide mononitrate (IMDUR) 30 MG 24 hr tablet Take 30 mg by mouth every morning.   Yes Historical Provider, MD  ketotifen (ALAWAY) 0.025 % ophthalmic solution Place 1 drop into both eyes 2 (two) times daily.   Yes Historical Provider, MD  latanoprost (XALATAN) 0.005 % ophthalmic solution Place 1 drop into both eyes at bedtime.   Yes Historical Provider, MD  lisinopril-hydrochlorothiazide (PRINZIDE,ZESTORETIC) 20-12.5 MG per tablet Take 1 tablet by mouth daily. 03/08/13  Yes Historical Provider, MD  pravastatin (PRAVACHOL) 40 MG tablet Take 40 mg by mouth at bedtime.    Yes Historical Provider, MD  SYMBICORT 160-4.5 MCG/ACT inhaler Inhale 2 puffs into the lungs 2 (two) times daily.  04/22/13  Yes Historical Provider, MD  vitamin B-12 (CYANOCOBALAMIN) 1000 MCG tablet Take 1,000 mcg by mouth daily.   Yes Historical Provider, MD  diclofenac sodium (VOLTAREN) 1 % GEL Apply 1 application topically daily.      Historical Provider, MD  HYDROcodone-acetaminophen (NORCO/VICODIN) 5-325 MG per tablet Take 1 tablet by mouth every 6 (six) hours as needed for moderate pain. Patient not taking: Reported on 07/13/2014 12/30/13   Oswald Hillock, MD   BP 156/91 mmHg  Pulse 95  Temp(Src) 98.4 F (36.9 C) (Oral)  SpO2 98% Physical Exam  Constitutional: She is oriented to person, place, and time. She appears well-developed and well-nourished. No distress.  HENT:  Head: Normocephalic and atraumatic.  Right Ear: Hearing, tympanic membrane, external ear and ear canal normal.  Left Ear: Hearing, tympanic membrane, external ear and ear canal normal.  Mouth/Throat: Oropharynx is clear and moist.  Eyes: Conjunctivae are normal. Pupils are equal, round, and reactive to light. No scleral icterus.  Neck:  Neck supple.  Cardiovascular: Normal rate, regular rhythm, normal heart sounds and intact distal pulses.   No murmur heard. Pulmonary/Chest: Effort normal and breath sounds normal. No stridor. No respiratory distress. She has no rales. She exhibits tenderness.  Abdominal: Soft. Bowel sounds are normal. She exhibits no distension. There is no tenderness. There is no rebound and no guarding.  Musculoskeletal: Normal range of motion. She exhibits edema (BLE 1+).       Left shoulder: She exhibits tenderness. She exhibits normal range of motion (pain with ROm), no swelling, no effusion, normal pulse and normal strength.  Hypersensitivity to touch all over, but seemingly worse in bilateral legs and left arm.   Neurological: She is alert and oriented to person, place, and time.  Skin: Skin is warm and dry. No rash noted.  Psychiatric: She has a normal mood and affect. Her behavior is normal.  Nursing note and vitals reviewed.   ED Course  Procedures (including critical care time) Labs Review Labs Reviewed  CBC WITH DIFFERENTIAL/PLATELET - Abnormal; Notable for the following:    WBC 12.0 (*)    Hemoglobin 11.5 (*)    HCT  35.7 (*)    Neutrophils Relative % 82 (*)    Neutro Abs 9.8 (*)    All other components within normal limits  URINALYSIS, ROUTINE W REFLEX MICROSCOPIC - Abnormal; Notable for the following:    APPearance CLOUDY (*)    Protein, ur 30 (*)    Leukocytes, UA SMALL (*)    All other components within normal limits  COMPREHENSIVE METABOLIC PANEL - Abnormal; Notable for the following:    Glucose, Bld 208 (*)    BUN 37 (*)    Creatinine, Ser 1.52 (*)    Total Protein 8.7 (*)    GFR calc non Af Amer 33 (*)    GFR calc Af Amer 38 (*)    All other components within normal limits  URINE MICROSCOPIC-ADD ON - Abnormal; Notable for the following:    Squamous Epithelial / LPF MANY (*)    Bacteria, UA FEW (*)    All other components within normal limits  TROPONIN I  BRAIN NATRIURETIC PEPTIDE    Imaging Review Dg Chest Port 1 View  07/13/2014   CLINICAL DATA:  Two day history of cough, congestion, and difficulty breathing  EXAM: PORTABLE CHEST - 1 VIEW  COMPARISON:  June 12, 2014  FINDINGS: There is no edema or consolidation. Heart is mildly enlarged with pulmonary vascularity within normal limits. No adenopathy. No bone lesions.  IMPRESSION: Mild cardiomegaly.  No edema or consolidation.   Electronically Signed   By: Lowella Grip III M.D.   On: 07/13/2014 08:03  All radiology studies independently viewed by me.      EKG Interpretation   Date/Time:  Sunday July 13 2014 08:02:23 EDT Ventricular Rate:  92 PR Interval:  133 QRS Duration: 144 QT Interval:  401 QTC Calculation: 496 R Axis:   -53 Text Interpretation:  Sinus rhythm RBBB and LAFB No significant change was  found Confirmed by [facility]  MD, TREY ([id number]) on 07/13/2014 9:14:11 AM      MDM   Final diagnoses:  Pain  Pain of lower extremity, unspecified laterality  Left arm pain    74 yo female who presents with what she describes as a lupus flare.  She has pain all over, particularly in legs and left arm.  Her extremities  are NV intact with good pulses, strength, and sensation.  Also  tender to touch in chest and back.  Plan screening labs, pain control, reassess.    Pain improved after a few doses of IV pain medication.  Labs are unremarkable.  She was able to get up and ambulate at her baseline.  She appears to be stable for discharge with outpatient follow up.  Given return precautions.   Serita Grit, MD 07/13/14 1556

## 2014-09-01 ENCOUNTER — Emergency Department (HOSPITAL_COMMUNITY): Payer: Medicare HMO

## 2014-09-01 ENCOUNTER — Emergency Department (HOSPITAL_COMMUNITY)
Admission: EM | Admit: 2014-09-01 | Discharge: 2014-09-01 | Disposition: A | Discharge status: Home / Self Care | Payer: Medicare HMO | Attending: Emergency Medicine | Admitting: Emergency Medicine

## 2014-09-01 ENCOUNTER — Encounter (HOSPITAL_COMMUNITY): Payer: Self-pay | Admitting: Emergency Medicine

## 2014-09-01 DIAGNOSIS — I509 Heart failure, unspecified: Secondary | ICD-10-CM | POA: Diagnosis not present

## 2014-09-01 DIAGNOSIS — N289 Disorder of kidney and ureter, unspecified: Secondary | ICD-10-CM | POA: Diagnosis not present

## 2014-09-01 DIAGNOSIS — Z791 Long term (current) use of non-steroidal anti-inflammatories (NSAID): Secondary | ICD-10-CM | POA: Insufficient documentation

## 2014-09-01 DIAGNOSIS — R0789 Other chest pain: Secondary | ICD-10-CM | POA: Diagnosis not present

## 2014-09-01 DIAGNOSIS — I10 Essential (primary) hypertension: Secondary | ICD-10-CM | POA: Diagnosis not present

## 2014-09-01 DIAGNOSIS — E669 Obesity, unspecified: Secondary | ICD-10-CM | POA: Insufficient documentation

## 2014-09-01 DIAGNOSIS — I872 Venous insufficiency (chronic) (peripheral): Secondary | ICD-10-CM | POA: Diagnosis not present

## 2014-09-01 DIAGNOSIS — M5489 Other dorsalgia: Secondary | ICD-10-CM

## 2014-09-01 DIAGNOSIS — Z87891 Personal history of nicotine dependence: Secondary | ICD-10-CM | POA: Insufficient documentation

## 2014-09-01 DIAGNOSIS — E119 Type 2 diabetes mellitus without complications: Secondary | ICD-10-CM | POA: Insufficient documentation

## 2014-09-01 DIAGNOSIS — Z79899 Other long term (current) drug therapy: Secondary | ICD-10-CM | POA: Insufficient documentation

## 2014-09-01 DIAGNOSIS — Z794 Long term (current) use of insulin: Secondary | ICD-10-CM | POA: Insufficient documentation

## 2014-09-01 DIAGNOSIS — M329 Systemic lupus erythematosus, unspecified: Secondary | ICD-10-CM | POA: Insufficient documentation

## 2014-09-01 DIAGNOSIS — Z853 Personal history of malignant neoplasm of breast: Secondary | ICD-10-CM | POA: Insufficient documentation

## 2014-09-01 DIAGNOSIS — Z86711 Personal history of pulmonary embolism: Secondary | ICD-10-CM | POA: Diagnosis not present

## 2014-09-01 DIAGNOSIS — Z880 Allergy status to penicillin: Secondary | ICD-10-CM | POA: Insufficient documentation

## 2014-09-01 DIAGNOSIS — G4730 Sleep apnea, unspecified: Secondary | ICD-10-CM | POA: Insufficient documentation

## 2014-09-01 DIAGNOSIS — Z9981 Dependence on supplemental oxygen: Secondary | ICD-10-CM | POA: Diagnosis not present

## 2014-09-01 DIAGNOSIS — R079 Chest pain, unspecified: Secondary | ICD-10-CM

## 2014-09-01 DIAGNOSIS — Z7952 Long term (current) use of systemic steroids: Secondary | ICD-10-CM | POA: Insufficient documentation

## 2014-09-01 DIAGNOSIS — M546 Pain in thoracic spine: Secondary | ICD-10-CM | POA: Diagnosis not present

## 2014-09-01 DIAGNOSIS — J449 Chronic obstructive pulmonary disease, unspecified: Secondary | ICD-10-CM | POA: Insufficient documentation

## 2014-09-01 DIAGNOSIS — Z7982 Long term (current) use of aspirin: Secondary | ICD-10-CM | POA: Insufficient documentation

## 2014-09-01 DIAGNOSIS — E785 Hyperlipidemia, unspecified: Secondary | ICD-10-CM | POA: Insufficient documentation

## 2014-09-01 LAB — CBC
HCT: 34.5 % — ABNORMAL LOW (ref 36.0–46.0)
Hemoglobin: 10.8 g/dL — ABNORMAL LOW (ref 12.0–15.0)
MCH: 27.8 pg (ref 26.0–34.0)
MCHC: 31.3 g/dL (ref 30.0–36.0)
MCV: 88.7 fL (ref 78.0–100.0)
Platelets: 273 10*3/uL (ref 150–400)
RBC: 3.89 MIL/uL (ref 3.87–5.11)
RDW: 16.1 % — ABNORMAL HIGH (ref 11.5–15.5)
WBC: 9.6 10*3/uL (ref 4.0–10.5)

## 2014-09-01 LAB — DIFFERENTIAL
Basophils Absolute: 0 10*3/uL (ref 0.0–0.1)
Basophils Relative: 0 % (ref 0–1)
Eosinophils Absolute: 0.2 10*3/uL (ref 0.0–0.7)
Eosinophils Relative: 3 % (ref 0–5)
Lymphocytes Relative: 23 % (ref 12–46)
Lymphs Abs: 2.1 10*3/uL (ref 0.7–4.0)
MONO ABS: 0.7 10*3/uL (ref 0.1–1.0)
MONOS PCT: 8 % (ref 3–12)
NEUTROS ABS: 6.1 10*3/uL (ref 1.7–7.7)
Neutrophils Relative %: 66 % (ref 43–77)

## 2014-09-01 LAB — D-DIMER, QUANTITATIVE: D-Dimer, Quant: <0.27 ug/mL-FEU (ref 0.00–0.48)

## 2014-09-01 LAB — BASIC METABOLIC PANEL
Anion gap: 13 (ref 5–15)
BUN: 39 mg/dL — ABNORMAL HIGH (ref 6–20)
CALCIUM: 9.6 mg/dL (ref 8.9–10.3)
CO2: 27 mmol/L (ref 22–32)
CREATININE: 2.38 mg/dL — AB (ref 0.44–1.00)
Chloride: 100 mmol/L — ABNORMAL LOW (ref 101–111)
GFR calc Af Amer: 22 mL/min — ABNORMAL LOW (ref >60)
GFR calc non Af Amer: 19 mL/min — ABNORMAL LOW (ref >60)
GLUCOSE: 140 mg/dL — AB (ref 65–99)
Potassium: 4 mmol/L (ref 3.5–5.1)
Sodium: 140 mmol/L (ref 135–145)

## 2014-09-01 LAB — I-STAT TROPONIN, ED: Troponin i, poc: 0 ng/mL (ref 0.00–0.08)

## 2014-09-01 LAB — BRAIN NATRIURETIC PEPTIDE: B NATRIURETIC PEPTIDE 5: 35.4 pg/mL (ref 0.0–100.0)

## 2014-09-01 MED ORDER — PREDNISONE 50 MG PO TABS: 50.0000 mg | ORAL_TABLET | Freq: Every day | ORAL | Status: DC

## 2014-09-01 MED ORDER — HYDROMORPHONE HCL 1 MG/ML IJ SOLN
1.0000 mg | Freq: Once | INTRAMUSCULAR | Status: AC
  Administered 2014-09-01: 1 mg via INTRAVENOUS
  Filled 2014-09-01: qty 1

## 2014-09-01 MED ORDER — ONDANSETRON HCL 4 MG/2ML IJ SOLN
4.0000 mg | Freq: Once | INTRAMUSCULAR | Status: AC
  Administered 2014-09-01: 4 mg via INTRAVENOUS
  Filled 2014-09-01: qty 2

## 2014-09-01 MED ORDER — METHYLPREDNISOLONE SODIUM SUCC 125 MG IJ SOLR
125.0000 mg | Freq: Once | INTRAMUSCULAR | Status: AC
  Administered 2014-09-01: 125 mg via INTRAVENOUS
  Filled 2014-09-01: qty 2

## 2014-09-01 MED ORDER — KETOROLAC TROMETHAMINE 30 MG/ML IJ SOLN
30.0000 mg | Freq: Once | INTRAMUSCULAR | Status: AC
  Administered 2014-09-01: 30 mg via INTRAVENOUS
  Filled 2014-09-01: qty 1

## 2014-09-01 NOTE — ED Notes (Signed)
Awaiting arrival of daughter in law then will DC.

## 2014-09-01 NOTE — ED Notes (Signed)
EKG given to [place] for review

## 2014-09-01 NOTE — ED Notes (Signed)
MD Glick at bedside. 

## 2014-09-01 NOTE — Discharge Instructions (Signed)
Continue taking your medications at home for pain. Follow-up with your primary care provider in the next several days to see if he wants you to taper down the dose of your prednisone.  Chest Pain (Nonspecific) It is often hard to give a specific diagnosis for the cause of chest pain. There is always a chance that your pain could be related to something serious, such as a heart attack or a blood clot in the lungs. You need to follow up with your health care provider for further evaluation. CAUSES   Heartburn.  Pneumonia or bronchitis.  Anxiety or stress.  Inflammation around your heart (pericarditis) or lung (pleuritis or pleurisy).  A blood clot in the lung.  A collapsed lung (pneumothorax). It can develop suddenly on its own (spontaneous pneumothorax) or from trauma to the chest.  Shingles infection (herpes zoster virus). The chest wall is composed of bones, muscles, and cartilage. Any of these can be the source of the pain.  The bones can be bruised by injury.  The muscles or cartilage can be strained by coughing or overwork.  The cartilage can be affected by inflammation and become sore (costochondritis). DIAGNOSIS  Lab tests or other studies may be needed to find the cause of your pain. Your health care provider may have you take a test called an ambulatory electrocardiogram (ECG). An ECG records your heartbeat patterns over a 24-hour period. You may also have other tests, such as:  Transthoracic echocardiogram (TTE). During echocardiography, sound waves are used to evaluate how blood flows through your heart.  Transesophageal echocardiogram (TEE).  Cardiac monitoring. This allows your health care provider to monitor your heart rate and rhythm in real time.  Holter monitor. This is a portable device that records your heartbeat and can help diagnose heart arrhythmias. It allows your health care provider to track your heart activity for several days, if needed.  Stress tests by  exercise or by giving medicine that makes the heart beat faster. TREATMENT   Treatment depends on what may be causing your chest pain. Treatment may include:  Acid blockers for heartburn.  Anti-inflammatory medicine.  Pain medicine for inflammatory conditions.  Antibiotics if an infection is present.  You may be advised to change lifestyle habits. This includes stopping smoking and avoiding alcohol, caffeine, and chocolate.  You may be advised to keep your head raised (elevated) when sleeping. This reduces the chance of acid going backward from your stomach into your esophagus. Most of the time, nonspecific chest pain will improve within 2-3 days with rest and mild pain medicine.  HOME CARE INSTRUCTIONS   If antibiotics were prescribed, take them as directed. Finish them even if you start to feel better.  For the next few days, avoid physical activities that bring on chest pain. Continue physical activities as directed.  Do not use any tobacco products, including cigarettes, chewing tobacco, or electronic cigarettes.  Avoid drinking alcohol.  Only take medicine as directed by your health care provider.  Follow your health care provider's suggestions for further testing if your chest pain does not go away.  Keep any follow-up appointments you made. If you do not go to an appointment, you could develop lasting (chronic) problems with pain. If there is any problem keeping an appointment, call to reschedule. SEEK MEDICAL CARE IF:   Your chest pain does not go away, even after treatment.  You have a rash with blisters on your chest.  You have a fever. SEEK IMMEDIATE MEDICAL CARE IF:  You have increased chest pain or pain that spreads to your arm, neck, jaw, back, or abdomen.  You have shortness of breath.  You have an increasing cough, or you cough up blood.  You have severe back or abdominal pain.  You feel nauseous or vomit.  You have severe weakness.  You  faint.  You have chills. This is an emergency. Do not wait to see if the pain will go away. Get medical help at once. Call your local emergency services (911 in [place] Do not drive yourself to the hospital. MAKE SURE YOU:   Understand these instructions.  Will watch your condition.  Will get help right away if you are not doing well or get worse. Document Released: 01/12/2005 Document Revised: 04/09/2013 Document Reviewed: 11/08/2007 [facility] Patient Information 2015 [place], [place]. This information is not intended to replace advice given to you by your health care provider. Make sure you discuss any questions you have with your health care provider.  Lupus Lupus (also called systemic lupus erythematosus, SLE) is a disorder of the body's natural defense system (immune system). In lupus, the immune system attacks various areas of the body (autoimmune disease). CAUSES The cause is unknown. However, lupus runs in families. Certain genes can make you more likely to develop lupus. It is 10 times more common in women than in men. Lupus is also more common in African Americans and Asians. Other factors also play a role, such as viruses (Epstein-Barr virus, EBV), stress, hormones, cigarette smoke, and certain drugs. SYMPTOMS Lupus can affect many parts of the body, including the joints, skin, kidneys, lungs, heart, nervous system, and blood vessels. The signs and symptoms of lupus differ from person to person. The disease can range from mild to life-threatening. Typical features of lupus include:  Butterfly-shaped rash over the face.  Arthritis involving one or more joints.  Kidney disease.  Fever, weight loss, hair loss, fatigue.  Poor circulation in the fingers and toes (Raynaud's disease).  Chest pain when taking deep breaths. Abdominal pain may also occur.  Skin rash in areas exposed to the sun.  Sores in the mouth and nose. DIAGNOSIS Diagnosing lupus can take a long time and is  often difficult. An exam and an accurate account of your symptoms and health problems is very important. Blood tests are necessary, though no single test can confirm or rule out lupus. Most people with lupus test positive for antinuclear antibodies (ANA) on a blood test. Additional blood tests, a urine test (urinalysis), and sometimes a kidney or skin tissue sample (biopsy) can help to confirm or rule out lupus. TREATMENT There is no cure for lupus. Your caregiver will develop a treatment plan based on your age, sex, health, symptoms, and lifestyle. The goals are to prevent flares, to treat them when they do occur, and to minimize organ damage and complications. How the disease may affect each person varies widely. Most people with lupus can live normal lives, but this disorder must be carefully monitored. Treatment must be adjusted as necessary to prevent serious complications. Medicines used for treatment:  Nonsteroidal anti-inflammatory drugs (NSAIDs) decrease inflammation and can help with chest pain, joint pain, and fevers. Examples include ibuprofen and naproxen.  Antimalarial drugs were designed to treat malaria. They also treat fatigue, joint pain, skin rashes, and inflammation of the lungs in patients with lupus.  Corticosteroids are powerful hormones that rapidly suppress inflammation. The lowest dose with the highest benefit will be chosen. They can be given by cream,  pills, injections, and through the vein (intravenously).  Immunosuppressive drugs block the making of immune cells. They may be used for kidney or nerve disease. HOME CARE INSTRUCTIONS  Exercise. Low-impact activities can usually help keep joints flexible without being too strenuous.  Rest after periods of exercise.  Avoid excessive sun exposure.  Follow proper nutrition and take supplements as recommended by your caregiver.  Stress management can be helpful. SEEK MEDICAL CARE IF:  You have increased fatigue.  You  develop pain.  You develop a rash.  You have an oral temperature above 102 F (38.9 C).  You develop abdominal discomfort.  You develop a headache.  You experience dizziness. [place] [facility] Disorders [facility] Stroke: [employer] of Rheumatology: www.rheumatology.[facility] and Musculoskeletal and Skin Diseases: www.niams.[employer] Document Released: 03/25/2002 Document Revised: 06/27/2011 Document Reviewed: 07/16/2009 [facility] Patient Information 2015 [place], [place]. This information is not intended to replace advice given to you by your health care provider. Make sure you discuss any questions you have with your health care provider.  Prednisone tablets What is this medicine? PREDNISONE (PRED ni sone) is a corticosteroid. It is commonly used to treat inflammation of the skin, joints, lungs, and other organs. Common conditions treated include asthma, allergies, and arthritis. It is also used for other conditions, such as blood disorders and diseases of the adrenal glands. This medicine may be used for other purposes; ask your health care provider or pharmacist if you have questions. COMMON BRAND NAME(S): Deltasone, Predone, Sterapred, Sterapred DS What should I tell my health care provider before I take this medicine? They need to know if you have any of these conditions: -Cushing's syndrome -diabetes -glaucoma -heart disease -high blood pressure -infection (especially a virus infection such as chickenpox, cold sores, or herpes) -kidney disease -liver disease -mental illness -myasthenia gravis -osteoporosis -seizures -stomach or intestine problems -thyroid disease -an unusual or allergic reaction to lactose, prednisone, other medicines, foods, dyes, or preservatives -pregnant or trying to get pregnant -breast-feeding How should I use this medicine? Take this medicine by mouth with a glass of  water. Follow the directions on the prescription label. Take this medicine with food. If you are taking this medicine once a day, take it in the morning. Do not take more medicine than you are told to take. Do not suddenly stop taking your medicine because you may develop a severe reaction. Your doctor will tell you how much medicine to take. If your doctor wants you to stop the medicine, the dose may be slowly lowered over time to avoid any side effects. Talk to your pediatrician regarding the use of this medicine in children. Special care may be needed. Overdosage: If you think you have taken too much of this medicine contact a poison control center or emergency room at once. NOTE: This medicine is only for you. Do not share this medicine with others. What if I miss a dose? If you miss a dose, take it as soon as you can. If it is almost time for your next dose, talk to your doctor or health care professional. You may need to miss a dose or take an extra dose. Do not take double or extra doses without advice. What may interact with this medicine? Do not take this medicine with any of the following medications: -metyrapone -mifepristone This medicine may also interact with the following medications: -aminoglutethimide -amphotericin B -aspirin and aspirin-like medicines -barbiturates -certain medicines for diabetes, like glipizide or  glyburide -cholestyramine -cholinesterase inhibitors -cyclosporine -digoxin -diuretics -ephedrine -female hormones, like estrogens and birth control pills -isoniazid -ketoconazole -NSAIDS, medicines for pain and inflammation, like ibuprofen or naproxen -phenytoin -rifampin -toxoids -vaccines -warfarin This list may not describe all possible interactions. Give your health care provider a list of all the medicines, herbs, non-prescription drugs, or dietary supplements you use. Also tell them if you smoke, drink alcohol, or use illegal drugs. Some items may  interact with your medicine. What should I watch for while using this medicine? Visit your doctor or health care professional for regular checks on your progress. If you are taking this medicine over a prolonged period, carry an identification card with your name and address, the type and dose of your medicine, and your doctor's name and address. This medicine may increase your risk of getting an infection. Tell your doctor or health care professional if you are around anyone with measles or chickenpox, or if you develop sores or blisters that do not heal properly. If you are going to have surgery, tell your doctor or health care professional that you have taken this medicine within the last twelve months. Ask your doctor or health care professional about your diet. You may need to lower the amount of salt you eat. This medicine may affect blood sugar levels. If you have diabetes, check with your doctor or health care professional before you change your diet or the dose of your diabetic medicine. What side effects may I notice from receiving this medicine? Side effects that you should report to your doctor or health care professional as soon as possible: -allergic reactions like skin rash, itching or hives, swelling of the face, lips, or tongue -changes in emotions or moods -changes in vision -depressed mood -eye pain -fever or chills, cough, sore throat, pain or difficulty passing urine -increased thirst -swelling of ankles, feet Side effects that usually do not require medical attention (report to your doctor or health care professional if they continue or are bothersome): -confusion, excitement, restlessness -headache -nausea, vomiting -skin problems, acne, thin and shiny skin -trouble sleeping -weight gain This list may not describe all possible side effects. Call your doctor for medical advice about side effects. You may report side effects to FDA at 1-800-FDA-1088. Where should I keep  my medicine? Keep out of the reach of children. Store at room temperature between 15 and 30 degrees C (59 and 86 degrees F). Protect from light. Keep container tightly closed. Throw away any unused medicine after the expiration date. NOTE: This sheet is a summary. It may not cover all possible information. If you have questions about this medicine, talk to your doctor, pharmacist, or health care provider.  2015, [employer]. (2010-11-18 10:57:14)

## 2014-09-01 NOTE — ED Notes (Addendum)
Pt from home via [facility] c/o chest pain that shoots through back. She reports pain started around 1200 yesterday after church. She states this is her typical lupus pain but it just got worse today.She reports it is worse with  Movement. Shortness of breath noted.

## 2014-09-01 NOTE — ED Notes (Signed)
Bed: [id number] Expected date: 09/01/14 Expected time: 1:00 AM Means of arrival: Ambulance Comments: Lupus pain

## 2014-09-01 NOTE — ED Provider Notes (Signed)
CSN: [id number]     Arrival date & time 09/01/14  0114 History   First MD Initiated Contact with Patient 09/01/14 0129     Chief Complaint  Patient presents with  . Chest Pain  . Back Pain     (Consider location/radiation/quality/duration/timing/severity/associated sxs/prior Treatment) Patient is a 74 y.o. female presenting with chest pain and back pain. The history is provided by the patient.  Chest Pain Associated symptoms: back pain   Back Pain Associated symptoms: chest pain   She complains of chest pain and mid back pain which is typical of her lupus flareups. Pain started about noon and she notes is worse with any movement and with deep breathing. She is taken acetaminophen without any relief. She denies fever, chills, sweats. There has been a plus/minus dyspnea. There is been no nausea or diaphoresis. She denies any cough. She states that when she gets pains like this, she comes to the ED for pain medicine and steroids.  Past Medical History  Diagnosis Date  . COPD (chronic obstructive pulmonary disease)   . Diabetes mellitus without complication   . CHF (congestive heart failure)   . Lupus   . Cancer     breast cancer  . Pulmonary embolism 01/2012     CT showed multi small PE and coumadized   . Sleep apnea 2010    C-PAP machine  . Systemic lupus erythematosus   . Hypertension   . Hyperlipidemia   . Obesity    Past Surgical History  Procedure Laterality Date  . Mastectomy      right side  . Abdominal hysterectomy  1976  . Exploratory laparotomy    . Back surgery    . Doppler echocardiography  01/25/2012    EF 55 TO 60%; LV norm.  [facility] Curia myoview  05/02/2008    EF 67% ; LV norm  . Lower extrem. venous doppler  01/25/2012     neg.   Family History  Problem Relation Age of Onset  . Heart failure Mother   . Heart failure Father   . Diabetes Brother    History  Substance Use Topics  . Smoking status: Former Research scientist (life sciences)  . Smokeless tobacco: Never Used  .  Alcohol Use: No   OB History    No data available     Review of Systems  Cardiovascular: Positive for chest pain.  Musculoskeletal: Positive for back pain.  All other systems reviewed and are negative.     Allergies  Penicillins; Fentanyl; Peach; and Shellfish allergy  Home Medications   Prior to Admission medications   Medication Sig Start Date End Date Taking? Authorizing Provider  acetaminophen (TYLENOL) 650 MG CR tablet Take 650 mg by mouth every 8 (eight) hours as needed for pain.    Historical Provider, MD  albuterol (PROVENTIL HFA;VENTOLIN HFA) 108 (90 BASE) MCG/ACT inhaler Inhale 2 puffs into the lungs every 6 (six) hours as needed for wheezing or shortness of breath.    Historical Provider, MD  albuterol (PROVENTIL) (2.5 MG/3ML) 0.083% nebulizer solution Take 2.5 mg by nebulization every 6 (six) hours as needed for wheezing or shortness of breath.    Historical Provider, MD  alendronate (FOSAMAX) 70 MG tablet Take 70 mg by mouth every Monday. Take with a full glass of water on an empty stomach.    Historical Provider, MD  aspirin 325 MG tablet Take 325 mg by mouth daily.    Historical Provider, MD  atenolol (TENORMIN) 100 MG tablet Take 100  mg by mouth daily.     Historical Provider, MD  budesonide (PULMICORT) 0.5 MG/2ML nebulizer solution Take 0.5 mg by nebulization every 6 (six) hours as needed (wheezing).    Historical Provider, MD  carbamide peroxide (DEBROX) 6.5 % otic solution Place 5 drops into both ears daily.    Historical Provider, MD  cholecalciferol (VITAMIN D) 1000 UNITS tablet Take 1,000 Units by mouth daily.    Historical Provider, MD  clobetasol ointment (TEMOVATE) [id number] % Apply 1 application topically daily.     Historical Provider, MD  desonide (DESOWEN) 0.05 % cream Apply 1 application topically daily.     Historical Provider, MD  dexlansoprazole (DEXILANT) 60 MG capsule Take 60 mg by mouth 2 (two) times daily.    Historical Provider, MD  diclofenac sodium  (VOLTAREN) 1 % GEL Apply 1 application topically daily.     Historical Provider, MD  diltiazem (CARTIA XT) 300 MG 24 hr capsule Take 300 mg by mouth daily.    Historical Provider, MD  diphenhydrAMINE (BENADRYL) 25 MG tablet Take 25 mg by mouth every 6 (six) hours as needed for itching.    Historical Provider, MD  furosemide (LASIX) 40 MG tablet Take 40 mg by mouth daily.     Historical Provider, MD  hydrALAZINE (APRESOLINE) 25 MG tablet Take 25 mg by mouth 3 (three) times daily.    Historical Provider, MD  HYDROcodone-acetaminophen (NORCO/VICODIN) 5-325 MG per tablet Take 1 tablet by mouth every 6 (six) hours as needed for moderate pain. Patient not taking: Reported on 07/13/2014 12/30/13   Oswald Hillock, MD  hydroxychloroquine (PLAQUENIL) 200 MG tablet Take 200 mg by mouth 2 (two) times daily.    Historical Provider, MD  insulin aspart (NOVOLOG) 100 UNIT/ML injection Inject 15 Units into the skin 3 (three) times daily before meals.     Historical Provider, MD  insulin glargine (LANTUS) 100 UNIT/ML injection Inject 60 Units into the skin 2 (two) times daily. 50 units in the morning 40 units at bedtime 01/29/12   Ripudeep K Rai, MD  isosorbide mononitrate (IMDUR) 30 MG 24 hr tablet Take 30 mg by mouth every morning.    Historical Provider, MD  ketotifen (ALAWAY) 0.025 % ophthalmic solution Place 1 drop into both eyes 2 (two) times daily.    Historical Provider, MD  latanoprost (XALATAN) 0.005 % ophthalmic solution Place 1 drop into both eyes at bedtime.    Historical Provider, MD  lisinopril-hydrochlorothiazide (PRINZIDE,ZESTORETIC) 20-12.5 MG per tablet Take 1 tablet by mouth daily. 03/08/13   Historical Provider, MD  pravastatin (PRAVACHOL) 40 MG tablet Take 40 mg by mouth at bedtime.     Historical Provider, MD  predniSONE (DELTASONE) 10 MG tablet Take 6 tablets (60 mg total) by mouth daily. 07/14/14   Serita Grit, MD  SYMBICORT 160-4.5 MCG/ACT inhaler Inhale 2 puffs into the lungs 2 (two) times  daily.  04/22/13   Historical Provider, MD  vitamin B-12 (CYANOCOBALAMIN) 1000 MCG tablet Take 1,000 mcg by mouth daily.    Historical Provider, MD   BP 121/61 mmHg  Pulse 76  Temp(Src) 98.7 F (37.1 C) (Oral)  Resp 22  SpO2 99% Physical Exam  Nursing note and vitals reviewed.  Obese 74 year old female, who appears uncomfortable, but is in no acute distress. Vital signs are significant for tachypnea. Oxygen saturation is 98%, which is normal. Head is normocephalic and atraumatic. PERRLA, EOMI. Oropharynx is clear. Neck is nontender and supple without adenopathy or JVD. Back is moderately  tender throughout the lower thoracic and lumbar area. Lungs are clear without rales, wheezes, or rhonchi. Chest moderately tender diffusely. Heart has regular rate and rhythm without murmur. Abdomen is soft, flat, nontender without masses or hepatosplenomegaly and peristalsis is normoactive. Extremities have 1+ edema, full range of motion is present. Moderate venous stasis changes are present. Skin is warm and dry without rash. Neurologic: Mental status is normal, cranial nerves are intact, there are no motor deficits. She has decreased sensation in her feet consistent with peripheral neuropathy.  ED Course  Procedures (including critical care time) Labs Review Results for orders placed or performed during the hospital encounter of [id number]  Basic metabolic panel  Result Value Ref Range   Sodium 140 135 - 145 mmol/L   Potassium 4.0 3.5 - 5.1 mmol/L   Chloride 100 (L) 101 - 111 mmol/L   CO2 27 22 - 32 mmol/L   Glucose, Bld 140 (H) 65 - 99 mg/dL   BUN 39 (H) 6 - 20 mg/dL   Creatinine, Ser 2.38 (H) 0.44 - 1.00 mg/dL   Calcium 9.6 8.9 - 10.3 mg/dL   GFR calc non Af Amer 19 (L) >60 mL/min   GFR calc Af Amer 22 (L) >60 mL/min   Anion gap 13 5 - 15  CBC  Result Value Ref Range   WBC 9.6 4.0 - 10.5 K/uL   RBC 3.89 3.87 - 5.11 MIL/uL   Hemoglobin 10.8 (L) 12.0 - 15.0 g/dL   HCT 34.5 (L) 36.0 - 46.0  %   MCV 88.7 78.0 - 100.0 fL   MCH 27.8 26.0 - 34.0 pg   MCHC 31.3 30.0 - 36.0 g/dL   RDW 16.1 (H) 11.5 - 15.5 %   Platelets 273 150 - 400 K/uL  BNP (order ONLY if patient complains of dyspnea/SOB AND you have documented it for THIS visit)  Result Value Ref Range   B Natriuretic Peptide 35.4 0.0 - 100.0 pg/mL  D-dimer, quantitative  Result Value Ref Range   D-Dimer, Quant <0.27 0.00 - 0.48 ug/mL-FEU  Differential  Result Value Ref Range   Neutrophils Relative % 66 43 - 77 %   Neutro Abs 6.1 1.7 - 7.7 K/uL   Lymphocytes Relative 23 12 - 46 %   Lymphs Abs 2.1 0.7 - 4.0 K/uL   Monocytes Relative 8 3 - 12 %   Monocytes Absolute 0.7 0.1 - 1.0 K/uL   Eosinophils Relative 3 0 - 5 %   Eosinophils Absolute 0.2 0.0 - 0.7 K/uL   Basophils Relative 0 0 - 1 %   Basophils Absolute 0.0 0.0 - 0.1 K/uL  I-stat troponin, ED  (not at [facility])  Result Value Ref Range   Troponin i, poc 0.00 0.00 - 0.08 ng/mL   Comment 3           Imaging Review Dg Chest Port 1 View  09/01/2014   CLINICAL DATA:  Acute onset of chest pain, radiating to the back. Shortness of breath. Initial encounter.  EXAM: PORTABLE CHEST - 1 VIEW  COMPARISON:  Chest radiograph performed 07/13/2014  FINDINGS: The lungs are well-aerated. Minimal bibasilar opacities likely reflect atelectasis. There is no evidence of pleural effusion or pneumothorax.  The cardiomediastinal silhouette is borderline normal in size. No acute osseous abnormalities are seen.  IMPRESSION: Minimal bibasilar opacities likely reflect atelectasis. Lungs otherwise clear.   Electronically Signed   By: Garald Balding M.D.   On: 09/01/2014 02:28     EKG Interpretation  Date/Time:  Monday Sep 01 2014 01:21:16 EDT Ventricular Rate:  76 PR Interval:  145 QRS Duration: 142 QT Interval:  448 QTC Calculation: 504 R Axis:   -17 Text Interpretation:  Sinus rhythm Right bundle branch block Left anterior  fasicular block When compared with ECG of 07/13/2014, No  significant change  was found Confirmed by [facility]  MD, Nakeem Murnane ([id number]) on 09/01/2014 1:30:17 AM      MDM   Final diagnoses:  Chest pain, unspecified chest pain type  Midline back pain, unspecified location  Systemic lupus erythematosus  Renal insufficiency    Chest and back pain which are typical of her lupus flares. However, review of old records show she does have history of pulmonary embolism. Not concerning mimic some of her symptoms. She will be screened with d-dimer and she is given dose of methylprednisolone, diphenhydramine, and ketorolac.  She feels much better after above noted treatment. D-dimer is negative. Renal insufficiency is noted, unchanged from baseline. Hemoglobin is also unchanged from baseline. She is discharged with a prescription for prednisone and instructed to follow-up with her PCP. Prednisone prescription was for burst, PCP will need to decide whether she should be put on a taper following the burst.  Delora Fuel, MD [id number]

## 2014-09-12 ENCOUNTER — Other Ambulatory Visit: Payer: Self-pay

## 2014-09-12 ENCOUNTER — Encounter (HOSPITAL_COMMUNITY): Payer: Self-pay | Admitting: Emergency Medicine

## 2014-09-12 ENCOUNTER — Emergency Department (HOSPITAL_COMMUNITY)
Admission: EM | Admit: 2014-09-12 | Discharge: 2014-09-12 | Disposition: A | Discharge status: Home / Self Care | Payer: Medicare HMO | Attending: Emergency Medicine | Admitting: Emergency Medicine

## 2014-09-12 DIAGNOSIS — Z7982 Long term (current) use of aspirin: Secondary | ICD-10-CM | POA: Insufficient documentation

## 2014-09-12 DIAGNOSIS — Z794 Long term (current) use of insulin: Secondary | ICD-10-CM | POA: Insufficient documentation

## 2014-09-12 DIAGNOSIS — Z86711 Personal history of pulmonary embolism: Secondary | ICD-10-CM | POA: Insufficient documentation

## 2014-09-12 DIAGNOSIS — I10 Essential (primary) hypertension: Secondary | ICD-10-CM | POA: Insufficient documentation

## 2014-09-12 DIAGNOSIS — Z880 Allergy status to penicillin: Secondary | ICD-10-CM | POA: Diagnosis not present

## 2014-09-12 DIAGNOSIS — M329 Systemic lupus erythematosus, unspecified: Secondary | ICD-10-CM | POA: Diagnosis not present

## 2014-09-12 DIAGNOSIS — Z7952 Long term (current) use of systemic steroids: Secondary | ICD-10-CM | POA: Diagnosis not present

## 2014-09-12 DIAGNOSIS — I509 Heart failure, unspecified: Secondary | ICD-10-CM | POA: Insufficient documentation

## 2014-09-12 DIAGNOSIS — E669 Obesity, unspecified: Secondary | ICD-10-CM | POA: Diagnosis not present

## 2014-09-12 DIAGNOSIS — Z791 Long term (current) use of non-steroidal anti-inflammatories (NSAID): Secondary | ICD-10-CM | POA: Insufficient documentation

## 2014-09-12 DIAGNOSIS — E119 Type 2 diabetes mellitus without complications: Secondary | ICD-10-CM | POA: Diagnosis not present

## 2014-09-12 DIAGNOSIS — Z853 Personal history of malignant neoplasm of breast: Secondary | ICD-10-CM | POA: Diagnosis not present

## 2014-09-12 DIAGNOSIS — Z79899 Other long term (current) drug therapy: Secondary | ICD-10-CM | POA: Diagnosis not present

## 2014-09-12 DIAGNOSIS — J449 Chronic obstructive pulmonary disease, unspecified: Secondary | ICD-10-CM | POA: Insufficient documentation

## 2014-09-12 DIAGNOSIS — E785 Hyperlipidemia, unspecified: Secondary | ICD-10-CM | POA: Diagnosis not present

## 2014-09-12 DIAGNOSIS — M25512 Pain in left shoulder: Secondary | ICD-10-CM

## 2014-09-12 DIAGNOSIS — G4730 Sleep apnea, unspecified: Secondary | ICD-10-CM | POA: Diagnosis not present

## 2014-09-12 DIAGNOSIS — Z87891 Personal history of nicotine dependence: Secondary | ICD-10-CM | POA: Insufficient documentation

## 2014-09-12 DIAGNOSIS — Z9981 Dependence on supplemental oxygen: Secondary | ICD-10-CM | POA: Insufficient documentation

## 2014-09-12 LAB — CBC WITH DIFFERENTIAL/PLATELET
BASOS PCT: 0 % (ref 0–1)
Basophils Absolute: 0 10*3/uL (ref 0.0–0.1)
Eosinophils Absolute: 0.1 10*3/uL (ref 0.0–0.7)
Eosinophils Relative: 1 % (ref 0–5)
HCT: 32.6 % — ABNORMAL LOW (ref 36.0–46.0)
HEMOGLOBIN: 10.3 g/dL — AB (ref 12.0–15.0)
Lymphocytes Relative: 20 % (ref 12–46)
Lymphs Abs: 2.1 10*3/uL (ref 0.7–4.0)
MCH: 28.2 pg (ref 26.0–34.0)
MCHC: 31.6 g/dL (ref 30.0–36.0)
MCV: 89.3 fL (ref 78.0–100.0)
MONO ABS: 0.9 10*3/uL (ref 0.1–1.0)
Monocytes Relative: 8 % (ref 3–12)
NEUTROS ABS: 7.3 10*3/uL (ref 1.7–7.7)
NEUTROS PCT: 71 % (ref 43–77)
PLATELETS: 304 10*3/uL (ref 150–400)
RBC: 3.65 MIL/uL — AB (ref 3.87–5.11)
RDW: 16 % — ABNORMAL HIGH (ref 11.5–15.5)
WBC: 10.3 10*3/uL (ref 4.0–10.5)

## 2014-09-12 LAB — I-STAT CHEM 8, ED
BUN: 21 mg/dL — AB (ref 6–20)
CREATININE: 1.8 mg/dL — AB (ref 0.44–1.00)
Calcium, Ion: 1.27 mmol/L (ref 1.13–1.30)
Chloride: 106 mmol/L (ref 101–111)
Glucose, Bld: 74 mg/dL (ref 65–99)
HCT: 33 % — ABNORMAL LOW (ref 36.0–46.0)
HEMOGLOBIN: 11.2 g/dL — AB (ref 12.0–15.0)
POTASSIUM: 4.2 mmol/L (ref 3.5–5.1)
Sodium: 142 mmol/L (ref 135–145)
TCO2: 24 mmol/L (ref 0–100)

## 2014-09-12 LAB — I-STAT TROPONIN, ED: TROPONIN I, POC: 0.01 ng/mL (ref 0.00–0.08)

## 2014-09-12 MED ORDER — HYDROMORPHONE HCL 1 MG/ML IJ SOLN
1.0000 mg | Freq: Once | INTRAMUSCULAR | Status: AC
  Administered 2014-09-12: 1 mg via INTRAVENOUS
  Filled 2014-09-12: qty 1

## 2014-09-12 MED ORDER — PREDNISONE 10 MG PO TABS: ORAL_TABLET | ORAL | Status: DC

## 2014-09-12 MED ORDER — METHYLPREDNISOLONE SODIUM SUCC 125 MG IJ SOLR
125.0000 mg | Freq: Once | INTRAMUSCULAR | Status: AC
  Administered 2014-09-12: 125 mg via INTRAVENOUS
  Filled 2014-09-12: qty 2

## 2014-09-12 MED ORDER — ONDANSETRON HCL 4 MG/2ML IJ SOLN
4.0000 mg | Freq: Once | INTRAMUSCULAR | Status: AC
  Administered 2014-09-12: 4 mg via INTRAVENOUS
  Filled 2014-09-12: qty 2

## 2014-09-12 NOTE — ED Notes (Signed)
Bed: WA18 Expected date:  Expected time:  Means of arrival:  Comments: 

## 2014-09-12 NOTE — ED Notes (Addendum)
Pt reports L shoulder pain that started yesterday. Pt sts that she has been taking Tylenol at home with no relief. Pt denies injury. Pt is A&O and in NAD. Pt reports a 20 out of 10 pain level

## 2014-09-12 NOTE — ED Notes (Addendum)
Pt from home via EMS-Per EMS pt c/o L shoulder pain x 1day. Pt denies injury. Pt has hx of lupus and arthritis and sts that she usually has pain in her joints. Pt decided to call for pain relief today. Pt sts that she believes the reason for the pain is a lupus flare. Pt is A&O and in NAD

## 2014-09-12 NOTE — Discharge Instructions (Signed)
Your left shoulder pain is likely related to lupus flare.  Please take steroid as prescribed and follow up closely with your doctor for further management of your condition.    Lupus Lupus (also called systemic lupus erythematosus, SLE) is a disorder of the body's natural defense system (immune system). In lupus, the immune system attacks various areas of the body (autoimmune disease). CAUSES The cause is unknown. However, lupus runs in families. Certain genes can make you more likely to develop lupus. It is 10 times more common in women than in men. Lupus is also more common in African Americans and Asians. Other factors also play a role, such as viruses (Epstein-Barr virus, EBV), stress, hormones, cigarette smoke, and certain drugs. SYMPTOMS Lupus can affect many parts of the body, including the joints, skin, kidneys, lungs, heart, nervous system, and blood vessels. The signs and symptoms of lupus differ from person to person. The disease can range from mild to life-threatening. Typical features of lupus include:  Butterfly-shaped rash over the face.  Arthritis involving one or more joints.  Kidney disease.  Fever, weight loss, hair loss, fatigue.  Poor circulation in the fingers and toes (Raynaud's disease).  Chest pain when taking deep breaths. Abdominal pain may also occur.  Skin rash in areas exposed to the sun.  Sores in the mouth and nose. DIAGNOSIS Diagnosing lupus can take a long time and is often difficult. An exam and an accurate account of your symptoms and health problems is very important. Blood tests are necessary, though no single test can confirm or rule out lupus. Most people with lupus test positive for antinuclear antibodies (ANA) on a blood test. Additional blood tests, a urine test (urinalysis), and sometimes a kidney or skin tissue sample (biopsy) can help to confirm or rule out lupus. TREATMENT There is no cure for lupus. Your caregiver will develop a treatment  plan based on your age, sex, health, symptoms, and lifestyle. The goals are to prevent flares, to treat them when they do occur, and to minimize organ damage and complications. How the disease may affect each person varies widely. Most people with lupus can live normal lives, but this disorder must be carefully monitored. Treatment must be adjusted as necessary to prevent serious complications. Medicines used for treatment:  Nonsteroidal anti-inflammatory drugs (NSAIDs) decrease inflammation and can help with chest pain, joint pain, and fevers. Examples include ibuprofen and naproxen.  Antimalarial drugs were designed to treat malaria. They also treat fatigue, joint pain, skin rashes, and inflammation of the lungs in patients with lupus.  Corticosteroids are powerful hormones that rapidly suppress inflammation. The lowest dose with the highest benefit will be chosen. They can be given by cream, pills, injections, and through the vein (intravenously).  Immunosuppressive drugs block the making of immune cells. They may be used for kidney or nerve disease. HOME CARE INSTRUCTIONS  Exercise. Low-impact activities can usually help keep joints flexible without being too strenuous.  Rest after periods of exercise.  Avoid excessive sun exposure.  Follow proper nutrition and take supplements as recommended by your caregiver.  Stress management can be helpful. SEEK MEDICAL CARE IF:  You have increased fatigue.  You develop pain.  You develop a rash.  You have an oral temperature above 102 F (38.9 C).  You develop abdominal discomfort.  You develop a headache.  You experience dizziness. [place] of Neurological Disorders and Stroke: [employer] of Rheumatology: www.rheumatology.[employer] of Arthritis and Musculoskeletal and  Skin Diseases: www.niams.[employer].es Document Released: 03/25/2002 Document Revised: 06/27/2011  Document Reviewed: 07/16/2009 [facility] Patient Information 2015 [place], [place]. This information is not intended to replace advice given to you by your health care provider. Make sure you discuss any questions you have with your health care provider.

## 2014-09-12 NOTE — ED Notes (Signed)
Patient is alert and oriented x3.  She was given DC instructions and follow up visit instructions.  Patient gave verbal understanding. She was DC ambulatory under her own power to home.  V/S stable.  He was not showing any signs of distress on DC 

## 2014-09-12 NOTE — ED Provider Notes (Signed)
CSN: [id number]     Arrival date & time 09/12/14  1844 History   First MD Initiated Contact with Patient 09/12/14 1909     Chief Complaint  Patient presents with  . Shoulder Pain     (Consider location/radiation/quality/duration/timing/severity/associated sxs/prior Treatment) HPI   74 year old female with history of SLE, CHF, diabetes, COPD breast cancer status post mastectomy, PE  brought here via EMS from home for evaluation of left shoulder pain. Patient reports gradual onset of left shoulder pain which started yesterday and has been persistent throughout the day. Her pain is described as a sharp and stabbing sensation, nonradiating, worsening with movement. She rates pain as 10 out of 10. She states "this pain is related to my lupus flare, I know". Pain is unrelieved despite using over-the-counter medication. She also has a rash to her left lower extremities related to her lupus. She also complaining of migraine headache that she associate with her lupus. Describe headaches as a tightness and pressure sensation which started in the back of head and radiates to the left side of her face with teary eye and facial twitching.  This headache is similar to prior headaches when she has lupus flare. Patient reports she was seen 2 weeks ago for the lupus flare that improves after receiving sterilely treatment. She does have a specialist that she follow-up. She is currently not on any blood thinning medication. She denies any fever, chills, neck stiffness, productive cough, shortness of breath, chest pain, lightheadedness, dizziness, diaphoresis, abdominal pain, nausea vomiting diarrhea.   Past Medical History  Diagnosis Date  . COPD (chronic obstructive pulmonary disease)   . Diabetes mellitus without complication   . CHF (congestive heart failure)   . Lupus   . Cancer     breast cancer  . Pulmonary embolism 01/2012     CT showed multi small PE and coumadized   . Sleep apnea 2010    C-PAP machine   . Systemic lupus erythematosus   . Hypertension   . Hyperlipidemia   . Obesity    Past Surgical History  Procedure Laterality Date  . Mastectomy      right side  . Abdominal hysterectomy  1976  . Exploratory laparotomy    . Back surgery    . Doppler echocardiography  01/25/2012    EF 55 TO 60%; LV norm.  Pryor Curia myoview  05/02/2008    EF 67% ; LV norm  . Lower extrem. venous doppler  01/25/2012     neg.   Family History  Problem Relation Age of Onset  . Heart failure Mother   . Heart failure Father   . Diabetes Brother    History  Substance Use Topics  . Smoking status: Former Research scientist (life sciences)  . Smokeless tobacco: Never Used  . Alcohol Use: No   OB History    No data available     Review of Systems  All other systems reviewed and are negative.     Allergies  Penicillins; Fentanyl; Peach; and Shellfish allergy  Home Medications   Prior to Admission medications   Medication Sig Start Date End Date Taking? Authorizing Provider  acetaminophen (TYLENOL) 650 MG CR tablet Take 650 mg by mouth every 8 (eight) hours as needed for pain.    Historical Provider, MD  albuterol (PROVENTIL HFA;VENTOLIN HFA) 108 (90 BASE) MCG/ACT inhaler Inhale 2 puffs into the lungs every 6 (six) hours as needed for wheezing or shortness of breath.    Historical Provider, MD  albuterol (  PROVENTIL) (2.5 MG/3ML) 0.083% nebulizer solution Take 2.5 mg by nebulization every 6 (six) hours as needed for wheezing or shortness of breath.    Historical Provider, MD  alendronate (FOSAMAX) 70 MG tablet Take 70 mg by mouth every Monday. Take with a full glass of water on an empty stomach.    Historical Provider, MD  aspirin 325 MG tablet Take 325 mg by mouth daily.    Historical Provider, MD  atenolol (TENORMIN) 100 MG tablet Take 100 mg by mouth daily.     Historical Provider, MD  budesonide (PULMICORT) 0.5 MG/2ML nebulizer solution Take 0.5 mg by nebulization every 6 (six) hours as needed (wheezing).     Historical Provider, MD  carbamide peroxide (DEBROX) 6.5 % otic solution Place 5 drops into both ears daily.    Historical Provider, MD  cholecalciferol (VITAMIN D) 1000 UNITS tablet Take 1,000 Units by mouth daily.    Historical Provider, MD  clobetasol ointment (TEMOVATE) [id number] % Apply 1 application topically daily.     Historical Provider, MD  desonide (DESOWEN) 0.05 % cream Apply 1 application topically daily.     Historical Provider, MD  dexlansoprazole (DEXILANT) 60 MG capsule Take 60 mg by mouth 2 (two) times daily.    Historical Provider, MD  diclofenac sodium (VOLTAREN) 1 % GEL Apply 1 application topically daily.     Historical Provider, MD  diltiazem (CARTIA XT) 300 MG 24 hr capsule Take 300 mg by mouth daily.    Historical Provider, MD  diphenhydrAMINE (BENADRYL) 25 MG tablet Take 25 mg by mouth every 6 (six) hours as needed for itching.    Historical Provider, MD  furosemide (LASIX) 40 MG tablet Take 40 mg by mouth daily.     Historical Provider, MD  hydrALAZINE (APRESOLINE) 25 MG tablet Take 25 mg by mouth 3 (three) times daily.    Historical Provider, MD  HYDROcodone-acetaminophen (NORCO/VICODIN) 5-325 MG per tablet Take 1 tablet by mouth every 6 (six) hours as needed for moderate pain. Patient not taking: Reported on 07/13/2014 12/30/13   Oswald Hillock, MD  hydroxychloroquine (PLAQUENIL) 200 MG tablet Take 200 mg by mouth 2 (two) times daily.    Historical Provider, MD  insulin aspart (NOVOLOG) 100 UNIT/ML injection Inject 15 Units into the skin 3 (three) times daily before meals.     Historical Provider, MD  insulin glargine (LANTUS) 100 UNIT/ML injection Inject 40-50 Units into the skin 2 (two) times daily. 50 units in the morning 40 units at bedtime 01/29/12   Ripudeep K Rai, MD  isosorbide mononitrate (IMDUR) 30 MG 24 hr tablet Take 30 mg by mouth every morning.    Historical Provider, MD  ketotifen (ALAWAY) 0.025 % ophthalmic solution Place 1 drop into both eyes 2 (two) times  daily.    Historical Provider, MD  latanoprost (XALATAN) 0.005 % ophthalmic solution Place 1 drop into both eyes at bedtime.    Historical Provider, MD  lisinopril-hydrochlorothiazide (PRINZIDE,ZESTORETIC) 20-12.5 MG per tablet Take 1 tablet by mouth daily. 03/08/13   Historical Provider, MD  pravastatin (PRAVACHOL) 40 MG tablet Take 40 mg by mouth at bedtime.     Historical Provider, MD  predniSONE (DELTASONE) 50 MG tablet Take 1 tablet (50 mg total) by mouth daily. [id number]   Delora Fuel, MD  SYMBICORT 123XX123 MCG/ACT inhaler Inhale 2 puffs into the lungs 2 (two) times daily.  04/22/13   Historical Provider, MD  vitamin B-12 (CYANOCOBALAMIN) 1000 MCG tablet Take 1,000 mcg by mouth daily.  Historical Provider, MD   BP 144/63 mmHg  Pulse 90  Temp(Src) 98.8 F (37.1 C) (Oral)  Resp 20  SpO2 96% Physical Exam  Constitutional: She appears well-developed and well-nourished. No distress (Moderately obese African-American female, appears uncomfortable.).  HENT:  Head: Atraumatic.  Mouth/Throat: Oropharynx is clear and moist.  Eyes: Conjunctivae are normal.  Neck: Neck supple.  Cardiovascular: Normal rate, regular rhythm and intact distal pulses.   Pulmonary/Chest: Effort normal and breath sounds normal.  Abdominal: Soft. There is no tenderness.  Musculoskeletal: She exhibits tenderness (Diffuse tenderness throughout left shoulder even with soft palpation. Decreased range of motion secondary to pain. No gross deformity and no rash.).  Neurological: She is alert.  Skin: No rash (Erythematous rash noted to left lower extremities anteriorly no evidence of abscess.) noted.  Psychiatric: She has a normal mood and affect.  Nursing note and vitals reviewed.   ED Course  Procedures (including critical care time)   patient with a significant history of lupus here with left shoulder pain which she is convinced it is related to her lupus flare. No evidence of injury noted. She is neurovascularly  intact. I do think that her pain is related to her lupus given her recurrent symptoms and prior workup for similar complaint that shows no acute finding. Patient likely will benefit from steroid, pain medication and monitoring. We'll perform some basic screening exam. She has no shortness of breath concerning for PE.  8:47 PM Her labs are reassuring. Evidence of renal insufficiency however improved from prior. Suspect her pain is lupus related.  She was seen less than 2 weeks ago with similar pain.  She has a normal cardiac work up and a negative D-dimer.  I felt her pain is more likely lupus flare and less likely cardiopulmonary etiology.  I discussed with Dr. Mingo Amber who agrees.  Pt did received pain medication and steroid in ER which has helped.  Plan to d/c with a short course of steroid and strong recommendation to f/u closely with her Lupus specialist for further care.  Strict return precaution given.    Labs Review Labs Reviewed  CBC WITH DIFFERENTIAL/PLATELET - Abnormal; Notable for the following:    RBC 3.65 (*)    Hemoglobin 10.3 (*)    HCT 32.6 (*)    RDW 16.0 (*)    All other components within normal limits  I-STAT CHEM 8, ED - Abnormal; Notable for the following:    BUN 21 (*)    Creatinine, Ser 1.80 (*)    Hemoglobin 11.2 (*)    HCT 33.0 (*)    All other components within normal limits  I-STAT TROPOININ, ED    Imaging Review No results found.   EKG Interpretation None      Date: 09/12/2014  Rate: 89  Rhythm: normal sinus rhythm  QRS Axis: normal  Intervals: normal  ST/T Wave abnormalities: normal  Conduction Disutrbances: RBBB  Narrative Interpretation:   Old EKG Reviewed: No significant changes noted      MDM   Final diagnoses:  Left shoulder pain  Exacerbation of systemic lupus    BP 144/63 mmHg  Pulse 90  Temp(Src) 98.8 F (37.1 C) (Oral)  Resp 20  SpO2 96%     Domenic Moras, PA-C 09/12/14 2145  Evelina Bucy, MD 09/12/14 [phone]

## 2014-10-09 ENCOUNTER — Encounter (HOSPITAL_COMMUNITY): Payer: Self-pay | Admitting: Emergency Medicine

## 2014-10-09 ENCOUNTER — Emergency Department (HOSPITAL_COMMUNITY): Payer: Medicare HMO

## 2014-10-09 ENCOUNTER — Emergency Department (HOSPITAL_COMMUNITY)
Admission: EM | Admit: 2014-10-09 | Discharge: 2014-10-09 | Disposition: A | Discharge status: Home / Self Care | Payer: Medicare HMO | Attending: Emergency Medicine | Admitting: Emergency Medicine

## 2014-10-09 DIAGNOSIS — E669 Obesity, unspecified: Secondary | ICD-10-CM | POA: Diagnosis not present

## 2014-10-09 DIAGNOSIS — M542 Cervicalgia: Secondary | ICD-10-CM | POA: Diagnosis present

## 2014-10-09 DIAGNOSIS — Z794 Long term (current) use of insulin: Secondary | ICD-10-CM | POA: Diagnosis not present

## 2014-10-09 DIAGNOSIS — J449 Chronic obstructive pulmonary disease, unspecified: Secondary | ICD-10-CM | POA: Insufficient documentation

## 2014-10-09 DIAGNOSIS — Z79899 Other long term (current) drug therapy: Secondary | ICD-10-CM | POA: Diagnosis not present

## 2014-10-09 DIAGNOSIS — I509 Heart failure, unspecified: Secondary | ICD-10-CM | POA: Insufficient documentation

## 2014-10-09 DIAGNOSIS — Z853 Personal history of malignant neoplasm of breast: Secondary | ICD-10-CM | POA: Insufficient documentation

## 2014-10-09 DIAGNOSIS — E119 Type 2 diabetes mellitus without complications: Secondary | ICD-10-CM | POA: Insufficient documentation

## 2014-10-09 DIAGNOSIS — Z880 Allergy status to penicillin: Secondary | ICD-10-CM | POA: Diagnosis not present

## 2014-10-09 DIAGNOSIS — I10 Essential (primary) hypertension: Secondary | ICD-10-CM | POA: Diagnosis not present

## 2014-10-09 DIAGNOSIS — Z7952 Long term (current) use of systemic steroids: Secondary | ICD-10-CM | POA: Insufficient documentation

## 2014-10-09 DIAGNOSIS — Z7982 Long term (current) use of aspirin: Secondary | ICD-10-CM | POA: Diagnosis not present

## 2014-10-09 DIAGNOSIS — Z791 Long term (current) use of non-steroidal anti-inflammatories (NSAID): Secondary | ICD-10-CM | POA: Diagnosis not present

## 2014-10-09 DIAGNOSIS — Z87891 Personal history of nicotine dependence: Secondary | ICD-10-CM | POA: Insufficient documentation

## 2014-10-09 DIAGNOSIS — Z9981 Dependence on supplemental oxygen: Secondary | ICD-10-CM | POA: Insufficient documentation

## 2014-10-09 DIAGNOSIS — Z86711 Personal history of pulmonary embolism: Secondary | ICD-10-CM | POA: Diagnosis not present

## 2014-10-09 DIAGNOSIS — G4730 Sleep apnea, unspecified: Secondary | ICD-10-CM | POA: Insufficient documentation

## 2014-10-09 DIAGNOSIS — M329 Systemic lupus erythematosus, unspecified: Secondary | ICD-10-CM | POA: Insufficient documentation

## 2014-10-09 LAB — CBC
HEMATOCRIT: 34 % — AB (ref 36.0–46.0)
HEMOGLOBIN: 10.8 g/dL — AB (ref 12.0–15.0)
MCH: 28.1 pg (ref 26.0–34.0)
MCHC: 31.8 g/dL (ref 30.0–36.0)
MCV: 88.3 fL (ref 78.0–100.0)
Platelets: 327 10*3/uL (ref 150–400)
RBC: 3.85 MIL/uL — ABNORMAL LOW (ref 3.87–5.11)
RDW: 15.7 % — ABNORMAL HIGH (ref 11.5–15.5)
WBC: 10.4 10*3/uL (ref 4.0–10.5)

## 2014-10-09 LAB — BASIC METABOLIC PANEL
Anion gap: 12 (ref 5–15)
BUN: 25 mg/dL — ABNORMAL HIGH (ref 6–20)
CALCIUM: 10.2 mg/dL (ref 8.9–10.3)
CO2: 29 mmol/L (ref 22–32)
Chloride: 101 mmol/L (ref 101–111)
Creatinine, Ser: 1.59 mg/dL — ABNORMAL HIGH (ref 0.44–1.00)
GFR calc Af Amer: 36 mL/min — ABNORMAL LOW (ref >60)
GFR, EST NON AFRICAN AMERICAN: 31 mL/min — AB (ref >60)
Glucose, Bld: 98 mg/dL (ref 65–99)
Potassium: 4 mmol/L (ref 3.5–5.1)
SODIUM: 142 mmol/L (ref 135–145)

## 2014-10-09 LAB — I-STAT TROPONIN, ED: TROPONIN I, POC: 0 ng/mL (ref 0.00–0.08)

## 2014-10-09 MED ORDER — HYDROCODONE-ACETAMINOPHEN 5-325 MG PO TABS: 2.0000 | ORAL_TABLET | ORAL | Status: DC | PRN

## 2014-10-09 MED ORDER — METHYLPREDNISOLONE SODIUM SUCC 125 MG IJ SOLR
125.0000 mg | Freq: Once | INTRAMUSCULAR | Status: AC
  Administered 2014-10-09: 125 mg via INTRAMUSCULAR
  Filled 2014-10-09: qty 2

## 2014-10-09 MED ORDER — ONDANSETRON HCL 4 MG/2ML IJ SOLN
4.0000 mg | Freq: Once | INTRAMUSCULAR | Status: AC
  Administered 2014-10-09: 4 mg via INTRAMUSCULAR
  Filled 2014-10-09: qty 2

## 2014-10-09 MED ORDER — DIPHENHYDRAMINE HCL 25 MG PO CAPS
25.0000 mg | ORAL_CAPSULE | Freq: Once | ORAL | Status: AC
  Administered 2014-10-09: 25 mg via ORAL
  Filled 2014-10-09: qty 1

## 2014-10-09 MED ORDER — PREDNISONE 10 MG PO TABS: ORAL_TABLET | ORAL | Status: DC

## 2014-10-09 MED ORDER — HYDROMORPHONE HCL 2 MG/ML IJ SOLN
2.0000 mg | Freq: Once | INTRAMUSCULAR | Status: AC
Start: 2014-10-09 — End: 2014-10-09
  Administered 2014-10-09: 2 mg via INTRAMUSCULAR
  Filled 2014-10-09: qty 1

## 2014-10-09 NOTE — Discharge Instructions (Signed)
Lupus Lupus (also called systemic lupus erythematosus, SLE) is a disorder of the body's natural defense system (immune system). In lupus, the immune system attacks various areas of the body (autoimmune disease). CAUSES The cause is unknown. However, lupus runs in families. Certain genes can make you more likely to develop lupus. It is 10 times more common in women than in men. Lupus is also more common in African Americans and Asians. Other factors also play a role, such as viruses (Epstein-Barr virus, EBV), stress, hormones, cigarette smoke, and certain drugs. SYMPTOMS Lupus can affect many parts of the body, including the joints, skin, kidneys, lungs, heart, nervous system, and blood vessels. The signs and symptoms of lupus differ from person to person. The disease can range from mild to life-threatening. Typical features of lupus include:  Butterfly-shaped rash over the face.  Arthritis involving one or more joints.  Kidney disease.  Fever, weight loss, hair loss, fatigue.  Poor circulation in the fingers and toes (Raynaud's disease).  Chest pain when taking deep breaths. Abdominal pain may also occur.  Skin rash in areas exposed to the sun.  Sores in the mouth and nose. DIAGNOSIS Diagnosing lupus can take a long time and is often difficult. An exam and an accurate account of your symptoms and health problems is very important. Blood tests are necessary, though no single test can confirm or rule out lupus. Most people with lupus test positive for antinuclear antibodies (ANA) on a blood test. Additional blood tests, a urine test (urinalysis), and sometimes a kidney or skin tissue sample (biopsy) can help to confirm or rule out lupus. TREATMENT There is no cure for lupus. Your caregiver will develop a treatment plan based on your age, sex, health, symptoms, and lifestyle. The goals are to prevent flares, to treat them when they do occur, and to minimize organ damage and complications. How  the disease may affect each person varies widely. Most people with lupus can live normal lives, but this disorder must be carefully monitored. Treatment must be adjusted as necessary to prevent serious complications. Medicines used for treatment:  Nonsteroidal anti-inflammatory drugs (NSAIDs) decrease inflammation and can help with chest pain, joint pain, and fevers. Examples include ibuprofen and naproxen.  Antimalarial drugs were designed to treat malaria. They also treat fatigue, joint pain, skin rashes, and inflammation of the lungs in patients with lupus.  Corticosteroids are powerful hormones that rapidly suppress inflammation. The lowest dose with the highest benefit will be chosen. They can be given by cream, pills, injections, and through the vein (intravenously).  Immunosuppressive drugs block the making of immune cells. They may be used for kidney or nerve disease. HOME CARE INSTRUCTIONS  Exercise. Low-impact activities can usually help keep joints flexible without being too strenuous.  Rest after periods of exercise.  Avoid excessive sun exposure.  Follow proper nutrition and take supplements as recommended by your caregiver.  Stress management can be helpful. SEEK MEDICAL CARE IF:  You have increased fatigue.  You develop pain.  You develop a rash.  You have an oral temperature above 102 F (38.9 C).  You develop abdominal discomfort.  You develop a headache.  You experience dizziness. FOR MORE INFORMATION National Institute of Neurological Disorders and Stroke: www.ninds.nih.gov American College of Rheumatology: www.rheumatology.org National Institute of Arthritis and Musculoskeletal and Skin Diseases: www.niams.nih.gov Document Released: 03/25/2002 Document Revised: 06/27/2011 Document Reviewed: 07/16/2009 Exit[employer] Patient Information 2015 [employer]. This information is not intended to replace advice given to you by your health   care provider. Make sure  you discuss any questions you have with your health care provider.  

## 2014-10-09 NOTE — ED Provider Notes (Signed)
CSN: [id number]     Arrival date & time 10/09/14  1326 History   First MD Initiated Contact with Patient 10/09/14 1513     Chief Complaint  Patient presents with  . Shoulder Pain  . Neck Pain  . Headache     (Consider location/radiation/quality/duration/timing/severity/associated sxs/prior Treatment) Patient is a 74 y.o. female presenting with shoulder pain, neck pain, and headaches. The history is provided by the patient.  Shoulder Pain Location:  Shoulder Injury: no   Shoulder location:  R shoulder Pain details:    Quality:  Aching   Radiates to:  Does not radiate   Severity:  Moderate   Onset quality:  Gradual   Timing:  Constant Chronicity:  New Dislocation: no   Prior injury to area:  Unable to specify Relieved by:  Nothing Worsened by:  Nothing tried Ineffective treatments:  None tried Associated symptoms: neck pain   Neck Pain Associated symptoms: headaches   Headache Associated symptoms: neck pain     Past Medical History  Diagnosis Date  . COPD (chronic obstructive pulmonary disease)   . Diabetes mellitus without complication   . CHF (congestive heart failure)   . Lupus   . Cancer     breast cancer  . Pulmonary embolism 01/2012     CT showed multi small PE and coumadized   . Sleep apnea 2010    C-PAP machine  . Systemic lupus erythematosus   . Hypertension   . Hyperlipidemia   . Obesity    Past Surgical History  Procedure Laterality Date  . Mastectomy      right side  . Abdominal hysterectomy  1976  . Exploratory laparotomy    . Back surgery    . Doppler echocardiography  01/25/2012    EF 55 TO 60%; LV norm.  Pryor Curia myoview  05/02/2008    EF 67% ; LV norm  . Lower extrem. venous doppler  01/25/2012     neg.   Family History  Problem Relation Age of Onset  . Heart failure Mother   . Heart failure Father   . Diabetes Brother    History  Substance Use Topics  . Smoking status: Former Research scientist (life sciences)  . Smokeless tobacco: Never Used  .  Alcohol Use: No   OB History    No data available     Review of Systems  Musculoskeletal: Positive for neck pain.  Neurological: Positive for headaches.  All other systems reviewed and are negative.     Allergies  Penicillins; Fentanyl; Peach; and Shellfish allergy  Home Medications   Prior to Admission medications   Medication Sig Start Date End Date Taking? Authorizing Provider  acetaminophen (TYLENOL) 650 MG CR tablet Take 1,300 mg by mouth every 8 (eight) hours as needed for pain.    Yes Historical Provider, MD  albuterol (PROVENTIL HFA;VENTOLIN HFA) 108 (90 BASE) MCG/ACT inhaler Inhale 2 puffs into the lungs every 6 (six) hours as needed for wheezing or shortness of breath.    Yes Historical Provider, MD  albuterol (PROVENTIL) (2.5 MG/3ML) 0.083% nebulizer solution Take 2.5 mg by nebulization every 6 (six) hours as needed for wheezing or shortness of breath.    Yes Historical Provider, MD  alendronate (FOSAMAX) 70 MG tablet Take 70 mg by mouth every Monday. Take with a full glass of water on an empty stomach.   Yes Historical Provider, MD  aspirin 325 MG tablet Take 325 mg by mouth daily.   Yes Historical Provider, MD  atenolol (  TENORMIN) 100 MG tablet Take 100 mg by mouth daily.    Yes Historical Provider, MD  budesonide (PULMICORT) 0.5 MG/2ML nebulizer solution Take 0.5 mg by nebulization every 6 (six) hours as needed (For wheezing.).    Yes Historical Provider, MD  carbamide peroxide (DEBROX) 6.5 % otic solution Place 5 drops into both ears daily as needed (clogged ears).    Yes Historical Provider, MD  cholecalciferol (VITAMIN D) 1000 UNITS tablet Take 1,000 Units by mouth daily.   Yes Historical Provider, MD  citalopram (CELEXA) 10 MG tablet Take 10 mg by mouth every evening.   Yes Historical Provider, MD  clobetasol ointment (TEMOVATE) [id number] % Apply 1 application topically daily.    Yes Historical Provider, MD  desonide (DESOWEN) 0.05 % cream Apply 1 application topically  daily.    Yes Historical Provider, MD  dexlansoprazole (DEXILANT) 60 MG capsule Take 60 mg by mouth 2 (two) times daily.   Yes Historical Provider, MD  diclofenac sodium (VOLTAREN) 1 % GEL Apply 1 application topically daily.    Yes Historical Provider, MD  diltiazem (CARTIA XT) 300 MG 24 hr capsule Take 300 mg by mouth daily.   Yes Historical Provider, MD  diphenhydrAMINE (BENADRYL) 25 MG tablet Take 25 mg by mouth every 6 (six) hours as needed for itching.    Yes Historical Provider, MD  furosemide (LASIX) 40 MG tablet Take 40 mg by mouth daily.    Yes Historical Provider, MD  hydrALAZINE (APRESOLINE) 25 MG tablet Take 25 mg by mouth 3 (three) times daily.   Yes Historical Provider, MD  HYDROcodone-acetaminophen (NORCO/VICODIN) 5-325 MG per tablet Take 1 tablet by mouth every 6 (six) hours as needed for moderate pain. 12/30/13  Yes Oswald Hillock, MD  hydroxychloroquine (PLAQUENIL) 200 MG tablet Take 200 mg by mouth 2 (two) times daily.   Yes Historical Provider, MD  insulin aspart (NOVOLOG) 100 UNIT/ML injection Inject 15 Units into the skin 3 (three) times daily before meals.    Yes Historical Provider, MD  insulin glargine (LANTUS) 100 UNIT/ML injection Inject 40-50 Units into the skin 2 (two) times daily. She takes 50 units in the morning and 40 units at bedtime. 01/29/12  Yes Ripudeep Krystal Eaton, MD  isosorbide mononitrate (IMDUR) 30 MG 24 hr tablet Take 30 mg by mouth every morning.   Yes Historical Provider, MD  ketotifen (ALAWAY) 0.025 % ophthalmic solution Place 1 drop into both eyes 2 (two) times daily.   Yes Historical Provider, MD  latanoprost (XALATAN) 0.005 % ophthalmic solution Place 1 drop into both eyes at bedtime.   Yes Historical Provider, MD  lisinopril-hydrochlorothiazide (PRINZIDE,ZESTORETIC) 20-12.5 MG per tablet Take 1 tablet by mouth daily. 03/08/13  Yes Historical Provider, MD  pravastatin (PRAVACHOL) 40 MG tablet Take 40 mg by mouth at bedtime.    Yes Historical Provider, MD   predniSONE (DELTASONE) 10 MG tablet Take 60mg  PO day 1, 50mg  PO day 1, 40mg  PO day 3, 30mg  PO day 4, 20mg  PO day 5, and 10mg  PO day 6 09/12/14  Yes Domenic Moras, PA-C  SYMBICORT 160-4.5 MCG/ACT inhaler Inhale 2 puffs into the lungs 2 (two) times daily.  04/22/13  Yes Historical Provider, MD  vitamin B-12 (CYANOCOBALAMIN) 1000 MCG tablet Take 1,000 mcg by mouth daily.   Yes Historical Provider, MD   BP 154/84 mmHg  Pulse 103  Temp(Src) 97.8 F (36.6 C) (Oral)  Resp 20  SpO2 92% Physical Exam  Constitutional: She is oriented to person, place, and  time. She appears well-developed and well-nourished.  HENT:  Head: Normocephalic.  Right Ear: External ear normal.  Left Ear: External ear normal.  Nose: Nose normal.  Eyes: Pupils are equal, round, and reactive to light.  Neck: Normal range of motion.  Cardiovascular: Normal rate.   Pulmonary/Chest: Effort normal.  Abdominal: Soft.  Musculoskeletal: She exhibits tenderness.  Pain with palpation of right shoulder,  Pain with movement  Neurological: She is alert and oriented to person, place, and time. She has normal reflexes.  Skin: Skin is warm.  Psychiatric: She has a normal mood and affect.  Nursing note and vitals reviewed.   ED Course  Procedures (including critical care time) Labs Review Labs Reviewed  CBC - Abnormal; Notable for the following:    RBC 3.85 (*)    Hemoglobin 10.8 (*)    HCT 34.0 (*)    RDW 15.7 (*)    All other components within normal limits  BASIC METABOLIC PANEL  I-STAT TROPOININ, ED    Imaging Review Dg Chest 2 View (if Patient Has Fever And/or Copd)  10/09/2014   CLINICAL DATA:  Right shoulder pain. Neck pain. Headache. Shortness of breath.  EXAM: CHEST - 2 VIEW  COMPARISON:  One-view chest x-ray 08/22/2014.  FINDINGS: Heart size is normal. Mild pulmonary vascular congestion is noted. Mild diffuse interstitial coarsening is likely chronic. Minimal atelectasis or scarring is present at the left lung base.  The patient is unable to lift her right arm, degrading the lateral view. Atherosclerotic changes are noted at the aortic arch. Postsurgical changes are noted in the lumbar spine.  IMPRESSION: 1. Mild pulmonary vascular congestion without frank edema. 2. Scarring or more likely atelectasis at the left lung base. 3. Atherosclerosis of the thoracic aorta.   Electronically Signed   By: San Morelle M.D.   On: 10/09/2014 14:33     EKG Interpretation None      MDM  Pt has slight increase in Bun and creat,  Reviewed with previous labs shows no significant change.  Pt given dilaudid and solumedrol.    Pt observed.  Pt reports decreased pain.  Pt reports her lupus flares normally respond to prednisone.  Pt advised to monitor glucose.  Pt given rx for hydrocodone.  She is advised to see her Md for recheck.   Final diagnoses:  Exacerbation of systemic lupus erythematosus        Fransico Meadow, PA-C 10/09/14 2050  Tanna Furry, MD 10/17/14 1352

## 2014-10-09 NOTE — ED Notes (Signed)
Per EMS: Pt from home.  C/o rt sided shoulder, neck, and head pain x 1 day.  Denies injury.  Pt w/ hx of COPD and when pt was ambulating to stretcher, EMS reports a drop in 02 sat to 86%.  Pt can use 02 at home as needed but states that she normally does not need it, except recently, states she has needed it more.

## 2014-11-14 ENCOUNTER — Other Ambulatory Visit: Payer: Self-pay | Admitting: Specialist

## 2014-11-14 DIAGNOSIS — M542 Cervicalgia: Secondary | ICD-10-CM

## 2014-11-27 ENCOUNTER — Ambulatory Visit
Admission: RE | Admit: 2014-11-27 | Discharge: 2014-11-27 | Disposition: A | Discharge status: Home / Self Care | Payer: Medicare HMO | Source: Ambulatory Visit | Attending: Specialist | Admitting: Specialist

## 2014-11-27 DIAGNOSIS — M542 Cervicalgia: Secondary | ICD-10-CM

## 2014-12-08 ENCOUNTER — Ambulatory Visit (INDEPENDENT_AMBULATORY_CARE_PROVIDER_SITE_OTHER): Payer: Medicare HMO | Admitting: Neurology

## 2014-12-08 ENCOUNTER — Encounter: Payer: Self-pay | Admitting: Neurology

## 2014-12-08 VITALS — BP 130/72 | HR 95 | Ht 65.0 in | Wt 268.0 lb

## 2014-12-08 DIAGNOSIS — E1342 Other specified diabetes mellitus with diabetic polyneuropathy: Secondary | ICD-10-CM | POA: Diagnosis not present

## 2014-12-08 DIAGNOSIS — M545 Low back pain: Secondary | ICD-10-CM | POA: Diagnosis not present

## 2014-12-08 DIAGNOSIS — E1142 Type 2 diabetes mellitus with diabetic polyneuropathy: Secondary | ICD-10-CM

## 2014-12-08 DIAGNOSIS — G629 Polyneuropathy, unspecified: Secondary | ICD-10-CM | POA: Diagnosis not present

## 2014-12-08 DIAGNOSIS — R269 Unspecified abnormalities of gait and mobility: Secondary | ICD-10-CM

## 2014-12-08 DIAGNOSIS — R202 Paresthesia of skin: Secondary | ICD-10-CM | POA: Diagnosis not present

## 2014-12-08 MED ORDER — GABAPENTIN 300 MG PO CAPS: 300.0000 mg | ORAL_CAPSULE | Freq: Two times a day (BID) | ORAL | Status: DC

## 2014-12-08 MED ORDER — GABAPENTIN 300 MG PO CAPS: 300.0000 mg | ORAL_CAPSULE | Freq: Three times a day (TID) | ORAL | Status: DC

## 2014-12-08 NOTE — Progress Notes (Signed)
PATIENT: [name] [name] DOB: 03/13/1941  Chief Complaint  Patient presents with  . Pain    She is here for neck, shoulder and low back pain.  She is having difficulty walking and standing due to her pain and weakness.  She uses the aid of a rolling walker.  She has radiating pain to both shoulders and numbness/tingling in arms, hands and fingers (right side worse than left).  She is also experiencing numbness in bilateral feet and toes.     HISTORICAL  [name] [name] is a 74 years old right-handed  female, seen in refer by her primary care physician Dr. Nolene Ebbs for evaluation of  gait difficulty, worsening low back pain    I reviewed summarized her most recent office note and also orthopedic consultation from Dr. Basil Dess in November 11 2014 She has past medical history of asthma, right breast cancer, history of pulmonary emboli, diabetes, diabetic peripheral neuropathy, lupus, congestive heart failure, hyperlipidemia,  She presented with chronic low back pain, neck pain, getting worse since 2015, she also noticed gradual onset gait difficulty which she contributed to her multiple joints pain, whole-body deep achy pain, she also complains of worsening low back pain, radiating pain bilateral lower extremity, worsening bilateral lower extremity paresthesia, occasionally right hand paresthesia  She was evaluated at [facility] Dr. Louanne Skye,  I have personally reviewed MRI of cervical spine August 2016, mild cervical degenerative changes, without significant spinal canal, or foraminal stenosis. Laboratory evaluation in June 2016, hemoglobin 10.8, creatinine 1.5 9  REVIEW OF SYSTEMS: Full 14 system review of systems performed and notable only for swelling in legs, shortness of breath, snoring, feeling hot, feeling cold, increased thirst, flushing, joint pain, joint swelling, aching muscles, cramps, allergy, skin sensitivity, dizziness, snoring, restless leg, anxiety, decreased energy  change in appetite   ALLERGIES: Allergies  Allergen Reactions  . Penicillins Swelling  . Fentanyl Itching  . Peach [Prunus Persica] Hives  . Shellfish Allergy Hives    HOME MEDICATIONS: Current Outpatient Prescriptions  Medication Sig Dispense Refill  . albuterol (PROVENTIL HFA;VENTOLIN HFA) 108 (90 BASE) MCG/ACT inhaler Inhale 2 puffs into the lungs every 6 (six) hours as needed for wheezing or shortness of breath.     Marland Kitchen albuterol (PROVENTIL) (2.5 MG/3ML) 0.083% nebulizer solution Take 2.5 mg by nebulization every 6 (six) hours as needed for wheezing or shortness of breath.     Marland Kitchen alendronate (FOSAMAX) 70 MG tablet Take 70 mg by mouth every Monday. Take with a full glass of water on an empty stomach.    Marland Kitchen aspirin 325 MG tablet Take 325 mg by mouth daily.    Marland Kitchen atenolol (TENORMIN) 100 MG tablet Take 100 mg by mouth daily.     . budesonide (PULMICORT) 0.5 MG/2ML nebulizer solution Take 0.5 mg by nebulization every 6 (six) hours as needed (For wheezing.).     Marland Kitchen carbamide peroxide (DEBROX) 6.5 % otic solution Place 5 drops into both ears daily as needed (clogged ears).     . cholecalciferol (VITAMIN D) 1000 UNITS tablet Take 1,000 Units by mouth daily.    . citalopram (CELEXA) 10 MG tablet Take 10 mg by mouth every evening.    . clobetasol ointment (TEMOVATE) [id number] % Apply 1 application topically daily.     Marland Kitchen desonide (DESOWEN) 0.05 % cream Apply 1 application topically daily.     Marland Kitchen dexlansoprazole (DEXILANT) 60 MG capsule Take 60 mg by mouth 2 (two) times daily.    Marland Kitchen  diclofenac sodium (VOLTAREN) 1 % GEL Apply 1 application topically daily.     Marland Kitchen diltiazem (CARTIA XT) 300 MG 24 hr capsule Take 300 mg by mouth daily.    . diphenhydrAMINE (BENADRYL) 25 MG tablet Take 25 mg by mouth every 6 (six) hours as needed for itching.     . furosemide (LASIX) 40 MG tablet Take 40 mg by mouth daily.     Marland Kitchen gabapentin (NEURONTIN) 300 MG capsule Take 300 mg by mouth at bedtime.    . hydrALAZINE (APRESOLINE)  25 MG tablet Take 25 mg by mouth 3 (three) times daily.    . hydroxychloroquine (PLAQUENIL) 200 MG tablet Take 200 mg by mouth 2 (two) times daily.    . insulin aspart (NOVOLOG) 100 UNIT/ML injection Inject 15 Units into the skin 3 (three) times daily before meals.     . insulin glargine (LANTUS) 100 UNIT/ML injection Inject 40-50 Units into the skin 2 (two) times daily. She takes 50 units in the morning and 40 units at bedtime.    . isosorbide mononitrate (IMDUR) 30 MG 24 hr tablet Take 30 mg by mouth every morning.    Marland Kitchen ketotifen (ALAWAY) 0.025 % ophthalmic solution Place 1 drop into both eyes 2 (two) times daily.    Marland Kitchen latanoprost (XALATAN) 0.005 % ophthalmic solution Place 1 drop into both eyes at bedtime.    Marland Kitchen lisinopril-hydrochlorothiazide (PRINZIDE,ZESTORETIC) 20-12.5 MG per tablet Take 1 tablet by mouth daily.    . OXYGEN Inhale into the lungs. 2L/min - prn during day and every evening.    . pravastatin (PRAVACHOL) 40 MG tablet Take 40 mg by mouth at bedtime.     . SYMBICORT 160-4.5 MCG/ACT inhaler Inhale 2 puffs into the lungs 2 (two) times daily.     . vitamin B-12 (CYANOCOBALAMIN) 1000 MCG tablet Take 1,000 mcg by mouth daily.     No current facility-administered medications for this visit.    PAST MEDICAL HISTORY: Past Medical History  Diagnosis Date  . COPD (chronic obstructive pulmonary disease)   . Diabetes mellitus without complication   . CHF (congestive heart failure)   . Lupus   . Cancer     breast cancer  . Pulmonary embolism 01/2012     CT showed multi small PE and coumadized   . Sleep apnea 2010    C-PAP machine  . Systemic lupus erythematosus   . Hypertension   . Hyperlipidemia   . Obesity   . Neck pain   . Low back pain   . Numbness and tingling   . Asthma   . Eczema   . Degenerative disc disease, lumbar   . Arthritis   . Glaucoma   . Osteopenia   . GERD (gastroesophageal reflux disease)   . Allergic rhinitis   . Anxiety     PAST SURGICAL  HISTORY: Past Surgical History  Procedure Laterality Date  . Mastectomy      right side  . Abdominal hysterectomy  1976  . Exploratory laparotomy    . Back surgery    . Doppler echocardiography  01/25/2012    EF 55 TO 60%; LV norm.  Pryor Curia myoview  05/02/2008    EF 67% ; LV norm  . Lower extrem. venous doppler  01/25/2012     neg.  . Toe surgery      Bunion    FAMILY HISTORY: Family History  Problem Relation Age of Onset  . Heart failure Mother   . Heart failure Father   .  Diabetes Brother     SOCIAL HISTORY:  Social History   Social History  . Marital Status: Single    Spouse Name: N/A  . Number of Children: 5  . Years of Education: 12   Occupational History  . Disabled    Social History Main Topics  . Smoking status: Former Research scientist (life sciences)  . Smokeless tobacco: Never Used     Comment: Quit 1980  . Alcohol Use: No  . Drug Use: No  . Sexual Activity: Not on file   Other Topics Concern  . Not on file   Social History Narrative   Lives at home alone.   Right-handed.   2-4 cups caffeine daily.     PHYSICAL EXAM   Filed Vitals:   12/08/14 1033  BP: 130/72  Pulse: 95  Height: 5\' 5"  (1.651 m)  Weight: 268 lb (121.564 kg)    Not recorded      Body mass index is 44.6 kg/(m^2).  PHYSICAL EXAMNIATION:  Gen: NAD, conversant, well nourised, obese, well groomed                     Cardiovascular: Regular rate rhythm, no peripheral edema, warm, nontender. Eyes: Conjunctivae clear without exudates or hemorrhage Neck: Supple, no carotid bruise. Pulmonary: Clear to auscultation bilaterally   NEUROLOGICAL EXAM:  MENTAL STATUS: Speech:    Speech is normal; fluent and spontaneous with normal comprehension.  Cognition:     Orientation to time, place and person     Normal recent and remote memory     Normal Attention span and concentration     Normal Language, naming, repeating,spontaneous speech     Fund of knowledge   CRANIAL NERVES: CN II: Visual  fields are full to confrontation. Fundoscopic exam is normal with sharp discs and no vascular changes. Pupils are round equal and briskly reactive to light. CN III, IV, VI: extraocular movement are normal. No ptosis. CN V: Facial sensation is intact to pinprick in all 3 divisions bilaterally. Corneal responses are intact.  CN VII: Face is symmetric with normal eye closure and smile. CN VIII: Hearing is normal to rubbing fingers CN IX, X: Palate elevates symmetrically. Phonation is normal. CN XI: Head turning and shoulder shrug are intact CN XII: Tongue is midline with normal movements and no atrophy.  MOTOR: There is no pronator drift of out-stretched arms. Muscle bulk and tone are normal. Muscle strength is normal.  REFLEXES: Reflexes are 1 and symmetric at the biceps, triceps, knees, and absent at ankles. Plantar responses are flexor.  SENSORY: Length dependent decreased to light touch, pinprick,and vibration sense at bilateral toes  COORDINATION: Rapid alternating movements and fine finger movements are intact. There is no dysmetria on finger-to-nose and heel-knee-shin.    GAIT/STANCE: She need assistant to get up from seated position, wide-based, cautious mildly unsteady gait   DIAGNOSTIC DATA (LABS, IMAGING, TESTING) - I reviewed patient records, labs, notes, testing and imaging myself where available.   ASSESSMENT AND PLAN  SALITA SUNDARAM is a 74 y.o. female   Gait difficulty  Multifactorial, combination of her big body habitus, multiple joints pain, diabetic peripheral neuropathy Chronic low back pain radiating pain to bilateral lower extremities  Differentiation diagnosis including lumbar radiculopathy  MRI of lumbar,  EMG nerve conduction study    Marcial Pacas, M.D. Ph.D.  [facility] [address], [place], [place] [address] Ph: [phone] Fax: [phone]  CC: Dr. Nolene Ebbs

## 2014-12-22 ENCOUNTER — Emergency Department (HOSPITAL_COMMUNITY): Payer: Medicare HMO

## 2014-12-22 ENCOUNTER — Encounter (HOSPITAL_COMMUNITY): Payer: Self-pay | Admitting: *Deleted

## 2014-12-22 ENCOUNTER — Emergency Department (HOSPITAL_COMMUNITY)
Admission: EM | Admit: 2014-12-22 | Discharge: 2014-12-22 | Disposition: A | Discharge status: Home / Self Care | Payer: Medicare HMO | Attending: Emergency Medicine | Admitting: Emergency Medicine

## 2014-12-22 DIAGNOSIS — J45909 Unspecified asthma, uncomplicated: Secondary | ICD-10-CM | POA: Diagnosis not present

## 2014-12-22 DIAGNOSIS — Z86711 Personal history of pulmonary embolism: Secondary | ICD-10-CM | POA: Diagnosis not present

## 2014-12-22 DIAGNOSIS — Z7982 Long term (current) use of aspirin: Secondary | ICD-10-CM | POA: Diagnosis not present

## 2014-12-22 DIAGNOSIS — I509 Heart failure, unspecified: Secondary | ICD-10-CM | POA: Diagnosis not present

## 2014-12-22 DIAGNOSIS — Z794 Long term (current) use of insulin: Secondary | ICD-10-CM | POA: Diagnosis not present

## 2014-12-22 DIAGNOSIS — F419 Anxiety disorder, unspecified: Secondary | ICD-10-CM | POA: Diagnosis not present

## 2014-12-22 DIAGNOSIS — E669 Obesity, unspecified: Secondary | ICD-10-CM | POA: Insufficient documentation

## 2014-12-22 DIAGNOSIS — Z853 Personal history of malignant neoplasm of breast: Secondary | ICD-10-CM | POA: Insufficient documentation

## 2014-12-22 DIAGNOSIS — E119 Type 2 diabetes mellitus without complications: Secondary | ICD-10-CM | POA: Diagnosis not present

## 2014-12-22 DIAGNOSIS — R079 Chest pain, unspecified: Secondary | ICD-10-CM | POA: Insufficient documentation

## 2014-12-22 DIAGNOSIS — E785 Hyperlipidemia, unspecified: Secondary | ICD-10-CM | POA: Diagnosis not present

## 2014-12-22 DIAGNOSIS — Z79899 Other long term (current) drug therapy: Secondary | ICD-10-CM | POA: Diagnosis not present

## 2014-12-22 DIAGNOSIS — I10 Essential (primary) hypertension: Secondary | ICD-10-CM | POA: Insufficient documentation

## 2014-12-22 DIAGNOSIS — L03119 Cellulitis of unspecified part of limb: Secondary | ICD-10-CM

## 2014-12-22 DIAGNOSIS — Z880 Allergy status to penicillin: Secondary | ICD-10-CM | POA: Insufficient documentation

## 2014-12-22 DIAGNOSIS — J449 Chronic obstructive pulmonary disease, unspecified: Secondary | ICD-10-CM | POA: Insufficient documentation

## 2014-12-22 DIAGNOSIS — Z87891 Personal history of nicotine dependence: Secondary | ICD-10-CM | POA: Diagnosis not present

## 2014-12-22 DIAGNOSIS — L03113 Cellulitis of right upper limb: Secondary | ICD-10-CM | POA: Insufficient documentation

## 2014-12-22 DIAGNOSIS — Z872 Personal history of diseases of the skin and subcutaneous tissue: Secondary | ICD-10-CM | POA: Diagnosis not present

## 2014-12-22 LAB — CBC
HCT: 31.6 % — ABNORMAL LOW (ref 36.0–46.0)
Hemoglobin: 9.9 g/dL — ABNORMAL LOW (ref 12.0–15.0)
MCH: 27.5 pg (ref 26.0–34.0)
MCHC: 31.3 g/dL (ref 30.0–36.0)
MCV: 87.8 fL (ref 78.0–100.0)
Platelets: 317 K/uL (ref 150–400)
RBC: 3.6 MIL/uL — ABNORMAL LOW (ref 3.87–5.11)
RDW: 15.9 % — ABNORMAL HIGH (ref 11.5–15.5)
WBC: 11.9 K/uL — ABNORMAL HIGH (ref 4.0–10.5)

## 2014-12-22 LAB — D-DIMER, QUANTITATIVE: D-Dimer, Quant: 1.88 ug/mL-FEU — ABNORMAL HIGH (ref 0.00–0.48)

## 2014-12-22 LAB — BASIC METABOLIC PANEL WITH GFR
Anion gap: 10 (ref 5–15)
BUN: 33 mg/dL — ABNORMAL HIGH (ref 6–20)
CO2: 26 mmol/L (ref 22–32)
Calcium: 10.1 mg/dL (ref 8.9–10.3)
Chloride: 104 mmol/L (ref 101–111)
Creatinine, Ser: 1.49 mg/dL — ABNORMAL HIGH (ref 0.44–1.00)
GFR calc Af Amer: 39 mL/min — ABNORMAL LOW
GFR calc non Af Amer: 34 mL/min — ABNORMAL LOW
Glucose, Bld: 165 mg/dL — ABNORMAL HIGH (ref 65–99)
Potassium: 4.4 mmol/L (ref 3.5–5.1)
Sodium: 140 mmol/L (ref 135–145)

## 2014-12-22 LAB — I-STAT TROPONIN, ED: Troponin i, poc: 0 ng/mL (ref 0.00–0.08)

## 2014-12-22 LAB — URIC ACID: Uric Acid, Serum: 10 mg/dL — ABNORMAL HIGH (ref 2.3–6.6)

## 2014-12-22 MED ORDER — HYDROMORPHONE HCL 1 MG/ML IJ SOLN
1.0000 mg | Freq: Once | INTRAMUSCULAR | Status: AC
  Administered 2014-12-22: 1 mg via INTRAVENOUS
  Filled 2014-12-22: qty 1

## 2014-12-22 MED ORDER — DOXYCYCLINE HYCLATE 100 MG PO CAPS: 100.0000 mg | ORAL_CAPSULE | Freq: Two times a day (BID) | ORAL | Status: AC

## 2014-12-22 MED ORDER — CLINDAMYCIN PHOSPHATE 600 MG/50ML IV SOLN
600.0000 mg | Freq: Once | INTRAVENOUS | Status: AC
  Administered 2014-12-22: 600 mg via INTRAVENOUS
  Filled 2014-12-22: qty 50

## 2014-12-22 MED ORDER — SODIUM CHLORIDE 0.9 % IV BOLUS (SEPSIS)
500.0000 mL | Freq: Once | INTRAVENOUS | Status: AC
  Administered 2014-12-22: 500 mL via INTRAVENOUS

## 2014-12-22 MED ORDER — IOHEXOL 350 MG/ML SOLN
100.0000 mL | Freq: Once | INTRAVENOUS | Status: AC | PRN
  Administered 2014-12-22: 80 mL via INTRAVENOUS

## 2014-12-22 NOTE — ED Notes (Signed)
Pt arrives from home c/o left chest pain, arm pain, and back pain that began Sunday and got worst today.

## 2014-12-22 NOTE — Discharge Instructions (Signed)
Please take antibiotics as prescribed, and call your orthopedic surgeon for close follow-up. Return without fail for worsening symptoms, including worsening pain, fevers, worsening swelling/redness involving her elbow, difficulty breathing, or any other symptoms concerning to you.  Cellulitis Cellulitis is an infection of the skin and the tissue under the skin. The infected area is usually red and tender. This happens most often in the arms and lower legs. HOME CARE   Take your antibiotic medicine as told. Finish the medicine even if you start to feel better.  Keep the infected arm or leg raised (elevated).  Put a warm cloth on the area up to 4 times per day.  Only take medicines as told by your doctor.  Keep all doctor visits as told. GET HELP IF:  You see red streaks on the skin coming from the infected area.  Your red area gets bigger or turns a dark color.  Your bone or joint under the infected area is painful after the skin heals.  Your infection comes back in the same area or different area.  You have a puffy (swollen) bump in the infected area.  You have new symptoms.  You have a fever. GET HELP RIGHT AWAY IF:   You feel very sleepy.  You throw up (vomit) or have watery poop (diarrhea).  You feel sick and have muscle aches and pains. MAKE SURE YOU:   Understand these instructions.  Will watch your condition.  Will get help right away if you are not doing well or get worse. Document Released: 09/21/2007 Document Revised: 08/19/2013 Document Reviewed: 06/20/2011 [facility] Patient Information 2015 [place], [place]. This information is not intended to replace advice given to you by your health care provider. Make sure you discuss any questions you have with your health care provider.  Chest Pain (Nonspecific) It is often hard to give a diagnosis for the cause of chest pain. There is always a chance that your pain could be related to something serious, such as a heart  attack or a blood clot in the lungs. You need to follow up with your doctor. HOME CARE  If antibiotic medicine was given, take it as directed by your doctor. Finish the medicine even if you start to feel better.  For the next few days, avoid activities that bring on chest pain. Continue physical activities as told by your doctor.  Do not use any tobacco products. This includes cigarettes, chewing tobacco, and e-cigarettes.  Avoid drinking alcohol.  Only take medicine as told by your doctor.  Follow your doctor's suggestions for more testing if your chest pain does not go away.  Keep all doctor visits you made. GET HELP IF:  Your chest pain does not go away, even after treatment.  You have a rash with blisters on your chest.  You have a fever. GET HELP RIGHT AWAY IF:   You have more pain or pain that spreads to your arm, neck, jaw, back, or belly (abdomen).  You have shortness of breath.  You cough more than usual or cough up blood.  You have very bad back or belly pain.  You feel sick to your stomach (nauseous) or throw up (vomit).  You have very bad weakness.  You pass out (faint).  You have chills. This is an emergency. Do not wait to see if the problems will go away. Call your local emergency services (911 in [place] Do not drive yourself to the hospital. MAKE SURE YOU:   Understand these instructions.  Will watch your condition.  Will get help right away if you are not doing well or get worse. Document Released: 09/21/2007 Document Revised: 04/09/2013 Document Reviewed: 09/21/2007 [facility] Patient Information 2015 [place], [place]. This information is not intended to replace advice given to you by your health care provider. Make sure you discuss any questions you have with your health care provider.

## 2014-12-22 NOTE — ED Notes (Signed)
Pt in CT at this time.

## 2014-12-22 NOTE — ED Provider Notes (Signed)
Please see previous physicians note regarding patient's presenting history and physical, initial ED course, and associated MDM. In short this is a 74 year old female who presents with chest pain and back pain, that is consistent with prior history of such. The patient does have a prior history of PE, not anticoagulated, and has a positive d-dimer prior to sign out. Pending CT PE. The plan for discharge home if a negative CT PE.  Patient also noted to have erythematous left elbow, with tenderness with range of motion. Previous provider I discussed this patient with Dr. Sharol Given, I would recommended a dose of clindamycin and with discharge home on doxycycline with outpatient follow-up.  CT PE negative for for PE or other acute intrathoracic process. She has been stable on home oxygen and pain consistent with chronic chest pain. She is felt appropriate for discharge home. Given course of doxycycline with outpatient follow-up instructions for Dr. Sharol Given.   Forde Dandy, MD 12/23/14 [phone]

## 2014-12-22 NOTE — ED Provider Notes (Signed)
CSN: [id number]     Arrival date & time 12/22/14  1246 History   First MD Initiated Contact with Patient 12/22/14 1254     Chief Complaint  Patient presents with  . Chest Pain     (Consider location/radiation/quality/duration/timing/severity/associated sxs/prior Treatment) HPI Comments: Patient presents with chest pain. She has a history of lupus. She also has chronic pain issues with chronic pain in her neck that radiates down her arms. She says that her neck pain causes her to have pain all over. Today she's complaining of pain in her neck going to her back and her chest which she's had multiple times in the past. In the past it's been felt to be due to lupus flareups. She tells me that it's not due to her lupus flareups but it's due to her chronic neck issues. She takes Neurontin and over-the-counter pain medicines at home. She also has had some increased pain in her left elbow that started yesterday and got worse today. She reports a little bit of shortness of breath. She is on home oxygen at 2 L/m every night and during the day when she needs it. She denies any cough or chest congestion. She denies any fevers or chills. She states her pain in her chest or back and her arms is worse with movement. She denies any recent falls. She does have a past history of PE so I will check a d-dimer as well.   Past Medical History  Diagnosis Date  . COPD (chronic obstructive pulmonary disease)   . Diabetes mellitus without complication   . CHF (congestive heart failure)   . Lupus   . Cancer     breast cancer  . Pulmonary embolism 01/2012     CT showed multi small PE and coumadized   . Sleep apnea 2010    C-PAP machine  . Systemic lupus erythematosus   . Hypertension   . Hyperlipidemia   . Obesity   . Neck pain   . Low back pain   . Numbness and tingling   . Asthma   . Eczema   . Degenerative disc disease, lumbar   . Arthritis   . Glaucoma   . Osteopenia   . GERD (gastroesophageal reflux  disease)   . Allergic rhinitis   . Anxiety    Past Surgical History  Procedure Laterality Date  . Mastectomy      right side  . Abdominal hysterectomy  1976  . Exploratory laparotomy    . Back surgery    . Doppler echocardiography  01/25/2012    EF 55 TO 60%; LV norm.  [facility] Curia myoview  05/02/2008    EF 67% ; LV norm  . Lower extrem. venous doppler  01/25/2012     neg.  . Toe surgery      Bunion   Family History  Problem Relation Age of Onset  . Heart failure Mother   . Heart failure Father   . Diabetes Brother    Social History  Substance Use Topics  . Smoking status: Former Research scientist (life sciences)  . Smokeless tobacco: Never Used     Comment: Quit 1980  . Alcohol Use: No   OB History    No data available     Review of Systems  Constitutional: Negative for fever, chills, diaphoresis and fatigue.  HENT: Negative for congestion, rhinorrhea and sneezing.   Eyes: Negative.   Respiratory: Negative for cough, chest tightness and shortness of breath.   Cardiovascular: Positive for chest  pain. Negative for leg swelling.  Gastrointestinal: Negative for nausea, vomiting, abdominal pain, diarrhea and blood in stool.  Genitourinary: Negative for frequency, hematuria, flank pain and difficulty urinating.  Musculoskeletal: Positive for myalgias, back pain, joint swelling, arthralgias and neck pain.  Skin: Negative for rash.  Neurological: Negative for dizziness, speech difficulty, weakness, numbness and headaches.      Allergies  Penicillins; Fentanyl; Peach; and Shellfish allergy  Home Medications   Prior to Admission medications   Medication Sig Start Date End Date Taking? Authorizing Provider  acetaminophen (TYLENOL) 650 MG CR tablet Take 1,300 mg by mouth every 8 (eight) hours as needed for pain.   Yes Historical Provider, MD  albuterol (PROVENTIL HFA;VENTOLIN HFA) 108 (90 BASE) MCG/ACT inhaler Inhale 2 puffs into the lungs every 6 (six) hours as needed for wheezing or shortness  of breath.    Yes Historical Provider, MD  albuterol (PROVENTIL) (2.5 MG/3ML) 0.083% nebulizer solution Take 2.5 mg by nebulization every 6 (six) hours as needed for wheezing or shortness of breath.    Yes Historical Provider, MD  alendronate (FOSAMAX) 70 MG tablet Take 70 mg by mouth every Monday. Take with a full glass of water on an empty stomach.   Yes Historical Provider, MD  aspirin 325 MG tablet Take 325 mg by mouth daily.   Yes Historical Provider, MD  atenolol (TENORMIN) 100 MG tablet Take 100 mg by mouth daily.    Yes Historical Provider, MD  budesonide (PULMICORT) 0.5 MG/2ML nebulizer solution Take 0.5 mg by nebulization every 6 (six) hours as needed (For wheezing.).    Yes Historical Provider, MD  carbamide peroxide (DEBROX) 6.5 % otic solution Place 5 drops into both ears daily as needed (clogged ears).    Yes Historical Provider, MD  cholecalciferol (VITAMIN D) 1000 UNITS tablet Take 1,000 Units by mouth daily.   Yes Historical Provider, MD  citalopram (CELEXA) 10 MG tablet Take 10 mg by mouth every evening.   Yes Historical Provider, MD  clobetasol ointment (TEMOVATE) [id number] % Apply 1 application topically daily.    Yes Historical Provider, MD  desonide (DESOWEN) 0.05 % cream Apply 1 application topically daily.    Yes Historical Provider, MD  dexlansoprazole (DEXILANT) 60 MG capsule Take 60 mg by mouth 2 (two) times daily.   Yes Historical Provider, MD  diclofenac sodium (VOLTAREN) 1 % GEL Apply 2 g topically daily.    Yes Historical Provider, MD  diltiazem (CARTIA XT) 300 MG 24 hr capsule Take 300 mg by mouth daily.   Yes Historical Provider, MD  diphenhydrAMINE (BENADRYL) 25 MG tablet Take 25 mg by mouth every 6 (six) hours as needed for itching.    Yes Historical Provider, MD  furosemide (LASIX) 40 MG tablet Take 40 mg by mouth daily.    Yes Historical Provider, MD  gabapentin (NEURONTIN) 300 MG capsule Take 1 capsule (300 mg total) by mouth 2 (two) times daily. 12/08/14  Yes Marcial Pacas, MD  hydrALAZINE (APRESOLINE) 25 MG tablet Take 25 mg by mouth 3 (three) times daily.   Yes Historical Provider, MD  hydroxychloroquine (PLAQUENIL) 200 MG tablet Take 200 mg by mouth 2 (two) times daily.   Yes Historical Provider, MD  insulin aspart (NOVOLOG) 100 UNIT/ML injection Inject 15 Units into the skin 3 (three) times daily before meals.    Yes Historical Provider, MD  insulin glargine (LANTUS) 100 UNIT/ML injection Inject 40-50 Units into the skin 2 (two) times daily. She takes 50 units in the  morning and 40 units at bedtime. 01/29/12  Yes Ripudeep Krystal Eaton, MD  isosorbide mononitrate (IMDUR) 30 MG 24 hr tablet Take 30 mg by mouth every morning.   Yes Historical Provider, MD  ketotifen (ALAWAY) 0.025 % ophthalmic solution Place 1 drop into both eyes 2 (two) times daily.   Yes Historical Provider, MD  latanoprost (XALATAN) 0.005 % ophthalmic solution Place 1 drop into both eyes at bedtime.   Yes Historical Provider, MD  lisinopril-hydrochlorothiazide (PRINZIDE,ZESTORETIC) 20-12.5 MG per tablet Take 1 tablet by mouth daily. 03/08/13  Yes Historical Provider, MD  pravastatin (PRAVACHOL) 40 MG tablet Take 40 mg by mouth at bedtime.    Yes Historical Provider, MD  SYMBICORT 160-4.5 MCG/ACT inhaler Inhale 2 puffs into the lungs 2 (two) times daily.  04/22/13  Yes Historical Provider, MD  vitamin B-12 (CYANOCOBALAMIN) 1000 MCG tablet Take 1,000 mcg by mouth daily.   Yes Historical Provider, MD  OXYGEN Inhale into the lungs. 2L/min - prn during day and every evening.    Historical Provider, MD   BP 163/76 mmHg  Pulse 95  Resp 28  Ht 5\' 5"  (1.651 m)  Wt 260 lb (117.935 kg)  BMI 43.27 kg/m2  SpO2 95% Physical Exam  Constitutional: She is oriented to person, place, and time. She appears well-developed and well-nourished.  HENT:  Head: Normocephalic and atraumatic.  Eyes: Pupils are equal, round, and reactive to light.  Neck:  Pain throughout her cervical area and along the musculature of the  upper back bilaterally.  Cardiovascular: Normal rate, regular rhythm and normal heart sounds.   Pulmonary/Chest: Effort normal and breath sounds normal. No respiratory distress. She has no wheezes. She has no rales. She exhibits tenderness.  Positive tenderness on palpation across the upper chest and back no crepitus or deformity  Abdominal: Soft. Bowel sounds are normal. There is no tenderness. There is no rebound and no guarding.  Musculoskeletal: Normal range of motion. She exhibits no edema.  She has some pain on palpation of her right elbow. There is a 3 cm area of erythema over the olecranon. There doesn't appear to be any joint swelling.  Lymphadenopathy:    She has no cervical adenopathy.  Neurological: She is alert and oriented to person, place, and time.  Skin: Skin is warm and dry. No rash noted.  Psychiatric: She has a normal mood and affect.    ED Course  Procedures (including critical care time) Labs Review Results for orders placed or performed during the hospital encounter of [id number]  Basic metabolic panel  Result Value Ref Range   Sodium 140 135 - 145 mmol/L   Potassium 4.4 3.5 - 5.1 mmol/L   Chloride 104 101 - 111 mmol/L   CO2 26 22 - 32 mmol/L   Glucose, Bld 165 (H) 65 - 99 mg/dL   BUN 33 (H) 6 - 20 mg/dL   Creatinine, Ser 1.49 (H) 0.44 - 1.00 mg/dL   Calcium 10.1 8.9 - 10.3 mg/dL   GFR calc non Af Amer 34 (L) >60 mL/min   GFR calc Af Amer 39 (L) >60 mL/min   Anion gap 10 5 - 15  CBC  Result Value Ref Range   WBC 11.9 (H) 4.0 - 10.5 K/uL   RBC 3.60 (L) 3.87 - 5.11 MIL/uL   Hemoglobin 9.9 (L) 12.0 - 15.0 g/dL   HCT 31.6 (L) 36.0 - 46.0 %   MCV 87.8 78.0 - 100.0 fL   MCH 27.5 26.0 - 34.0 pg  MCHC 31.3 30.0 - 36.0 g/dL   RDW 15.9 (H) 11.5 - 15.5 %   Platelets 317 150 - 400 K/uL  D-dimer, quantitative  Result Value Ref Range   D-Dimer, Quant 1.88 (H) 0.00 - 0.48 ug/mL-FEU  I-stat troponin, ED  Result Value Ref Range   Troponin i, poc 0.00 0.00 - 0.08  ng/mL   Comment 3           Dg Chest 2 View  12/22/2014   CLINICAL DATA:  Chest pain. Shortness of breath. Symptoms started yesterday. COPD.  EXAM: CHEST  2 VIEW  COMPARISON:  10/09/2014  FINDINGS: Mild to moderate enlargement of the cardiopericardial silhouette. Linear opacity along the right hemidiaphragm favoring platelike atelectasis.  Motion artifact and body habitus on the lateral projection complicate assessment. There is poor definition of the posterior hemidiaphragms, which can be a sign of lower lobe airspace opacity but which in this case is probably more related to the motion artifact.  Degenerative left glenohumeral arthropathy.  IMPRESSION: 1. Subsegmental atelectasis at the right lung base. 2. Lower lobes and hemidiaphragmatic contours are obscured on the lateral projection due to motion artifact. This reduces negative predictive value for lower lobe retro-diaphragmatic airspace opacity. 3. Mild-to-moderate enlargement of the cardiopericardial silhouette, without edema.   Electronically Signed   By: Van Clines M.D.   On: 12/22/2014 13:27   Dg Elbow Complete Left  12/22/2014   CLINICAL DATA:  Acute left elbow pain for 2 days.  No known injury.  EXAM: LEFT ELBOW - COMPLETE 3+ VIEW  COMPARISON:  None.  FINDINGS: Slight limited secondary to patient's inability to position.  There is no evidence of acute fracture, subluxation or dislocation.  The joint space is unremarkable.  No definite effusion is noted.  IMPRESSION: No definite abnormality but slightly limited secondary to patient's inability to position.   Electronically Signed   By: Margarette Canada M.D.   On: 12/22/2014 14:55   Mr Cervical Spine Wo Contrast  11/27/2014   CLINICAL DATA:  Neck pain.  Bilateral arm weakness  EXAM: MRI CERVICAL SPINE WITHOUT CONTRAST  TECHNIQUE: Multiplanar, multisequence MR imaging of the cervical spine was performed. No intravenous contrast was administered.  COMPARISON:  Cervical radiographs 10/22/2014   FINDINGS: Image quality degraded by  mild to moderate motion on all sequences.  Straightening of the cervical lordosis. Normal alignment. Negative for fracture or mass. Spinal cord signal normal.  C2-3:  Mild uncinate spurring on the left.  C3-4: Small central disc protrusion without significant spinal stenosis.  C4-5: Small central disc protrusion without spinal or foraminal stenosis.  C5-6: Mild disc degeneration. Mild diffuse uncinate spurring. Mild facet degeneration on the right. Mild right foraminal narrowing. No significant spinal stenosis.  C6-7: Disc degeneration and spondylosis. Mild foraminal narrowing bilaterally  C7-T1:  Mild disc degeneration without spinal stenosis.  IMPRESSION: Image quality degraded by significant motion  Mild cervical degenerative change without significant spinal stenosis or neural impingement.   Electronically Signed   By: Franchot Gallo M.D.   On: 11/27/2014 14:43      Imaging Review Dg Chest 2 View  12/22/2014   CLINICAL DATA:  Chest pain. Shortness of breath. Symptoms started yesterday. COPD.  EXAM: CHEST  2 VIEW  COMPARISON:  10/09/2014  FINDINGS: Mild to moderate enlargement of the cardiopericardial silhouette. Linear opacity along the right hemidiaphragm favoring platelike atelectasis.  Motion artifact and body habitus on the lateral projection complicate assessment. There is poor definition of the posterior hemidiaphragms, which  can be a sign of lower lobe airspace opacity but which in this case is probably more related to the motion artifact.  Degenerative left glenohumeral arthropathy.  IMPRESSION: 1. Subsegmental atelectasis at the right lung base. 2. Lower lobes and hemidiaphragmatic contours are obscured on the lateral projection due to motion artifact. This reduces negative predictive value for lower lobe retro-diaphragmatic airspace opacity. 3. Mild-to-moderate enlargement of the cardiopericardial silhouette, without edema.   Electronically Signed   By: Van Clines M.D.   On: 12/22/2014 13:27   Dg Elbow Complete Left  12/22/2014   CLINICAL DATA:  Acute left elbow pain for 2 days.  No known injury.  EXAM: LEFT ELBOW - COMPLETE 3+ VIEW  COMPARISON:  None.  FINDINGS: Slight limited secondary to patient's inability to position.  There is no evidence of acute fracture, subluxation or dislocation.  The joint space is unremarkable.  No definite effusion is noted.  IMPRESSION: No definite abnormality but slightly limited secondary to patient's inability to position.   Electronically Signed   By: Margarette Canada M.D.   On: 12/22/2014 14:55   I have personally reviewed and evaluated these images and lab results as part of my medical decision-making.   EKG Interpretation None     ED ECG REPORT   Date: 12/22/2014  Rate: 89  Rhythm: normal sinus rhythm  QRS Axis: normal  Intervals: normal  ST/T Wave abnormalities: nonspecific ST/T changes  Conduction Disutrbances:right bundle branch block  Narrative Interpretation:   Old EKG Reviewed: none available  I have personally reviewed the EKG tracing and agree with the computerized printout as noted.   MDM   Final diagnoses:  None    Patient presents with pain all over but mostly in her chest and back. She's had similar symptoms in the past multiple times. Is no evidence of pneumonia or pneumothorax. It's reproducible on palpation and I feel it's likely musculoskeletal. However she has had a past history of PE and her d-dimer was elevated. She's currently awaiting a CTA of her chest. She does have pain diffusely in her extremities but she has localized pain in her right elbow with a small area of erythema which looks suspicious for an early cellulitis. She has exquisite tenderness on palpation and any range of motion of the elbow which makes me concerned about a possible septic joint.. There is no palpable effusion. I will start her on some antibiotics. Her creatinine is elevated but appears to be at baseline.  She has some mild anemia which is just slightly lower than her baseline values.  16:54 I spoke with Dr. Sharol Given about pt's elbow.  He requests uric acid level.  Pt given IV clindamycin in the ED, he recommends d/c on doxycycline, f/u this week with Dr. Sharol Given.  Still awaiting CTA chest.  Will turn over to Dr. Oleta Mouse.  If CT neg, pt's CP and back pain seem to be more chronic related to her musculoskeletal disease. If the CT is negative, she can be discharged with follow-up with her PCP and Dr. due to this week, on doxycycline.  Malvin Johns, MD 12/22/14 [phone]

## 2014-12-22 NOTE — ED Notes (Signed)
Patient transported to X-ray 

## 2014-12-22 NOTE — ED Notes (Signed)
Ems gave 4mg  morphine and nitro x1. Pt took 324mg  of ASA PTA.

## 2015-01-07 ENCOUNTER — Ambulatory Visit (INDEPENDENT_AMBULATORY_CARE_PROVIDER_SITE_OTHER): Payer: Self-pay | Admitting: Neurology

## 2015-01-07 ENCOUNTER — Ambulatory Visit (INDEPENDENT_AMBULATORY_CARE_PROVIDER_SITE_OTHER): Payer: Medicare HMO | Admitting: Neurology

## 2015-01-07 DIAGNOSIS — M545 Low back pain: Secondary | ICD-10-CM | POA: Diagnosis not present

## 2015-01-07 DIAGNOSIS — Z0289 Encounter for other administrative examinations: Secondary | ICD-10-CM

## 2015-01-07 DIAGNOSIS — R269 Unspecified abnormalities of gait and mobility: Secondary | ICD-10-CM

## 2015-01-07 DIAGNOSIS — R202 Paresthesia of skin: Secondary | ICD-10-CM | POA: Diagnosis not present

## 2015-01-07 DIAGNOSIS — E1142 Type 2 diabetes mellitus with diabetic polyneuropathy: Secondary | ICD-10-CM

## 2015-01-07 NOTE — Progress Notes (Signed)
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

## 2015-01-07 NOTE — Procedures (Signed)
   NCS (NERVE CONDUCTION STUDY) WITH EMG (ELECTROMYOGRAPHY) REPORT   STUDY DATE: Jan 07 2015 PATIENT NAME: [name] [name] DOB: 20-Jul-1940 MRN: [id number]    TECHNOLOGIST: Laretta Alstrom ELECTROMYOGRAPHER: Marcial Pacas M.D.  CLINICAL INFORMATION:   74 years old female with history of lumbar decompression surgery, now presenting with chronic low back pain, radiating pain to bilateral lower extremity, gait difficulty.  FINDINGS: NERVE CONDUCTION STUDY: Bilateral sural, peroneal sensory responses were absent. Bilateral tibial motor responses were absent. Bilateral peroneal to EDB motor response showed severely decreased C map amplitude, normal distal latency, conduction velocity.  Bilateral ulnar sensory and motor responses were normal.  Bilateral median sensory response showed severely prolonged distal latency, within normal range snap amplitude. Bilateral median motor responses showed severely prolonged distal latency, with normal C map amplitude, conduction velocity.  NEEDLE ELECTROMYOGRAPHY: Selected needle examinations was performed at left lower extremity muscles, left lumbosacral paraspinal muscles.   Left tibialis anterior, peroneal longus: Normal insertion activity, no spontaneous activity, mildly enlarged motor unit potential, with mildly decreased recruitment patterns  Left tibialis posterior, medial gastrocnemius: Normal insertion activity, no spontaneous activity, normal morphology motor unit potential, with normal recruitment patterns  Left biceps femoris long head: Normal insertion activity, no spontaneous activity, mildly enlarged motor unit potential, with mildly decreased recruitment patterns.  Left gluteus medius: Normal insertion activity, no spontaneous activity, Normal morphology motor unit potential, with normal recruitment patterns.   She has well-healed midline lumbar scar, there was no spontaneous activity along left lumbosacral paraspinals, left L4, L5,  S1.  IMPRESSION:  This is an abnormal study. 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 There was also evidence of mild chronic lumbar radiculopathies, mainly involving L 4, L5 myotomes.    INTERPRETING PHYSICIAN:   Marcial Pacas M.D. Ph.D. [facility] [address] [place], [place] [address] [phone]

## 2015-01-26 ENCOUNTER — Ambulatory Visit
Admission: RE | Admit: 2015-01-26 | Discharge: 2015-01-26 | Disposition: A | Discharge status: Home / Self Care | Payer: Medicare HMO | Source: Ambulatory Visit | Attending: Neurology | Admitting: Neurology

## 2015-01-26 DIAGNOSIS — R202 Paresthesia of skin: Secondary | ICD-10-CM | POA: Diagnosis not present

## 2015-01-26 DIAGNOSIS — R269 Unspecified abnormalities of gait and mobility: Secondary | ICD-10-CM

## 2015-01-26 DIAGNOSIS — M545 Low back pain: Secondary | ICD-10-CM | POA: Diagnosis not present

## 2015-01-26 DIAGNOSIS — E1142 Type 2 diabetes mellitus with diabetic polyneuropathy: Secondary | ICD-10-CM

## 2015-01-27 ENCOUNTER — Telehealth: Payer: Self-pay | Admitting: Neurology

## 2015-01-27 NOTE — Telephone Encounter (Signed)
Spoke to patient - she is aware of results and will keep her follow up appt on 02/04/15 to further discuss.

## 2015-01-27 NOTE — Telephone Encounter (Signed)
[name] [name], please call patient, MRI of lumbar showed significant lumbar degenerative changes, spinal stenosis at L3 and 4 level,  I will review the MRI findings with her at next follow-up   IMPRESSION: This is an abnormal MRI of the lumbar spine showing multilevel degenerative changes as detailed above. The most significant findings are:  1. There are mild degenerative changes at L1-L2 and L2-L3 that do not lead to nerve root impingement. 2. At L3-L4 there is spinal stenosis, severe left foraminal narrowing and moderately severe left lateral recess stenosis. There is probable left L3 nerve root compression and there could also be left L4 nerve root compression at this level. There is less potential for nerve root impingement on the right. 3. L4-L5 has been fused. Pedicle screws are in place. There is no nerve root impingement. 4. L5-S1 has been fused though there is no metal hardware at this level. There is no nerve root impingement.

## 2015-02-04 ENCOUNTER — Emergency Department (HOSPITAL_COMMUNITY): Payer: Medicare HMO

## 2015-02-04 ENCOUNTER — Ambulatory Visit (INDEPENDENT_AMBULATORY_CARE_PROVIDER_SITE_OTHER): Payer: Medicare HMO | Admitting: Neurology

## 2015-02-04 ENCOUNTER — Encounter (HOSPITAL_COMMUNITY): Payer: Self-pay | Admitting: Emergency Medicine

## 2015-02-04 ENCOUNTER — Encounter: Payer: Self-pay | Admitting: Neurology

## 2015-02-04 ENCOUNTER — Emergency Department (HOSPITAL_COMMUNITY)
Admission: EM | Admit: 2015-02-04 | Discharge: 2015-02-04 | Disposition: A | Discharge status: Home / Self Care | Payer: Medicare HMO | Attending: Emergency Medicine | Admitting: Emergency Medicine

## 2015-02-04 VITALS — BP 154/79 | HR 92 | Ht 65.0 in | Wt 260.0 lb

## 2015-02-04 DIAGNOSIS — Z853 Personal history of malignant neoplasm of breast: Secondary | ICD-10-CM | POA: Diagnosis not present

## 2015-02-04 DIAGNOSIS — R269 Unspecified abnormalities of gait and mobility: Secondary | ICD-10-CM

## 2015-02-04 DIAGNOSIS — G4730 Sleep apnea, unspecified: Secondary | ICD-10-CM | POA: Diagnosis not present

## 2015-02-04 DIAGNOSIS — Z86711 Personal history of pulmonary embolism: Secondary | ICD-10-CM | POA: Diagnosis not present

## 2015-02-04 DIAGNOSIS — Z7982 Long term (current) use of aspirin: Secondary | ICD-10-CM | POA: Diagnosis not present

## 2015-02-04 DIAGNOSIS — Z794 Long term (current) use of insulin: Secondary | ICD-10-CM | POA: Diagnosis not present

## 2015-02-04 DIAGNOSIS — R4182 Altered mental status, unspecified: Secondary | ICD-10-CM | POA: Diagnosis present

## 2015-02-04 DIAGNOSIS — F419 Anxiety disorder, unspecified: Secondary | ICD-10-CM | POA: Insufficient documentation

## 2015-02-04 DIAGNOSIS — Z872 Personal history of diseases of the skin and subcutaneous tissue: Secondary | ICD-10-CM | POA: Insufficient documentation

## 2015-02-04 DIAGNOSIS — Z79899 Other long term (current) drug therapy: Secondary | ICD-10-CM | POA: Diagnosis not present

## 2015-02-04 DIAGNOSIS — I5032 Chronic diastolic (congestive) heart failure: Secondary | ICD-10-CM

## 2015-02-04 DIAGNOSIS — I10 Essential (primary) hypertension: Secondary | ICD-10-CM | POA: Insufficient documentation

## 2015-02-04 DIAGNOSIS — Z9981 Dependence on supplemental oxygen: Secondary | ICD-10-CM | POA: Diagnosis not present

## 2015-02-04 DIAGNOSIS — H409 Unspecified glaucoma: Secondary | ICD-10-CM | POA: Insufficient documentation

## 2015-02-04 DIAGNOSIS — E162 Hypoglycemia, unspecified: Secondary | ICD-10-CM

## 2015-02-04 DIAGNOSIS — Z880 Allergy status to penicillin: Secondary | ICD-10-CM | POA: Diagnosis not present

## 2015-02-04 DIAGNOSIS — E11649 Type 2 diabetes mellitus with hypoglycemia without coma: Secondary | ICD-10-CM | POA: Insufficient documentation

## 2015-02-04 DIAGNOSIS — M4806 Spinal stenosis, lumbar region: Secondary | ICD-10-CM | POA: Diagnosis not present

## 2015-02-04 DIAGNOSIS — Z87891 Personal history of nicotine dependence: Secondary | ICD-10-CM | POA: Diagnosis not present

## 2015-02-04 DIAGNOSIS — K219 Gastro-esophageal reflux disease without esophagitis: Secondary | ICD-10-CM | POA: Diagnosis not present

## 2015-02-04 DIAGNOSIS — I509 Heart failure, unspecified: Secondary | ICD-10-CM | POA: Insufficient documentation

## 2015-02-04 DIAGNOSIS — E1142 Type 2 diabetes mellitus with diabetic polyneuropathy: Secondary | ICD-10-CM | POA: Diagnosis not present

## 2015-02-04 DIAGNOSIS — E785 Hyperlipidemia, unspecified: Secondary | ICD-10-CM | POA: Insufficient documentation

## 2015-02-04 DIAGNOSIS — R69 Illness, unspecified: Secondary | ICD-10-CM

## 2015-02-04 DIAGNOSIS — J449 Chronic obstructive pulmonary disease, unspecified: Secondary | ICD-10-CM | POA: Insufficient documentation

## 2015-02-04 DIAGNOSIS — M48061 Spinal stenosis, lumbar region without neurogenic claudication: Secondary | ICD-10-CM

## 2015-02-04 DIAGNOSIS — Z8739 Personal history of other diseases of the musculoskeletal system and connective tissue: Secondary | ICD-10-CM | POA: Insufficient documentation

## 2015-02-04 LAB — CBG MONITORING, ED
GLUCOSE-CAPILLARY: 64 mg/dL — AB (ref 65–99)
GLUCOSE-CAPILLARY: 83 mg/dL (ref 65–99)
Glucose-Capillary: 45 mg/dL — ABNORMAL LOW (ref 65–99)
Glucose-Capillary: 124 mg/dL — ABNORMAL HIGH (ref 65–99)
Glucose-Capillary: 136 mg/dL — ABNORMAL HIGH (ref 65–99)
Glucose-Capillary: 173 mg/dL — ABNORMAL HIGH (ref 65–99)

## 2015-02-04 LAB — URINALYSIS, ROUTINE W REFLEX MICROSCOPIC
BILIRUBIN URINE: NEGATIVE
GLUCOSE, UA: NEGATIVE mg/dL
Hgb urine dipstick: NEGATIVE
KETONES UR: NEGATIVE mg/dL
Leukocytes, UA: NEGATIVE
NITRITE: NEGATIVE
PH: 5 (ref 5.0–8.0)
Protein, ur: NEGATIVE mg/dL
SPECIFIC GRAVITY, URINE: 1.012 (ref 1.005–1.030)
Urobilinogen, UA: 0.2 mg/dL (ref 0.0–1.0)

## 2015-02-04 LAB — TROPONIN I

## 2015-02-04 LAB — LIPASE, BLOOD: LIPASE: 32 U/L (ref 22–51)

## 2015-02-04 LAB — CBC
HCT: 33.6 % — ABNORMAL LOW (ref 36.0–46.0)
Hemoglobin: 10.2 g/dL — ABNORMAL LOW (ref 12.0–15.0)
MCH: 26.8 pg (ref 26.0–34.0)
MCHC: 30.4 g/dL (ref 30.0–36.0)
MCV: 88.2 fL (ref 78.0–100.0)
PLATELETS: 383 10*3/uL (ref 150–400)
RBC: 3.81 MIL/uL — ABNORMAL LOW (ref 3.87–5.11)
RDW: 16.4 % — AB (ref 11.5–15.5)
WBC: 8.5 10*3/uL (ref 4.0–10.5)

## 2015-02-04 LAB — COMPREHENSIVE METABOLIC PANEL
ALBUMIN: 3.8 g/dL (ref 3.5–5.0)
ALK PHOS: 83 U/L (ref 38–126)
ALT: 18 U/L (ref 14–54)
AST: 26 U/L (ref 15–41)
Anion gap: 9 (ref 5–15)
BILIRUBIN TOTAL: 0.4 mg/dL (ref 0.3–1.2)
BUN: 40 mg/dL — AB (ref 6–20)
CALCIUM: 9.8 mg/dL (ref 8.9–10.3)
CO2: 29 mmol/L (ref 22–32)
CREATININE: 2.27 mg/dL — AB (ref 0.44–1.00)
Chloride: 105 mmol/L (ref 101–111)
GFR calc Af Amer: 23 mL/min — ABNORMAL LOW (ref >60)
GFR, EST NON AFRICAN AMERICAN: 20 mL/min — AB (ref >60)
GLUCOSE: 46 mg/dL — AB (ref 65–99)
POTASSIUM: 4.2 mmol/L (ref 3.5–5.1)
Sodium: 143 mmol/L (ref 135–145)
TOTAL PROTEIN: 8 g/dL (ref 6.5–8.1)

## 2015-02-04 MED ORDER — DEXTROSE 50 % IV SOLN
25.0000 mL | Freq: Once | INTRAVENOUS | Status: AC
  Administered 2015-02-04: 25 mL via INTRAVENOUS
  Filled 2015-02-04: qty 50

## 2015-02-04 NOTE — ED Notes (Signed)
Pt aware of need of urine sample. Pt has been unable to eat or drink due to drs appt, states she will inform staff when she can provide sample

## 2015-02-04 NOTE — ED Notes (Signed)
Pt given dietary meal to maintain blood sugar goal

## 2015-02-04 NOTE — ED Notes (Signed)
Pt has been d/c'd but remains in room until ride is here d/t portable oxygen is empty.  Darcella Cheshire, Agricultural consultant informed.

## 2015-02-04 NOTE — Discharge Instructions (Signed)
Hypoglycemia Low blood sugar (hypoglycemia) means that the level of sugar in your blood is lower than it should be. Signs of low blood sugar include:  Getting sweaty.  Feeling hungry.  Feeling dizzy or weak.  Feeling sleepier than normal.  Feeling nervous.  Headaches.  Having a fast heartbeat. Low blood sugar can happen fast and can be an emergency. Your doctor can do tests to check your blood sugar level. You can have low blood sugar and not have diabetes. HOME CARE  Check your blood sugar as told by your doctor. If it is less than 70 mg/dl or as told by your doctor, take 1 of the following:  3 to 4 glucose tablets.   cup clear juice.   cup soda pop, not diet.  1 cup milk.  5 to 6 hard candies.  Recheck blood sugar after 15 minutes. Repeat until it is at the right level.  Eat a snack if it is more than 1 hour until the next meal.  Only take medicine as told by your doctor.  Do not skip meals. Eat on time.  Do not drink alcohol except with meals.  Check your blood glucose before driving.  Check your blood glucose before and after exercise.  Always carry treatment with you, such as glucose pills.  Always wear a medical alert bracelet if you have diabetes. GET HELP RIGHT AWAY IF:   Your blood glucose goes below 70 mg/dl or as told by your doctor, and you:  Are confused.  Are not able to swallow.  Pass out (faint).  You cannot treat yourself. You may need someone to help you.  You have low blood sugar problems often.  You have problems from your medicines.  You are not feeling better after 3 to 4 days.  You have vision changes. MAKE SURE YOU:   Understand these instructions.  Will watch this condition.  Will get help right away if you are not doing well or get worse.   This information is not intended to replace advice given to you by your health care provider. Make sure you discuss any questions you have with your health care provider.     Document Released: 06/29/2009 Document Revised: 04/25/2014 Document Reviewed: 12/09/2014 Elsevier Interactive Patient Education 2016 [employer]  

## 2015-02-04 NOTE — ED Notes (Signed)
5 yof presents to ED with c/o numbness to right handed fingers. Patients sister brought patient to the ED due to altered mental status, starting at 0800 at a PCP appointment. Patient is A&O x3. Blood sugar in triage is 45. Denies HA, CP, nausea. Patient c/o SOB, "I always feel like that". 142mL of OJ given. VSS.

## 2015-02-04 NOTE — Progress Notes (Signed)
PATIENT: [name] [name] DOB: 1941-02-15  Chief Complaint  Patient presents with  . Back Pain    She is here to discuss her MRI and EMG/NCV results.     HISTORICAL  [name] [name] is a 74 years old right-handed  female, seen in refer by her primary care physician Dr. Nolene Ebbs for evaluation of  gait difficulty, worsening low back pain    I reviewed summarized her most recent office note and also orthopedic consultation from Dr. Basil Dess in November 11 2014 She has past medical history of asthma, right breast cancer, history of pulmonary emboli, diabetes since 1996, diabetic peripheral neuropathy, lupus, congestive heart failure, hyperlipidemia,  She presented with chronic low back pain, neck pain, getting worse since 2015, she also noticed gradual onset gait difficulty which she contributed to her multiple joints pain, whole-body deep achy pain, she also complains of worsening low back pain, radiating pain bilateral lower extremity, worsening bilateral lower extremity paresthesia, occasionally right hand paresthesia  She was evaluated at [facility] Dr. Louanne Skye,  I have personally reviewed MRI of cervical spine August 2016, mild cervical degenerative changes, without significant spinal canal, or foraminal stenosis. Laboratory evaluation in June 2016, hemoglobin 10.8, creatinine 1.5 9  UPDATE Feb 04 2015: EMG nerve conduction study in September 2016 showed evidence of mild axonal peripheral neuropathy, evidence of chronic lumbosacral radiculopathies.  I have personally reviewed MRI of the lumbar spine showing multilevel degenerative changes as detailed above. The most significant findings are:  1. There are mild degenerative changes at L1-L2 and L2-L3 that do not lead to nerve root impingement. 2. At L3-L4 there is spinal stenosis, severe left foraminal narrowing and moderately severe left lateral recess stenosis. There is probable left L3 nerve root compression and there  could also be left L4 nerve root compression at this level. There is less potential for nerve root impingement on the right. 3. L4-L5 has been fused. Pedicle screws are in place. There is no nerve root impingement. 4. L5-S1 has been fused though there is no metal hardware at this level. There is no nerve root impingement.  She has severe low back pain for one year, she complains 10 out of 10, radiating pain to her bilateral lower extremity, severe low back pain has really limited her mobility, she denies bowel and bladder incontinence, she has significant comorbidity, obesity, congestive heart failure, insulin-dependent diabetes, lupus, COPD, oxygen dependent since 2010, she lives by herself, she was brought in by her sister-in-law, but was alone at today's clinical visit    REVIEW OF SYSTEMS: Full 14 system review of systems performed and notable only for swelling in legs   ALLERGIES: Allergies  Allergen Reactions  . Penicillins Swelling  . Fentanyl Itching  . Peach [Prunus Persica] Hives  . Shellfish Allergy Hives    HOME MEDICATIONS: Current Outpatient Prescriptions  Medication Sig Dispense Refill  . acetaminophen (TYLENOL) 650 MG CR tablet Take 1,300 mg by mouth every 8 (eight) hours as needed for pain.    Marland Kitchen albuterol (PROVENTIL HFA;VENTOLIN HFA) 108 (90 BASE) MCG/ACT inhaler Inhale 2 puffs into the lungs every 6 (six) hours as needed for wheezing or shortness of breath.     Marland Kitchen albuterol (PROVENTIL) (2.5 MG/3ML) 0.083% nebulizer solution Take 2.5 mg by nebulization every 6 (six) hours as needed for wheezing or shortness of breath.     Marland Kitchen alendronate (FOSAMAX) 70 MG tablet Take 70 mg by mouth every Monday. Take with a full glass  of water on an empty stomach.    Marland Kitchen aspirin 325 MG tablet Take 325 mg by mouth daily.    Marland Kitchen atenolol (TENORMIN) 100 MG tablet Take 100 mg by mouth daily.     . budesonide (PULMICORT) 0.5 MG/2ML nebulizer solution Take 0.5 mg by nebulization every 6 (six)  hours as needed (For wheezing.).     Marland Kitchen carbamide peroxide (DEBROX) 6.5 % otic solution Place 5 drops into both ears daily as needed (clogged ears).     . cholecalciferol (VITAMIN D) 1000 UNITS tablet Take 1,000 Units by mouth daily.    . citalopram (CELEXA) 10 MG tablet Take 10 mg by mouth every evening.    . clobetasol ointment (TEMOVATE) AB-123456789 % Apply 1 application topically daily.     Marland Kitchen desonide (DESOWEN) 0.05 % cream Apply 1 application topically daily.     Marland Kitchen dexlansoprazole (DEXILANT) 60 MG capsule Take 60 mg by mouth 2 (two) times daily.    . diclofenac sodium (VOLTAREN) 1 % GEL Apply 2 g topically daily.     Marland Kitchen diltiazem (CARTIA XT) 300 MG 24 hr capsule Take 300 mg by mouth daily.    . diphenhydrAMINE (BENADRYL) 25 MG tablet Take 25 mg by mouth every 6 (six) hours as needed for itching.     . furosemide (LASIX) 40 MG tablet Take 40 mg by mouth daily.     Marland Kitchen gabapentin (NEURONTIN) 300 MG capsule Take 1 capsule (300 mg total) by mouth 2 (two) times daily. 60 capsule 6  . hydrALAZINE (APRESOLINE) 25 MG tablet Take 25 mg by mouth 3 (three) times daily.    . hydroxychloroquine (PLAQUENIL) 200 MG tablet Take 200 mg by mouth 2 (two) times daily.    . insulin aspart (NOVOLOG) 100 UNIT/ML injection Inject 15 Units into the skin 3 (three) times daily before meals.     . insulin glargine (LANTUS) 100 UNIT/ML injection Inject 40-50 Units into the skin 2 (two) times daily. She takes 50 units in the morning and 40 units at bedtime.    . isosorbide mononitrate (IMDUR) 30 MG 24 hr tablet Take 30 mg by mouth every morning.    Marland Kitchen ketotifen (ALAWAY) 0.025 % ophthalmic solution Place 1 drop into both eyes 2 (two) times daily.    Marland Kitchen latanoprost (XALATAN) 0.005 % ophthalmic solution Place 1 drop into both eyes at bedtime.    Marland Kitchen lisinopril-hydrochlorothiazide (PRINZIDE,ZESTORETIC) 20-12.5 MG per tablet Take 1 tablet by mouth daily.    . OXYGEN Inhale into the lungs. 2L/min - prn during day and every evening.    .  pravastatin (PRAVACHOL) 40 MG tablet Take 40 mg by mouth at bedtime.     . SYMBICORT 160-4.5 MCG/ACT inhaler Inhale 2 puffs into the lungs 2 (two) times daily.     . vitamin B-12 (CYANOCOBALAMIN) 1000 MCG tablet Take 1,000 mcg by mouth daily.     No current facility-administered medications for this visit.    PAST MEDICAL HISTORY: Past Medical History  Diagnosis Date  . COPD (chronic obstructive pulmonary disease) ([place])   . Diabetes mellitus without complication ([place])   . CHF (congestive heart failure) ([place])   . Lupus ([place])   . Cancer [facility])     breast cancer  . Pulmonary embolism ([place]) 01/2012     CT showed multi small PE and coumadized   . Sleep apnea 2010    C-PAP machine  . Systemic lupus erythematosus ([place])   . Hypertension   . Hyperlipidemia   .  Obesity   . Neck pain   . Low back pain   . Numbness and tingling   . Asthma   . Eczema   . Degenerative disc disease, lumbar   . Arthritis   . Glaucoma   . Osteopenia   . GERD (gastroesophageal reflux disease)   . Allergic rhinitis   . Anxiety     PAST SURGICAL HISTORY: Past Surgical History  Procedure Laterality Date  . Mastectomy      right side  . Abdominal hysterectomy  1976  . Exploratory laparotomy    . Back surgery    . Doppler echocardiography  01/25/2012    EF 55 TO 60%; LV norm.  Pryor Curia myoview  05/02/2008    EF 67% ; LV norm  . Lower extrem. venous doppler  01/25/2012     neg.  . Toe surgery      Bunion    FAMILY HISTORY: Family History  Problem Relation Age of Onset  . Heart failure Mother   . Heart failure Father   . Diabetes Brother     SOCIAL HISTORY:  Social History   Social History  . Marital Status: Single    Spouse Name: N/A  . Number of Children: 5  . Years of Education: 12   Occupational History  . Disabled    Social History Main Topics  . Smoking status: Former Research scientist (life sciences)  . Smokeless tobacco: Never Used     Comment: Quit 1980  . Alcohol Use: No  . Drug Use: No  .  Sexual Activity: Not on file   Other Topics Concern  . Not on file   Social History Narrative   Lives at home alone.   Right-handed.   2-4 cups caffeine daily.     PHYSICAL EXAM   Filed Vitals:   02/04/15 0852  BP: 154/79  Pulse: 92  Height: 5\' 5"  (1.651 m)  Weight: 260 lb (117.935 kg)    Not recorded      Body mass index is 43.27 kg/(m^2).  PHYSICAL EXAMNIATION:  Gen: NAD, conversant, well nourised, obese, well groomed                     Cardiovascular: Regular rate rhythm, no peripheral edema, warm, nontender. Eyes: Conjunctivae clear without exudates or hemorrhage Neck: Supple, no carotid bruise. Pulmonary: Clear to auscultation bilaterally   NEUROLOGICAL EXAM:  MENTAL STATUS: Speech:    Speech is normal; fluent and spontaneous with normal comprehension.  Cognition:     Orientation to time, place and person     Normal recent and remote memory     Normal Attention span and concentration     Normal Language, naming, repeating,spontaneous speech     Fund of knowledge   CRANIAL NERVES: CN II: Visual fields are full to confrontation. Pupils are round equal and briskly reactive to light. CN III, IV, VI: extraocular movement are normal. No ptosis. CN V: Facial sensation is intact to pinprick in all 3 divisions bilaterally. Corneal responses are intact.  CN VII: Face is symmetric with normal eye closure and smile. CN VIII: Hearing is normal to rubbing fingers CN IX, X: Palate elevates symmetrically. Phonation is normal. CN XI: Head turning and shoulder shrug are intact CN XII: Tongue is midline with normal movements and no atrophy.  MOTOR: There is no pronator drift of out-stretched arms. Muscle bulk and tone are normal. Muscle strength is normal.  REFLEXES: Reflexes are 1 and symmetric at the biceps,  triceps, knees, and absent at ankles. Plantar responses are flexor.  SENSORY: Length dependent decreased to light touch, pinprick,and vibration sense at  bilateral toes  COORDINATION: Rapid alternating movements and fine finger movements are intact. There is no dysmetria on finger-to-nose and heel-knee-shin.    GAIT/STANCE: She need assistant to get up from seated position, wide-based, cautious mildly unsteady gait   DIAGNOSTIC DATA (LABS, IMAGING, TESTING) - I reviewed patient records, labs, notes, testing and imaging myself where available.   ASSESSMENT AND PLAN  [name] [name] is a 74 y.o. female   Gait difficulty  Multifactorial, combination of her big body habitus, multiple joints pain, diabetic peripheral neuropathy, lumbar stenosis  Lumbar stenosis  Most severe at L3 and 4, not a good candidate for surgery due to her congestive heart failure, COPD, oxygen dependent, lack of social support  I will refer her to pain management  Diabetic peripheral neuropathy   Marcial Pacas, M.D. Ph.D.  [facility] [address], [place], [place] [address] Ph: [phone] Fax: [phone]  CC: Dr. Nolene Ebbs

## 2015-02-04 NOTE — ED Provider Notes (Signed)
CSN: [id number]     Arrival date & time 02/04/15  1056 History   First MD Initiated Contact with Patient 02/04/15 1127     Chief Complaint  Patient presents with  . Altered Mental Status  . Numbness    Right handed fingers     (Consider location/radiation/quality/duration/timing/severity/associated sxs/prior Treatment) HPI Patient went to a physician appointment this morning. She reports that she had only eaten a few crackers because she tries not to eat much before seeing her doctor. She had gone to her appointment and when leaving the office became very drowsy and confused. Her daughter took her in the car but she seemed to be more confused and drowsy. She brought her to the emergency department. On arrival her blood sugar was found to be 45. To me the patient denies a pain. She denies chest pain headache or nausea. She reports she does use home oxygen and she always feels a little short of breath. She denies she felt ill going into her appointment this morning. She reports that the appointment was for review of an MRI result for her back. Past Medical History  Diagnosis Date  . COPD (chronic obstructive pulmonary disease) ([place])   . Diabetes mellitus without complication ([place])   . CHF (congestive heart failure) ([place])   . Lupus ([place])   . Cancer [facility])     breast cancer  . Pulmonary embolism ([place]) 01/2012     CT showed multi small PE and coumadized   . Sleep apnea 2010    C-PAP machine  . Systemic lupus erythematosus ([place])   . Hypertension   . Hyperlipidemia   . Obesity   . Neck pain   . Low back pain   . Numbness and tingling   . Asthma   . Eczema   . Degenerative disc disease, lumbar   . Arthritis   . Glaucoma   . Osteopenia   . GERD (gastroesophageal reflux disease)   . Allergic rhinitis   . Anxiety    Past Surgical History  Procedure Laterality Date  . Mastectomy      right side  . Abdominal hysterectomy  1976  . Exploratory laparotomy    . Back surgery    .  Doppler echocardiography  01/25/2012    EF 55 TO 60%; LV norm.  Pryor Curia myoview  05/02/2008    EF 67% ; LV norm  . Lower extrem. venous doppler  01/25/2012     neg.  . Toe surgery      Bunion   Family History  Problem Relation Age of Onset  . Heart failure Mother   . Heart failure Father   . Diabetes Brother    Social History  Substance Use Topics  . Smoking status: Former Research scientist (life sciences)  . Smokeless tobacco: Never Used     Comment: Quit 1980  . Alcohol Use: No   OB History    No data available     Review of Systems 10 Systems reviewed and are negative for acute change except as noted in the HPI.    Allergies  Penicillins; Fentanyl; Peach; and Shellfish allergy  Home Medications   Prior to Admission medications   Medication Sig Start Date End Date Taking? Authorizing Provider  acetaminophen (TYLENOL) 650 MG CR tablet Take 1,300 mg by mouth every 8 (eight) hours as needed for pain.   Yes Historical Provider, MD  albuterol (PROVENTIL HFA;VENTOLIN HFA) 108 (90 BASE) MCG/ACT inhaler Inhale 2 puffs into the lungs every 6 (  six) hours as needed for wheezing or shortness of breath.    Yes Historical Provider, MD  albuterol (PROVENTIL) (2.5 MG/3ML) 0.083% nebulizer solution Take 2.5 mg by nebulization every 6 (six) hours as needed for wheezing or shortness of breath.    Yes Historical Provider, MD  alendronate (FOSAMAX) 70 MG tablet Take 70 mg by mouth every Monday. Take with a full glass of water on an empty stomach.   Yes Historical Provider, MD  aspirin 325 MG tablet Take 325 mg by mouth daily.   Yes Historical Provider, MD  atenolol (TENORMIN) 100 MG tablet Take 100 mg by mouth daily.    Yes Historical Provider, MD  budesonide (PULMICORT) 0.5 MG/2ML nebulizer solution Take 0.5 mg by nebulization every 6 (six) hours as needed (For wheezing.).    Yes Historical Provider, MD  carbamide peroxide (DEBROX) 6.5 % otic solution Place 5 drops into both ears daily as needed (clogged ears).     Yes Historical Provider, MD  cholecalciferol (VITAMIN D) 1000 UNITS tablet Take 1,000 Units by mouth daily.   Yes Historical Provider, MD  citalopram (CELEXA) 10 MG tablet Take 10 mg by mouth daily.    Yes Historical Provider, MD  clobetasol ointment (TEMOVATE) AB-123456789 % Apply 1 application topically daily.    Yes Historical Provider, MD  desonide (DESOWEN) 0.05 % cream Apply 1 application topically daily.    Yes Historical Provider, MD  dexlansoprazole (DEXILANT) 60 MG capsule Take 60 mg by mouth 2 (two) times daily.   Yes Historical Provider, MD  diclofenac sodium (VOLTAREN) 1 % GEL Apply 2 g topically daily.    Yes Historical Provider, MD  diltiazem (CARTIA XT) 300 MG 24 hr capsule Take 300 mg by mouth daily.   Yes Historical Provider, MD  diphenhydrAMINE (BENADRYL) 25 MG tablet Take 25 mg by mouth every 6 (six) hours as needed for itching.    Yes Historical Provider, MD  furosemide (LASIX) 40 MG tablet Take 40 mg by mouth 2 (two) times daily.    Yes Historical Provider, MD  gabapentin (NEURONTIN) 300 MG capsule Take 1 capsule (300 mg total) by mouth 2 (two) times daily. 12/08/14  Yes Marcial Pacas, MD  hydrALAZINE (APRESOLINE) 25 MG tablet Take 25 mg by mouth 3 (three) times daily.   Yes Historical Provider, MD  hydroxychloroquine (PLAQUENIL) 200 MG tablet Take 200 mg by mouth 2 (two) times daily.   Yes Historical Provider, MD  insulin aspart (NOVOLOG) 100 UNIT/ML injection Inject 15 Units into the skin 3 (three) times daily before meals.    Yes Historical Provider, MD  insulin glargine (LANTUS) 100 UNIT/ML injection Inject 40-50 Units into the skin 2 (two) times daily. She takes 50 units in the morning and 40 units at bedtime. 01/29/12  Yes Ripudeep Krystal Eaton, MD  isosorbide mononitrate (IMDUR) 30 MG 24 hr tablet Take 30 mg by mouth every morning.   Yes Historical Provider, MD  ketotifen (ALAWAY) 0.025 % ophthalmic solution Place 1 drop into both eyes 2 (two) times daily.   Yes Historical Provider, MD   latanoprost (XALATAN) 0.005 % ophthalmic solution Place 1 drop into both eyes at bedtime.   Yes Historical Provider, MD  lisinopril-hydrochlorothiazide (PRINZIDE,ZESTORETIC) 20-12.5 MG per tablet Take 1 tablet by mouth daily. 03/08/13  Yes Historical Provider, MD  OXYGEN Inhale into the lungs. 2L/min - prn during day and every evening.   Yes Historical Provider, MD  pravastatin (PRAVACHOL) 40 MG tablet Take 40 mg by mouth at bedtime.  Yes Historical Provider, MD  SYMBICORT 160-4.5 MCG/ACT inhaler Inhale 2 puffs into the lungs 2 (two) times daily.  04/22/13  Yes Historical Provider, MD  vitamin B-12 (CYANOCOBALAMIN) 1000 MCG tablet Take 1,000 mcg by mouth daily.   Yes Historical Provider, MD   BP 122/63 mmHg  Pulse 55  Temp(Src) 97.5 F (36.4 C) (Oral)  Resp 18  Ht 5\' 4"  (1.626 m)  Wt 258 lb (117.028 kg)  BMI 44.26 kg/m2  SpO2 100% Physical Exam  Constitutional: She is oriented to person, place, and time.  Patient is moderately obese and deconditioned in appearance. She is awake but appears slightly fatigued. She has mild increased work of breathing.  HENT:  Head: Normocephalic and atraumatic.  Nose: Nose normal.  Mouth/Throat: Oropharynx is clear and moist.  Eyes: EOM are normal. Pupils are equal, round, and reactive to light.  Neck: Neck supple.  Cardiovascular: Normal rate, regular rhythm, normal heart sounds and intact distal pulses.   Pulmonary/Chest: Effort normal and breath sounds normal.  Abdominal: Soft. Bowel sounds are normal. She exhibits no distension. There is no tenderness.  Musculoskeletal: Normal range of motion. She exhibits edema.  Patient has bilateral 2+ pitting edema lower extremities. She has thickened skin which she reports is secondary to her lupus. It is slightly red and raised. She states this is not acute.  Neurological: She is alert and oriented to person, place, and time. She has normal strength. No cranial nerve deficit. She exhibits normal muscle tone.  Coordination normal. GCS eye subscore is 4. GCS verbal subscore is 5. GCS motor subscore is 6.  Patient does not have localizing deficit. Speech is intact. She follows commands for movements of each extremity independently. She denies change in sensation to light touch.  Skin: Skin is warm, dry and intact.  Psychiatric: She has a normal mood and affect.    ED Course  Procedures (including critical care time) Labs Review Labs Reviewed  COMPREHENSIVE METABOLIC PANEL - Abnormal; Notable for the following:    Glucose, Bld 46 (*)    BUN 40 (*)    Creatinine, Ser 2.27 (*)    GFR calc non Af Amer 20 (*)    GFR calc Af Amer 23 (*)    All other components within normal limits  CBC - Abnormal; Notable for the following:    RBC 3.81 (*)    Hemoglobin 10.2 (*)    HCT 33.6 (*)    RDW 16.4 (*)    All other components within normal limits  CBG MONITORING, ED - Abnormal; Notable for the following:    Glucose-Capillary 45 (*)    All other components within normal limits  CBG MONITORING, ED - Abnormal; Notable for the following:    Glucose-Capillary 64 (*)    All other components within normal limits  CBG MONITORING, ED - Abnormal; Notable for the following:    Glucose-Capillary 124 (*)    All other components within normal limits  CBG MONITORING, ED - Abnormal; Notable for the following:    Glucose-Capillary 136 (*)    All other components within normal limits  TROPONIN I  LIPASE, BLOOD  URINALYSIS, ROUTINE W REFLEX MICROSCOPIC (NOT AT [facility])  CBG MONITORING, ED    Imaging Review Ct Head Wo Contrast  02/04/2015  CLINICAL DATA:  Confusion EXAM: CT HEAD WITHOUT CONTRAST TECHNIQUE: Contiguous axial images were obtained from the base of the skull through the vertex without intravenous contrast. COMPARISON:  None. FINDINGS: Skull and Sinuses:Negative for fracture or  destructive process. The mastoids, middle ears, and imaged paranasal sinuses are clear. Orbits: Bilateral cataract resection.  No  acute finding. Brain: No evidence of acute infarction, hemorrhage, hydrocephalus, or mass lesion/mass effect. Generalized cerebral and cerebellar atrophy with extensive cerebral white matter low density, likely chronic microvascular ischemia in this patient with multiple small vessel risk factors. IMPRESSION: 1. No acute finding. 2. Generalized atrophy with chronic microvascular ischemia. Electronically Signed   By: Monte Fantasia M.D.   On: 02/04/2015 13:05   I have personally reviewed and evaluated these images and lab results as part of my medical decision-making.   EKG Interpretation   Date/Time:  Wednesday February 04 2015 11:30:26 EDT Ventricular Rate:  71 PR Interval:  139 QRS Duration: 154 QT Interval:  456 QTC Calculation: 496 R Axis:   -51 Text Interpretation:  Sinus rhythm Right bundle branch block Confirmed by  Johnney Killian, MD, Jeannie Done [phone]) on 02/04/2015 3:01:40 PM     Patient rechecked 16:00. Patient reports that she feels at her baseline. She is chronically on oxygen and reports that her COPD has are always mildly short of breath. She denies any pain. MDM   Final diagnoses:  Hypoglycemia  Severe comorbid illness   Patient presented with acute mental status change. She did not have localizing neurologic dysfunction but global somnolence. CT does not show acute anomaly. Patient was hypoglycemic at 39. She has now been able to eat and reports she feels at baseline. Patient is discharged with return instructions.    Charlesetta Shanks, MD 02/04/15 [phone]

## 2015-02-10 ENCOUNTER — Telehealth: Payer: Self-pay | Admitting: Neurology

## 2015-02-10 NOTE — Telephone Encounter (Signed)
Ryan with [facility] is calling and states they will not be able to accommodate the patient as they do not take [employer].  Thanks!

## 2015-02-10 NOTE — Telephone Encounter (Signed)
Noted I will try somewhere else for patient.

## 2015-02-10 NOTE — Telephone Encounter (Signed)
I have sent referral to [facility] because of her insurance .

## 2015-02-22 LAB — BASIC METABOLIC PANEL
Creatinine: 2.2 mg/dL — AB (ref <=1.1)
GLUCOSE: 114 mg/dL

## 2015-02-23 ENCOUNTER — Telehealth: Payer: Self-pay | Admitting: Neurology

## 2015-02-23 NOTE — Telephone Encounter (Signed)
Called patient's insurance [employer] and [facility]. Is in network with her insurance. Called [facility] and they stated [facility] had to do referral because of her insurance. Called and spoke to Dayl at [facility] gave her information she will call [place].

## 2015-02-23 NOTE — Telephone Encounter (Signed)
I will try another place for [facility]. [facility] does not take her insurance called and left patient a detailed message about status.

## 2015-03-06 ENCOUNTER — Encounter (HOSPITAL_COMMUNITY): Payer: Self-pay | Admitting: *Deleted

## 2015-03-18 ENCOUNTER — Other Ambulatory Visit: Payer: Self-pay | Admitting: Internal Medicine

## 2015-03-18 DIAGNOSIS — E2839 Other primary ovarian failure: Secondary | ICD-10-CM

## 2015-03-23 ENCOUNTER — Ambulatory Visit (HOSPITAL_COMMUNITY)
Admission: RE | Admit: 2015-03-23 | Discharge: 2015-03-23 | Disposition: A | Discharge status: Home / Self Care | Payer: Medicare HMO | Source: Ambulatory Visit | Attending: Gastroenterology | Admitting: Gastroenterology

## 2015-03-23 ENCOUNTER — Ambulatory Visit (HOSPITAL_COMMUNITY): Payer: Medicare HMO | Admitting: Anesthesiology

## 2015-03-23 ENCOUNTER — Other Ambulatory Visit: Payer: Self-pay | Admitting: Gastroenterology

## 2015-03-23 ENCOUNTER — Encounter (HOSPITAL_COMMUNITY): Payer: Self-pay | Admitting: *Deleted

## 2015-03-23 ENCOUNTER — Encounter (HOSPITAL_COMMUNITY)
Admission: RE | Disposition: A | Discharge status: Home / Self Care | Payer: Self-pay | Source: Ambulatory Visit | Attending: Gastroenterology

## 2015-03-23 DIAGNOSIS — Z8249 Family history of ischemic heart disease and other diseases of the circulatory system: Secondary | ICD-10-CM | POA: Diagnosis not present

## 2015-03-23 DIAGNOSIS — G4733 Obstructive sleep apnea (adult) (pediatric): Secondary | ICD-10-CM | POA: Diagnosis not present

## 2015-03-23 DIAGNOSIS — E669 Obesity, unspecified: Secondary | ICD-10-CM | POA: Insufficient documentation

## 2015-03-23 DIAGNOSIS — Z794 Long term (current) use of insulin: Secondary | ICD-10-CM | POA: Diagnosis not present

## 2015-03-23 DIAGNOSIS — J449 Chronic obstructive pulmonary disease, unspecified: Secondary | ICD-10-CM | POA: Insufficient documentation

## 2015-03-23 DIAGNOSIS — Z1211 Encounter for screening for malignant neoplasm of colon: Secondary | ICD-10-CM | POA: Insufficient documentation

## 2015-03-23 DIAGNOSIS — Z9011 Acquired absence of right breast and nipple: Secondary | ICD-10-CM | POA: Diagnosis not present

## 2015-03-23 DIAGNOSIS — I5032 Chronic diastolic (congestive) heart failure: Secondary | ICD-10-CM | POA: Insufficient documentation

## 2015-03-23 DIAGNOSIS — Z86010 Personal history of colonic polyps: Secondary | ICD-10-CM | POA: Insufficient documentation

## 2015-03-23 DIAGNOSIS — K219 Gastro-esophageal reflux disease without esophagitis: Secondary | ICD-10-CM | POA: Insufficient documentation

## 2015-03-23 DIAGNOSIS — Z90710 Acquired absence of both cervix and uterus: Secondary | ICD-10-CM | POA: Insufficient documentation

## 2015-03-23 DIAGNOSIS — Z9981 Dependence on supplemental oxygen: Secondary | ICD-10-CM | POA: Insufficient documentation

## 2015-03-23 DIAGNOSIS — Z91013 Allergy to seafood: Secondary | ICD-10-CM | POA: Insufficient documentation

## 2015-03-23 DIAGNOSIS — Z853 Personal history of malignant neoplasm of breast: Secondary | ICD-10-CM | POA: Diagnosis not present

## 2015-03-23 DIAGNOSIS — E119 Type 2 diabetes mellitus without complications: Secondary | ICD-10-CM | POA: Insufficient documentation

## 2015-03-23 DIAGNOSIS — Z833 Family history of diabetes mellitus: Secondary | ICD-10-CM | POA: Diagnosis not present

## 2015-03-23 DIAGNOSIS — Z880 Allergy status to penicillin: Secondary | ICD-10-CM | POA: Insufficient documentation

## 2015-03-23 DIAGNOSIS — M329 Systemic lupus erythematosus, unspecified: Secondary | ICD-10-CM | POA: Insufficient documentation

## 2015-03-23 DIAGNOSIS — Z79899 Other long term (current) drug therapy: Secondary | ICD-10-CM | POA: Diagnosis not present

## 2015-03-23 DIAGNOSIS — D649 Anemia, unspecified: Secondary | ICD-10-CM | POA: Diagnosis not present

## 2015-03-23 DIAGNOSIS — E785 Hyperlipidemia, unspecified: Secondary | ICD-10-CM | POA: Insufficient documentation

## 2015-03-23 DIAGNOSIS — Z7982 Long term (current) use of aspirin: Secondary | ICD-10-CM | POA: Diagnosis not present

## 2015-03-23 DIAGNOSIS — I10 Essential (primary) hypertension: Secondary | ICD-10-CM | POA: Insufficient documentation

## 2015-03-23 DIAGNOSIS — Z87891 Personal history of nicotine dependence: Secondary | ICD-10-CM | POA: Diagnosis not present

## 2015-03-23 DIAGNOSIS — Z86711 Personal history of pulmonary embolism: Secondary | ICD-10-CM | POA: Diagnosis not present

## 2015-03-23 HISTORY — DX: Dependence on supplemental oxygen: Z99.81

## 2015-03-23 HISTORY — PX: COLONOSCOPY WITH PROPOFOL: SHX5780

## 2015-03-23 LAB — GLUCOSE, CAPILLARY
Glucose-Capillary: 63 mg/dL — ABNORMAL LOW (ref 65–99)
Glucose-Capillary: 87 mg/dL (ref 65–99)
Glucose-Capillary: 73 mg/dL (ref 65–99)

## 2015-03-23 SURGERY — COLONOSCOPY WITH PROPOFOL
Anesthesia: Monitor Anesthesia Care

## 2015-03-23 SURGERY — COLONOSCOPY
Anesthesia: Monitor Anesthesia Care

## 2015-03-23 MED ORDER — SODIUM CHLORIDE 0.9 % IV SOLN: INTRAVENOUS | Status: DC

## 2015-03-23 MED ORDER — PROPOFOL 10 MG/ML IV BOLUS
INTRAVENOUS | Status: DC | PRN
  Administered 2015-03-23: 40 mg via INTRAVENOUS
  Administered 2015-03-23 (×4): 20 mg via INTRAVENOUS
  Administered 2015-03-23: 40 mg via INTRAVENOUS

## 2015-03-23 MED ORDER — LACTATED RINGERS IV SOLN
INTRAVENOUS | Status: DC
  Administered 2015-03-23: 1000 mL via INTRAVENOUS

## 2015-03-23 MED ORDER — PROPOFOL 10 MG/ML IV BOLUS
INTRAVENOUS | Status: AC
  Filled 2015-03-23: qty 20

## 2015-03-23 MED ORDER — DEXTROSE 50 % IV SOLN
25.0000 mL | Freq: Once | INTRAVENOUS | Status: AC
  Administered 2015-03-23: 25 mL via INTRAVENOUS

## 2015-03-23 MED ORDER — DEXTROSE 50 % IV SOLN
INTRAVENOUS | Status: AC
  Filled 2015-03-23: qty 50

## 2015-03-23 SURGICAL SUPPLY — 21 items

## 2015-03-23 NOTE — Anesthesia Preprocedure Evaluation (Addendum)
Anesthesia Evaluation  Patient identified by MRN, date of birth, ID band Patient awake    Reviewed: Allergy & Precautions, NPO status , Patient's Chart, lab work & pertinent test results  History of Anesthesia Complications Negative for: history of anesthetic complications  Airway Mallampati: III  TM Distance: >3 FB Neck ROM: Full    Dental no notable dental hx. (+) Dental Advisory Given, Missing, Edentulous Upper   Pulmonary asthma , sleep apnea , COPD (2L O2 at night),  oxygen dependent, former smoker,    Pulmonary exam normal breath sounds clear to auscultation       Cardiovascular hypertension, Pt. on medications +CHF  Normal cardiovascular exam Rhythm:Regular Rate:Normal     Neuro/Psych PSYCHIATRIC DISORDERS Anxiety negative neurological ROS     GI/Hepatic negative GI ROS, Neg liver ROS, GERD  Medicated,  Endo/Other  diabetes, Poorly Controlled, Type 2, Insulin DependentMorbid obesity  Renal/GU negative Renal ROS  negative genitourinary   Musculoskeletal  (+) Arthritis ,   Abdominal (+) + obese,   Peds negative pediatric ROS (+)  Hematology negative hematology ROS (+)   Anesthesia Other Findings   Reproductive/Obstetrics negative OB ROS                           Anesthesia Physical Anesthesia Plan  ASA: III  Anesthesia Plan: MAC   Post-op Pain Management:    Induction: Intravenous  Airway Management Planned: Nasal Cannula  Additional Equipment:   Intra-op Plan:   Post-operative Plan:   Informed Consent: I have reviewed the patients History and Physical, chart, labs and discussed the procedure including the risks, benefits and alternatives for the proposed anesthesia with the patient or authorized representative who has indicated his/her understanding and acceptance.   Dental advisory given  Plan Discussed with: CRNA  Anesthesia Plan Comments:          Anesthesia Quick Evaluation

## 2015-03-23 NOTE — Discharge Instructions (Addendum)
Start checking stool for blood in 3 to 4 years and repeat colonoscopy if stools are positiveColonoscopy, Care After These instructions give you information on caring for yourself after your procedure. Your doctor may also give you more specific instructions. Call your doctor if you have any problems or questions after your procedure. HOME CARE  Do not drive for 24 hours.  Do not sign important papers or use machinery for 24 hours.  You may shower.  You may go back to your usual activities, but go slower for the first 24 hours.  Take rest breaks often during the first 24 hours.  Walk around or use warm packs on your belly (abdomen) if you have belly cramping or gas.  Drink enough fluids to keep your pee (urine) clear or pale yellow.  Resume your normal diet. Avoid heavy or fried foods.  Avoid drinking alcohol for 24 hours or as told by your doctor.  Only take medicines as told by your doctor. If a tissue sample (biopsy) was taken during the procedure:   Do not take aspirin or blood thinners for 7 days, or as told by your doctor.  Do not drink alcohol for 7 days, or as told by your doctor.  Eat soft foods for the first 24 hours. GET HELP IF: You still have a small amount of blood in your poop (stool) 2-3 days after the procedure. GET HELP RIGHT AWAY IF:  You have more than a small amount of blood in your poop.  You see clumps of tissue (blood clots) in your poop.  Your belly is puffy (swollen).  You feel sick to your stomach (nauseous) or throw up (vomit).  You have a fever.  You have belly pain that gets worse and medicine does not help. MAKE SURE YOU:  Understand these instructions.  Will watch your condition.  Will get help right away if you are not doing well or get worse.   This information is not intended to replace advice given to you by your health care provider. Make sure you discuss any questions you have with your health care provider.   Document  Released: 05/07/2010 Document Revised: 04/09/2013 Document Reviewed: 12/10/2012 Elsevier Interactive Patient Education [employer].

## 2015-03-23 NOTE — Transfer of Care (Signed)
Immediate Anesthesia Transfer of Care Note  Patient: [name] [name]  Procedure(s) Performed: Procedure(s): COLONOSCOPY WITH PROPOFOL (N/A)  Patient Location: PACU  Anesthesia Type:MAC  Level of Consciousness: awake, alert  and oriented  Airway & Oxygen Therapy: Patient Spontanous Breathing and Patient connected to nasal cannula oxygen  Post-op Assessment: Report given to RN and Post -op Vital signs reviewed and stable  Post vital signs: Reviewed and stable  Last Vitals:  Filed Vitals:   03/23/15 0919  BP: 171/83  Pulse: 85  Temp: 37 C  Resp: 28    Complications: No apparent anesthesia complications

## 2015-03-23 NOTE — H&P (Signed)
Subjective:   Patient is a 74 y.o. female presents with history of colon polyps. Multiple polyps removed 2 years ago. Because of her multitude of polyps it was recommended she have her procedure repeated. She's had multiple other health problems including type II diabetes and a history of congestive heart failure requiring 2 L of oxygen at nighttime. She sees her cardiologist once a year and reports that her heart has been stable. She has COPD hypertension history of pulmonary embolism and lupus.. Procedure including risks and benefits discussed in office.  Patient Active Problem List   Diagnosis Date Noted  . Chest pain 12/28/2013  . Hyperlipidemia 04/05/2013  . Systemic lupus erythematosus ([place]) 04/05/2013  . Pulmonary embolism ([place]) 01/24/2012  . Chronic diastolic heart failure ([place]) 01/24/2012  . COPD (chronic obstructive pulmonary disease) ([place]) 01/24/2012  . Diabetes mellitus ([place]) 01/24/2012  . Hypertension 01/24/2012  . Leukocytosis 01/24/2012  . Bronchitis 01/24/2012  . Anemia 01/24/2012   Past Medical History  Diagnosis Date  . COPD (chronic obstructive pulmonary disease) ([place])   . Diabetes mellitus without complication ([place])   . CHF (congestive heart failure) ([place])   . Lupus ([place])     skin  . Pulmonary embolism ([place]) 01/2012     CT showed multi small PE and coumadized   . Systemic lupus erythematosus ([place])   . Hypertension   . Hyperlipidemia   . Obesity   . Neck pain   . Low back pain   . Numbness and tingling   . Asthma   . Eczema   . Degenerative disc disease, lumbar   . Arthritis   . Glaucoma   . Osteopenia   . GERD (gastroesophageal reflux disease)   . Allergic rhinitis   . Sleep apnea 2010    no cpap used  . History of home oxygen therapy     uses 2 liters ay night and prn  . Anxiety   . Cancer [facility]) 2006    breast cancer right    Past Surgical History  Procedure Laterality Date  . Mastectomy      right side  . Abdominal hysterectomy  1976  .  Exploratory laparotomy    . Doppler echocardiography  01/25/2012    EF 55 TO 60%; LV norm.  [facility]ia myoview  05/02/2008    EF 67% ; LV norm  . Lower extrem. venous doppler  01/25/2012     neg.  . Toe surgery  1996    Bunion  . Back surgery  2006    lower  . Eye surgery Bilateral 2010    lens reaplcments for cataracts     Prescriptions prior to admission  Medication Sig Dispense Refill Last Dose  . acetaminophen (TYLENOL) 650 MG CR tablet Take 1,300 mg by mouth every 8 (eight) hours as needed for pain.   03/22/2015 at 0730  . albuterol (PROVENTIL HFA;VENTOLIN HFA) 108 (90 BASE) MCG/ACT inhaler Inhale 2 puffs into the lungs See admin instructions. Every 6 hours as needed for SOB but also takes 2 puffs BID scheduled.   03/22/2015 at 0730  . albuterol (PROVENTIL) (2.5 MG/3ML) 0.083% nebulizer solution Take 2.5 mg by nebulization every 6 (six) hours as needed for wheezing or shortness of breath.    03/22/2015 at 0730  . alendronate (FOSAMAX) 70 MG tablet Take 70 mg by mouth every Monday. Take with a full glass of water on an empty stomach.   Past Week at Unknown time  . aspirin 325 MG tablet  Take 325 mg by mouth daily.   03/22/2015 at 0730  . atenolol (TENORMIN) 100 MG tablet Take 100 mg by mouth daily.    03/23/2015 at 0730  . budesonide (PULMICORT) 0.5 MG/2ML nebulizer solution Take 0.5 mg by nebulization every 6 (six) hours as needed (For wheezing.).    03/22/2015 at 0730  . cholecalciferol (VITAMIN D) 1000 UNITS tablet Take 1,000 Units by mouth daily.   03/22/2015 at 0730  . citalopram (CELEXA) 10 MG tablet Take 10 mg by mouth daily.    03/22/2015 at 0730  . clobetasol ointment (TEMOVATE) [id number] % Apply 1 application topically 2 (two) times daily.    03/22/2015 at Unknown time  . desonide (DESOWEN) 0.05 % cream Apply 1 application topically 2 (two) times daily.    03/22/2015 at Unknown time  . dexlansoprazole (DEXILANT) 60 MG capsule Take 60 mg by mouth daily.    03/22/2015 at 0730  . diclofenac  sodium (VOLTAREN) 1 % GEL Apply 2 g topically daily.    03/22/2015 at Unknown time  . diltiazem (CARTIA XT) 300 MG 24 hr capsule Take 300 mg by mouth daily.   03/22/2015 at 0730  . furosemide (LASIX) 40 MG tablet Take 40 mg by mouth 2 (two) times daily.    03/22/2015 at 0730  . gabapentin (NEURONTIN) 300 MG capsule Take 1 capsule (300 mg total) by mouth 2 (two) times daily. 60 capsule 6 03/22/2015 at 0730  . hydrALAZINE (APRESOLINE) 25 MG tablet Take 25 mg by mouth 3 (three) times daily.   03/22/2015 at 0730  . hydroxychloroquine (PLAQUENIL) 200 MG tablet Take 200 mg by mouth daily.    03/22/2015 at 0730  . insulin aspart (NOVOLOG) 100 UNIT/ML injection Inject 10 Units into the skin 3 (three) times daily before meals.    03/22/2015 at 0730  . insulin glargine (LANTUS) 100 UNIT/ML injection Inject 40-50 Units into the skin 2 (two) times daily. She takes 50 units in the morning and 40 units at bedtime.   03/23/2015 at 0730  . isosorbide mononitrate (IMDUR) 30 MG 24 hr tablet Take 30 mg by mouth every morning.   03/22/2015 at 0730  . ketotifen (ALAWAY) 0.025 % ophthalmic solution Place 1 drop into both eyes daily.    Past Week at Unknown time  . lisinopril-hydrochlorothiazide (PRINZIDE,ZESTORETIC) 20-12.5 MG per tablet Take 1 tablet by mouth daily.   03/20/2015 at 0730  . OXYGEN Inhale into the lungs. 2L/min - prn during day and every evening.   03/22/2015 at Unknown time  . pravastatin (PRAVACHOL) 40 MG tablet Take 40 mg by mouth at bedtime.    03/22/2015 at 0730  . SYMBICORT 160-4.5 MCG/ACT inhaler Inhale 2 puffs into the lungs 2 (two) times daily.    03/22/2015 at 0730  . vitamin B-12 (CYANOCOBALAMIN) 1000 MCG tablet Take 1,000 mcg by mouth daily.   03/22/2015 at 0730  . carbamide peroxide (DEBROX) 6.5 % otic solution Place 5 drops into both ears daily as needed (clogged ears).    More than a month at Unknown time  . diphenhydrAMINE (BENADRYL) 25 MG tablet Take 25 mg by mouth every 6 (six) hours as needed for  itching.    More than a month at Unknown time   Allergies  Allergen Reactions  . Penicillins Swelling    Has patient had a PCN reaction causing immediate rash, facial/tongue/throat swelling, SOB or lightheadedness with hypotension: Yes Has patient had a PCN reaction causing severe rash involving mucus membranes or skin necrosis:  Yes Has patient had a PCN reaction that required hospitalization Yes Has patient had a PCN reaction occurring within the last 10 years: No, more than 10 yrs ago If all of the above answers are "NO", then may proceed with Cephalosporin use.   . Fentanyl Itching  . Peach [Prunus Persica] Hives  . Shellfish Allergy Hives    Social History  Substance Use Topics  . Smoking status: Former Research scientist (life sciences)  . Smokeless tobacco: Never Used     Comment: Quit 1980  . Alcohol Use: No    Family History  Problem Relation Age of Onset  . Heart failure Mother   . Heart failure Father   . Diabetes Brother      Objective:   Patient Vitals for the past 8 hrs:  BP Temp Temp src Pulse Resp SpO2 Height Weight  03/23/15 0919 (!) 171/83 mmHg 98.6 F (37 C) Oral 85 (!) 28 92 % 5\' 5"  (1.651 m) 115.214 kg (254 lb)         See MD Preop evaluation      Assessment:   1. History of colon polyps 2. Chronic congestive heart failure 3. COPD on 02 at night 4. Type II diabetes  Plan:   Have discussed in detail with the patient and we will go ahead with colonoscopy at the hospital using propofol sedation. The risk and benefits have been discussed with her.

## 2015-03-23 NOTE — Op Note (Signed)
[facility] [place], [address]   COLONOSCOPY PROCEDURE REPORT  PATIENT: [name] [name], [name] [name]  MR#: [id number] BIRTHDATE: November 30, 1940 , 74  yrs. old GENDER: female ENDOSCOPIST: Laurence Spates, MD REFERRED BY: Dr Jeanie Cooks, Dr Gwenlyn Found PROCEDURE DATE:  04-17-15 PROCEDURE:   colonoscopy ASA CLASS:   class IV INDICATIONS:patient has had a history of colon polyps with multiple colon polyps removed in the past MEDICATIONS: propofol160 mg IV  DESCRIPTION OF PROCEDURE:   After the risks and benefits and of the procedure were explained, informed consent was obtained.        The Pentax Ped Colon [id number]  endoscope was introduced through the anus and advanced to the cecum      .  The quality of the prep was good      .  The instrument was then slowly withdrawn as the colon was fully examined. Estimated blood loss is zero unless otherwise noted in this procedure report. The colonic mucosa was carefully examined upon withdrawal. No polyps were seen throughout the entire colon. There was no diverticular disease of significance. Retro flex view in the rectum was grossly normal.the scope was withdrawn   normal retro flexion          The scope was then withdrawn from the patient and the procedure completed.  WITHDRAWAL TIME:  COMPLICATIONS: There were no immediate complications. ENDOSCOPIC IMPRESSION: 1.History of Colon Polyps. Normal colonoscopy at this time RECOMMENDATIONS: Due to the patient's multiple problems I would not recommend five-year repeat colonoscopybut would recommend resuming stool test for FOB in 3 to 4 years and repeat colonoscopy for positive FOB  REPEAT EXAM: based on stool FOB  cc:Dr Avbuere, Dr. Driscilla Moats  _______________________________ eSigned:  Laurence Spates, MD 17-Apr-2015 11:06 AM   CPT CODES: ICD CODES:  The ICD and CPT codes recommended by this software are interpretations from the data that the clinical staff has captured with the  software.  The verification of the translation of this report to the ICD and CPT codes and modifiers is the sole responsibility of the health care institution and practicing physician where this report was generated.  [place]. will not be held responsible for the validity of the ICD and CPT codes included on this report.  AMA assumes no liability for data contained or not contained herein. CPT is a Designer, television/film set of the [employer].

## 2015-03-23 NOTE — Anesthesia Postprocedure Evaluation (Signed)
Anesthesia Post Note  Patient: [name] [name]  Procedure(s) Performed: Procedure(s) (LRB): COLONOSCOPY WITH PROPOFOL (N/A)  Patient location during evaluation: PACU Anesthesia Type: MAC Level of consciousness: awake and alert Pain management: pain level controlled Vital Signs Assessment: post-procedure vital signs reviewed and stable Respiratory status: spontaneous breathing, nonlabored ventilation, respiratory function stable and patient connected to nasal cannula oxygen Cardiovascular status: blood pressure returned to baseline and stable Postop Assessment: no signs of nausea or vomiting Anesthetic complications: no    Last Vitals:  Filed Vitals:   03/23/15 1120 03/23/15 1130  BP: 169/78 169/81  Pulse: 85 85  Temp:    Resp: 26 17    Last Pain:  Filed Vitals:   03/23/15 1130  PainSc: 9                  Medardo Hassing JENNETTE

## 2015-03-24 ENCOUNTER — Encounter (HOSPITAL_COMMUNITY): Payer: Self-pay | Admitting: Gastroenterology

## 2015-04-14 ENCOUNTER — Other Ambulatory Visit: Payer: Self-pay

## 2015-04-14 DIAGNOSIS — Z1231 Encounter for screening mammogram for malignant neoplasm of breast: Secondary | ICD-10-CM

## 2015-04-23 ENCOUNTER — Other Ambulatory Visit: Payer: Medicare HMO

## 2015-05-25 ENCOUNTER — Other Ambulatory Visit: Payer: Self-pay | Admitting: Internal Medicine

## 2015-05-25 ENCOUNTER — Ambulatory Visit
Admission: RE | Admit: 2015-05-25 | Discharge: 2015-05-25 | Disposition: A | Discharge status: Home / Self Care | Payer: Medicare HMO | Source: Ambulatory Visit

## 2015-05-25 ENCOUNTER — Ambulatory Visit
Admission: RE | Admit: 2015-05-25 | Discharge: 2015-05-25 | Disposition: A | Discharge status: Home / Self Care | Payer: Medicare HMO | Source: Ambulatory Visit | Attending: Internal Medicine | Admitting: Internal Medicine

## 2015-05-25 DIAGNOSIS — E2839 Other primary ovarian failure: Secondary | ICD-10-CM

## 2015-05-25 DIAGNOSIS — Z1231 Encounter for screening mammogram for malignant neoplasm of breast: Secondary | ICD-10-CM

## 2015-05-25 DIAGNOSIS — N63 Unspecified lump in unspecified breast: Secondary | ICD-10-CM

## 2015-06-04 ENCOUNTER — Other Ambulatory Visit: Payer: Self-pay | Admitting: Specialist

## 2015-06-04 DIAGNOSIS — M545 Low back pain: Secondary | ICD-10-CM

## 2015-06-04 DIAGNOSIS — M4310 Spondylolisthesis, site unspecified: Secondary | ICD-10-CM

## 2015-06-16 ENCOUNTER — Other Ambulatory Visit: Payer: Self-pay | Admitting: Specialist

## 2015-06-16 ENCOUNTER — Ambulatory Visit
Admission: RE | Admit: 2015-06-16 | Discharge: 2015-06-16 | Disposition: A | Discharge status: Home / Self Care | Payer: Medicare HMO | Source: Ambulatory Visit | Attending: Specialist | Admitting: Specialist

## 2015-06-16 DIAGNOSIS — M545 Low back pain: Secondary | ICD-10-CM

## 2015-06-16 DIAGNOSIS — M4310 Spondylolisthesis, site unspecified: Secondary | ICD-10-CM

## 2015-06-23 ENCOUNTER — Ambulatory Visit
Admission: RE | Admit: 2015-06-23 | Discharge: 2015-06-23 | Disposition: A | Discharge status: Home / Self Care | Payer: Medicare HMO | Source: Ambulatory Visit | Attending: Internal Medicine | Admitting: Internal Medicine

## 2015-06-23 DIAGNOSIS — N63 Unspecified lump in unspecified breast: Secondary | ICD-10-CM

## 2015-08-05 ENCOUNTER — Ambulatory Visit (INDEPENDENT_AMBULATORY_CARE_PROVIDER_SITE_OTHER): Payer: Medicare HMO | Admitting: Neurology

## 2015-08-05 ENCOUNTER — Encounter: Payer: Self-pay | Admitting: Neurology

## 2015-08-05 VITALS — BP 130/67 | HR 80 | Ht 65.0 in | Wt 247.0 lb

## 2015-08-05 DIAGNOSIS — M48061 Spinal stenosis, lumbar region without neurogenic claudication: Secondary | ICD-10-CM | POA: Insufficient documentation

## 2015-08-05 DIAGNOSIS — G629 Polyneuropathy, unspecified: Secondary | ICD-10-CM | POA: Insufficient documentation

## 2015-08-05 DIAGNOSIS — R269 Unspecified abnormalities of gait and mobility: Secondary | ICD-10-CM | POA: Diagnosis not present

## 2015-08-05 DIAGNOSIS — M4806 Spinal stenosis, lumbar region: Secondary | ICD-10-CM

## 2015-08-05 DIAGNOSIS — G6289 Other specified polyneuropathies: Secondary | ICD-10-CM | POA: Diagnosis not present

## 2015-08-05 NOTE — Progress Notes (Signed)
Chief Complaint  Patient presents with  . Back Pain    Reports having an epidural steroid injection on 07/27/15.  Feels this treatment has improved her pain.  . Gait Problem    Says her gait is slow and she has to use a rolling walker to assist with ambulation.      PATIENT: [name] [name] DOB: 07-Apr-1941  Chief Complaint  Patient presents with  . Back Pain    Reports having an epidural steroid injection on 07/27/15.  Feels this treatment has improved her pain.  . Gait Problem    Says her gait is slow and she has to use a rolling walker to assist with ambulation.     HISTORICAL  [name] [name] is a 75 years old right-handed  female, seen in refer by her primary care physician Dr. Nolene Ebbs for evaluation of  gait difficulty, worsening low back pain    I reviewed summarized her most recent office note and also orthopedic consultation from Dr. Basil Dess in November 11 2014 She has past medical history of asthma, right breast cancer, history of pulmonary emboli, diabetes since 1996, diabetic peripheral neuropathy, lupus, congestive heart failure, hyperlipidemia,  She presented with chronic low back pain, neck pain, getting worse since 2015, she also noticed gradual onset gait difficulty which she contributed to her multiple joints pain, whole-body deep achy pain, she also complains of worsening low back pain, radiating pain bilateral lower extremity, worsening bilateral lower extremity paresthesia, occasionally right hand paresthesia  I have personally reviewed MRI of cervical spine August 2016, mild cervical degenerative changes, without significant spinal canal, or foraminal stenosis. Laboratory evaluation in June 2016, hemoglobin 10.8, creatinine 1.5 9  UPDATE Feb 04 2015: EMG nerve conduction study in September 2016 showed evidence of mild axonal peripheral neuropathy, evidence of chronic lumbosacral radiculopathies. I have personally reviewed MRI of the lumbar spine in Oct 2016,   multilevel degenerative changes as detailed above. The most significant findings are:  1. There are mild degenerative changes at L1-L2 and L2-L3 that do not lead to nerve root impingement. 2. At L3-L4 there is spinal stenosis, severe left foraminal narrowing and moderately severe left lateral recess stenosis. There is probable left L3 nerve root compression and there could also be left L4 nerve root compression at this level. There is less potential for nerve root impingement on the right. 3. L4-L5 has been fused. Pedicle screws are in place. There is no nerve root impingement. 4. L5-S1 has been fused though there is no metal hardware at this level. There is no nerve root impingement.  She has severe low back pain for one year, she complains 10 out of 10, radiating pain to her bilateral lower extremity, severe low back pain has really limited her mobility, she denies bowel and bladder incontinence, she has significant comorbidity, obesity, congestive heart failure, insulin-dependent diabetes, lupus, COPD, oxygen dependent since 2010, she lives by herself, she was brought in by her sister-in-law, but was alone at today's clinical visit   UPDATE August 05 2015: She has received epidural injection by Dr. Ernestina Patches in April 10th, 2017, she reported 80% improvement, she had much less low back pain, she used portable oxygen, and walker,   REVIEW OF SYSTEMS: Full 14 system review of systems performed and notable only for swelling in legs   ALLERGIES: Allergies  Allergen Reactions  . Penicillins Swelling    Has patient had a PCN reaction causing immediate rash, facial/tongue/throat swelling, SOB or lightheadedness with  hypotension: Yes Has patient had a PCN reaction causing severe rash involving mucus membranes or skin necrosis: Yes Has patient had a PCN reaction that required hospitalization Yes Has patient had a PCN reaction occurring within the last 10 years: No, more than 10 yrs ago If all  of the above answers are "NO", then may proceed with Cephalosporin use.   . Fentanyl Itching  . Peach [Prunus Persica] Hives  . Shellfish Allergy Hives    HOME MEDICATIONS: Current Outpatient Prescriptions  Medication Sig Dispense Refill  . acetaminophen (TYLENOL) 650 MG CR tablet Take 1,300 mg by mouth every 8 (eight) hours as needed for pain.    Marland Kitchen albuterol (PROVENTIL HFA;VENTOLIN HFA) 108 (90 BASE) MCG/ACT inhaler Inhale 2 puffs into the lungs See admin instructions. Every 6 hours as needed for SOB but also takes 2 puffs BID scheduled.    Marland Kitchen albuterol (PROVENTIL) (2.5 MG/3ML) 0.083% nebulizer solution Take 2.5 mg by nebulization every 6 (six) hours as needed for wheezing or shortness of breath.     Marland Kitchen alendronate (FOSAMAX) 70 MG tablet Take 70 mg by mouth every Monday. Take with a full glass of water on an empty stomach.    Marland Kitchen aspirin 325 MG tablet Take 325 mg by mouth daily.    Marland Kitchen atenolol (TENORMIN) 100 MG tablet Take 100 mg by mouth daily.     . budesonide (PULMICORT) 0.5 MG/2ML nebulizer solution Take 0.5 mg by nebulization every 6 (six) hours as needed (For wheezing.).     Marland Kitchen carbamide peroxide (DEBROX) 6.5 % otic solution Place 5 drops into both ears daily as needed (clogged ears).     . cholecalciferol (VITAMIN D) 1000 UNITS tablet Take 1,000 Units by mouth daily.    . citalopram (CELEXA) 10 MG tablet Take 10 mg by mouth daily.     . clobetasol ointment (TEMOVATE) AB-123456789 % Apply 1 application topically 2 (two) times daily.     Marland Kitchen desonide (DESOWEN) 0.05 % cream Apply 1 application topically 2 (two) times daily.     Marland Kitchen dexlansoprazole (DEXILANT) 60 MG capsule Take 60 mg by mouth daily.     . diclofenac sodium (VOLTAREN) 1 % GEL Apply 2 g topically daily.     Marland Kitchen diltiazem (CARTIA XT) 300 MG 24 hr capsule Take 300 mg by mouth daily.    . diphenhydrAMINE (BENADRYL) 25 MG tablet Take 25 mg by mouth every 6 (six) hours as needed for itching.     . furosemide (LASIX) 40 MG tablet Take 40 mg by  mouth 2 (two) times daily.     Marland Kitchen gabapentin (NEURONTIN) 300 MG capsule Take 1 capsule (300 mg total) by mouth 2 (two) times daily. 60 capsule 6  . hydrALAZINE (APRESOLINE) 25 MG tablet Take 25 mg by mouth 3 (three) times daily.    . hydroxychloroquine (PLAQUENIL) 200 MG tablet Take 200 mg by mouth daily.     . insulin aspart (NOVOLOG) 100 UNIT/ML injection Inject 10 Units into the skin 3 (three) times daily before meals.     . insulin glargine (LANTUS) 100 UNIT/ML injection Inject 40-50 Units into the skin 2 (two) times daily. She takes 50 units in the morning and 40 units at bedtime.    . isosorbide mononitrate (IMDUR) 30 MG 24 hr tablet Take 30 mg by mouth every morning.    Marland Kitchen ketotifen (ALAWAY) 0.025 % ophthalmic solution Place 1 drop into both eyes daily.     Marland Kitchen lisinopril-hydrochlorothiazide (PRINZIDE,ZESTORETIC) 20-12.5 MG per tablet Take  1 tablet by mouth daily.    . OXYGEN Inhale into the lungs. 2L/min - prn during day and every evening.    . pravastatin (PRAVACHOL) 40 MG tablet Take 40 mg by mouth at bedtime.     . SYMBICORT 160-4.5 MCG/ACT inhaler Inhale 2 puffs into the lungs 2 (two) times daily.     . vitamin B-12 (CYANOCOBALAMIN) 1000 MCG tablet Take 1,000 mcg by mouth daily.     No current facility-administered medications for this visit.    PAST MEDICAL HISTORY: Past Medical History  Diagnosis Date  . COPD (chronic obstructive pulmonary disease) ([place])   . Diabetes mellitus without complication ([place])   . CHF (congestive heart failure) ([place])   . Lupus ([place])     skin  . Pulmonary embolism ([place]) 01/2012     CT showed multi small PE and coumadized   . Systemic lupus erythematosus ([place])   . Hypertension   . Hyperlipidemia   . Obesity   . Neck pain   . Low back pain   . Numbness and tingling   . Asthma   . Eczema   . Degenerative disc disease, lumbar   . Arthritis   . Glaucoma   . Osteopenia   . GERD (gastroesophageal reflux disease)   . Allergic rhinitis   . Sleep  apnea 2010    no cpap used  . History of home oxygen therapy     uses 2 liters ay night and prn  . Anxiety   . Cancer [facility]) 2006    breast cancer right    PAST SURGICAL HISTORY: Past Surgical History  Procedure Laterality Date  . Mastectomy      right side  . Abdominal hysterectomy  1976  . Exploratory laparotomy    . Doppler echocardiography  01/25/2012    EF 55 TO 60%; LV norm.  Pryor Curia myoview  05/02/2008    EF 67% ; LV norm  . Lower extrem. venous doppler  01/25/2012     neg.  . Toe surgery  1996    Bunion  . Back surgery  2006    lower  . Eye surgery Bilateral 2010    lens reaplcments for cataracts   . Colonoscopy with propofol N/A 03/23/2015    Procedure: COLONOSCOPY WITH PROPOFOL;  Surgeon: Laurence Spates, MD;  Location: [facility] ENDOSCOPY;  Service: Endoscopy;  Laterality: N/A;    FAMILY HISTORY: Family History  Problem Relation Age of Onset  . Heart failure Mother   . Heart failure Father   . Diabetes Brother     SOCIAL HISTORY:  Social History   Social History  . Marital Status: Single    Spouse Name: N/A  . Number of Children: 5  . Years of Education: 12   Occupational History  . Disabled    Social History Main Topics  . Smoking status: Former Research scientist (life sciences)  . Smokeless tobacco: Never Used     Comment: Quit 1980  . Alcohol Use: No  . Drug Use: No  . Sexual Activity: Not on file   Other Topics Concern  . Not on file   Social History Narrative   Lives at home alone.   Right-handed.   2-4 cups caffeine daily.     PHYSICAL EXAM   Filed Vitals:   08/05/15 0903  BP: 130/67  Pulse: 80  Height: 5\' 5"  (1.651 m)  Weight: 247 lb (112.038 kg)    Not recorded      Body mass index is  41.1 kg/(m^2).  PHYSICAL EXAMNIATION:  Gen: NAD, conversant, well nourised, obese, well groomed                     Cardiovascular: Regular rate rhythm, no peripheral edema, warm, nontender. Eyes: Conjunctivae clear without exudates or hemorrhage Neck: Supple,  no carotid bruise. Pulmonary: Clear to auscultation bilaterally   NEUROLOGICAL EXAM:  MENTAL STATUS: Speech:    Speech is normal; fluent and spontaneous with normal comprehension.  Cognition:     Orientation to time, place and person     Normal recent and remote memory     Normal Attention span and concentration     Normal Language, naming, repeating,spontaneous speech     Fund of knowledge   CRANIAL NERVES: CN II: Visual fields are full to confrontation. Pupils are round equal and briskly reactive to light. CN III, IV, VI: extraocular movement are normal. No ptosis. CN V: Facial sensation is intact to pinprick in all 3 divisions bilaterally. Corneal responses are intact.  CN VII: Face is symmetric with normal eye closure and smile. CN VIII: Hearing is normal to rubbing fingers CN IX, X: Palate elevates symmetrically. Phonation is normal. CN XI: Head turning and shoulder shrug are intact CN XII: Tongue is midline with normal movements and no atrophy.  MOTOR: There is no pronator drift of out-stretched arms. Muscle bulk and tone are normal. Muscle strength is normal.  REFLEXES: Reflexes are 1 and symmetric at the biceps, triceps, knees, and absent at ankles. Plantar responses are flexor.  SENSORY: Length dependent decreased to light touch, pinprick to distal shin,and decreased vibratory sensation at bilateral toes, she has bilateral lower extremity pedal edema.  COORDINATION: Rapid alternating movements and fine finger movements are intact. There is no dysmetria on finger-to-nose and heel-knee-shin.    GAIT/STANCE: She need assistant to get up from seated position, wide-based, cautious mildly unsteady gait   DIAGNOSTIC DATA (LABS, IMAGING, TESTING) - I reviewed patient records, labs, notes, testing and imaging myself where available.   ASSESSMENT AND PLAN  ELIANNAH BOROWICZ is a 75 y.o. female   Gait difficulty  Multifactorial, combination of her big body habitus, multiple  joints pain, diabetic peripheral neuropathy, lumbar stenosis, COPD, oxygen dependent, congestive heart failure.  I have referred her to home physical therapy  Lumbar stenosis  Most severe at L3 and 4, not a good candidate for surgery due to her congestive heart failure, COPD, oxygen dependent, lack of social support  Continue pain management   Marcial Pacas, M.D. Ph.D.  [facility] [address], [place], [place] [address] Ph: [phone] Fax: [phone]  CC: Dr. Nolene Ebbs

## 2015-08-10 ENCOUNTER — Telehealth: Payer: Self-pay | Admitting: Neurology

## 2015-08-10 NOTE — Telephone Encounter (Signed)
Patrecia Pace PT with [facility] is calling for an order to continue therapy for patient 2 X week over the course of 60 days.  Please call.

## 2015-08-10 NOTE — Telephone Encounter (Signed)
Spoke to [place] - he will place order for the additional PT (2 x week for 60 days) and send over for Dr. Krista Blue to sign.

## 2015-08-12 ENCOUNTER — Telehealth: Payer: Self-pay | Admitting: Cardiovascular Disease

## 2015-08-12 NOTE — Telephone Encounter (Signed)
Received records from [facility] for appointment on 08/26/15 with Dr Gwenlyn Found.  Records given to [facility] (medical records) for Dr Kennon Holter schedule on 08/26/15. lp

## 2015-08-24 ENCOUNTER — Telehealth: Payer: Self-pay | Admitting: Cardiovascular Disease

## 2015-08-26 ENCOUNTER — Ambulatory Visit: Payer: Medicare HMO | Admitting: Cardiovascular Disease

## 2015-08-28 NOTE — Telephone Encounter (Signed)
Closed encounter °

## 2015-09-04 ENCOUNTER — Ambulatory Visit: Payer: Medicare HMO | Admitting: Cardiovascular Disease

## 2015-09-07 ENCOUNTER — Encounter (HOSPITAL_BASED_OUTPATIENT_CLINIC_OR_DEPARTMENT_OTHER): Payer: Medicare HMO | Attending: Internal Medicine

## 2015-09-07 DIAGNOSIS — Z853 Personal history of malignant neoplasm of breast: Secondary | ICD-10-CM | POA: Diagnosis not present

## 2015-09-07 DIAGNOSIS — I87331 Chronic venous hypertension (idiopathic) with ulcer and inflammation of right lower extremity: Secondary | ICD-10-CM | POA: Insufficient documentation

## 2015-09-07 DIAGNOSIS — L930 Discoid lupus erythematosus: Secondary | ICD-10-CM | POA: Diagnosis not present

## 2015-09-07 DIAGNOSIS — Z87891 Personal history of nicotine dependence: Secondary | ICD-10-CM | POA: Diagnosis not present

## 2015-09-07 DIAGNOSIS — E1140 Type 2 diabetes mellitus with diabetic neuropathy, unspecified: Secondary | ICD-10-CM | POA: Diagnosis not present

## 2015-09-07 DIAGNOSIS — G4733 Obstructive sleep apnea (adult) (pediatric): Secondary | ICD-10-CM | POA: Insufficient documentation

## 2015-09-07 DIAGNOSIS — E1142 Type 2 diabetes mellitus with diabetic polyneuropathy: Secondary | ICD-10-CM | POA: Diagnosis not present

## 2015-09-07 DIAGNOSIS — Z794 Long term (current) use of insulin: Secondary | ICD-10-CM | POA: Diagnosis not present

## 2015-09-07 DIAGNOSIS — E11622 Type 2 diabetes mellitus with other skin ulcer: Secondary | ICD-10-CM | POA: Diagnosis not present

## 2015-09-07 DIAGNOSIS — I110 Hypertensive heart disease with heart failure: Secondary | ICD-10-CM | POA: Insufficient documentation

## 2015-09-07 DIAGNOSIS — J45909 Unspecified asthma, uncomplicated: Secondary | ICD-10-CM | POA: Diagnosis not present

## 2015-09-07 DIAGNOSIS — J449 Chronic obstructive pulmonary disease, unspecified: Secondary | ICD-10-CM | POA: Diagnosis not present

## 2015-09-07 DIAGNOSIS — I509 Heart failure, unspecified: Secondary | ICD-10-CM | POA: Insufficient documentation

## 2015-09-07 DIAGNOSIS — L97811 Non-pressure chronic ulcer of other part of right lower leg limited to breakdown of skin: Secondary | ICD-10-CM | POA: Diagnosis not present

## 2015-09-15 ENCOUNTER — Ambulatory Visit (INDEPENDENT_AMBULATORY_CARE_PROVIDER_SITE_OTHER): Payer: Medicare HMO | Admitting: Cardiovascular Disease

## 2015-09-15 ENCOUNTER — Encounter: Payer: Self-pay | Admitting: Cardiovascular Disease

## 2015-09-15 VITALS — BP 128/64 | HR 80 | Ht 64.0 in | Wt 237.0 lb

## 2015-09-15 DIAGNOSIS — I5032 Chronic diastolic (congestive) heart failure: Secondary | ICD-10-CM

## 2015-09-15 DIAGNOSIS — I10 Essential (primary) hypertension: Secondary | ICD-10-CM | POA: Diagnosis not present

## 2015-09-15 DIAGNOSIS — E785 Hyperlipidemia, unspecified: Secondary | ICD-10-CM

## 2015-09-15 NOTE — Assessment & Plan Note (Signed)
History of hypertension and blood pressure measured 120/64.She is on atenolol, diltiazem lisinopril and hydrochlorothiazide. Continue current meds at current dosing

## 2015-09-15 NOTE — Assessment & Plan Note (Signed)
History of diastolic heart failure with preserved ejection fraction by 2-D echo 2013. She did have grade 1 diastolic dysfunction. She has lower extremity edema and has what appears to be venous insufficiency with venous ulcers. We will recheck a 2-D echocardiogram.

## 2015-09-15 NOTE — Assessment & Plan Note (Signed)
istory of hyperlipidemia not on statin therapy with recent lipid profile performed 05/19/15 revealed total cholesterol 167, LDL 105 and HDL of 40.

## 2015-09-15 NOTE — Progress Notes (Signed)
09/15/2015 Joesph Fillers Loud   08-11-1940  SK:[id number]  Primary Physician Philis Fendt, MD Primary Cardiologist: .Lorretta Harp MD Renae Gloss   HPI:  The patient is a 75 year old severely overweight widowed [place] American female, mother of 1 and grandmother to 2 grandchildren, whom I last saw 04/01/14.Marland Kitchen She has a history of congestive heart failure probably related to diastolic dysfunction with normal LV function. She had moderate concentric LVH and grade 1 diastolic dysfunction by echo January 25, 2012. Her other problems include hypertension, non-insulin-dependent diabetes, hyperlipidemia, and obstructive sleep apnea. She had a negative Myoview May 02, 2008. She was admitted October 8-13 of this year with shortness of breath. The CT angiogram showed multiple small pulmonary emboli and she was coumadinized. Her lower extremity venous Dopplers were negative. Her primary care physician has since stopped her Coumadin anticoagulation. Since I saw her a year ago she's been asymptomatic other than chronic shortness of breath. She has had large semi-edema with some venous ulcerations. She is aware of some restriction and is on diuretic therapy for diastolic dysfunction.  Current Outpatient Prescriptions  Medication Sig Dispense Refill  . acetaminophen (TYLENOL) 650 MG CR tablet Take 1,300 mg by mouth every 8 (eight) hours as needed for pain.    Marland Kitchen albuterol (PROVENTIL HFA;VENTOLIN HFA) 108 (90 BASE) MCG/ACT inhaler Inhale 2 puffs into the lungs See admin instructions. Every 6 hours as needed for SOB but also takes 2 puffs BID scheduled.    Marland Kitchen albuterol (PROVENTIL) (2.5 MG/3ML) 0.083% nebulizer solution Take 2.5 mg by nebulization every 6 (six) hours as needed for wheezing or shortness of breath.     Marland Kitchen alendronate (FOSAMAX) 70 MG tablet Take 70 mg by mouth every Monday. Take with a full glass of water on an empty stomach.    Marland Kitchen aspirin 325 MG tablet Take 325 mg by mouth daily.    Marland Kitchen  atenolol (TENORMIN) 100 MG tablet Take 100 mg by mouth daily.     . budesonide (PULMICORT) 0.5 MG/2ML nebulizer solution Take 0.5 mg by nebulization every 6 (six) hours as needed (For wheezing.).     Marland Kitchen carbamide peroxide (DEBROX) 6.5 % otic solution Place 5 drops into both ears daily as needed (clogged ears).     . cholecalciferol (VITAMIN D) 1000 UNITS tablet Take 1,000 Units by mouth daily.    . citalopram (CELEXA) 10 MG tablet Take 10 mg by mouth daily.     . clobetasol ointment (TEMOVATE) AB-123456789 % Apply 1 application topically 2 (two) times daily.     Marland Kitchen desonide (DESOWEN) 0.05 % cream Apply 1 application topically 2 (two) times daily.     Marland Kitchen dexlansoprazole (DEXILANT) 60 MG capsule Take 60 mg by mouth daily.     . diclofenac sodium (VOLTAREN) 1 % GEL Apply 2 g topically daily.     Marland Kitchen diltiazem (CARTIA XT) 300 MG 24 hr capsule Take 300 mg by mouth daily.    . diphenhydrAMINE (BENADRYL) 25 MG tablet Take 25 mg by mouth every 6 (six) hours as needed for itching.     . furosemide (LASIX) 40 MG tablet Take 40 mg by mouth 2 (two) times daily.     Marland Kitchen gabapentin (NEURONTIN) 300 MG capsule Take 1 capsule (300 mg total) by mouth 2 (two) times daily. 60 capsule 6  . hydrALAZINE (APRESOLINE) 25 MG tablet Take 25 mg by mouth 3 (three) times daily.    . hydroxychloroquine (PLAQUENIL) 200 MG tablet Take 200 mg by  mouth daily.     . insulin aspart (NOVOLOG) 100 UNIT/ML injection Inject 10 Units into the skin 3 (three) times daily before meals.     . insulin glargine (LANTUS) 100 UNIT/ML injection Inject 40-50 Units into the skin 2 (two) times daily. She takes 50 units in the morning and 40 units at bedtime.    . isosorbide mononitrate (IMDUR) 30 MG 24 hr tablet Take 30 mg by mouth every morning.    Marland Kitchen ketotifen (ALAWAY) 0.025 % ophthalmic solution Place 1 drop into both eyes daily.     Marland Kitchen lisinopril-hydrochlorothiazide (PRINZIDE,ZESTORETIC) 20-12.5 MG per tablet Take 1 tablet by mouth daily.    . OXYGEN Inhale into  the lungs. 2L/min - prn during day and every evening.    . pravastatin (PRAVACHOL) 40 MG tablet Take 40 mg by mouth at bedtime.     . SYMBICORT 160-4.5 MCG/ACT inhaler Inhale 2 puffs into the lungs 2 (two) times daily.     . vitamin B-12 (CYANOCOBALAMIN) 1000 MCG tablet Take 1,000 mcg by mouth daily.     No current facility-administered medications for this visit.    Allergies  Allergen Reactions  . Penicillins Swelling    Has patient had a PCN reaction causing immediate rash, facial/tongue/throat swelling, SOB or lightheadedness with hypotension: Yes Has patient had a PCN reaction causing severe rash involving mucus membranes or skin necrosis: Yes Has patient had a PCN reaction that required hospitalization Yes Has patient had a PCN reaction occurring within the last 10 years: No, more than 10 yrs ago If all of the above answers are "NO", then may proceed with Cephalosporin use.   . Fentanyl Itching  . Peach [Prunus Persica] Hives  . Shellfish Allergy Hives    Social History   Social History  . Marital Status: Single    Spouse Name: N/A  . Number of Children: 5  . Years of Education: 12   Occupational History  . Disabled    Social History Main Topics  . Smoking status: Former Research scientist (life sciences)  . Smokeless tobacco: Never Used     Comment: Quit 1980  . Alcohol Use: No  . Drug Use: No  . Sexual Activity: Not on file   Other Topics Concern  . Not on file   Social History Narrative   Lives at home alone.   Right-handed.   2-4 cups caffeine daily.     Review of Systems: General: negative for chills, fever, night sweats or weight changes.  Cardiovascular: negative for chest pain, dyspnea on exertion, edema, orthopnea, palpitations, paroxysmal nocturnal dyspnea or shortness of breath Dermatological: negative for rash Respiratory: negative for cough or wheezing Urologic: negative for hematuria Abdominal: negative for nausea, vomiting, diarrhea, bright red blood per rectum,  melena, or hematemesis Neurologic: negative for visual changes, syncope, or dizziness All other systems reviewed and are otherwise negative except as noted above.    Blood pressure 128/64, pulse 80, height 5\' 4"  (1.626 m), weight 237 lb (107.502 kg).  General appearance: alert and no distress Neck: no adenopathy, no carotid bruit, no JVD, supple, symmetrical, trachea midline and thyroid not enlarged, symmetric, no tenderness/mass/nodules Lungs: clear to auscultation bilaterally Heart: regular rate and rhythm, S1, S2 normal, no murmur, click, rub or gallop Extremities: 1-2+ pitting edema. There are chronic venous stasis changes.  EKG normal sinus rhythm at 80 with right bundle branch block. Personally reviewed this EKG  ASSESSMENT AND PLAN:   Chronic diastolic heart failure History of diastolic heart failure with preserved  ejection fraction by 2-D echo 2013. She did have grade 1 diastolic dysfunction. She has lower extremity edema and has what appears to be venous insufficiency with venous ulcers. We will recheck a 2-D echocardiogram.  Hypertension History of hypertension and blood pressure measured 120/64.She is on atenolol, diltiazem lisinopril and hydrochlorothiazide. Continue current meds at current dosing  Hyperlipidemia istory of hyperlipidemia not on statin therapy with recent lipid profile performed 05/19/15 revealed total cholesterol 167, LDL 105 and HDL of 40.      Lorretta Harp MD FACP,FACC,FAHA, FSCAI 09/15/2015 12:12 PM

## 2015-09-15 NOTE — Patient Instructions (Signed)
Medication Instructions:  Your physician recommends that you continue on your current medications as directed. Please refer to the Current Medication list given to you today.   Labwork: none  Testing/Procedures: Your physician has requested that you have an echocardiogram. Echocardiography is a painless test that uses sound waves to create images of your heart. It provides your doctor with information about the size and shape of your heart and how well your heart's chambers and valves are working. This procedure takes approximately one hour. There are no restrictions for this procedure.    Follow-Up: Your physician wants you to follow-up in: 12 MONTHS WITH DR BERRY. You will receive a reminder letter in the mail two months in advance. If you don't receive a letter, please call our office to schedule the follow-up appointment.   Any Other Special Instructions Will Be Listed Below (If Applicable).     If you need a refill on your cardiac medications before your next appointment, please call your pharmacy.   

## 2015-09-17 ENCOUNTER — Encounter (HOSPITAL_BASED_OUTPATIENT_CLINIC_OR_DEPARTMENT_OTHER): Payer: Medicare HMO | Attending: Internal Medicine

## 2015-09-17 DIAGNOSIS — E11622 Type 2 diabetes mellitus with other skin ulcer: Secondary | ICD-10-CM | POA: Diagnosis not present

## 2015-09-17 DIAGNOSIS — I110 Hypertensive heart disease with heart failure: Secondary | ICD-10-CM | POA: Diagnosis not present

## 2015-09-17 DIAGNOSIS — G4730 Sleep apnea, unspecified: Secondary | ICD-10-CM | POA: Insufficient documentation

## 2015-09-17 DIAGNOSIS — Z923 Personal history of irradiation: Secondary | ICD-10-CM | POA: Diagnosis not present

## 2015-09-17 DIAGNOSIS — I509 Heart failure, unspecified: Secondary | ICD-10-CM | POA: Insufficient documentation

## 2015-09-17 DIAGNOSIS — E1142 Type 2 diabetes mellitus with diabetic polyneuropathy: Secondary | ICD-10-CM | POA: Insufficient documentation

## 2015-09-17 DIAGNOSIS — J449 Chronic obstructive pulmonary disease, unspecified: Secondary | ICD-10-CM | POA: Diagnosis not present

## 2015-09-17 DIAGNOSIS — M329 Systemic lupus erythematosus, unspecified: Secondary | ICD-10-CM | POA: Insufficient documentation

## 2015-09-17 DIAGNOSIS — L97212 Non-pressure chronic ulcer of right calf with fat layer exposed: Secondary | ICD-10-CM | POA: Insufficient documentation

## 2015-09-17 DIAGNOSIS — I87331 Chronic venous hypertension (idiopathic) with ulcer and inflammation of right lower extremity: Secondary | ICD-10-CM | POA: Diagnosis not present

## 2015-09-17 DIAGNOSIS — L97821 Non-pressure chronic ulcer of other part of left lower leg limited to breakdown of skin: Secondary | ICD-10-CM | POA: Insufficient documentation

## 2015-09-24 DIAGNOSIS — I87331 Chronic venous hypertension (idiopathic) with ulcer and inflammation of right lower extremity: Secondary | ICD-10-CM | POA: Diagnosis not present

## 2015-09-30 ENCOUNTER — Other Ambulatory Visit (HOSPITAL_COMMUNITY): Payer: Medicare HMO

## 2015-10-01 DIAGNOSIS — I87331 Chronic venous hypertension (idiopathic) with ulcer and inflammation of right lower extremity: Secondary | ICD-10-CM | POA: Diagnosis not present

## 2015-10-06 ENCOUNTER — Other Ambulatory Visit (HOSPITAL_COMMUNITY): Payer: Medicare HMO

## 2015-10-08 DIAGNOSIS — I87331 Chronic venous hypertension (idiopathic) with ulcer and inflammation of right lower extremity: Secondary | ICD-10-CM | POA: Diagnosis not present

## 2015-10-08 LAB — GLUCOSE, CAPILLARY: GLUCOSE-CAPILLARY: 304 mg/dL — AB (ref 65–99)

## 2015-10-15 DIAGNOSIS — I87331 Chronic venous hypertension (idiopathic) with ulcer and inflammation of right lower extremity: Secondary | ICD-10-CM | POA: Diagnosis not present

## 2015-10-22 ENCOUNTER — Other Ambulatory Visit: Payer: Self-pay | Admitting: *Deleted

## 2015-10-22 ENCOUNTER — Ambulatory Visit (HOSPITAL_COMMUNITY)
Admission: RE | Admit: 2015-10-22 | Discharge: 2015-10-22 | Disposition: A | Discharge status: Home / Self Care | Payer: Medicare HMO | Source: Ambulatory Visit | Attending: Vascular Surgery | Admitting: Vascular Surgery

## 2015-10-22 ENCOUNTER — Other Ambulatory Visit: Payer: Self-pay | Admitting: Internal Medicine

## 2015-10-22 ENCOUNTER — Encounter (HOSPITAL_BASED_OUTPATIENT_CLINIC_OR_DEPARTMENT_OTHER): Payer: Medicare HMO | Attending: Internal Medicine

## 2015-10-22 DIAGNOSIS — L97821 Non-pressure chronic ulcer of other part of left lower leg limited to breakdown of skin: Secondary | ICD-10-CM | POA: Insufficient documentation

## 2015-10-22 DIAGNOSIS — L930 Discoid lupus erythematosus: Secondary | ICD-10-CM | POA: Diagnosis not present

## 2015-10-22 DIAGNOSIS — E119 Type 2 diabetes mellitus without complications: Secondary | ICD-10-CM | POA: Insufficient documentation

## 2015-10-22 DIAGNOSIS — G4730 Sleep apnea, unspecified: Secondary | ICD-10-CM | POA: Insufficient documentation

## 2015-10-22 DIAGNOSIS — E1140 Type 2 diabetes mellitus with diabetic neuropathy, unspecified: Secondary | ICD-10-CM | POA: Diagnosis not present

## 2015-10-22 DIAGNOSIS — I509 Heart failure, unspecified: Secondary | ICD-10-CM | POA: Insufficient documentation

## 2015-10-22 DIAGNOSIS — F419 Anxiety disorder, unspecified: Secondary | ICD-10-CM | POA: Insufficient documentation

## 2015-10-22 DIAGNOSIS — I83893 Varicose veins of bilateral lower extremities with other complications: Secondary | ICD-10-CM

## 2015-10-22 DIAGNOSIS — J449 Chronic obstructive pulmonary disease, unspecified: Secondary | ICD-10-CM | POA: Diagnosis not present

## 2015-10-22 DIAGNOSIS — E785 Hyperlipidemia, unspecified: Secondary | ICD-10-CM | POA: Diagnosis not present

## 2015-10-22 DIAGNOSIS — L97211 Non-pressure chronic ulcer of right calf limited to breakdown of skin: Secondary | ICD-10-CM | POA: Diagnosis not present

## 2015-10-22 DIAGNOSIS — I87331 Chronic venous hypertension (idiopathic) with ulcer and inflammation of right lower extremity: Secondary | ICD-10-CM | POA: Insufficient documentation

## 2015-10-22 DIAGNOSIS — R609 Edema, unspecified: Secondary | ICD-10-CM

## 2015-10-22 DIAGNOSIS — Z923 Personal history of irradiation: Secondary | ICD-10-CM | POA: Diagnosis not present

## 2015-10-22 DIAGNOSIS — I110 Hypertensive heart disease with heart failure: Secondary | ICD-10-CM | POA: Insufficient documentation

## 2015-10-22 DIAGNOSIS — K219 Gastro-esophageal reflux disease without esophagitis: Secondary | ICD-10-CM | POA: Diagnosis not present

## 2015-10-29 ENCOUNTER — Ambulatory Visit (HOSPITAL_COMMUNITY): Payer: Medicare HMO | Attending: Internal Medicine

## 2015-10-29 ENCOUNTER — Other Ambulatory Visit: Payer: Self-pay

## 2015-10-29 DIAGNOSIS — Z87891 Personal history of nicotine dependence: Secondary | ICD-10-CM | POA: Insufficient documentation

## 2015-10-29 DIAGNOSIS — Z6841 Body Mass Index (BMI) 40.0 and over, adult: Secondary | ICD-10-CM | POA: Diagnosis not present

## 2015-10-29 DIAGNOSIS — I10 Essential (primary) hypertension: Secondary | ICD-10-CM

## 2015-10-29 DIAGNOSIS — E119 Type 2 diabetes mellitus without complications: Secondary | ICD-10-CM | POA: Insufficient documentation

## 2015-10-29 DIAGNOSIS — I5032 Chronic diastolic (congestive) heart failure: Secondary | ICD-10-CM | POA: Diagnosis not present

## 2015-10-29 DIAGNOSIS — I110 Hypertensive heart disease with heart failure: Secondary | ICD-10-CM | POA: Diagnosis not present

## 2015-10-29 DIAGNOSIS — G4733 Obstructive sleep apnea (adult) (pediatric): Secondary | ICD-10-CM | POA: Insufficient documentation

## 2015-10-29 DIAGNOSIS — I87331 Chronic venous hypertension (idiopathic) with ulcer and inflammation of right lower extremity: Secondary | ICD-10-CM | POA: Diagnosis not present

## 2015-10-29 DIAGNOSIS — E785 Hyperlipidemia, unspecified: Secondary | ICD-10-CM | POA: Insufficient documentation

## 2015-10-29 LAB — ECHOCARDIOGRAM COMPLETE
AVLVOTPG: 5 mmHg
CHL CUP TV REG PEAK VELOCITY: 341 cm/s
EERAT: 9.75
EWDT: 180 ms
FS: 48 % — AB (ref 28–44)
IV/PV OW: 0.92
LA vol A4C: 30 ml
LA vol: 31 mL
LADIAMINDEX: 1.9 cm/m2
LASIZE: 40 mm
LAVOLIN: 14.8 mL/m2
LEFT ATRIUM END SYS DIAM: 40 mm
LV E/e' medial: 9.75
LV E/e'average: 9.75
LV PW d: 13 mm — AB (ref 0.6–1.1)
LV SIMPSON'S DISK: 57
LV TDI E'LATERAL: 9.21
LV TDI E'MEDIAL: 6.8
LV dias vol index: 38 mL/m2
LV dias vol: 79 mL (ref 46–106)
LV e' LATERAL: 9.21 cm/s
LVOT VTI: 25.2 cm
LVOT area: 3.46 cm2
LVOT peak vel: 112 cm/s
LVOTD: 21 mm
LVOTSV: 87 mL
LVSYSVOL: 34 mL (ref 14–42)
LVSYSVOLIN: 16 mL/m2
MV Dec: 180
MV Peak grad: 3 mmHg
MVPKAVEL: 99.2 m/s
MVPKEVEL: 89.8 m/s
Stroke v: 45 ml
TR max vel: 341 cm/s

## 2015-11-05 DIAGNOSIS — I87331 Chronic venous hypertension (idiopathic) with ulcer and inflammation of right lower extremity: Secondary | ICD-10-CM | POA: Diagnosis not present

## 2015-11-12 DIAGNOSIS — I87331 Chronic venous hypertension (idiopathic) with ulcer and inflammation of right lower extremity: Secondary | ICD-10-CM | POA: Diagnosis not present

## 2015-11-19 ENCOUNTER — Encounter (HOSPITAL_BASED_OUTPATIENT_CLINIC_OR_DEPARTMENT_OTHER): Payer: Medicare HMO | Attending: Internal Medicine

## 2015-11-19 DIAGNOSIS — E11622 Type 2 diabetes mellitus with other skin ulcer: Secondary | ICD-10-CM | POA: Insufficient documentation

## 2015-11-19 DIAGNOSIS — I509 Heart failure, unspecified: Secondary | ICD-10-CM | POA: Insufficient documentation

## 2015-11-19 DIAGNOSIS — L97219 Non-pressure chronic ulcer of right calf with unspecified severity: Secondary | ICD-10-CM | POA: Diagnosis not present

## 2015-11-19 DIAGNOSIS — Z923 Personal history of irradiation: Secondary | ICD-10-CM | POA: Diagnosis not present

## 2015-11-19 DIAGNOSIS — I87331 Chronic venous hypertension (idiopathic) with ulcer and inflammation of right lower extremity: Secondary | ICD-10-CM | POA: Diagnosis present

## 2015-11-19 DIAGNOSIS — M329 Systemic lupus erythematosus, unspecified: Secondary | ICD-10-CM | POA: Insufficient documentation

## 2015-11-19 DIAGNOSIS — J449 Chronic obstructive pulmonary disease, unspecified: Secondary | ICD-10-CM | POA: Insufficient documentation

## 2015-11-19 DIAGNOSIS — G4730 Sleep apnea, unspecified: Secondary | ICD-10-CM | POA: Insufficient documentation

## 2015-11-19 DIAGNOSIS — I110 Hypertensive heart disease with heart failure: Secondary | ICD-10-CM | POA: Insufficient documentation

## 2015-11-19 DIAGNOSIS — E1142 Type 2 diabetes mellitus with diabetic polyneuropathy: Secondary | ICD-10-CM | POA: Diagnosis not present

## 2015-11-26 DIAGNOSIS — I87331 Chronic venous hypertension (idiopathic) with ulcer and inflammation of right lower extremity: Secondary | ICD-10-CM | POA: Diagnosis not present

## 2015-12-03 DIAGNOSIS — I87331 Chronic venous hypertension (idiopathic) with ulcer and inflammation of right lower extremity: Secondary | ICD-10-CM | POA: Diagnosis not present

## 2015-12-05 ENCOUNTER — Encounter (INDEPENDENT_AMBULATORY_CARE_PROVIDER_SITE_OTHER): Payer: Self-pay

## 2015-12-10 DIAGNOSIS — I87331 Chronic venous hypertension (idiopathic) with ulcer and inflammation of right lower extremity: Secondary | ICD-10-CM | POA: Diagnosis not present

## 2015-12-14 ENCOUNTER — Other Ambulatory Visit: Payer: Self-pay | Admitting: Internal Medicine

## 2015-12-14 DIAGNOSIS — E2839 Other primary ovarian failure: Secondary | ICD-10-CM

## 2015-12-17 DIAGNOSIS — I87331 Chronic venous hypertension (idiopathic) with ulcer and inflammation of right lower extremity: Secondary | ICD-10-CM | POA: Diagnosis not present

## 2015-12-24 ENCOUNTER — Encounter (HOSPITAL_BASED_OUTPATIENT_CLINIC_OR_DEPARTMENT_OTHER): Payer: Medicare HMO | Attending: Internal Medicine

## 2015-12-24 DIAGNOSIS — E1140 Type 2 diabetes mellitus with diabetic neuropathy, unspecified: Secondary | ICD-10-CM | POA: Insufficient documentation

## 2015-12-24 DIAGNOSIS — G4730 Sleep apnea, unspecified: Secondary | ICD-10-CM | POA: Insufficient documentation

## 2015-12-24 DIAGNOSIS — J449 Chronic obstructive pulmonary disease, unspecified: Secondary | ICD-10-CM | POA: Insufficient documentation

## 2015-12-24 DIAGNOSIS — L97211 Non-pressure chronic ulcer of right calf limited to breakdown of skin: Secondary | ICD-10-CM | POA: Diagnosis not present

## 2015-12-24 DIAGNOSIS — I110 Hypertensive heart disease with heart failure: Secondary | ICD-10-CM | POA: Insufficient documentation

## 2015-12-24 DIAGNOSIS — Z923 Personal history of irradiation: Secondary | ICD-10-CM | POA: Diagnosis not present

## 2015-12-24 DIAGNOSIS — E1142 Type 2 diabetes mellitus with diabetic polyneuropathy: Secondary | ICD-10-CM | POA: Diagnosis not present

## 2015-12-24 DIAGNOSIS — I509 Heart failure, unspecified: Secondary | ICD-10-CM | POA: Diagnosis not present

## 2015-12-24 DIAGNOSIS — I87331 Chronic venous hypertension (idiopathic) with ulcer and inflammation of right lower extremity: Secondary | ICD-10-CM | POA: Insufficient documentation

## 2015-12-24 DIAGNOSIS — M329 Systemic lupus erythematosus, unspecified: Secondary | ICD-10-CM | POA: Insufficient documentation

## 2015-12-24 DIAGNOSIS — E11622 Type 2 diabetes mellitus with other skin ulcer: Secondary | ICD-10-CM | POA: Diagnosis not present

## 2015-12-31 DIAGNOSIS — I87331 Chronic venous hypertension (idiopathic) with ulcer and inflammation of right lower extremity: Secondary | ICD-10-CM | POA: Diagnosis not present

## 2016-01-07 DIAGNOSIS — I87331 Chronic venous hypertension (idiopathic) with ulcer and inflammation of right lower extremity: Secondary | ICD-10-CM | POA: Diagnosis not present

## 2016-01-14 DIAGNOSIS — I87331 Chronic venous hypertension (idiopathic) with ulcer and inflammation of right lower extremity: Secondary | ICD-10-CM | POA: Diagnosis not present

## 2016-02-01 ENCOUNTER — Encounter (HOSPITAL_BASED_OUTPATIENT_CLINIC_OR_DEPARTMENT_OTHER): Payer: Medicare HMO | Attending: Internal Medicine

## 2016-02-01 DIAGNOSIS — I87332 Chronic venous hypertension (idiopathic) with ulcer and inflammation of left lower extremity: Secondary | ICD-10-CM | POA: Insufficient documentation

## 2016-02-01 DIAGNOSIS — L97221 Non-pressure chronic ulcer of left calf limited to breakdown of skin: Secondary | ICD-10-CM | POA: Diagnosis present

## 2016-02-01 DIAGNOSIS — E11622 Type 2 diabetes mellitus with other skin ulcer: Secondary | ICD-10-CM | POA: Diagnosis not present

## 2016-02-01 DIAGNOSIS — L97821 Non-pressure chronic ulcer of other part of left lower leg limited to breakdown of skin: Secondary | ICD-10-CM | POA: Insufficient documentation

## 2016-02-11 ENCOUNTER — Encounter (HOSPITAL_BASED_OUTPATIENT_CLINIC_OR_DEPARTMENT_OTHER): Payer: Medicare HMO

## 2016-02-11 DIAGNOSIS — E11622 Type 2 diabetes mellitus with other skin ulcer: Secondary | ICD-10-CM | POA: Diagnosis not present

## 2016-02-18 ENCOUNTER — Encounter (HOSPITAL_BASED_OUTPATIENT_CLINIC_OR_DEPARTMENT_OTHER): Payer: Medicare HMO | Attending: Internal Medicine

## 2016-02-18 DIAGNOSIS — Z872 Personal history of diseases of the skin and subcutaneous tissue: Secondary | ICD-10-CM | POA: Insufficient documentation

## 2016-02-18 DIAGNOSIS — Z09 Encounter for follow-up examination after completed treatment for conditions other than malignant neoplasm: Secondary | ICD-10-CM | POA: Diagnosis not present

## 2016-02-22 ENCOUNTER — Encounter (INDEPENDENT_AMBULATORY_CARE_PROVIDER_SITE_OTHER): Payer: Self-pay | Admitting: Specialist

## 2016-02-22 ENCOUNTER — Ambulatory Visit (INDEPENDENT_AMBULATORY_CARE_PROVIDER_SITE_OTHER): Payer: Medicare HMO | Admitting: Specialist

## 2016-02-22 VITALS — BP 175/88 | HR 111 | Ht 64.0 in | Wt 220.0 lb

## 2016-02-22 DIAGNOSIS — Z981 Arthrodesis status: Secondary | ICD-10-CM | POA: Diagnosis not present

## 2016-02-22 DIAGNOSIS — M48062 Spinal stenosis, lumbar region with neurogenic claudication: Secondary | ICD-10-CM

## 2016-02-22 DIAGNOSIS — M19012 Primary osteoarthritis, left shoulder: Secondary | ICD-10-CM | POA: Diagnosis not present

## 2016-02-22 MED ORDER — METHYLPREDNISOLONE ACETATE 40 MG/ML IJ SUSP
40.0000 mg | INTRAMUSCULAR | Status: AC | PRN
  Administered 2016-02-22: 40 mg via INTRA_ARTICULAR

## 2016-02-22 MED ORDER — BUPIVACAINE HCL 0.25 % IJ SOLN
4.0000 mL | INTRAMUSCULAR | Status: AC | PRN
  Administered 2016-02-22: 4 mL via INTRA_ARTICULAR

## 2016-02-22 NOTE — Progress Notes (Addendum)
Office Visit Note   Patient: [name] [name]           Date of Birth: 05-Jul-1940           MRN: [id number] Visit Date: 02/22/2016              Requested by: Nolene Ebbs, MD [address] [place], [place] [address] PCP: Philis Fendt, MD   Assessment & Plan: Visit Diagnoses:  1. Primary osteoarthritis, left shoulder   2. Spinal stenosis of lumbar region with neurogenic claudication   3. History of lumbar spinal fusion   Her pain and left shoulder is recurred and is her arthritis which is severe. Due to her medical comorbidities she has not a great candidate for left total shoulder replacement and requires the use of her arms for standing and ambulation with the use of walker. Due to the renal dysfunction she is not a candidate for nonsteroidal anti-inflammatory agents and over-the-counter or prescription. She is currently taking Plaquenil for lupus which is adequate anti-inflammatory agent. Does not request or require renewal of her tramadol. Serial injection of the left shoulder for osteoarthritis is a reasonable approach to treatment of her intrinsic left shoulder pain and may be able to decrease her use of tramadol. Her lumbar spinal stenosis is being treated conservatively with activity change and avoidance of those postures that provoke spinal stenosis. If required and would consider further epidural steroid injections.  Plan:Avoid frequent bending and stooping, prolong standing and walking. No lifting greater than 10 lbs. May use ice or moist heat for pain. Walk to tolerance and use a stationary bike for exercise. Avoid overhead lifting and overhead use of the arms. No NSAIDS (no motrin or alleve or ibuprofen) due to kidney dysfunction. Take scheduled plaquenil. Intermittant tylenol or tramadol for pain but maximum or 3-4 per day. Weight loss is of benefit. Handicap license is approved.    Follow-Up Instructions: No Follow-up on file.  Orders Placed This Encounter    Procedures     . Large Joint Injection/Arthrocentesis   Orders:  No orders of the defined types were placed in this encounter.     Procedures: Large Joint Inj Date/Time: 02/22/2016 10:53 AM Performed by: Jessy Oto Authorized by: Jessy Oto   Consent Given by:  Patient Site marked: the procedure site was marked   Indications:  Pain Location:  Shoulder Site:  L glenohumeral Prep: patient was prepped and draped in usual sterile fashion   Needle Size:  25 G Needle Length:  1.5 inches Approach:  Anterior Ultrasound Guidance: No   Fluoroscopic Guidance: No   Arthrogram: No Medications:  40 mg methylPREDNISolone acetate 40 MG/ML; 4 mL bupivacaine 0.25 % Aspiration Attempted: No   Patient tolerance:  Patient tolerated the procedure well with no immediate complications Comments: Bandaid applied at the end of the procedure.     Clinical Data: Findings:  Previous xrays have demonstrated left shoulder severe OA changes with glenohumeral narrowing and osteophytes off the inferomedial left humeral head.    Subjective: Chief Complaint  Patient presents with  . Lower Back - Pain, Follow-up  . Left Shoulder - Follow-up, Pain    Patient is returning 2 month follow up of low back pain and left shoulder. Injection in left shoulder done at last office visit 11/18/15. Patient states the injection helped for a month or so but pain has returned. Patient complains that her low back is giving her a fit too. Patient ambulates with walker. Pain  radiates into bilateral legs down to her knees. Seeing Dr. Jeanie Cooks tomorrow, Cr. Is 2.0 and  Consistent with renal insuffiency. Has complaints of left shoulder pain and difficulty with overhead use of the left arm, pain with dressing herself and with lying on the left shoulder. Previous xrays  Consistent with severe OA of the left shoulder. Still experiencing LBP over the last 2 weeks; Pain worsens with bending and stooping and with lifting heavy  weight.Has lupus which is being treated  By Dr.S. [address].Taking meds for lupus, plaquenil only. Pain in her low back and legs is worsened with prolonged standing and ambulation. She is unable to swim never learned. No bowel or bladder symptoms. She takes intermittent tramadol for pain.    Review of Systems  Constitutional: Negative.   HENT: Negative.   Eyes: Negative.   Respiratory: Negative.   Cardiovascular: Negative.   Gastrointestinal: Negative.   Endocrine: Negative.   Genitourinary: Negative.   Musculoskeletal: Positive for arthralgias, back pain, gait problem, joint swelling and myalgias.  Skin: Positive for wound. Negative for color change, pallor and rash.  Allergic/Immunologic: Negative for environmental allergies, food allergies and immunocompromised state.  Neurological: Negative for seizures, weakness and numbness.  Hematological: Negative.   Psychiatric/Behavioral: Negative.      Objective: Vital Signs: BP (!) 175/88   Pulse (!) 111   Ht 5\' 4"  (1.626 m)   Wt 220 lb (99.8 kg)   BMI 37.76 kg/m   Physical Exam  Constitutional: She appears well-developed and well-nourished.  HENT:  Head: Normocephalic and atraumatic.  Eyes: EOM are normal. Pupils are equal, round, and reactive to light.  Neck: Normal range of motion. Neck supple.  Pulmonary/Chest: Effort normal and breath sounds normal.  Abdominal: Soft. Bowel sounds are normal.  Musculoskeletal: She exhibits tenderness. She exhibits no edema or deformity.  Neurological: She displays normal reflexes. No cranial nerve deficit or sensory deficit. She exhibits normal muscle tone. Coordination normal.  Skin: Skin is warm and dry.  Psychiatric: She has a normal mood and affect. Her behavior is normal. Judgment and thought content normal.    Left Shoulder Exam   Range of Motion  Active Abduction:  60 abnormal  Passive Abduction:  120 abnormal  Extension:  20 abnormal  Forward Flexion:  50 abnormal    External Rotation:  40 abnormal   Muscle Strength  Abduction: 5/5  Internal Rotation: 5/5  External Rotation: 5/5  Supraspinatus: 5/5  Subscapularis: 5/5   Tests  Apprehension: positive Impingement: positive  Other  Erythema: absent Scars: absent Sensation: normal Pulse: present   Comments:  Left shoulder stiffness and pain with ROM grating with ROM       Specialty Comments:  No specialty comments available.  Imaging: No results found.   PMFS History: Patient Active Problem List   Diagnosis Date Noted  . Abnormality of gait 08/05/2015  . Spinal stenosis of lumbar region 08/05/2015  . Peripheral neuropathy ([place]) 08/05/2015  . Chest pain 12/28/2013  . Hyperlipidemia 04/05/2013  . Systemic lupus erythematosus ([place]) 04/05/2013  . Pulmonary embolism ([place]) 01/24/2012  . Chronic diastolic heart failure ([place]) 01/24/2012  . COPD (chronic obstructive pulmonary disease) ([place]) 01/24/2012  . Diabetes mellitus ([place]) 01/24/2012  . Hypertension 01/24/2012  . Leukocytosis 01/24/2012  . Bronchitis 01/24/2012  . Anemia 01/24/2012   Past Medical History:  Diagnosis Date  . Allergic rhinitis   . Anxiety   . Arthritis   . Asthma   . Cancer [facility]) 2006  breast cancer right  . CHF (congestive heart failure) ([place])   . COPD (chronic obstructive pulmonary disease) ([place])   . Degenerative disc disease, lumbar   . Diabetes mellitus without complication ([place])   . Eczema   . GERD (gastroesophageal reflux disease)   . Glaucoma   . History of home oxygen therapy    uses 2 liters ay night and prn  . Hyperlipidemia   . Hypertension   . Low back pain   . Lupus    skin  . Neck pain   . Numbness and tingling   . Obesity   . Osteopenia   . Pulmonary embolism ([place]) 01/2012    CT showed multi small PE and coumadized   . Sleep apnea 2010   no cpap used  . Systemic lupus erythematosus ([facility])     Family History  Problem Relation Age of Onset  . Heart failure Father   . Heart  failure Mother   . Diabetes Brother     Past Surgical History:  Procedure Laterality Date  . ABDOMINAL HYSTERECTOMY  1976  . BACK SURGERY  2006   lower  . COLONOSCOPY WITH PROPOFOL N/A 03/23/2015   Procedure: COLONOSCOPY WITH PROPOFOL;  Surgeon: Laurence Spates, MD;  Location: [facility];  Service: Endoscopy;  Laterality: N/A;  . Dobtamine myoview  05/02/2008   EF 67% ; LV norm  . DOPPLER ECHOCARDIOGRAPHY  01/25/2012   EF 55 TO 60%; LV norm.  . EXPLORATORY LAPAROTOMY    . EYE SURGERY Bilateral 2010   lens reaplcments for cataracts   . Lower Extrem. venous doppler  01/25/2012    neg.  [place] Kitchen MASTECTOMY     right side  . TOE SURGERY  1996   Bunion   Social History   Occupational History  . Disabled    Social History Main Topics  . Smoking status: Former Research scientist (life sciences)  . Smokeless tobacco: Never Used     Comment: Quit 1980  . Alcohol use No  . Drug use: No  . Sexual activity: Not on file

## 2016-02-22 NOTE — Patient Instructions (Signed)
Avoid frequent bending and stooping, prolong standing and walking. No lifting greater than 10 lbs. May use ice or moist heat for pain. Walk to tolerance and use a stationary bike for exercise. Avoid overhead lifting and overhead use of the arms. No NSAIDS (no motrin or alleve or ibuprofen) due to kidney dysfunction. Take scheduled plaquenil. Intermittant tylenol or tramadol for pain but maximum or 3-4 per day. Weight loss is of benefit. Handicap license is approved.

## 2016-05-25 ENCOUNTER — Encounter (INDEPENDENT_AMBULATORY_CARE_PROVIDER_SITE_OTHER): Payer: Self-pay | Admitting: Specialist

## 2016-05-25 ENCOUNTER — Ambulatory Visit (INDEPENDENT_AMBULATORY_CARE_PROVIDER_SITE_OTHER): Payer: Medicare HMO | Admitting: Specialist

## 2016-05-25 ENCOUNTER — Encounter (INDEPENDENT_AMBULATORY_CARE_PROVIDER_SITE_OTHER): Payer: Self-pay

## 2016-05-25 VITALS — BP 134/70 | HR 76 | Ht 64.5 in | Wt 220.0 lb

## 2016-05-25 DIAGNOSIS — M19012 Primary osteoarthritis, left shoulder: Secondary | ICD-10-CM

## 2016-05-25 DIAGNOSIS — M47812 Spondylosis without myelopathy or radiculopathy, cervical region: Secondary | ICD-10-CM | POA: Diagnosis not present

## 2016-05-25 MED ORDER — BUPIVACAINE HCL 0.25 % IJ SOLN
4.0000 mL | INTRAMUSCULAR | Status: AC | PRN
  Administered 2016-05-25: 4 mL via INTRA_ARTICULAR

## 2016-05-25 MED ORDER — METHYLPREDNISOLONE ACETATE 40 MG/ML IJ SUSP
40.0000 mg | INTRAMUSCULAR | Status: AC | PRN
  Administered 2016-05-25: 40 mg via INTRA_ARTICULAR

## 2016-05-25 NOTE — Progress Notes (Signed)
Office Visit Note   Patient: [name] [name]           Date of Birth: 23-May-1940           MRN: [id number] Visit Date: 05/25/2016              Requested by: Nolene Ebbs, MD [address] [place], [place] [address] PCP: Philis Fendt, MD   Assessment & Plan: Visit Diagnoses:  1. Primary osteoarthritis, left shoulder   2. Spondylosis without myelopathy or radiculopathy, cervical region     Plan:Avoid bending, stooping and avoid lifting weights greater than 10 lbs. Avoid prolong standing and walking. Avoid frequent bending and stooping  No lifting greater than 10 lbs. May use ice or moist heat for pain. Weight loss is of benefit. Handicap license is approved. Avoid overhead lifting and overhead use of the arms. Stretching exercises for the shoulder, and ROM of the cervical spine.    Follow-Up Instructions: Return in about 3 months (around 08/22/2016).   Orders:  Orders Placed This Encounter  Procedures  . Large Joint Injection/Arthrocentesis   No orders of the defined types were placed in this encounter.     Procedures: Large Joint Inj Date/Time: 05/25/2016 10:19 AM Performed by: Jessy Oto Authorized by: Jessy Oto   Consent Given by:  Patient Site marked: the procedure site was marked   Timeout: prior to procedure the correct patient, procedure, and site was verified   Indications:  Pain and joint swelling Location:  Shoulder Site:  L glenohumeral Prep: patient was prepped and draped in usual sterile fashion   Needle Size:  25 G Needle Length:  1.5 inches Approach:  Anterior Ultrasound Guidance: No   Fluoroscopic Guidance: No   Arthrogram: No   Medications:  40 mg methylPREDNISolone acetate 40 MG/ML; 4 mL bupivacaine 0.25 % Aspiration Attempted: No   Patient tolerance:  Patient tolerated the procedure well with no immediate complications  Bandaid applied post injection.     Clinical Data: Findings:  CT angio from 2016 shows part of the left  G-H joint with severe bone on bone narrowing and anterior osteophyte of the humeral head.    Subjective: Chief Complaint  Patient presents with  . Left Shoulder - Pain  . Lower Back - Pain    [name] is here to follow up on left shoulder pain and hewr low back pain,  She had an injection in her in her shoulder on 02/22/2016, she states that it did help her, however to renovations at her apartment complex that she has had to move 2 times since she was here last and he shoulder is hurting her again.  She would like another injection in the shoulder today. For her low back, she states that she is still having pain in it.  She states that it has worsened due to the moving.  She is taking tylenol for her pain.    Review of Systems  Constitutional: Negative.   HENT: Negative.   Eyes: Negative.   Respiratory: Negative.   Cardiovascular: Negative.   Gastrointestinal: Negative.   Endocrine: Negative.   Genitourinary: Negative.   Musculoskeletal: Negative.   Skin: Negative.   Allergic/Immunologic: Negative.   Neurological: Negative.   Hematological: Negative.   Psychiatric/Behavioral: Negative.      Objective: Vital Signs: BP 134/70 (BP Location: Left Arm, Patient Position: Sitting)   Pulse 76   Ht 5' 4.5" (1.638 m)   Wt 220 lb (99.8 kg)  BMI 37.18 kg/m   Physical Exam  Constitutional: She is oriented to person, place, and time. She appears well-developed and well-nourished.  HENT:  Head: Normocephalic and atraumatic.  Eyes: EOM are normal. Pupils are equal, round, and reactive to light.  Neck: Normal range of motion. Neck supple.  Pulmonary/Chest: Effort normal and breath sounds normal.  Abdominal: Soft. Bowel sounds are normal.  Neurological: She is alert and oriented to person, place, and time.  Skin: Skin is warm and dry.  Psychiatric: She has a normal mood and affect. Her behavior is normal. Judgment and thought content normal.    Back Exam   Tenderness  The  patient is experiencing tenderness in the cervical.  Range of Motion  Extension:  30 abnormal  Flexion: normal  Lateral Bend Right:  40 abnormal  Lateral Bend Left:  30 abnormal  Rotation Right:  30 abnormal  Rotation Left:  40 abnormal   Muscle Strength  Right Quadriceps:  5/5  Left Quadriceps:  5/5  Right Hamstrings:  5/5  Left Hamstrings:  5/5   Tests  Straight leg raise right: negative Straight leg raise left: negative  Reflexes  Patellar: normal Achilles: normal Biceps: normal Babinski's sign: normal   Other  Toe Walk: normal Heel Walk: normal Sensation: normal Gait: normal  Erythema: no back redness Scars: absent   Right Shoulder Exam  Right shoulder exam is normal.   Left Shoulder Exam   Tenderness  The patient is experiencing tenderness in the acromioclavicular joint, clavicle and acromion.  Range of Motion  Active Abduction:  80 abnormal  Passive Abduction:  90 abnormal  Extension:  20 abnormal  Forward Flexion:  70 normal  External Rotation: 50  Internal Rotation 0 degrees: L3  Internal Rotation 90 degrees: 60   Muscle Strength  Abduction: 4/5  Internal Rotation: 5/5  External Rotation: 5/5  Supraspinatus: 4/5  Subscapularis: 5/5  Biceps: 5/5   Tests  Apprehension: negative Cross Arm: positive Drop Arm: negative Hawkin's test: negative Impingement: negative  Other  Erythema: absent Scars: present Sensation: normal Pulse: present   Comments:  Bone creptitus with Rom of the left shoulder,       Specialty Comments:  No specialty comments available.  Imaging: No results found.   PMFS History: Patient Active Problem List   Diagnosis Date Noted  . Abnormality of gait 08/05/2015  . Spinal stenosis of lumbar region 08/05/2015  . Peripheral neuropathy ([place]) 08/05/2015  . Chest pain 12/28/2013  . Hyperlipidemia 04/05/2013  . Systemic lupus erythematosus ([place]) 04/05/2013  . Pulmonary embolism ([place]) 01/24/2012  . Chronic  diastolic heart failure ([place]) 01/24/2012  . COPD (chronic obstructive pulmonary disease) ([place]) 01/24/2012  . Diabetes mellitus ([place]) 01/24/2012  . Hypertension 01/24/2012  . Leukocytosis 01/24/2012  . Bronchitis 01/24/2012  . Anemia 01/24/2012   Past Medical History:  Diagnosis Date  . Allergic rhinitis   . Anxiety   . Arthritis   . Asthma   . Cancer [facility]) 2006   breast cancer right  . CHF (congestive heart failure) ([place])   . COPD (chronic obstructive pulmonary disease) ([place])   . Degenerative disc disease, lumbar   . Diabetes mellitus without complication ([place])   . Eczema   . GERD (gastroesophageal reflux disease)   . Glaucoma   . History of home oxygen therapy    uses 2 liters ay night and prn  . Hyperlipidemia   . Hypertension   . Low back pain   . Lupus  skin  . Neck pain   . Numbness and tingling   . Obesity   . Osteopenia   . Pulmonary embolism ([place]) 01/2012    CT showed multi small PE and coumadized   . Sleep apnea 2010   no cpap used  . Systemic lupus erythematosus ([facility])     Family History  Problem Relation Age of Onset  . Heart failure Father   . Heart failure Mother   . Diabetes Brother     Past Surgical History:  Procedure Laterality Date  . ABDOMINAL HYSTERECTOMY  1976  . BACK SURGERY  2006   lower  . COLONOSCOPY WITH PROPOFOL N/A 03/23/2015   Procedure: COLONOSCOPY WITH PROPOFOL;  Surgeon: Laurence Spates, MD;  [facility];  Service: Endoscopy;  Laterality: N/A;  . Dobtamine myoview  05/02/2008   EF 67% ; LV norm  . DOPPLER ECHOCARDIOGRAPHY  01/25/2012   EF 55 TO 60%; LV norm.  . EXPLORATORY LAPAROTOMY    . EYE SURGERY Bilateral 2010   lens reaplcments for cataracts   . Lower Extrem. venous doppler  01/25/2012    neg.  [place] Kitchen MASTECTOMY     right side  . TOE SURGERY  1996   Bunion   Social History   Occupational History  . Disabled    Social History Main Topics  . Smoking status: Former Research scientist (life sciences)  . Smokeless tobacco: Never Used       Comment: Quit 1980  . Alcohol use No  . Drug use: No  . Sexual activity: Not on file

## 2016-05-25 NOTE — Patient Instructions (Signed)
Avoid bending, stooping and avoid lifting weights greater than 10 lbs. Avoid prolong standing and walking. Avoid frequent bending and stooping  No lifting greater than 10 lbs. May use ice or moist heat for pain. Weight loss is of benefit. Handicap license is approved. Avoid overhead lifting and overhead use of the arms. Stretching exercises for the shoulder, and ROM of the cervical spine.

## 2016-06-07 ENCOUNTER — Telehealth: Payer: Self-pay | Admitting: Neurology

## 2016-06-07 NOTE — Telephone Encounter (Signed)
[name] with [facility] is calling to get orders for nursing once a week for 9 weeks for the patient.[name] [name]

## 2016-06-07 NOTE — Telephone Encounter (Signed)
Returned call to [name] [name] - patient seen for her back pain in 07/2015 - PT was ordered at this appt.  [name] [name] is unsure which MD put in orders for nursing care.  She will reach out to patient to see who she should contact for continued orders.

## 2016-06-28 ENCOUNTER — Other Ambulatory Visit: Payer: Self-pay | Admitting: Internal Medicine

## 2016-06-28 DIAGNOSIS — Z1231 Encounter for screening mammogram for malignant neoplasm of breast: Secondary | ICD-10-CM

## 2016-07-19 ENCOUNTER — Ambulatory Visit
Admission: RE | Admit: 2016-07-19 | Discharge: 2016-07-19 | Disposition: A | Discharge status: Home / Self Care | Payer: Medicare HMO | Source: Ambulatory Visit | Attending: Internal Medicine | Admitting: Internal Medicine

## 2016-07-19 DIAGNOSIS — Z1231 Encounter for screening mammogram for malignant neoplasm of breast: Secondary | ICD-10-CM

## 2016-08-02 ENCOUNTER — Telehealth: Payer: Self-pay | Admitting: Neurology

## 2016-08-02 NOTE — Telephone Encounter (Signed)
Returned call to [place] - no concerns with weight - she just wanted to report her most current weight for our records.

## 2016-08-02 NOTE — Telephone Encounter (Signed)
Samantha with [facility] is calling to report the patient's weight is 225.5.

## 2016-08-24 ENCOUNTER — Encounter (INDEPENDENT_AMBULATORY_CARE_PROVIDER_SITE_OTHER): Payer: Self-pay | Admitting: Specialist

## 2016-08-24 ENCOUNTER — Ambulatory Visit (INDEPENDENT_AMBULATORY_CARE_PROVIDER_SITE_OTHER): Payer: Medicare HMO | Admitting: Specialist

## 2016-08-24 VITALS — BP 140/79 | HR 97 | Ht 64.5 in | Wt 227.0 lb

## 2016-08-24 DIAGNOSIS — M47812 Spondylosis without myelopathy or radiculopathy, cervical region: Secondary | ICD-10-CM | POA: Diagnosis not present

## 2016-08-24 DIAGNOSIS — M48062 Spinal stenosis, lumbar region with neurogenic claudication: Secondary | ICD-10-CM

## 2016-08-24 DIAGNOSIS — M19012 Primary osteoarthritis, left shoulder: Secondary | ICD-10-CM | POA: Diagnosis not present

## 2016-08-24 DIAGNOSIS — Z981 Arthrodesis status: Secondary | ICD-10-CM

## 2016-08-24 DIAGNOSIS — E1142 Type 2 diabetes mellitus with diabetic polyneuropathy: Secondary | ICD-10-CM | POA: Diagnosis not present

## 2016-08-24 DIAGNOSIS — G5623 Lesion of ulnar nerve, bilateral upper limbs: Secondary | ICD-10-CM | POA: Diagnosis not present

## 2016-08-24 DIAGNOSIS — M19011 Primary osteoarthritis, right shoulder: Secondary | ICD-10-CM

## 2016-08-24 MED ORDER — TRAMADOL HCL 50 MG PO TABS: 50.0000 mg | ORAL_TABLET | Freq: Four times a day (QID) | ORAL | 0 refills | Status: DC | PRN

## 2016-08-24 NOTE — Progress Notes (Signed)
Office Visit Note   Patient: [name] [name]           Date of Birth: 1941-02-26           MRN: [id number] Visit Date: 08/24/2016              Requested by: Nolene Ebbs, MD [address] [place], [place] [address] PCP: Nolene Ebbs, MD   Assessment & Plan: Visit Diagnoses:  1. Primary osteoarthritis, left shoulder   2. Spondylosis of cervical region without myelopathy or radiculopathy   3. Cubital tunnel syndrome, bilateral   4. DM type 2 with diabetic peripheral neuropathy ([place])   5. Spinal stenosis of lumbar region with neurogenic claudication   6. History of lumbar spinal fusion   7. Primary osteoarthritis, right shoulder     Plan: Avoid overhead lifting and overhead use of the arms. Do not lift greater than 5 lbs. Adjust head rest in vehicle to prevent hyperextension if rear ended. Take extra precautions to avoid falling, including use of a cane if you feel weak. Avoid bending, stooping and avoid lifting weights greater than 10 lbs. Avoid prolong standing and walking. Avoid frequent bending and stooping  May use ice or moist heat for pain. Weight loss is of benefit. Handicap license is approved. ROM exercises, and stretching exercises of the shoulders helps.     Follow-Up Instructions: No Follow-up on file.   Orders:  No orders of the defined types were placed in this encounter.  No orders of the defined types were placed in this encounter.     Procedures: No procedures performed   Clinical Data: No additional findings.   Subjective: Chief Complaint  Patient presents with  . Neck - Follow-up  . Left Shoulder - Follow-up    76 year old female amidextrous, history of left shoulder osteoarthritis, renal insufficiency and diabetes mellitus insulin dependent. She has had neurologic tests by the podiatrist Including a biopsy of the foot a couple of weeks ago. She was sent to Dr. Jeanie Cooks to start on medications for diabetic peripheral neuropathy. Now  with persistent neck pain and shoulder pain bilaterally, left side greater than the right. Had ear ache last night. Combined ear and left shoulder with pain. She is trying to sleep in her bed, she has a new bed and Still has to go a recliner at about 2 AM. Complains of numbness and tingling with occasional sharp pains in the fingers and toes. The shoulders feel weak and hurt. The right shoulder raises higher than the left. Pain with sleeping on the shoulders, raising the arms hurts. Has numbness into the little finger side of the hand when she flexes the elbows  Has weakness in the legs with standing and walking with the right leg weakness greater than the left. Pain with prolong standing, uses a walker and tends to stoop and lean on the grocery cart. Knees want to give away.    Review of Systems  Constitutional: Negative.   HENT: Negative.   Eyes: Negative.   Respiratory: Negative.   Cardiovascular: Negative.   Gastrointestinal: Negative.   Endocrine: Negative.   Genitourinary: Negative.   Musculoskeletal: Negative.   Skin: Negative.   Allergic/Immunologic: Negative.   Neurological: Negative.   Hematological: Negative.   Psychiatric/Behavioral: Negative.      Objective: Vital Signs: BP 140/79 (BP Location: Left Arm, Patient Position: Sitting)   Pulse 97   Ht 5' 4.5" (1.638 m)   Wt 227 lb (103 kg)   BMI  38.36 kg/m   Physical Exam  Constitutional: She is oriented to person, place, and time. She appears well-developed and well-nourished.  HENT:  Head: Normocephalic and atraumatic.  Eyes: EOM are normal. Pupils are equal, round, and reactive to light.  Neck: Normal range of motion. Neck supple.  Pulmonary/Chest: Effort normal and breath sounds normal.  Abdominal: Soft. Bowel sounds are normal.  Neurological: She is alert and oriented to person, place, and time.  Skin: Skin is warm and dry.  Psychiatric: She has a normal mood and affect. Her behavior is normal. Judgment and  thought content normal.    Back Exam   Tenderness  The patient is experiencing tenderness in the cervical.  Range of Motion  Extension:  50 abnormal  Flexion:  80 normal  Lateral Bend Right:  70 abnormal  Lateral Bend Left:  50 abnormal  Rotation Right:  70 abnormal  Rotation Left:  50 abnormal   Muscle Strength  Right Quadriceps:  5/5  Left Quadriceps:  5/5  Right Hamstrings:  5/5  Left Hamstrings:  5/5   Tests  Straight leg raise right: negative Straight leg raise left: negative  Reflexes  Patellar: Hyporeflexic Achilles: Hyporeflexic Babinski's sign: normal   Other  Toe Walk: abnormal Heel Walk: abnormal Sensation: normal Gait: antalgic    Right Shoulder Exam   Tenderness  The patient is experiencing tenderness in the acromioclavicular joint and acromion.  Range of Motion  Active Abduction:  150 abnormal  Passive Abduction: 160  Extension: 20  Forward Flexion: 60  External Rotation: 70   Muscle Strength  The patient has normal right shoulder strength.  Tests  Apprehension: positive Cross Arm: positive Impingement: positive  Other  Erythema: absent Scars: absent Sensation: normal Pulse: present   Left Shoulder Exam   Tenderness  The patient is experiencing tenderness in the acromioclavicular joint and acromion.  Range of Motion  Active Abduction:  130 abnormal  Passive Abduction: 140  Extension: 20  Forward Flexion: 60  External Rotation: 60   Muscle Strength  The patient has normal left shoulder strength.  Tests  Apprehension: positive Cross Arm: positive Impingement: positive  Other  Erythema: absent Scars: absent Sensation: normal Pulse: present       Specialty Comments:  No specialty comments available.  Imaging: No results found.   PMFS History: Patient Active Problem List   Diagnosis Date Noted  . Abnormality of gait 08/05/2015  . Spinal stenosis of lumbar region 08/05/2015  . Peripheral neuropathy  08/05/2015  . Chest pain 12/28/2013  . Hyperlipidemia 04/05/2013  . Systemic lupus erythematosus ([place]) 04/05/2013  . Pulmonary embolism ([place]) 01/24/2012  . Chronic diastolic heart failure ([place]) 01/24/2012  . COPD (chronic obstructive pulmonary disease) ([place]) 01/24/2012  . Diabetes mellitus ([place]) 01/24/2012  . Hypertension 01/24/2012  . Leukocytosis 01/24/2012  . Bronchitis 01/24/2012  . Anemia 01/24/2012   Past Medical History:  Diagnosis Date  . Allergic rhinitis   . Anxiety   . Arthritis   . Asthma   . Cancer [facility]) 2006   breast cancer right  . CHF (congestive heart failure) ([place])   . COPD (chronic obstructive pulmonary disease) ([place])   . Degenerative disc disease, lumbar   . Diabetes mellitus without complication ([place])   . Eczema   . GERD (gastroesophageal reflux disease)   . Glaucoma   . History of home oxygen therapy    uses 2 liters ay night and prn  . Hyperlipidemia   . Hypertension   . Low  back pain   . Lupus    skin  . Neck pain   . Numbness and tingling   . Obesity   . Osteopenia   . Pulmonary embolism ([place]) 01/2012    CT showed multi small PE and coumadized   . Sleep apnea 2010   no cpap used  . Systemic lupus erythematosus ([facility])     Family History  Problem Relation Age of Onset  . Heart failure Father   . Heart failure Mother   . Diabetes Brother   . Breast cancer Maternal Aunt   . Breast cancer Paternal Aunt     Past Surgical History:  Procedure Laterality Date  . ABDOMINAL HYSTERECTOMY  1976  . BACK SURGERY  2006   lower  . BREAST BIOPSY Right   . BREAST LUMPECTOMY Right   . COLONOSCOPY WITH PROPOFOL N/A 03/23/2015   Procedure: COLONOSCOPY WITH PROPOFOL;  Surgeon: Laurence Spates, MD;  Location: [facility] END[facility]COPY;  Service: Endoscopy;  Laterality: N/A;  . Dobtamine myoview  05/02/2008   EF 67% ; LV norm  . DOPPLER ECHOCARDIOGRAPHY  01/25/2012   EF 55 TO 60%; LV norm.  . EXPLORATORY LAPAROTOMY    . EYE SURGERY Bilateral 2010   lens reaplcments  for cataracts   . Lower Extrem. venous doppler  01/25/2012    neg.  Marland Kitchen MASTECTOMY     right side  . TOE SURGERY  1996   Bunion   Social History   Occupational History  . Disabled    Social History Main Topics  . Smoking status: Former Research scientist (life sciences)  . Smokeless tobacco: Never Used     Comment: Quit 1980  . Alcohol use No  . Drug use: No  . Sexual activity: Not on file

## 2016-08-24 NOTE — Patient Instructions (Signed)
Avoid overhead lifting and overhead use of the arms. Do not lift greater than 5 lbs. Adjust head rest in vehicle to prevent hyperextension if rear ended. Take extra precautions to avoid falling, including use of a cane if you feel weak. Avoid bending, stooping and avoid lifting weights greater than 10 lbs. Avoid prolong standing and walking. Avoid frequent bending and stooping  May use ice or moist heat for pain. Weight loss is of benefit. Handicap license is approved. ROM exercises, and stretching exercises of the shoulders helps.

## 2016-09-28 ENCOUNTER — Ambulatory Visit (INDEPENDENT_AMBULATORY_CARE_PROVIDER_SITE_OTHER): Payer: Medicare HMO | Admitting: Cardiovascular Disease

## 2016-09-28 ENCOUNTER — Encounter: Payer: Self-pay | Admitting: Cardiovascular Disease

## 2016-09-28 VITALS — BP 126/60 | HR 94 | Ht 64.0 in | Wt 222.0 lb

## 2016-09-28 DIAGNOSIS — I10 Essential (primary) hypertension: Secondary | ICD-10-CM | POA: Diagnosis not present

## 2016-09-28 DIAGNOSIS — E7800 Pure hypercholesterolemia, unspecified: Secondary | ICD-10-CM

## 2016-09-28 DIAGNOSIS — I2601 Septic pulmonary embolism with acute cor pulmonale: Secondary | ICD-10-CM | POA: Diagnosis not present

## 2016-09-28 DIAGNOSIS — I2782 Chronic pulmonary embolism: Secondary | ICD-10-CM

## 2016-09-28 DIAGNOSIS — I5032 Chronic diastolic (congestive) heart failure: Secondary | ICD-10-CM | POA: Diagnosis not present

## 2016-09-28 NOTE — Assessment & Plan Note (Signed)
History of hyperlipidemia on pravastatin followed by her PCP 

## 2016-09-28 NOTE — Progress Notes (Signed)
09/28/2016 [name] [name]   11-11-1940  [id number]  Primary Physician Nolene Ebbs, MD Primary Cardiologist: Lorretta Harp MD Renae Gloss  HPI:  The patient is a 76 year old severely overweight widowed African American female, mother of 91 and grandmother to 2 grandchildren, whom I last saw 09/15/15.Marland Kitchen She has a history of congestive heart failure probably related to diastolic dysfunction with normal LV function. She had moderate concentric LVH and grade 1 diastolic dysfunction by echo January 25, 2012. Her other problems include hypertension, non-insulin-dependent diabetes, hyperlipidemia, and obstructive sleep apnea. She had a negative Myoview May 02, 2008. She was admitted October 8-13 of this year with shortness of breath. The CT angiogram showed multiple small pulmonary emboli and she was coumadinized. Her lower extremity venous Dopplers were negative. Her primary care physician has since stopped her Coumadin anticoagulation. Since I saw her a year ago she's been asymptomatic other than chronic shortness of breath. Her venous ulcers have healed since I saw her last. She is aware of some restriction and is on diuretic therapy for diastolic dysfunction.   Current Outpatient Prescriptions  Medication Sig Dispense Refill  . aspirin 325 MG tablet Take 325 mg by mouth daily.    Marland Kitchen atenolol (TENORMIN) 100 MG tablet Take 100 mg by mouth daily.     . budesonide (PULMICORT) 0.5 MG/2ML nebulizer solution Take 0.5 mg by nebulization every 6 (six) hours as needed (For wheezing.).     Marland Kitchen calcium carbonate (OS-CAL) 1250 (500 Ca) MG chewable tablet Chew 1 tablet by mouth daily.    . cetirizine (ZYRTEC) 10 MG tablet Take 10 mg by mouth daily.    . cholecalciferol (VITAMIN D) 1000 UNITS tablet Take 1,000 Units by mouth daily.    . citalopram (CELEXA) 10 MG tablet Take 10 mg by mouth daily.     . clobetasol ointment (TEMOVATE) 9.38 % Apply 1 application topically 2 (two) times daily.     Marland Kitchen  desonide (DESOWEN) 0.05 % cream Apply 1 application topically 2 (two) times daily.     Marland Kitchen dexlansoprazole (DEXILANT) 60 MG capsule Take 60 mg by mouth daily.     . diclofenac sodium (VOLTAREN) 1 % GEL Apply 2 g topically daily. Reported on 09/15/2015    . diltiazem (CARTIA XT) 300 MG 24 hr capsule Take 300 mg by mouth daily. Reported on 09/15/2015    . diphenhydrAMINE (BENADRYL) 25 MG tablet Take 25 mg by mouth every 6 (six) hours as needed for itching.     . furosemide (LASIX) 40 MG tablet Take 40 mg by mouth 2 (two) times daily.     Marland Kitchen gabapentin (NEURONTIN) 300 MG capsule Take 1 capsule (300 mg total) by mouth 2 (two) times daily. 60 capsule 6  . hydrALAZINE (APRESOLINE) 25 MG tablet Take 25 mg by mouth 3 (three) times daily.    . hydroxychloroquine (PLAQUENIL) 200 MG tablet Take 200 mg by mouth daily.     . insulin aspart (NOVOLOG) 100 UNIT/ML injection Inject 10 Units into the skin 3 (three) times daily before meals.     . insulin glargine (LANTUS) 100 UNIT/ML injection Inject 40-50 Units into the skin 2 (two) times daily. She takes 50 units in the morning and 40 units at bedtime.    . isosorbide mononitrate (IMDUR) 30 MG 24 hr tablet Take 30 mg by mouth every morning.    Marland Kitchen ketotifen (ALAWAY) 0.025 % ophthalmic solution Place 1 drop into both eyes daily.     Marland Kitchen  lisinopril-hydrochlorothiazide (PRINZIDE,ZESTORETIC) 20-12.5 MG per tablet Take 1 tablet by mouth daily.    . OXYGEN Inhale into the lungs. 2L/min - prn during day and every evening.    . pravastatin (PRAVACHOL) 40 MG tablet Take 40 mg by mouth at bedtime.     . pregabalin (LYRICA) 75 MG capsule Take 75 mg by mouth daily.    . SYMBICORT 160-4.5 MCG/ACT inhaler Inhale 2 puffs into the lungs 2 (two) times daily. Reported on 09/15/2015    . tizanidine (ZANAFLEX) 2 MG capsule Take 2 mg by mouth 2 (two) times daily.    . traMADol (ULTRAM) 50 MG tablet Take 1 tablet (50 mg total) by mouth every 6 (six) hours as needed for severe pain. 60 tablet 0    . vitamin B-12 (CYANOCOBALAMIN) 1000 MCG tablet Take 1,000 mcg by mouth daily. Reported on 09/15/2015    . acetaminophen (TYLENOL) 650 MG CR tablet Take 1,300 mg by mouth every 8 (eight) hours as needed for pain.    Marland Kitchen albuterol (PROVENTIL HFA;VENTOLIN HFA) 108 (90 BASE) MCG/ACT inhaler Inhale 2 puffs into the lungs See admin instructions. Every 6 hours as needed for SOB but also takes 2 puffs BID scheduled.    Marland Kitchen albuterol (PROVENTIL) (2.5 MG/3ML) 0.083% nebulizer solution Take 2.5 mg by nebulization every 6 (six) hours as needed for wheezing or shortness of breath.     Marland Kitchen alendronate (FOSAMAX) 70 MG tablet Take 70 mg by mouth every Monday. Take with a full glass of water on an empty stomach.    Marland Kitchen allopurinol (ZYLOPRIM) 100 MG tablet Take 100 mg by mouth daily.     No current facility-administered medications for this visit.     Allergies  Allergen Reactions  . Penicillins Swelling    Has patient had a PCN reaction causing immediate rash, facial/tongue/throat swelling, SOB or lightheadedness with hypotension: Yes Has patient had a PCN reaction causing severe rash involving mucus membranes or skin necrosis: Yes Has patient had a PCN reaction that required hospitalization Yes Has patient had a PCN reaction occurring within the last 10 years: No, more than 10 yrs ago If all of the above answers are "NO", then may proceed with Cephalosporin use.   . Fentanyl Itching  . Peach [Prunus Persica] Hives  . Shellfish Allergy Hives    Social History   Social History  . Marital status: Single    Spouse name: N/A  . Number of children: 5  . Years of education: 12   Occupational History  . Disabled    Social History Main Topics  . Smoking status: Former Research scientist (life sciences)  . Smokeless tobacco: Never Used     Comment: Quit 1980  . Alcohol use No  . Drug use: No  . Sexual activity: Not on file   Other Topics Concern  . Not on file   Social History Narrative   Lives at home alone.   Right-handed.    2-4 cups caffeine daily.     Review of Systems: General: negative for chills, fever, night sweats or weight changes.  Cardiovascular: negative for chest pain, dyspnea on exertion, edema, orthopnea, palpitations, paroxysmal nocturnal dyspnea or shortness of breath Dermatological: negative for rash Respiratory: negative for cough or wheezing Urologic: negative for hematuria Abdominal: negative for nausea, vomiting, diarrhea, bright red blood per rectum, melena, or hematemesis Neurologic: negative for visual changes, syncope, or dizziness All other systems reviewed and are otherwise negative except as noted above.    Blood pressure 126/60, pulse 94,  height 5\' 4"  (1.626 m), weight 222 lb (100.7 kg).  General appearance: alert and no distress Neck: no adenopathy, no carotid bruit, no JVD, supple, symmetrical, trachea midline and thyroid not enlarged, symmetric, no tenderness/mass/nodules Lungs: clear to auscultation bilaterally Heart: regular rate and rhythm, S1, S2 normal, no murmur, click, rub or gallop Extremities: extremities normal, atraumatic, no cyanosis or edema  EKG sinus rhythm at 94 with right bundle branch block and left axis deviation. I personally reviewed this EKG.  ASSESSMENT AND PLAN:   Pulmonary embolism History of pulmonary embolism in the past on Coumadin anticoagulation for short period time multiple he discontinued by her PCP  Chronic diastolic heart failure History of chronic diastolic heart failure with 2-D echo performed 10/29/15 revealing normal LV function, mild LVH with grade 1 diastolic dysfunction and moderate pulmonary hypertension. She does have chronic shortness of breath with COPD and is on oral diuretics.  Hypertension History of hypertension blood pressure measures 126/60. She is on diltiazem, hydralazine, lisinopril, hydrochlorothiazide and atenolol. Continue current medicines at current dosing.  Hyperlipidemia History of hyperlipidemia on  pravastatin followed by her PCP      Lorretta Harp MD [facility], [facility] 09/28/2016 11:37 AM

## 2016-09-28 NOTE — Assessment & Plan Note (Signed)
History of pulmonary embolism in the past on Coumadin anticoagulation for short period time multiple he discontinued by her PCP

## 2016-09-28 NOTE — Patient Instructions (Signed)

## 2016-09-28 NOTE — Assessment & Plan Note (Signed)
History of chronic diastolic heart failure with 2-D echo performed 10/29/15 revealing normal LV function, mild LVH with grade 1 diastolic dysfunction and moderate pulmonary hypertension. She does have chronic shortness of breath with COPD and is on oral diuretics.

## 2016-09-28 NOTE — Assessment & Plan Note (Signed)
History of hypertension blood pressure measures 126/60. She is on diltiazem, hydralazine, lisinopril, hydrochlorothiazide and atenolol. Continue current medicines at current dosing.

## 2016-10-19 IMAGING — CT CT ANGIO CHEST
2 of 9 series · 18 of 46 positions shown · IV contrast (Omni 300)
Comparison: Chest radiograph dated 12/22/2014

CLINICAL DATA: 74-year-old female with shortness of breath and left
arm pain.

EXAM:
CT ANGIOGRAPHY CHEST WITH CONTRAST
TECHNIQUE: Multidetector CT imaging of the chest was performed using the
standard protocol during bolus administration of intravenous
contrast. Multiplanar CT image reconstructions and MIPs were
obtained to evaluate the vascular anatomy.
CONTRAST:  80mL OMNIPAQUE IOHEXOL 350 MG/ML SOLN

[Series 5: thins · axial · 0.60mm/px · z∈[+865,+1089]mm · 15 of 253 slices shown]
[im 15/253  lung]
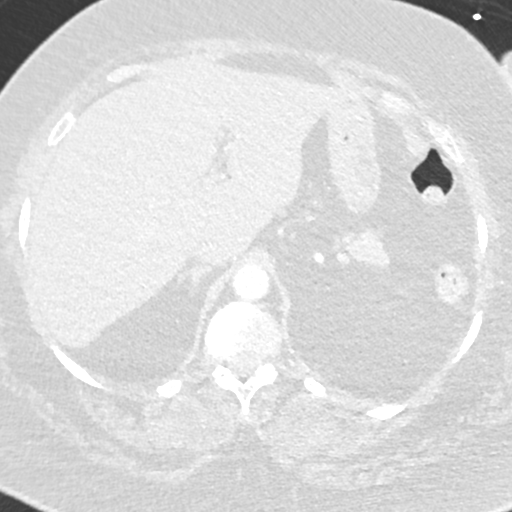
[im 29/253  soft-tissue]
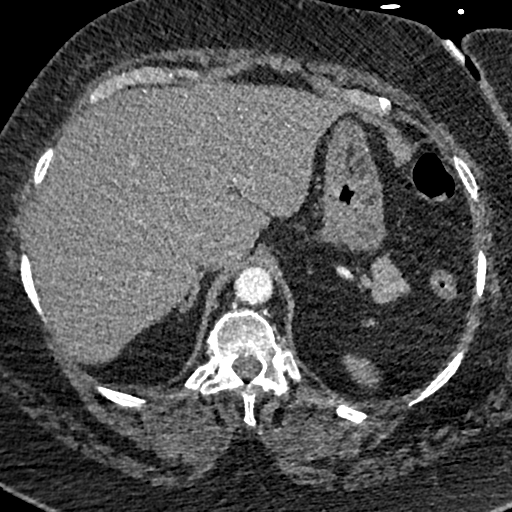
[im 43/253  lung]
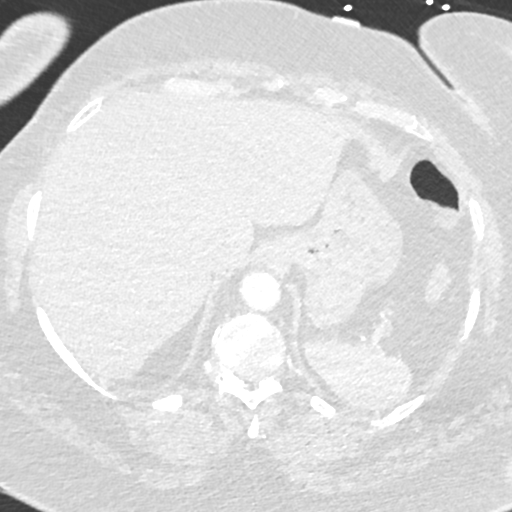
[im 57/253  soft-tissue]
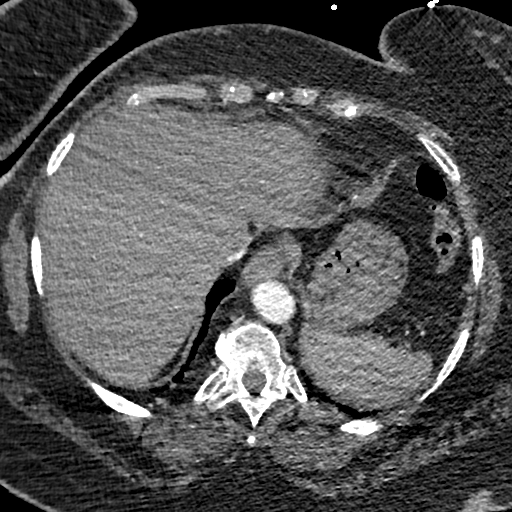
[im 85/253  lung]
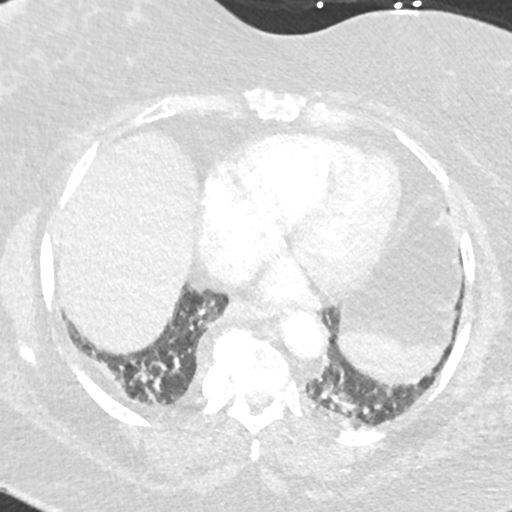
[im 99/253  soft-tissue]
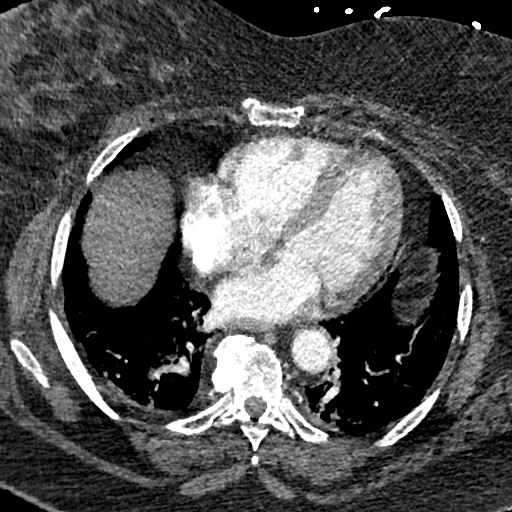
[im 113/253  lung]
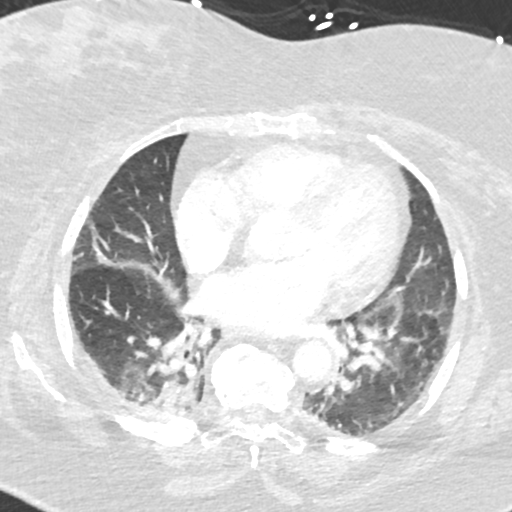
[im 127/253  soft-tissue]
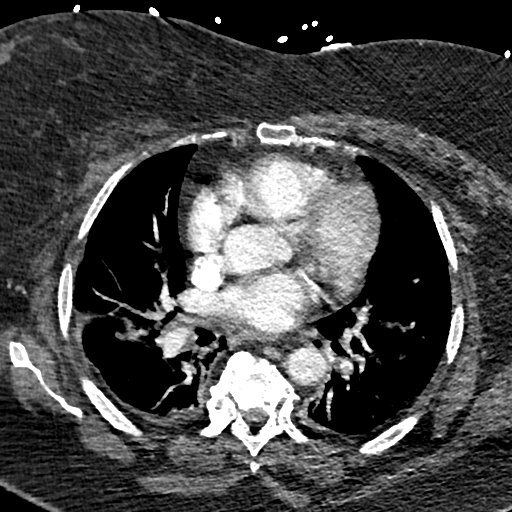
[im 141/253  lung]
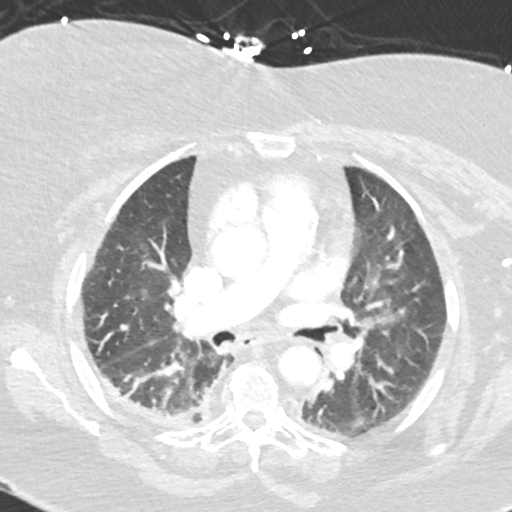
[im 155/253  soft-tissue]
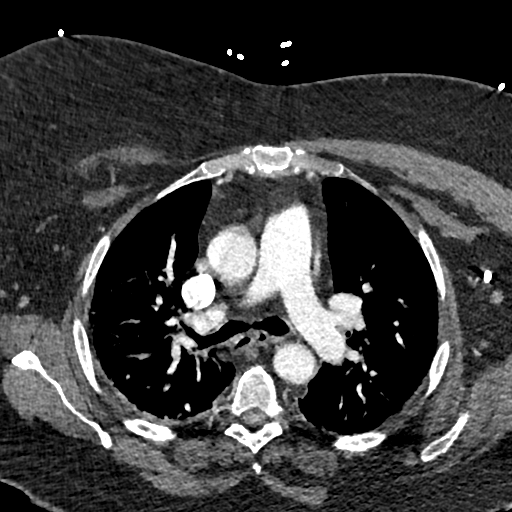
[im 169/253  lung]
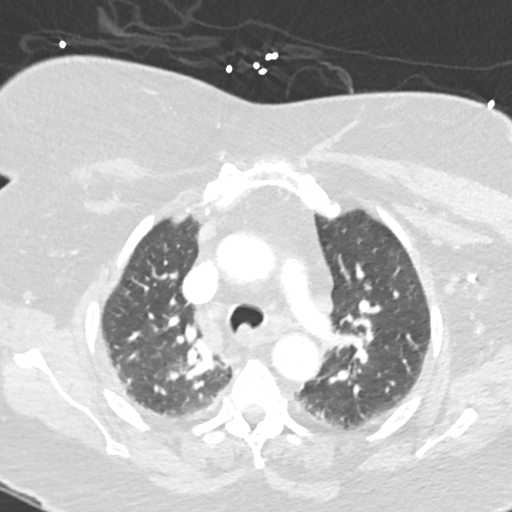
[im 197/253  soft-tissue]
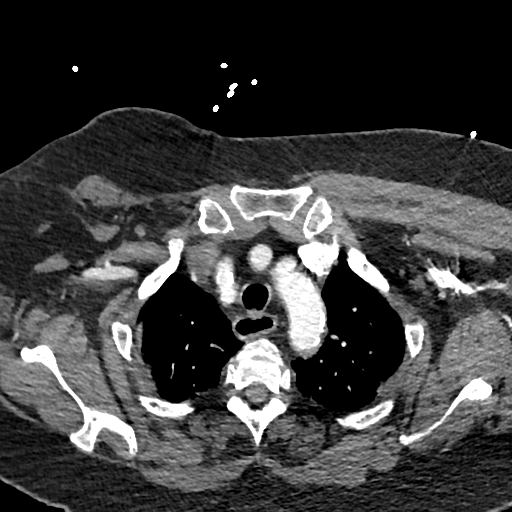
[im 211/253  lung]
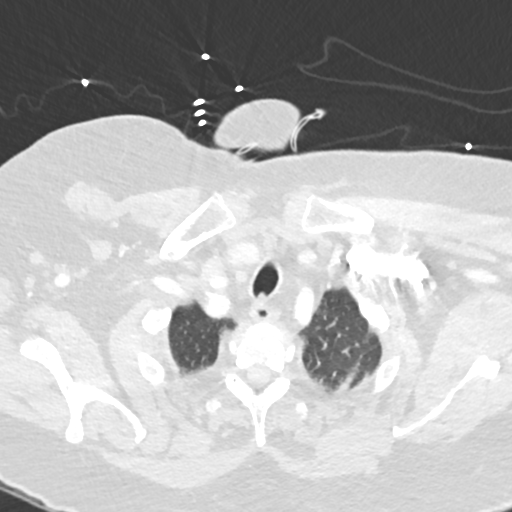
[im 225/253  soft-tissue]
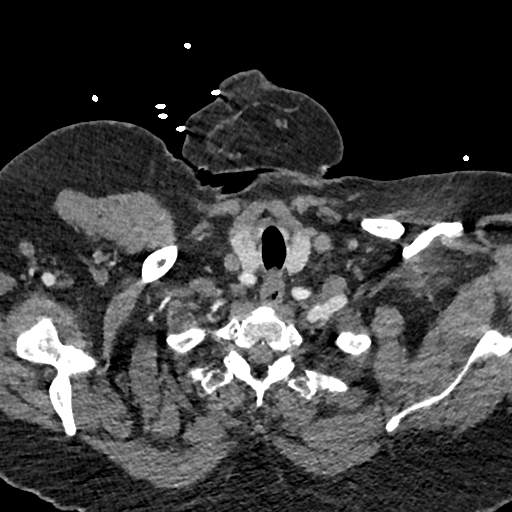
[im 239/253  lung]
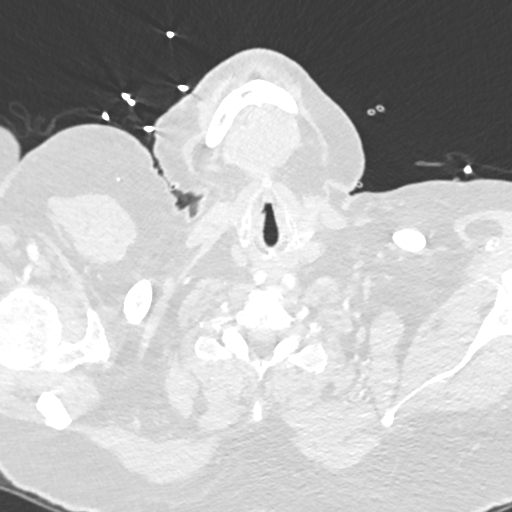

[Series 7: coronal mpr · coronal · 0.53mm/px · 3 of 125 slices shown]
[im 32/125  soft-tissue]
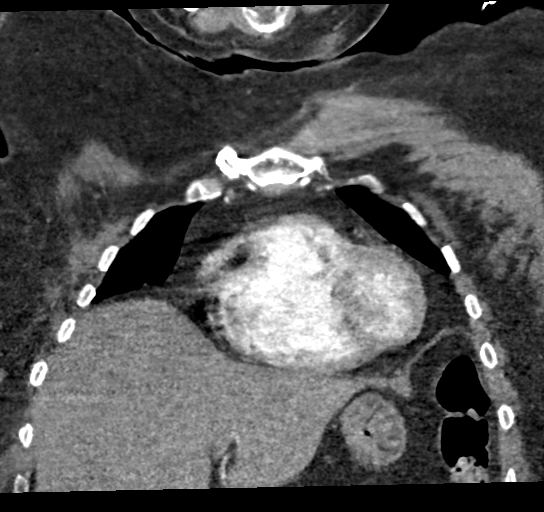
[im 63/125  soft-tissue]
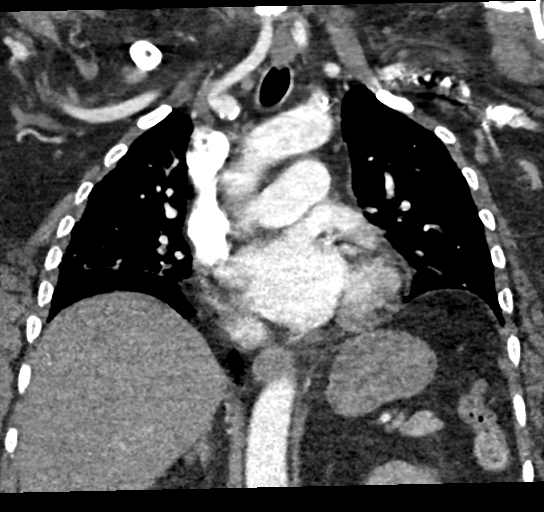
[im 94/125  soft-tissue]
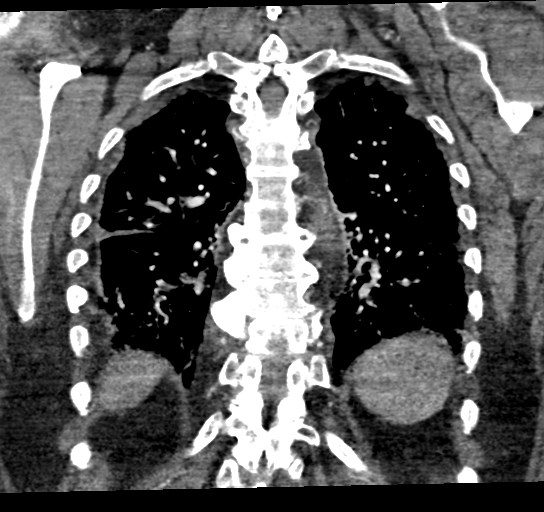

[18 of 46 positions shown; findings below may reference images not displayed]

FINDINGS: Evaluation of the study is limited due to streak artifact caused by
patient's arms as well as suboptimal opacification of the peripheral
branches of the pulmonary arteries.

There is a minimal bibasilar linear atelectasis/ scarring. The lungs
are otherwise clear. There is no pleural effusion or pneumothorax.
The central airways are patent.

Visualized thoracic aorta appears unremarkable. evaluation of the
pulmonary arteries is limited due to suboptimal opacification of the
peripheral branches. No central pulmonary artery embolus identified.
There is mild cardiomegaly. No pericardial effusion. There is
coronary vascular calcification. There is no hilar or mediastinal
adenopathy. The thyroid gland and esophagus appear grossly
unremarkable the

There is no axillary adenopathy. The chest wall soft tissues appear
unremarkable. There is degenerative changes of the spine with
anterior bridging osteophytes at T6-T10. Old healed right posterior
T7 the fracture. There are sclerotic changes of the inferior
endplate of the T3 and superior endplate of the T4, most compatible
with chronic degenerative changes. No acute fracture. The visualized
upper abdomen appear unremarkable.

Review of the MIP images confirms the above findings.
IMPRESSION: No CT evidence of central pulmonary artery embolus.

## 2016-10-19 IMAGING — DX DG ELBOW COMPLETE 3+V*L*
4 series · 4 of 4 positions shown · non-contrast
Comparison: None.

CLINICAL DATA: Acute left elbow pain for 2 days.  No known injury.

EXAM:
LEFT ELBOW - COMPLETE 3+ VIEW

[elbow ap]
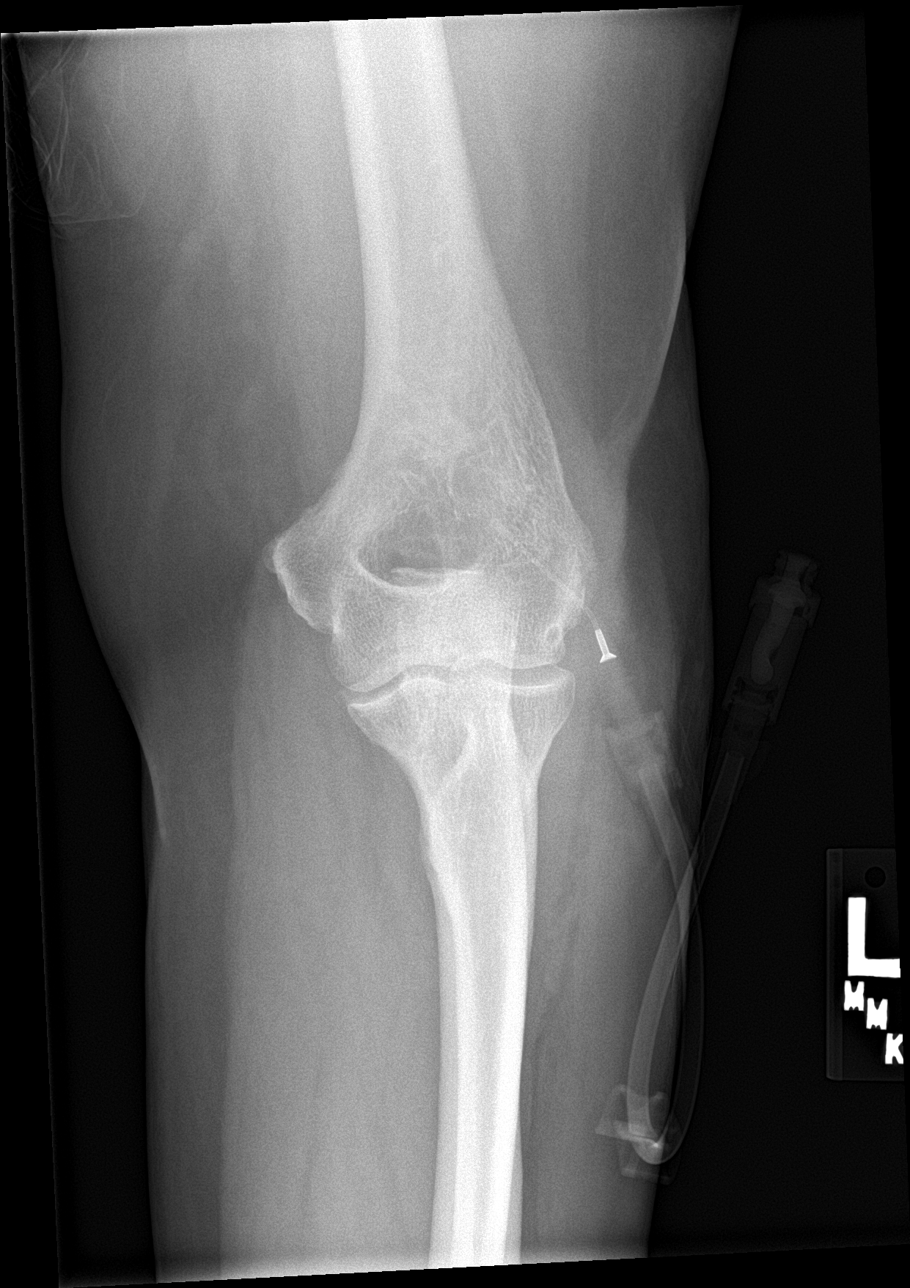

[elbow obl (1 of 2)]
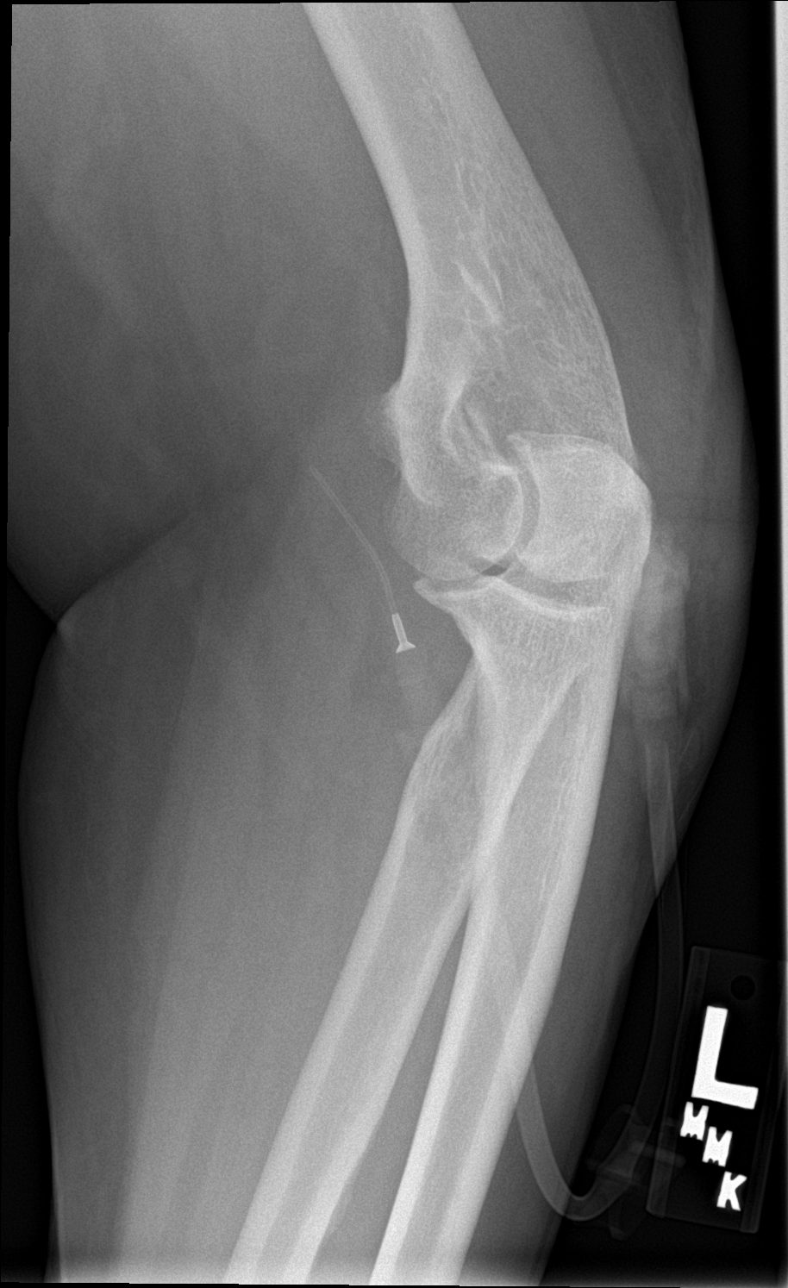

[elbow obl (2 of 2)]
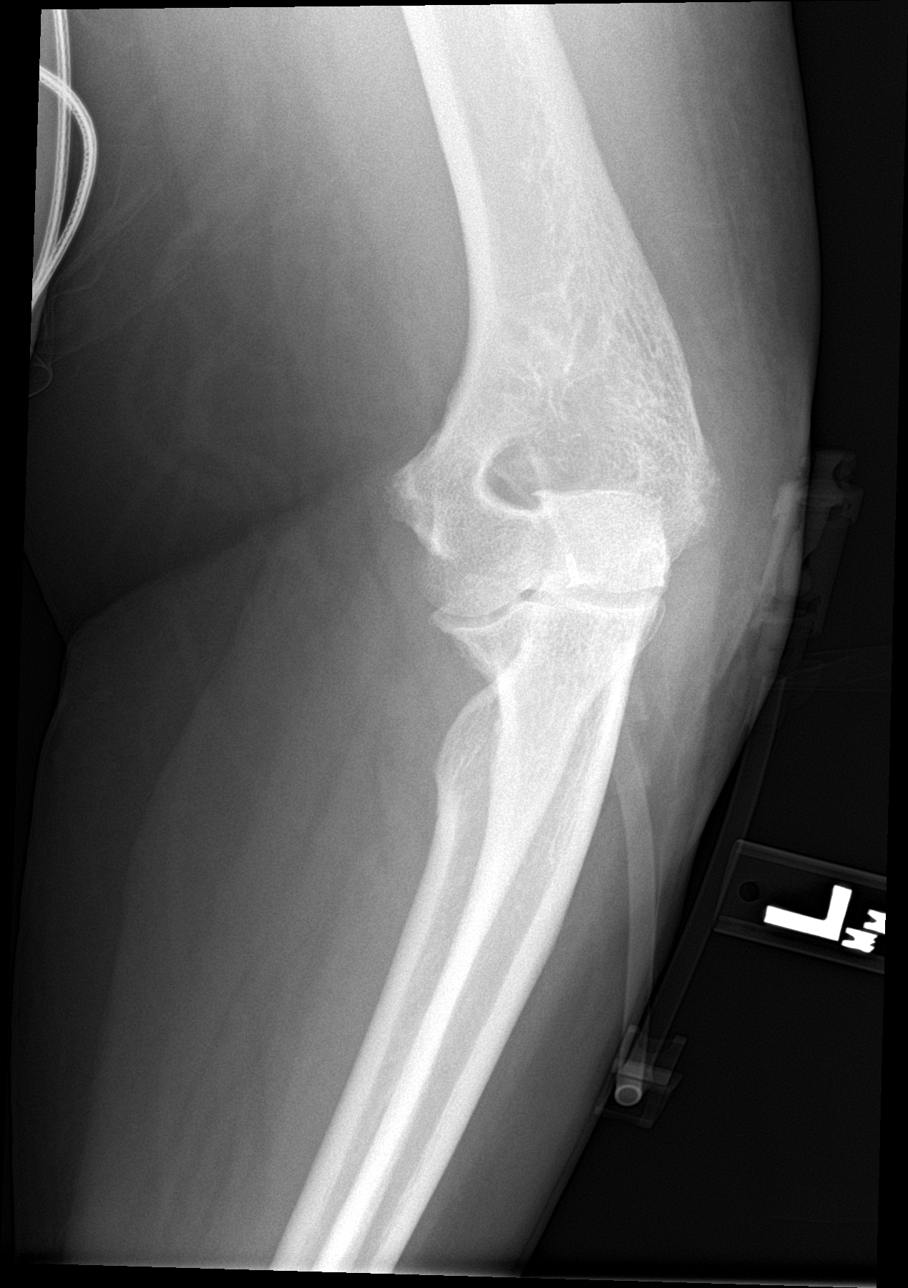

[elbow lat]
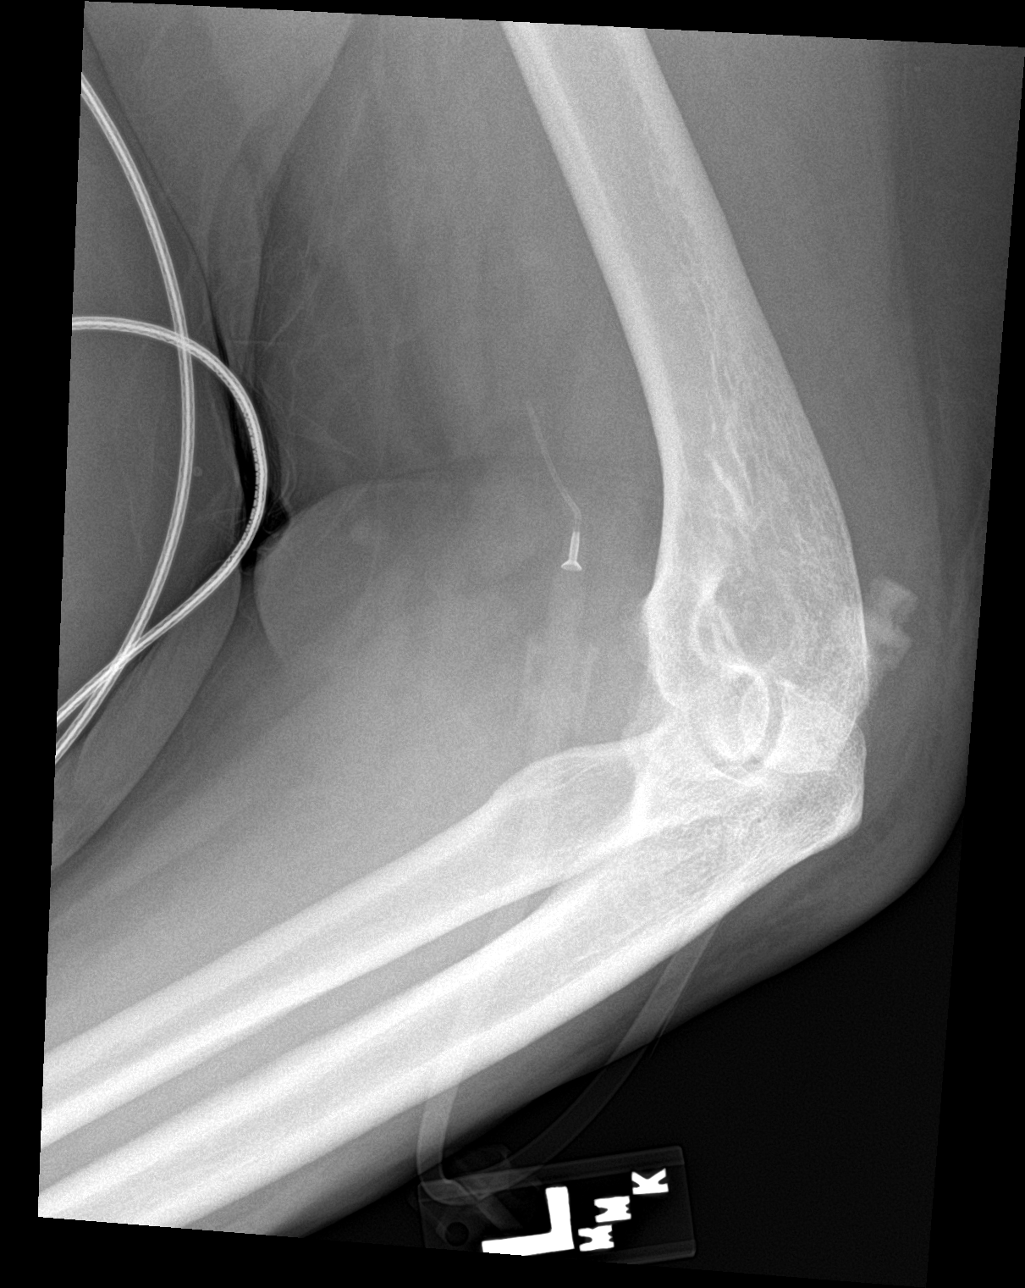

[4 of 4 positions shown; findings below may reference images not displayed]

FINDINGS: Slight limited secondary to patient's inability to position.

There is no evidence of acute fracture, subluxation or dislocation.

The joint space is unremarkable.

No definite effusion is noted.
IMPRESSION: No definite abnormality but slightly limited secondary to patient's
inability to position.

## 2017-02-24 ENCOUNTER — Ambulatory Visit (INDEPENDENT_AMBULATORY_CARE_PROVIDER_SITE_OTHER): Payer: Medicare HMO | Admitting: Specialist

## 2017-03-01 ENCOUNTER — Encounter (INDEPENDENT_AMBULATORY_CARE_PROVIDER_SITE_OTHER): Payer: Self-pay | Admitting: Specialist

## 2017-03-01 ENCOUNTER — Ambulatory Visit (INDEPENDENT_AMBULATORY_CARE_PROVIDER_SITE_OTHER): Payer: Medicare HMO | Admitting: Specialist

## 2017-03-01 VITALS — BP 116/64 | HR 65 | Ht 64.0 in | Wt 236.0 lb

## 2017-03-01 DIAGNOSIS — M47816 Spondylosis without myelopathy or radiculopathy, lumbar region: Secondary | ICD-10-CM

## 2017-03-01 DIAGNOSIS — M48062 Spinal stenosis, lumbar region with neurogenic claudication: Secondary | ICD-10-CM | POA: Diagnosis not present

## 2017-03-01 NOTE — Progress Notes (Signed)
Office Visit Note   Patient: [name] [name]           Date of Birth: 10-Mar-1941           MRN: [id number] Visit Date: 03/01/2017              Requested by: Nolene Ebbs, MD [address] [place],  [id number] PCP: Nolene Ebbs, MD   Assessment & Plan: Visit Diagnoses:  1. Spondylosis without myelopathy or radiculopathy, lumbar region   2. Spinal stenosis of lumbar region with neurogenic claudication   Schedule for repeat bilateral L2 transforamenal ESIs for spinal stenosis at L2-3 above lumbar fusion.   Plan: Avoid bending, stooping and avoid lifting weights greater than 10 lbs. Avoid prolong standing and walking. Avoid frequent bending and stooping  No lifting greater than 10 lbs. May use ice or moist heat for pain. Weight loss is of benefit. Handicap license is approved. Dr. Romona Curls secretary/Assistant will call to arrange for epidural steroid injection   Follow-Up Instructions: Return in about 4 weeks (around 03/29/2017).   Orders:  No orders of the defined types were placed in this encounter.  No orders of the defined types were placed in this encounter.     Procedures: No procedures performed   Clinical Data: No additional findings.   Subjective: Chief Complaint  Patient presents with  . Neck - Follow-up  . Lower Back - Follow-up  . Right Shoulder - Follow-up  . Left Shoulder - Follow-up    76 year old female with back pain and radiation into the legs, she has history of lupus, sleeping difficulty. Has heart MD, Dr. Gwenlyn Found, and he is been following her on  Aspirin qday. Pain in her back is worsening. Present with standing and walking. History of previous DVT, on heparin and then Warfarin then changed to aspirin due to  Fall risk. Almost fell last week and was upset with politics. No bowel or bladder difficulty. Uses a walker, walking to front door then sit in chairs on the porch. She gets stuck  Going to the mail box, it has been moved unable to  get back up the hill. Iliac wing.Pain in the right lower back above the     Review of Systems  Constitutional: Positive for activity change and appetite change. Negative for chills and unexpected weight change.  HENT: Positive for ear pain, hearing loss, rhinorrhea, sinus pain and tinnitus. Negative for congestion, dental problem, drooling, ear discharge, facial swelling, mouth sores, nosebleeds and postnasal drip.   Eyes: Positive for redness.  Respiratory: Positive for cough, shortness of breath and wheezing. Negative for apnea, choking and chest tightness.   Cardiovascular: Positive for leg swelling. Negative for chest pain and palpitations.  Gastrointestinal: Negative.  Negative for abdominal distention, abdominal pain, anal bleeding, blood in stool, constipation, diarrhea, nausea, rectal pain and vomiting.  Endocrine: Negative.   Genitourinary: Negative.  Negative for difficulty urinating, dysuria, enuresis, flank pain, frequency, genital sores, hematuria, menstrual problem and pelvic pain.  Musculoskeletal: Negative for arthralgias, back pain, gait problem and joint swelling.  Skin: Negative.  Negative for color change, pallor, rash and wound.  Allergic/Immunologic: Negative.   Neurological: Positive for dizziness, weakness, light-headedness and headaches. Negative for seizures and numbness.  Hematological: Negative.   Psychiatric/Behavioral: Negative.  Negative for dysphoric mood, hallucinations, self-injury, sleep disturbance and suicidal ideas. The patient is not nervous/anxious and is not hyperactive.      Objective: Vital Signs: BP 116/64 (BP Location: Left Arm, Patient Position:  Sitting)   Pulse 65   Ht 5\' 4"  (1.626 m)   Wt 236 lb (107 kg)   BMI 40.51 kg/m   Physical Exam  Constitutional: She is oriented to person, place, and time. She appears well-developed and well-nourished.  HENT:  Head: Normocephalic and atraumatic.  Eyes: EOM are normal. Pupils are equal, round,  and reactive to light.  Neck: Normal range of motion. Neck supple.  Pulmonary/Chest: Effort normal and breath sounds normal.  Abdominal: Soft. Bowel sounds are normal.  Neurological: She is alert and oriented to person, place, and time.  Skin: Skin is warm and dry.  Psychiatric: She has a normal mood and affect. Her behavior is normal. Judgment and thought content normal.    Back Exam   Tenderness  The patient is experiencing tenderness in the lumbar.  Range of Motion  Extension: abnormal  Flexion: normal  Lateral bend right: normal  Lateral bend left: normal  Rotation right: normal  Rotation left: normal   Muscle Strength  Right Quadriceps:  5/5  Left Quadriceps:  5/5  Right Hamstrings:  5/5  Left Hamstrings:  5/5   Tests  Straight leg raise right: negative Straight leg raise left: negative  Reflexes  Babinski's sign: normal   Other  Toe walk: abnormal Heel walk: abnormal Sensation: decreased Gait: abnormal  Erythema: no back redness Scars: absent  Comments:  Left EHL weakness 4/5, right normal. Previous       Specialty Comments:  No specialty comments available.  Imaging: No results found.   PMFS History: Patient Active Problem List   Diagnosis Date Noted  . Abnormality of gait 08/05/2015  . Spinal stenosis of lumbar region 08/05/2015  . Peripheral neuropathy 08/05/2015  . Chest pain 12/28/2013  . Hyperlipidemia 04/05/2013  . Systemic lupus erythematosus ([place]) 04/05/2013  . Pulmonary embolism ([place]) 01/24/2012  . Chronic diastolic heart failure ([place]) 01/24/2012  . COPD (chronic obstructive pulmonary disease) ([place]) 01/24/2012  . Diabetes mellitus ([place]) 01/24/2012  . Hypertension 01/24/2012  . Leukocytosis 01/24/2012  . Bronchitis 01/24/2012  . Anemia 01/24/2012   Past Medical History:  Diagnosis Date  . Allergic rhinitis   . Anxiety   . Arthritis   . Asthma   . Cancer [facility] 2006   breast cancer right  . CHF (congestive heart failure)  ([place])   . COPD (chronic obstructive pulmonary disease) ([place])   . Degenerative disc disease, lumbar   . Diabetes mellitus without complication ([place])   . Eczema   . GERD (gastroesophageal reflux disease)   . Glaucoma   . History of home oxygen therapy    uses 2 liters ay night and prn  . Hyperlipidemia   . Hypertension   . Low back pain   . Lupus    skin  . Neck pain   . Numbness and tingling   . Obesity   . Osteopenia   . Pulmonary embolism ([place]) 01/2012    CT showed multi small PE and coumadized   . Sleep apnea 2010   no cpap used  . Systemic lupus erythematosus ([facility])     Family History  Problem Relation Age of Onset  . Heart failure Father   . Heart failure Mother   . Diabetes Brother   . Breast cancer Maternal Aunt   . Breast cancer Paternal Aunt     Past Surgical History:  Procedure Laterality Date  . ABDOMINAL HYSTERECTOMY  1976  . BACK SURGERY  2006   lower  . BREAST BIOPSY  Right   . BREAST LUMPECTOMY Right   . Dobtamine myoview  05/02/2008   EF 67% ; LV norm  . DOPPLER ECHOCARDIOGRAPHY  01/25/2012   EF 55 TO 60%; LV norm.  . EXPLORATORY LAPAROTOMY    . EYE SURGERY Bilateral 2010   lens reaplcments for cataracts   . Lower Extrem. venous doppler  01/25/2012    neg.  Marland Kitchen MASTECTOMY     right side  . TOE SURGERY  1996   Bunion   Social History   Occupational History  . Occupation: Disabled  Tobacco Use  . Smoking status: Former Research scientist (life sciences)  . Smokeless tobacco: Never Used  . Tobacco comment: Quit 1980  Substance and Sexual Activity  . Alcohol use: No    Alcohol/week: 0.0 oz  . Drug use: No  . Sexual activity: Not on file

## 2017-03-01 NOTE — Patient Instructions (Signed)
Avoid bending, stooping and avoid lifting weights greater than 10 lbs. Avoid prolong standing and walking. Avoid frequent bending and stooping  No lifting greater than 10 lbs. May use ice or moist heat for pain. Weight loss is of benefit. Handicap license is approved. Dr. Newton's secretary/Assistant will call to arrange for epidural steroid injection  

## 2017-03-03 ENCOUNTER — Encounter: Payer: Self-pay | Admitting: Hematology and Oncology

## 2017-03-03 ENCOUNTER — Telehealth: Payer: Self-pay | Admitting: Hematology and Oncology

## 2017-03-03 NOTE — Telephone Encounter (Signed)
Appt has been scheduled for the pt to see Dr. Lebron Conners on 03/15/2017@11am . Pt's nurse aware to arrive 30 minutes early. Letter mailed to the pt.

## 2017-03-14 ENCOUNTER — Ambulatory Visit (INDEPENDENT_AMBULATORY_CARE_PROVIDER_SITE_OTHER): Payer: Medicare HMO

## 2017-03-14 ENCOUNTER — Ambulatory Visit (INDEPENDENT_AMBULATORY_CARE_PROVIDER_SITE_OTHER): Payer: Medicare HMO | Admitting: Physical Medicine and Rehabilitation

## 2017-03-14 ENCOUNTER — Encounter (INDEPENDENT_AMBULATORY_CARE_PROVIDER_SITE_OTHER): Payer: Self-pay | Admitting: Physical Medicine and Rehabilitation

## 2017-03-14 VITALS — BP 157/90 | HR 95 | Temp 97.5°F

## 2017-03-14 DIAGNOSIS — M5416 Radiculopathy, lumbar region: Secondary | ICD-10-CM

## 2017-03-14 MED ORDER — BETAMETHASONE SOD PHOS & ACET 6 (3-3) MG/ML IJ SUSP
12.0000 mg | Freq: Once | INTRAMUSCULAR | Status: AC
  Administered 2017-03-14: 12 mg

## 2017-03-14 MED ORDER — LIDOCAINE HCL (PF) 1 % IJ SOLN
2.0000 mL | Freq: Once | INTRAMUSCULAR | Status: AC
  Administered 2017-03-14: 2 mL

## 2017-03-14 NOTE — Progress Notes (Deleted)
Pt here for ESI of lower back, pain scale 10/10, hurts worse on left side and radiates to shoulder left. Pt is taking tylenol for the pain, helps sometimes. Hurts worse if moves or bends too much. Pt has driver, pt allergic to contrast dye. Not taking any blood thinners but does take baby aspirin daily.

## 2017-03-14 NOTE — Patient Instructions (Signed)

## 2017-03-15 ENCOUNTER — Telehealth: Payer: Self-pay | Admitting: Hematology and Oncology

## 2017-03-15 ENCOUNTER — Encounter: Payer: Self-pay | Admitting: Hematology and Oncology

## 2017-03-15 ENCOUNTER — Other Ambulatory Visit: Payer: Self-pay

## 2017-03-15 ENCOUNTER — Ambulatory Visit (HOSPITAL_BASED_OUTPATIENT_CLINIC_OR_DEPARTMENT_OTHER): Payer: Medicare HMO | Admitting: Hematology and Oncology

## 2017-03-15 ENCOUNTER — Ambulatory Visit (HOSPITAL_BASED_OUTPATIENT_CLINIC_OR_DEPARTMENT_OTHER): Payer: Medicare HMO

## 2017-03-15 VITALS — BP 134/63 | HR 84 | Temp 99.4°F | Resp 21 | Ht 64.0 in | Wt 245.7 lb

## 2017-03-15 DIAGNOSIS — D472 Monoclonal gammopathy: Secondary | ICD-10-CM

## 2017-03-15 DIAGNOSIS — M3500 Sicca syndrome, unspecified: Secondary | ICD-10-CM | POA: Diagnosis not present

## 2017-03-15 DIAGNOSIS — G629 Polyneuropathy, unspecified: Secondary | ICD-10-CM

## 2017-03-15 DIAGNOSIS — R269 Unspecified abnormalities of gait and mobility: Secondary | ICD-10-CM

## 2017-03-15 DIAGNOSIS — E119 Type 2 diabetes mellitus without complications: Secondary | ICD-10-CM

## 2017-03-15 DIAGNOSIS — Z853 Personal history of malignant neoplasm of breast: Secondary | ICD-10-CM

## 2017-03-15 DIAGNOSIS — R42 Dizziness and giddiness: Secondary | ICD-10-CM

## 2017-03-15 DIAGNOSIS — M329 Systemic lupus erythematosus, unspecified: Secondary | ICD-10-CM | POA: Diagnosis not present

## 2017-03-15 DIAGNOSIS — N183 Chronic kidney disease, stage 3 (moderate): Secondary | ICD-10-CM | POA: Diagnosis not present

## 2017-03-15 DIAGNOSIS — Z86711 Personal history of pulmonary embolism: Secondary | ICD-10-CM | POA: Diagnosis not present

## 2017-03-15 DIAGNOSIS — R209 Unspecified disturbances of skin sensation: Secondary | ICD-10-CM | POA: Diagnosis not present

## 2017-03-15 LAB — COMPREHENSIVE METABOLIC PANEL
ALT: 18 U/L (ref 0–55)
AST: 22 U/L (ref 5–34)
Albumin: 3.6 g/dL (ref 3.5–5.0)
Alkaline Phosphatase: 95 U/L (ref 40–150)
Anion Gap: 11 mEq/L (ref 3–11)
BILIRUBIN TOTAL: 0.24 mg/dL (ref 0.20–1.20)
BUN: 36.7 mg/dL — ABNORMAL HIGH (ref 7.0–26.0)
CHLORIDE: 106 meq/L (ref 98–109)
CO2: 23 meq/L (ref 22–29)
Calcium: 9.8 mg/dL (ref 8.4–10.4)
Creatinine: 2 mg/dL — ABNORMAL HIGH (ref 0.6–1.1)
EGFR: 28 mL/min/{1.73_m2} — AB (ref >60)
GLUCOSE: 123 mg/dL (ref 70–140)
POTASSIUM: 4.3 meq/L (ref 3.5–5.1)
SODIUM: 140 meq/L (ref 136–145)
TOTAL PROTEIN: 7.8 g/dL (ref 6.4–8.3)

## 2017-03-15 LAB — CBC WITH DIFFERENTIAL/PLATELET
BASO%: 1 % (ref 0.0–2.0)
Basophils Absolute: 0.1 10*3/uL (ref 0.0–0.1)
EOS%: 0.1 % (ref 0.0–7.0)
Eosinophils Absolute: 0 10*3/uL (ref 0.0–0.5)
HEMATOCRIT: 33.1 % — AB (ref 34.8–46.6)
HGB: 10.9 g/dL — ABNORMAL LOW (ref 11.6–15.9)
LYMPH#: 1.4 10*3/uL (ref 0.9–3.3)
LYMPH%: 10.3 % — ABNORMAL LOW (ref 14.0–49.7)
MCH: 28.4 pg (ref 25.1–34.0)
MCHC: 32.8 g/dL (ref 31.5–36.0)
MCV: 86.4 fL (ref 79.5–101.0)
MONO#: 0.5 10*3/uL (ref 0.1–0.9)
MONO%: 3.9 % (ref 0.0–14.0)
NEUT#: 11.9 10*3/uL — ABNORMAL HIGH (ref 1.5–6.5)
NEUT%: 84.7 % — AB (ref 38.4–76.8)
PLATELETS: 316 10*3/uL (ref 145–400)
RBC: 3.83 10*6/uL (ref 3.70–5.45)
RDW: 15 % — ABNORMAL HIGH (ref 11.2–14.5)
WBC: 14 10*3/uL — ABNORMAL HIGH (ref 3.9–10.3)

## 2017-03-15 LAB — URIC ACID: Uric Acid, Serum: 10.3 mg/dl — ABNORMAL HIGH (ref 2.6–7.4)

## 2017-03-15 LAB — LACTATE DEHYDROGENASE: LDH: 371 U/L — ABNORMAL HIGH (ref 125–245)

## 2017-03-15 NOTE — Telephone Encounter (Signed)
Gave avs and calendar for December  °

## 2017-03-16 LAB — BETA 2 MICROGLOBULIN, SERUM: Beta-2: 4.5 mg/L — ABNORMAL HIGH (ref 0.6–2.4)

## 2017-03-16 LAB — KAPPA/LAMBDA LIGHT CHAINS
IG KAPPA FREE LIGHT CHAIN: 27.2 mg/L — AB (ref 3.3–19.4)
IG LAMBDA FREE LIGHT CHAIN: 17 mg/L (ref 5.7–26.3)
Kappa/Lambda FluidC Ratio: 1.6 (ref 0.26–1.65)

## 2017-03-20 LAB — MULTIPLE MYELOMA PANEL, SERUM
ALBUMIN/GLOB SERPL: 0.9 (ref 0.7–1.7)
Albumin SerPl Elph-Mcnc: 3.3 g/dL (ref 2.9–4.4)
Alpha 1: 0.2 g/dL (ref 0.0–0.4)
Alpha2 Glob SerPl Elph-Mcnc: 1.1 g/dL — ABNORMAL HIGH (ref 0.4–1.0)
B-Globulin SerPl Elph-Mcnc: 1.3 g/dL (ref 0.7–1.3)
GAMMA GLOB SERPL ELPH-MCNC: 1.4 g/dL (ref 0.4–1.8)
GLOBULIN, TOTAL: 4 g/dL — AB (ref 2.2–3.9)
IGA/IMMUNOGLOBULIN A, SERUM: 171 mg/dL (ref 64–422)
IgG, Qn, Serum: 1520 mg/dL (ref 700–1600)
IgM, Qn, Serum: 73 mg/dL (ref 26–217)
M Protein SerPl Elph-Mcnc: 0.7 g/dL — ABNORMAL HIGH
Total Protein: 7.3 g/dL (ref 6.0–8.5)

## 2017-03-21 ENCOUNTER — Ambulatory Visit (HOSPITAL_BASED_OUTPATIENT_CLINIC_OR_DEPARTMENT_OTHER): Payer: Medicare Other | Admitting: Hematology and Oncology

## 2017-03-21 ENCOUNTER — Telehealth: Payer: Self-pay | Admitting: Hematology and Oncology

## 2017-03-21 ENCOUNTER — Encounter: Payer: Self-pay | Admitting: Hematology and Oncology

## 2017-03-21 VITALS — BP 140/57 | HR 89 | Temp 98.0°F | Resp 20 | Wt 249.0 lb

## 2017-03-21 DIAGNOSIS — N183 Chronic kidney disease, stage 3 (moderate): Secondary | ICD-10-CM | POA: Diagnosis not present

## 2017-03-21 DIAGNOSIS — Z86711 Personal history of pulmonary embolism: Secondary | ICD-10-CM

## 2017-03-21 DIAGNOSIS — I509 Heart failure, unspecified: Secondary | ICD-10-CM

## 2017-03-21 DIAGNOSIS — M329 Systemic lupus erythematosus, unspecified: Secondary | ICD-10-CM | POA: Diagnosis not present

## 2017-03-21 DIAGNOSIS — G629 Polyneuropathy, unspecified: Secondary | ICD-10-CM

## 2017-03-21 DIAGNOSIS — E119 Type 2 diabetes mellitus without complications: Secondary | ICD-10-CM

## 2017-03-21 DIAGNOSIS — M3500 Sicca syndrome, unspecified: Secondary | ICD-10-CM

## 2017-03-21 DIAGNOSIS — D472 Monoclonal gammopathy: Secondary | ICD-10-CM

## 2017-03-21 DIAGNOSIS — Z853 Personal history of malignant neoplasm of breast: Secondary | ICD-10-CM | POA: Diagnosis not present

## 2017-03-21 DIAGNOSIS — I10 Essential (primary) hypertension: Secondary | ICD-10-CM | POA: Diagnosis not present

## 2017-03-21 NOTE — Telephone Encounter (Signed)
Scheduled appt per 12/4 los - Gave patient AVS and calender per [facility] radiology to contact patient with biopsy schedule.

## 2017-03-22 NOTE — Procedures (Signed)
Mrs. [name] [name] is a 76 year old female who comes in today for planned bilateral L2 transforaminal epidural steroid injection requested by Dr. Louanne Skye who follows her for her back pain.  She reports 10 out of 10 left more than right back pain she reports the pain radiates to her left shoulder in a nondermatomal fashion.  She reports not getting any relief from medications or activity modifications or any other treatments at this point.  She endorses an allergy to contrast dye.  It appears that this was a reaction during an IV dye study a few years ago and it was not an anaphylactic reaction and she is really unsure of exactly what reaction she had.  Her facial allergies are not listed as contrast dye allergy but shellfish allergy which is really not noted to be cross allergic.  We are going to use a very small amount of contrast injection.  Lumbosacral Transforaminal Epidural Steroid Injection - Sub-Pedicular Approach with Fluoroscopic Guidance  Patient: [name] [name]      Date of Birth: [birth year] MRN: [id number] PCP: Nolene Ebbs, MD      Visit Date: 03/14/2017   Universal Protocol:    Date/Time: 03/14/2017  Consent Given By: the patient  Position: PRONE  Additional Comments: Vital signs were monitored before and after the procedure. Patient was prepped and draped in the usual sterile fashion. The correct patient, procedure, and site was verified.   Injection Procedure Details:  Procedure Site One Meds Administered:  Meds ordered this encounter  Medications  . lidocaine (PF) (XYLOCAINE) 1 % injection 2 mL  . betamethasone acetate-betamethasone sodium phosphate (CELESTONE) injection 12 mg    Laterality: Bilateral  Location/Site:  L2-L3  Needle size: 22 G  Needle type: Spinal  Needle Placement: Transforaminal  Findings:    -Comments: Excellent flow of contrast along the nerve and into the epidural space.  Procedure Details: After squaring off the end-plates to get a true AP  view, the C-arm was positioned so that an oblique view of the foramen as noted above was visualized. The target area is just inferior to the "nose of the scotty dog" or sub pedicular. The soft tissues overlying this structure were infiltrated with 2-3 ml. of 1% Lidocaine without Epinephrine.  The spinal needle was inserted toward the target using a "trajectory" view along the fluoroscope beam.  Under AP and lateral visualization, the needle was advanced so it did not puncture dura and was located close the 6 O'Clock position of the pedical in AP tracterory. Biplanar projections were used to confirm position. Aspiration was confirmed to be negative for CSF and/or blood. A 1-2 ml. volume of Isovue-250 was injected and flow of contrast was noted at each level. Radiographs were obtained for documentation purposes.   After attaining the desired flow of contrast documented above, a 0.5 to 1.0 ml test dose of 0.25% Marcaine was injected into each respective transforaminal space.  The patient was observed for 90 seconds post injection.  After no sensory deficits were reported, and normal lower extremity motor function was noted,   the above injectate was administered so that equal amounts of the injectate were placed at each foramen (level) into the transforaminal epidural space.   Additional Comments:  The patient tolerated the procedure well Dressing: Band-Aid    Post-procedure details: Patient was observed during the procedure. Post-procedure instructions were reviewed.  Patient left the clinic in stable condition.

## 2017-03-23 ENCOUNTER — Ambulatory Visit (HOSPITAL_COMMUNITY)
Admission: RE | Admit: 2017-03-23 | Discharge: 2017-03-23 | Disposition: A | Discharge status: Home / Self Care | Payer: Medicare Other | Source: Ambulatory Visit | Attending: Hematology and Oncology | Admitting: Hematology and Oncology

## 2017-03-23 DIAGNOSIS — D472 Monoclonal gammopathy: Secondary | ICD-10-CM | POA: Diagnosis not present

## 2017-03-29 DIAGNOSIS — N184 Chronic kidney disease, stage 4 (severe): Secondary | ICD-10-CM | POA: Diagnosis not present

## 2017-03-29 DIAGNOSIS — N183 Chronic kidney disease, stage 3 (moderate): Secondary | ICD-10-CM | POA: Diagnosis not present

## 2017-04-03 ENCOUNTER — Other Ambulatory Visit: Payer: Self-pay | Admitting: General Surgery

## 2017-04-04 ENCOUNTER — Encounter (HOSPITAL_COMMUNITY): Payer: Self-pay | Admitting: *Deleted

## 2017-04-04 ENCOUNTER — Ambulatory Visit (HOSPITAL_COMMUNITY)
Admission: RE | Admit: 2017-04-04 | Discharge: 2017-04-04 | Disposition: A | Discharge status: Home / Self Care | Payer: Medicare Other | Source: Ambulatory Visit | Attending: Hematology and Oncology | Admitting: Hematology and Oncology

## 2017-04-04 DIAGNOSIS — Z853 Personal history of malignant neoplasm of breast: Secondary | ICD-10-CM | POA: Diagnosis not present

## 2017-04-04 DIAGNOSIS — D72822 Plasmacytosis: Secondary | ICD-10-CM | POA: Insufficient documentation

## 2017-04-04 DIAGNOSIS — D472 Monoclonal gammopathy: Secondary | ICD-10-CM | POA: Insufficient documentation

## 2017-04-04 DIAGNOSIS — I509 Heart failure, unspecified: Secondary | ICD-10-CM | POA: Diagnosis not present

## 2017-04-04 DIAGNOSIS — M1990 Unspecified osteoarthritis, unspecified site: Secondary | ICD-10-CM | POA: Diagnosis not present

## 2017-04-04 DIAGNOSIS — Z79899 Other long term (current) drug therapy: Secondary | ICD-10-CM | POA: Insufficient documentation

## 2017-04-04 DIAGNOSIS — Z86711 Personal history of pulmonary embolism: Secondary | ICD-10-CM | POA: Insufficient documentation

## 2017-04-04 DIAGNOSIS — Z7951 Long term (current) use of inhaled steroids: Secondary | ICD-10-CM | POA: Insufficient documentation

## 2017-04-04 DIAGNOSIS — J449 Chronic obstructive pulmonary disease, unspecified: Secondary | ICD-10-CM | POA: Diagnosis not present

## 2017-04-04 DIAGNOSIS — Z6841 Body Mass Index (BMI) 40.0 and over, adult: Secondary | ICD-10-CM | POA: Insufficient documentation

## 2017-04-04 DIAGNOSIS — Z794 Long term (current) use of insulin: Secondary | ICD-10-CM | POA: Insufficient documentation

## 2017-04-04 DIAGNOSIS — H409 Unspecified glaucoma: Secondary | ICD-10-CM | POA: Insufficient documentation

## 2017-04-04 DIAGNOSIS — Z90710 Acquired absence of both cervix and uterus: Secondary | ICD-10-CM | POA: Insufficient documentation

## 2017-04-04 DIAGNOSIS — Z7983 Long term (current) use of bisphosphonates: Secondary | ICD-10-CM | POA: Insufficient documentation

## 2017-04-04 DIAGNOSIS — E785 Hyperlipidemia, unspecified: Secondary | ICD-10-CM | POA: Insufficient documentation

## 2017-04-04 DIAGNOSIS — I110 Hypertensive heart disease with heart failure: Secondary | ICD-10-CM | POA: Diagnosis not present

## 2017-04-04 DIAGNOSIS — E119 Type 2 diabetes mellitus without complications: Secondary | ICD-10-CM | POA: Diagnosis not present

## 2017-04-04 DIAGNOSIS — K219 Gastro-esophageal reflux disease without esophagitis: Secondary | ICD-10-CM | POA: Insufficient documentation

## 2017-04-04 DIAGNOSIS — D649 Anemia, unspecified: Secondary | ICD-10-CM | POA: Insufficient documentation

## 2017-04-04 DIAGNOSIS — Z7982 Long term (current) use of aspirin: Secondary | ICD-10-CM | POA: Insufficient documentation

## 2017-04-04 DIAGNOSIS — Z87891 Personal history of nicotine dependence: Secondary | ICD-10-CM | POA: Insufficient documentation

## 2017-04-04 DIAGNOSIS — G4730 Sleep apnea, unspecified: Secondary | ICD-10-CM | POA: Insufficient documentation

## 2017-04-04 DIAGNOSIS — E669 Obesity, unspecified: Secondary | ICD-10-CM | POA: Diagnosis not present

## 2017-04-04 DIAGNOSIS — M8580 Other specified disorders of bone density and structure, unspecified site: Secondary | ICD-10-CM | POA: Diagnosis not present

## 2017-04-04 DIAGNOSIS — Z8249 Family history of ischemic heart disease and other diseases of the circulatory system: Secondary | ICD-10-CM | POA: Insufficient documentation

## 2017-04-04 DIAGNOSIS — Z880 Allergy status to penicillin: Secondary | ICD-10-CM | POA: Insufficient documentation

## 2017-04-04 DIAGNOSIS — M329 Systemic lupus erythematosus, unspecified: Secondary | ICD-10-CM | POA: Diagnosis not present

## 2017-04-04 DIAGNOSIS — Z9981 Dependence on supplemental oxygen: Secondary | ICD-10-CM | POA: Diagnosis not present

## 2017-04-04 DIAGNOSIS — Z9011 Acquired absence of right breast and nipple: Secondary | ICD-10-CM | POA: Insufficient documentation

## 2017-04-04 DIAGNOSIS — Z91013 Allergy to seafood: Secondary | ICD-10-CM | POA: Insufficient documentation

## 2017-04-04 DIAGNOSIS — Z833 Family history of diabetes mellitus: Secondary | ICD-10-CM | POA: Insufficient documentation

## 2017-04-04 HISTORY — DX: Dyspnea, unspecified: R06.00

## 2017-04-04 LAB — CBC WITH DIFFERENTIAL/PLATELET
BASOS PCT: 0 %
Basophils Absolute: 0 10*3/uL (ref 0.0–0.1)
EOS ABS: 0.2 10*3/uL (ref 0.0–0.7)
Eosinophils Relative: 3 %
HCT: 33.1 % — ABNORMAL LOW (ref 36.0–46.0)
Hemoglobin: 10.8 g/dL — ABNORMAL LOW (ref 12.0–15.0)
Lymphocytes Relative: 24 %
Lymphs Abs: 2.1 10*3/uL (ref 0.7–4.0)
MCH: 28.9 pg (ref 26.0–34.0)
MCHC: 32.6 g/dL (ref 30.0–36.0)
MCV: 88.5 fL (ref 78.0–100.0)
MONO ABS: 0.3 10*3/uL (ref 0.1–1.0)
MONOS PCT: 4 %
NEUTROS ABS: 6.1 10*3/uL (ref 1.7–7.7)
NEUTROS PCT: 69 %
Platelets: 316 10*3/uL (ref 150–400)
RBC: 3.74 MIL/uL — ABNORMAL LOW (ref 3.87–5.11)
RDW: 15 % (ref 11.5–15.5)
WBC: 8.7 10*3/uL (ref 4.0–10.5)

## 2017-04-04 LAB — PROTIME-INR
INR: 0.99
PROTHROMBIN TIME: 13 s (ref 11.4–15.2)

## 2017-04-04 LAB — GLUCOSE, CAPILLARY: Glucose-Capillary: 131 mg/dL — ABNORMAL HIGH (ref 65–99)

## 2017-04-04 MED ORDER — MIDAZOLAM HCL 2 MG/2ML IJ SOLN
INTRAMUSCULAR | Status: AC | PRN
  Administered 2017-04-04 (×2): 1 mg via INTRAVENOUS

## 2017-04-04 MED ORDER — HYDROCODONE-ACETAMINOPHEN 5-325 MG PO TABS: 1.0000 | ORAL_TABLET | ORAL | Status: DC | PRN

## 2017-04-04 MED ORDER — FENTANYL CITRATE (PF) 100 MCG/2ML IJ SOLN
INTRAMUSCULAR | Status: AC
  Filled 2017-04-04: qty 2

## 2017-04-04 MED ORDER — MIDAZOLAM HCL 2 MG/2ML IJ SOLN
INTRAMUSCULAR | Status: AC
  Filled 2017-04-04: qty 2

## 2017-04-04 MED ORDER — SODIUM CHLORIDE 0.9 % IV SOLN
INTRAVENOUS | Status: DC
  Administered 2017-04-04: 07:00:00 via INTRAVENOUS

## 2017-04-04 NOTE — Procedures (Signed)
CT guided bone marrow biopsy.  2 aspirates and 1 core.  No immediate complication.  Minimal blood loss.  

## 2017-04-04 NOTE — Discharge Instructions (Signed)
Moderate Conscious Sedation, Adult, Care After These instructions provide you with information about caring for yourself after your procedure. Your health care provider may also give you more specific instructions. Your treatment has been planned according to current medical practices, but problems sometimes occur. Call your health care provider if you have any problems or questions after your procedure. What can I expect after the procedure? After your procedure, it is common:  To feel sleepy for several hours.  To feel clumsy and have poor balance for several hours.  To have poor judgment for several hours.  To vomit if you eat too soon.  Follow these instructions at home: For at least 24 hours after the procedure:   Do not: ? Participate in activities where you could fall or become injured. ? Drive. ? Use heavy machinery. ? Drink alcohol. ? Take sleeping pills or medicines that cause drowsiness. ? Make important decisions or sign legal documents. ? Take care of children on your own.  Rest. Eating and drinking  Follow the diet recommended by your health care provider.  If you vomit: ? Drink water, juice, or soup when you can drink without vomiting. ? Make sure you have little or no nausea before eating solid foods. General instructions  Have a responsible adult stay with you until you are awake and alert.  Take over-the-counter and prescription medicines only as told by your health care provider.  If you smoke, do not smoke without supervision.  Keep all follow-up visits as told by your health care provider. This is important. Contact a health care provider if:  You keep feeling nauseous or you keep vomiting.  You feel light-headed.  You develop a rash.  You have a fever. Get help right away if:  You have trouble breathing. This information is not intended to replace advice given to you by your health care provider. Make sure you discuss any questions you have  with your health care provider. Document Released: 01/23/2013 Document Revised: 09/07/2015 Document Reviewed: 07/25/2015 Elsevier Interactive Patient Education  2018 [place].   Bone Marrow Aspiration and Bone Marrow Biopsy, Adult, Care After This sheet gives you information about how to care for yourself after your procedure. Your health care provider may also give you more specific instructions. If you have problems or questions, contact your health care provider. What can I expect after the procedure? After the procedure, it is common to have:  Mild pain and tenderness.  Swelling.  Bruising.  Follow these instructions at home:  Take over-the-counter or prescription medicines only as told by your health care provider.  Do not take baths, swim, or use a hot tub until your health care provider approves. Ask if you can take a shower or have a sponge bath.  You may shower tomorrow.  Follow instructions from your health care provider about how to take care of the puncture site. Make sure you: ? Wash your hands with soap and water before you change your bandage (dressing). If soap and water are not available, use hand sanitizer. ? Change your dressing as told by your health care provider.  You may remove your dressing tomorrow.  Check your puncture siteevery day for signs of infection. Check for: ? More redness, swelling, or pain. ? More fluid or blood. ? Warmth. ? Pus or a bad smell.  Return to your normal activities as told by your health care provider. Ask your health care provider what activities are safe for you.  Do not drive  for 24 hours if you were given a medicine to help you relax (sedative). °· Keep all follow-up visits as told by your health care provider. This is important. °Contact a health care provider if: °· You have more redness, swelling, or pain around the puncture site. °· You have more fluid or blood coming from the puncture site. °· Your puncture site feels  warm to the touch. °· You have pus or a bad smell coming from the puncture site. °· You have a fever. °· Your pain is not controlled with medicine. °This information is not intended to replace advice given to you by your health care provider. Make sure you discuss any questions you have with your health care provider. °Document Released: 10/22/2004 Document Revised: 10/23/2015 Document Reviewed: 09/16/2015 °Elsevier Interactive Patient Education © 2018 [employer] ° °

## 2017-04-04 NOTE — Consult Note (Signed)
Chief Complaint: Patient was seen in consultation today for CT-guided bone marrow biopsy  Referring Physician(s): Perlov,Mikhail G  Supervising Physician: Markus Daft  Patient Status: [facility] - Out-pt  History of Present Illness: [name] [name] is a 76 y.o. female with remote history of breast cancer and now with MGUS and recent bone survey revealing multiple very tiny lucencies noted throughout the skull.  She presents today for CT-guided bone marrow biopsy to rule out myeloma  Past Medical History:  Diagnosis Date  . Allergic rhinitis   . Anxiety   . Arthritis   . Asthma   . Cancer [facility]) 2006   breast cancer right  . CHF (congestive heart failure) ([place])   . COPD (chronic obstructive pulmonary disease) ([place])   . Degenerative disc disease, lumbar   . Diabetes mellitus without complication ([place])   . Dyspnea    walking distances  . Eczema   . GERD (gastroesophageal reflux disease)   . Glaucoma   . History of home oxygen therapy    uses 2 liters ay night and prn  . Hyperlipidemia   . Hypertension   . Low back pain   . Lupus    skin  . Neck pain   . Numbness and tingling   . Obesity   . Osteopenia   . Pulmonary embolism ([place]) 01/2012    CT showed multi small PE and coumadized   . Sleep apnea 2010   no cpap used  . Systemic lupus erythematosus ([place])     Past Surgical History:  Procedure Laterality Date  . ABDOMINAL HYSTERECTOMY  1976  . BACK SURGERY  2006   lower  . BREAST BIOPSY Right   . BREAST LUMPECTOMY Right   . COLONOSCOPY WITH PROPOFOL N/A 03/23/2015   Procedure: COLONOSCOPY WITH PROPOFOL;  Surgeon: Laurence Spates, MD;  Location: [facility] ENDOSCOPY;  Service: Endoscopy;  Laterality: N/A;  . Dobtamine myoview  05/02/2008   EF 67% ; LV norm  . DOPPLER ECHOCARDIOGRAPHY  01/25/2012   EF 55 TO 60%; LV norm.  . EXPLORATORY LAPAROTOMY    . EYE SURGERY Bilateral 2010   lens reaplcments for cataracts   . Lower Extrem. venous doppler  01/25/2012    neg.  Marland Kitchen MASTECTOMY      right side  . TOE SURGERY  1996   Bunion    Allergies: Penicillins; Fentanyl; Peach [prunus persica]; and Shellfish allergy  Medications: Prior to Admission medications   Medication Sig Start Date End Date Taking? Authorizing Provider  acetaminophen (TYLENOL) 650 MG CR tablet Take 1,300 mg by mouth every 8 (eight) hours as needed for pain.   Yes [provider]  albuterol (PROVENTIL HFA;VENTOLIN HFA) 108 (90 BASE) MCG/ACT inhaler Inhale 2 puffs into the lungs See admin instructions. Every 6 hours as needed for SOB but also takes 2 puffs BID scheduled.   Yes [provider]  albuterol (PROVENTIL) (2.5 MG/3ML) 0.083% nebulizer solution Take 2.5 mg by nebulization every 6 (six) hours as needed for wheezing or shortness of breath.    Yes [provider]  alendronate (FOSAMAX) 70 MG tablet Take 70 mg by mouth every Monday. Take with a full glass of water on an empty stomach.   Yes [provider]  allopurinol (ZYLOPRIM) 100 MG tablet Take 100 mg by mouth daily.   Yes [provider]  aspirin 325 MG tablet Take 325 mg by mouth daily.   Yes [provider]  atenolol (TENORMIN) 100 MG tablet Take 100  mg by mouth daily.    Yes [provider]  budesonide (PULMICORT) 0.5 MG/2ML nebulizer solution Take 0.5 mg by nebulization every 6 (six) hours as needed (For wheezing.).    Yes [provider]  calcium carbonate (OS-CAL) 1250 (500 Ca) MG chewable tablet Chew 1 tablet by mouth daily.   Yes [provider]  cetirizine (ZYRTEC) 10 MG tablet Take 10 mg by mouth daily.   Yes [provider]  cholecalciferol (VITAMIN D) 1000 UNITS tablet Take 1,000 Units by mouth daily.   Yes [provider]  citalopram (CELEXA) 10 MG tablet Take 10 mg by mouth daily.    Yes [provider]  clobetasol ointment (TEMOVATE) 5.57 % Apply 1 application topically 2 (two) times daily.    Yes [provider]    desonide (DESOWEN) 0.05 % cream Apply 1 application topically 2 (two) times daily.    Yes [provider]  dexlansoprazole (DEXILANT) 60 MG capsule Take 60 mg by mouth daily.    Yes [provider]  diclofenac sodium (VOLTAREN) 1 % GEL Apply 2 g topically daily. Reported on 09/15/2015   Yes [provider]  diltiazem (CARTIA XT) 300 MG 24 hr capsule Take 300 mg by mouth daily. Reported on 09/15/2015   Yes [provider]  furosemide (LASIX) 40 MG tablet Take 40 mg by mouth 2 (two) times daily.    Yes [provider]  gabapentin (NEURONTIN) 300 MG capsule Take 1 capsule (300 mg total) by mouth 2 (two) times daily. 12/08/14  Yes Marcial Pacas, MD  hydrALAZINE (APRESOLINE) 25 MG tablet Take 25 mg by mouth 3 (three) times daily.   Yes [provider]  hydroxychloroquine (PLAQUENIL) 200 MG tablet Take 200 mg by mouth daily.    Yes [provider]  insulin aspart (NOVOLOG) 100 UNIT/ML injection Inject 10 Units into the skin 3 (three) times daily before meals.    Yes [provider]  insulin glargine (LANTUS) 100 UNIT/ML injection Inject 40-50 Units into the skin 2 (two) times daily. She takes 50 units in the morning and 40 units at bedtime. 01/29/12  Yes Rai, Ripudeep K, MD  isosorbide mononitrate (IMDUR) 30 MG 24 hr tablet Take 30 mg by mouth every morning.   Yes [provider]  ketotifen (ALAWAY) 0.025 % ophthalmic solution Place 1 drop into both eyes daily.    Yes [provider]  lisinopril-hydrochlorothiazide (PRINZIDE,ZESTORETIC) 20-12.5 MG per tablet Take 1 tablet by mouth daily. 03/08/13  Yes [provider]  OXYGEN Inhale into the lungs. 2L/min - prn during day and every evening.   Yes [provider]  pravastatin (PRAVACHOL) 40 MG tablet Take 40 mg by mouth at bedtime.    Yes [provider]  pregabalin (LYRICA) 75 MG capsule Take 75 mg by mouth daily.   Yes [provider]   SYMBICORT 160-4.5 MCG/ACT inhaler Inhale 2 puffs into the lungs 2 (two) times daily. Reported on 09/15/2015 04/22/13  Yes [provider]  tizanidine (ZANAFLEX) 2 MG capsule Take 2 mg by mouth 2 (two) times daily.   Yes [provider]  traMADol (ULTRAM) 50 MG tablet Take 1 tablet (50 mg total) by mouth every 6 (six) hours as needed for severe pain. 08/24/16  Yes Jessy Oto, MD  vitamin B-12 (CYANOCOBALAMIN) 1000 MCG tablet Take 1,000 mcg by mouth daily. Reported on 09/15/2015   Yes [provider]  diphenhydrAMINE (BENADRYL) 25 MG tablet Take 25 mg by  mouth every 6 (six) hours as needed for itching.     [provider]     Family History  Problem Relation Age of Onset  . Heart failure Father   . Heart failure Mother   . Diabetes Brother   . Breast cancer Maternal Aunt   . Breast cancer Paternal Aunt     Social History   Socioeconomic History  . Marital status: Single    Spouse name: None  . Number of children: 5  . Years of education: 50  . Highest education level: None  Social Needs  . Financial resource strain: None  . Food insecurity - worry: None  . Food insecurity - inability: None  . Transportation needs - medical: None  . Transportation needs - non-medical: None  Occupational History  . Occupation: Disabled  Tobacco Use  . Smoking status: Former Research scientist (life sciences)  . Smokeless tobacco: Never Used  . Tobacco comment: Quit 1980  Substance and Sexual Activity  . Alcohol use: No    Alcohol/week: 0.0 oz  . Drug use: No  . Sexual activity: None  Other Topics Concern  . None  Social History Narrative   Lives at home alone.   Right-handed.   2-4 cups caffeine daily.     Review of Systems denies fever, chest pain, nausea, vomiting or bleeding.  She does have occasional headaches, some dyspnea with exertion, occasional cough, abdominal back/shoulder discomfort, weight loss and occasional night sweats.  Vital Signs: BP (!) 159/71   Pulse 91    Temp 99.2 F (37.3 C) (Oral)   Resp 16   Ht '5\' 4"'$  (1.626 m)   Wt 246 lb (111.6 kg)   SpO2 95%   BMI 42.23 kg/m   Physical Exam awake, alert.  Chest with distant breath sounds bilaterally.  Heart with regular rate and rhythm.  Abdomen obese, soft, positive bowel sounds, mild generalized tenderness to palpation; no significant lower extremity edema.  Imaging: Dg Bone Survey Met  Result Date: 03/23/2017 CLINICAL DATA:  Monoclonal gammopathy. EXAM: METASTATIC BONE SURVEY COMPARISON:  MRI 06/16/2015.  CT 02/04/2015. FINDINGS: Multiple very tiny lucencies are noted throughout the skull. Myeloma cannot be excluded. Severe diffuse cervicothoracic a and thoraco lumbar degenerative change with thoracic kyphoscoliosis. Prior lower lumbar spine fusion. Diffuse multi joint degenerative change. No focal abnormality noted of the appendicular skeleton. Peripheral vascular calcification. IMPRESSION: Multiple very tiny tiny lucencies noted throughout skull. Myeloma cannot be excluded. Electronically Signed   By: Marcello Moores  Register   On: 03/23/2017 13:18   Xr C-arm No Report  Result Date: 03/14/2017 Please see Notes or Procedures tab for imaging impression.   Labs:  CBC: Recent Labs    03/15/17 1202 04/04/17 0721  WBC 14.0* 8.7  HGB 10.9* 10.8*  HCT 33.1* 33.1*  PLT 316 316    COAGS: Recent Labs    04/04/17 0721  INR 0.99    BMP: Recent Labs    03/15/17 1202  NA 140  K 4.3  CO2 23  GLUCOSE 123  BUN 36.7*  CALCIUM 9.8  CREATININE 2.0*    LIVER FUNCTION TESTS: Recent Labs    03/15/17 1202  BILITOT 0.24  AST 22  ALT 18  ALKPHOS 95  PROT 7.8  7.3  ALBUMIN 3.6    TUMOR MARKERS: No results for input(s): AFPTM, CEA, CA199, CHROMGRNA in the last 8760 hours.  Assessment and Plan: 76 y.o. female with remote history of breast cancer and now with MGUS and recent bone survey revealing  multiple very tiny lucencies noted throughout the skull.  She presents today for CT-guided  bone marrow biopsy to rule out myeloma.Risks and benefits discussed with the patient including, but not limited to bleeding, infection, damage to adjacent structures or low yield requiring additional tests.All of the patient's questions were answered, patient is agreeable to proceed.Consent signed and in chart.     Thank you for this interesting consult.  I greatly enjoyed meeting [name] [name] and look forward to participating in their care.  A copy of this report was sent to the requesting provider on this date.  Electronically Signed: D. Rowe Robert, PA-C 04/04/2017, 8:30 AM   I spent a total of 20 minutes  in face to face in clinical consultation, greater than 50% of which was counseling/coordinating care for CT-guided bone marrow biopsy

## 2017-04-05 ENCOUNTER — Other Ambulatory Visit: Payer: Self-pay | Admitting: Nephrology

## 2017-04-05 DIAGNOSIS — N184 Chronic kidney disease, stage 4 (severe): Secondary | ICD-10-CM

## 2017-04-06 ENCOUNTER — Encounter (INDEPENDENT_AMBULATORY_CARE_PROVIDER_SITE_OTHER): Payer: Self-pay | Admitting: Specialist

## 2017-04-06 ENCOUNTER — Ambulatory Visit (INDEPENDENT_AMBULATORY_CARE_PROVIDER_SITE_OTHER): Payer: Medicare HMO | Admitting: Specialist

## 2017-04-06 VITALS — BP 126/70 | HR 85 | Ht 64.0 in | Wt 236.0 lb

## 2017-04-06 DIAGNOSIS — M5136 Other intervertebral disc degeneration, lumbar region: Secondary | ICD-10-CM

## 2017-04-06 DIAGNOSIS — I5032 Chronic diastolic (congestive) heart failure: Secondary | ICD-10-CM

## 2017-04-06 DIAGNOSIS — M3212 Pericarditis in systemic lupus erythematosus: Secondary | ICD-10-CM

## 2017-04-06 DIAGNOSIS — M48062 Spinal stenosis, lumbar region with neurogenic claudication: Secondary | ICD-10-CM

## 2017-04-06 NOTE — Progress Notes (Signed)
Office Visit Note   Patient: [name] [name]           Date of Birth: 1941-03-14           MRN: [id number] Visit Date: 04/06/2017              Requested by: Nolene Ebbs, MD [address] [place], [place] [address] PCP: Nolene Ebbs, MD   Assessment & Plan: Visit Diagnoses:  1. Spinal stenosis of lumbar region with neurogenic claudication   2. Degenerative disc disease, lumbar   3. Systemic lupus erythematosus (SLE) with pericarditis, unspecified SLE type ([place])   4. Chronic diastolic heart failure (HCC)     Plan: Avoid bending, stooping and avoid lifting weights greater than 10 lbs. Avoid prolong standing and walking. Avoid frequent bending and stooping  No lifting greater than 10 lbs. May use ice or moist heat for pain. Weight loss is of benefit. Handicap license is approved. Use of an exercise bicycle or pool walking is better tolerated than prolong standing or prolong walking.   Follow-Up Instructions: Return in about 4 weeks (around 05/04/2017).   Orders:  No orders of the defined types were placed in this encounter.  No orders of the defined types were placed in this encounter.     Procedures: No procedures performed   Clinical Data: No additional findings.   Subjective: Chief Complaint  Patient presents with  . Lower Back - Follow-up    Patient had Bilat L2-3 Transforminal injection with Dr. Ernestina Patches on 03/14/2017    76 year old female with history of neurogenic claudication, previous L4-5 decompression and fusion in [place] greater than 10 years ago 2016. Also history of breast cancer treated with Breast biopsy and lymph node negative. Treated with chemotherapy and has done well, most recent mammogram is negative 07/2016. No bowel or bladder difficulties. Legs both sides with numbness and tingling. Leg swelling right greater left and she has a history of renal insufficiency. She has been found to have lucencies in the skull that are worrisome for possible  multiple myeloma and underwent recent 12/18 biopsy on the right posterior iliac crest. Most recent ESIs by Dr. Ernestina Patches at the L2-3 transforamen.     Review of Systems  Constitutional: Negative.   HENT: Positive for postnasal drip, rhinorrhea, sinus pressure, sinus pain and sore throat. Negative for congestion.   Eyes: Negative.  Negative for visual disturbance.  Respiratory: Positive for shortness of breath and wheezing. Negative for apnea and chest tightness.   Cardiovascular: Negative.  Negative for chest pain, palpitations and leg swelling.  Gastrointestinal: Negative.  Negative for abdominal distention, abdominal pain, anal bleeding and constipation.  Endocrine: Negative.  Negative for cold intolerance and heat intolerance.  Genitourinary: Negative.  Negative for difficulty urinating, dyspareunia, dysuria, flank pain and hematuria.  Musculoskeletal: Negative.  Negative for back pain and gait problem.  Skin: Negative.  Negative for color change, pallor, rash and wound.  Allergic/Immunologic: Negative.  Negative for environmental allergies, food allergies and immunocompromised state.  Neurological: Negative.  Negative for dizziness, seizures, syncope, facial asymmetry, speech difficulty, light-headedness, numbness and headaches.  Hematological: Negative.  Negative for adenopathy. Does not bruise/bleed easily.  Psychiatric/Behavioral: Negative.  Negative for self-injury, sleep disturbance and suicidal ideas. The patient is not nervous/anxious and is not hyperactive.      Objective: Vital Signs: BP 126/70 (BP Location: Left Arm, Patient Position: Sitting)   Pulse 85   Ht '5\' 4"'$  (1.626 m)   Wt 236 lb (107 kg)  BMI 40.51 kg/m   Physical Exam  Constitutional: She is oriented to person, place, and time. She appears well-developed and well-nourished.  HENT:  Head: Normocephalic and atraumatic.  Eyes: EOM are normal. Pupils are equal, round, and reactive to light.  Neck: Normal range of  motion. Neck supple.  Pulmonary/Chest: Effort normal and breath sounds normal.  Abdominal: Soft. Bowel sounds are normal.  Neurological: She is alert and oriented to person, place, and time.  Skin: Skin is warm and dry.  Psychiatric: She has a normal mood and affect. Her behavior is normal. Judgment and thought content normal.    Back Exam   Tenderness  The patient is experiencing tenderness in the lumbar.  Range of Motion  Extension: abnormal  Flexion: normal  Lateral bend right: abnormal  Lateral bend left: abnormal  Rotation right: abnormal  Rotation left: abnormal   Muscle Strength  The patient has normal back strength. Right Quadriceps:  5/5  Left Quadriceps:  5/5  Right Hamstrings:  5/5  Left Hamstrings:  5/5   Tests  Straight leg raise right: negative Straight leg raise left: negative  Reflexes  Patellar: normal Achilles: normal Babinski's sign: normal   Other  Toe walk: normal Heel walk: normal Sensation: normal Gait: antalgic  Erythema: no back redness Scars: present      Specialty Comments:  No specialty comments available.  Imaging: No results found.   PMFS History: Patient Active Problem List   Diagnosis Date Noted  . Abnormality of gait 08/05/2015  . Spinal stenosis of lumbar region 08/05/2015  . Peripheral neuropathy 08/05/2015  . Chest pain 12/28/2013  . Hyperlipidemia 04/05/2013  . Systemic lupus erythematosus ([place]) 04/05/2013  . Pulmonary embolism ([place]) 01/24/2012  . Chronic diastolic heart failure ([place]) 01/24/2012  . COPD (chronic obstructive pulmonary disease) ([place]) 01/24/2012  . Diabetes mellitus ([place]) 01/24/2012  . Hypertension 01/24/2012  . Leukocytosis 01/24/2012  . Bronchitis 01/24/2012  . Anemia 01/24/2012   Past Medical History:  Diagnosis Date  . Allergic rhinitis   . Anxiety   . Arthritis   . Asthma   . Cancer [facility]) 2006   breast cancer right  . CHF (congestive heart failure) ([place])   . COPD (chronic  obstructive pulmonary disease) ([place])   . Degenerative disc disease, lumbar   . Diabetes mellitus without complication ([place])   . Dyspnea    walking distances  . Eczema   . GERD (gastroesophageal reflux disease)   . Glaucoma   . History of home oxygen therapy    uses 2 liters ay night and prn  . Hyperlipidemia   . Hypertension   . Low back pain   . Lupus    skin  . Neck pain   . Numbness and tingling   . Obesity   . Osteopenia   . Pulmonary embolism ([place]) 01/2012    CT showed multi small PE and coumadized   . Sleep apnea 2010   no cpap used  . Systemic lupus erythematosus (HCC)     Family History  Problem Relation Age of Onset  . Heart failure Father   . Heart failure Mother   . Diabetes Brother   . Breast cancer Maternal Aunt   . Breast cancer Paternal Aunt     Past Surgical History:  Procedure Laterality Date  . ABDOMINAL HYSTERECTOMY  1976  . BACK SURGERY  2006   lower  . BREAST BIOPSY Right   . BREAST LUMPECTOMY Right   . COLONOSCOPY WITH PROPOFOL N/A  03/23/2015   Procedure: COLONOSCOPY WITH PROPOFOL;  Surgeon: Laurence Spates, MD;  Location: [facility];  Service: Endoscopy;  Laterality: N/A;  . Dobtamine myoview  05/02/2008   EF 67% ; LV norm  . DOPPLER ECHOCARDIOGRAPHY  01/25/2012   EF 55 TO 60%; LV norm.  . EXPLORATORY LAPAROTOMY    . EYE SURGERY Bilateral 2010   lens reaplcments for cataracts   . Lower Extrem. venous doppler  01/25/2012    neg.  Marland Kitchen MASTECTOMY     right side  . TOE SURGERY  1996   Bunion   Social History   Occupational History  . Occupation: Disabled  Tobacco Use  . Smoking status: Former Research scientist (life sciences)  . Smokeless tobacco: Never Used  . Tobacco comment: Quit 1980  Substance and Sexual Activity  . Alcohol use: No    Alcohol/week: 0.0 oz  . Drug use: No  . Sexual activity: Not on file

## 2017-04-06 NOTE — Patient Instructions (Signed)
Avoid bending, stooping and avoid lifting weights greater than 10 lbs. Avoid prolong standing and walking. Avoid frequent bending and stooping  No lifting greater than 10 lbs. May use ice or moist heat for pain. Weight loss is of benefit. Handicap license is approved. Use of an exercise bicycle or pool walking is better tolerated than prolong standing or prolong walking.

## 2017-04-17 DIAGNOSIS — D472 Monoclonal gammopathy: Secondary | ICD-10-CM | POA: Insufficient documentation

## 2017-04-17 DIAGNOSIS — C9000 Multiple myeloma not having achieved remission: Secondary | ICD-10-CM | POA: Insufficient documentation

## 2017-04-17 NOTE — Assessment & Plan Note (Signed)
76 y.o. female referred for evaluation of possible medical gammopathy of unknown significance. The reference serum personal electrophoresis was obtained in October for 2017. With no repeat lab work since. This may represent true monoclonal gammopathy such as present in the amyloidosis or multiple myeloma or may be a component of the patient's systemic inflammatory disorder such as lupus. Patient does have symptoms that may be consistent with multiple myeloma, she does have chronic kidney disease that has progressed over the past year. She does have multiple other reasons for both her progressive creatinine and peripheral neuropathy such as from diabetes.  Plan: -- skeletal survey -- repeat full lab work as outlined below -- return to clinic in one week to review the findings.

## 2017-04-17 NOTE — Progress Notes (Signed)
[facility] New Visit:  Assessment: MGUS (monoclonal gammopathy of unknown significance) 76 y.o. female referred for evaluation of possible medical gammopathy of unknown significance. The reference serum personal electrophoresis was obtained in October for 2017. With no repeat lab work since. This may represent true monoclonal gammopathy such as present in the amyloidosis or multiple myeloma or may be a component of the patient's systemic inflammatory disorder such as lupus. Patient does have symptoms that may be consistent with multiple myeloma, she does have chronic kidney disease that has progressed over the past year. She does have multiple other reasons for both her progressive creatinine and peripheral neuropathy such as from diabetes.  Plan: -- skeletal survey -- repeat full lab work as outlined below -- return to clinic in one week to review the findings.  Voice recognition software was used and creation of this note. Despite my best effort at editing the text, some misspelling/errors may have occurred.   Orders Placed This Encounter  Procedures  . DG Bone Survey Met    Standing Status:   Future    Number of Occurrences:   1    Standing Expiration Date:   05/15/2018    Order Specific Question:   Reason for Exam (SYMPTOM  OR DIAGNOSIS REQUIRED)    Answer:   MGUS, please eval forpossible lytic lesions    Order Specific Question:   Preferred imaging location?    Answer:   [facility]    Order Specific Question:   Radiology Contrast Protocol - do NOT remove file path    Answer:   [email or web address]  . CBC with Differential    Standing Status:   Future    Number of Occurrences:   1    Standing Expiration Date:   03/15/2018  . Comprehensive metabolic panel    Standing Status:   Future    Number of Occurrences:   1    Standing Expiration Date:   03/15/2018  . Uric acid    Standing Status:   Future    Number  of Occurrences:   1    Standing Expiration Date:   03/15/2018  . Lactate dehydrogenase (LDH)    Standing Status:   Future    Number of Occurrences:   1    Standing Expiration Date:   03/15/2018  . Beta 2 microglobulin    Standing Status:   Future    Number of Occurrences:   1    Standing Expiration Date:   03/15/2018  . Multiple Myeloma Panel (SPEP&IFE w/QIG)    Standing Status:   Future    Number of Occurrences:   1    Standing Expiration Date:   03/15/2018  . Kappa/lambda light chains    Standing Status:   Future    Number of Occurrences:   1    Standing Expiration Date:   03/15/2018  . 24-Hr Ur UPEP/UIFE/Light Chains/TP    Standing Status:   Future    Number of Occurrences:   1    Standing Expiration Date:   03/15/2018    All questions were answered.  . The patient knows to call the clinic with any problems, questions or concerns.  This note was electronically signed.    History of Presenting Illness [name] [name] 76 y.o. presenting to the [facility] for possible monoclonal gammopathy of unknown significance, referred by Dr Philis Fendt. Patient has extensive best medical history including systemic lupus with Sjogren's syndrome, cutaneous manifestations, and  positive antibodies, diabetes mellitus, hypertension, chronic kidney disease stage III, aerobatic neuropathy, history of pulmonary embolism, congestive heart failure, as well as a right-sided breast cancer treated with lumpectomy and 2006.   It appears that patient was discovered to have a monoclonal gammopathy sometime early in 2018 or may be filled thousand 17 with recommendation by the rheumatology to be referred for hematology assessment.Please see the summary below for outline or available lab work. At the present time, patient complains mainly of pain in the years bilaterally especially on the left side with intermittent bleeding and possible purulent drainage. Additionally, patient complains of gout episodes and  peripheral neuropathy with tingling and burning in the extremities especially in her feet bilaterally. She denies unexpected weight loss. Denies any new pain in the bone structures, no new neurological, gastrointestinal, or genitourinary complaints.  Oncological/hematological History: --Labs, 02/15/16: SPEP -- M-Spike 0.6g/dL --Feb 2018: tProt 7.3, Alb 3.6, Ca 10.3, Cr 1.6, AP 112; WBC 8.0, Hgb 11.2, Plt 284 --May 2018: tProt 7.8, Alb 3.7, Ca   9.7, Cr 1.9, AP 128; WBC 7.5, Hgb 10.3, Plt 369;  Medical History: Past Medical History:  Diagnosis Date  . Allergic rhinitis   . Anxiety   . Arthritis   . Asthma   . Cancer [facility] 2006   breast cancer right  . CHF (congestive heart failure) ([place])   . COPD (chronic obstructive pulmonary disease) ([place])   . Degenerative disc disease, lumbar   . Diabetes mellitus without complication ([place])   . Dyspnea    walking distances  . Eczema   . GERD (gastroesophageal reflux disease)   . Glaucoma   . History of home oxygen therapy    uses 2 liters ay night and prn  . Hyperlipidemia   . Hypertension   . Low back pain   . Lupus    skin  . Neck pain   . Numbness and tingling   . Obesity   . Osteopenia   . Pulmonary embolism ([place]) 01/2012    CT showed multi small PE and coumadized   . Sleep apnea 2010   no cpap used  . Systemic lupus erythematosus ([place])     Surgical History: Past Surgical History:  Procedure Laterality Date  . ABDOMINAL HYSTERECTOMY  1976  . BACK SURGERY  2006   lower  . BREAST BIOPSY Right   . BREAST LUMPECTOMY Right   . COLONOSCOPY WITH PROPOFOL N/A 03/23/2015   Procedure: COLONOSCOPY WITH PROPOFOL;  Surgeon: Laurence Spates, MD;  Location: [facility] ENDOSCOPY;  Service: Endoscopy;  Laterality: N/A;  . Dobtamine myoview  05/02/2008   EF 67% ; LV norm  . DOPPLER ECHOCARDIOGRAPHY  01/25/2012   EF 55 TO 60%; LV norm.  . EXPLORATORY LAPAROTOMY    . EYE SURGERY Bilateral 2010   lens reaplcments for cataracts   . Lower Extrem.  venous doppler  01/25/2012    neg.  Marland Kitchen MASTECTOMY     right side  . TOE SURGERY  1996   Bunion    Family History: Family History  Problem Relation Age of Onset  . Heart failure Father   . Heart failure Mother   . Diabetes Brother   . Breast cancer Maternal Aunt   . Breast cancer Paternal Aunt     Social History: Social History   Socioeconomic History  . Marital status: Single    Spouse name: Not on file  . Number of children: 5  . Years of education: 65  . Highest education level:  Not on file  Social Needs  . Financial resource strain: Not on file  . Food insecurity - worry: Not on file  . Food insecurity - inability: Not on file  . Transportation needs - medical: Not on file  . Transportation needs - non-medical: Not on file  Occupational History  . Occupation: Disabled  Tobacco Use  . Smoking status: Former Research scientist (life sciences)  . Smokeless tobacco: Never Used  . Tobacco comment: Quit 1980  Substance and Sexual Activity  . Alcohol use: No    Alcohol/week: 0.0 oz  . Drug use: No  . Sexual activity: Not on file  Other Topics Concern  . Not on file  Social History Narrative   Lives at home alone.   Right-handed.   2-4 cups caffeine daily.    Allergies: Allergies  Allergen Reactions  . Penicillins Swelling    Has patient had a PCN reaction causing immediate rash, facial/tongue/throat swelling, SOB or lightheadedness with hypotension: Yes Has patient had a PCN reaction causing severe rash involving mucus membranes or skin necrosis: Yes Has patient had a PCN reaction that required hospitalization Yes Has patient had a PCN reaction occurring within the last 10 years: No, more than 10 yrs ago If all of the above answers are "NO", then may proceed with Cephalosporin use.   . Fentanyl Itching  . Peach [Prunus Persica] Hives  . Shellfish Allergy Hives    Medications:  Current Outpatient Medications  Medication Sig Dispense Refill  . acetaminophen (TYLENOL) 650 MG CR  tablet Take 1,300 mg by mouth every 8 (eight) hours as needed for pain.    Marland Kitchen albuterol (PROVENTIL HFA;VENTOLIN HFA) 108 (90 BASE) MCG/ACT inhaler Inhale 2 puffs into the lungs See admin instructions. Every 6 hours as needed for SOB but also takes 2 puffs BID scheduled.    Marland Kitchen albuterol (PROVENTIL) (2.5 MG/3ML) 0.083% nebulizer solution Take 2.5 mg by nebulization every 6 (six) hours as needed for wheezing or shortness of breath.     Marland Kitchen alendronate (FOSAMAX) 70 MG tablet Take 70 mg by mouth every Monday. Take with a full glass of water on an empty stomach.    Marland Kitchen allopurinol (ZYLOPRIM) 100 MG tablet Take 100 mg by mouth daily.    Marland Kitchen aspirin 325 MG tablet Take 325 mg by mouth daily.    Marland Kitchen atenolol (TENORMIN) 100 MG tablet Take 100 mg by mouth daily.     . budesonide (PULMICORT) 0.5 MG/2ML nebulizer solution Take 0.5 mg by nebulization every 6 (six) hours as needed (For wheezing.).     Marland Kitchen calcium carbonate (OS-CAL) 1250 (500 Ca) MG chewable tablet Chew 1 tablet by mouth daily.    . cetirizine (ZYRTEC) 10 MG tablet Take 10 mg by mouth daily.    . cholecalciferol (VITAMIN D) 1000 UNITS tablet Take 1,000 Units by mouth daily.    . citalopram (CELEXA) 10 MG tablet Take 10 mg by mouth daily.     . clobetasol ointment (TEMOVATE) 1.61 % Apply 1 application topically 2 (two) times daily.     Marland Kitchen desonide (DESOWEN) 0.05 % cream Apply 1 application topically 2 (two) times daily.     Marland Kitchen dexlansoprazole (DEXILANT) 60 MG capsule Take 60 mg by mouth daily.     . diclofenac sodium (VOLTAREN) 1 % GEL Apply 2 g topically daily. Reported on 09/15/2015    . diltiazem (CARTIA XT) 300 MG 24 hr capsule Take 300 mg by mouth daily. Reported on 09/15/2015    . diphenhydrAMINE (BENADRYL) 25  MG tablet Take 25 mg by mouth every 6 (six) hours as needed for itching.     . furosemide (LASIX) 40 MG tablet Take 40 mg by mouth 2 (two) times daily.     Marland Kitchen gabapentin (NEURONTIN) 300 MG capsule Take 1 capsule (300 mg total) by mouth 2 (two) times  daily. 60 capsule 6  . hydrALAZINE (APRESOLINE) 25 MG tablet Take 25 mg by mouth 3 (three) times daily.    . hydroxychloroquine (PLAQUENIL) 200 MG tablet Take 200 mg by mouth daily.     . insulin aspart (NOVOLOG) 100 UNIT/ML injection Inject 10 Units into the skin 3 (three) times daily before meals.     . insulin glargine (LANTUS) 100 UNIT/ML injection Inject 40-50 Units into the skin 2 (two) times daily. She takes 50 units in the morning and 40 units at bedtime.    . isosorbide mononitrate (IMDUR) 30 MG 24 hr tablet Take 30 mg by mouth every morning.    Marland Kitchen ketotifen (ALAWAY) 0.025 % ophthalmic solution Place 1 drop into both eyes daily.     Marland Kitchen lisinopril-hydrochlorothiazide (PRINZIDE,ZESTORETIC) 20-12.5 MG per tablet Take 1 tablet by mouth daily.    . OXYGEN Inhale into the lungs. 2L/min - prn during day and every evening.    . pravastatin (PRAVACHOL) 40 MG tablet Take 40 mg by mouth at bedtime.     . SYMBICORT 160-4.5 MCG/ACT inhaler Inhale 2 puffs into the lungs 2 (two) times daily. Reported on 09/15/2015    . tizanidine (ZANAFLEX) 2 MG capsule Take 2 mg by mouth 2 (two) times daily.    . traMADol (ULTRAM) 50 MG tablet Take 1 tablet (50 mg total) by mouth every 6 (six) hours as needed for severe pain. 60 tablet 0  . vitamin B-12 (CYANOCOBALAMIN) 1000 MCG tablet Take 1,000 mcg by mouth daily. Reported on 09/15/2015     No current facility-administered medications for this visit.     Review of Systems: Review of Systems  HENT:   Negative for hearing loss, mouth sores and tinnitus.   Eyes: Negative.   Musculoskeletal: Positive for gait problem.  Neurological: Positive for dizziness, gait problem and numbness. Negative for extremity weakness, headaches and seizures.  All other systems reviewed and are negative.    PHYSICAL EXAMINATION Blood pressure 134/63, pulse 84, temperature 99.4 F (37.4 C), temperature source Oral, resp. rate (!) 21, height '5\' 4"'$  (1.626 m), weight 245 lb 11.2 oz (111.4  kg), SpO2 99 %.  ECOG PERFORMANCE STATUS: 2 - Symptomatic, <50% confined to bed  Physical Exam  Constitutional: She is oriented to person, place, and time and well-developed, well-nourished, and in no distress. No distress.  HENT:  Head: Normocephalic and atraumatic.  Right Ear: No drainage. No mastoid tenderness. Tympanic membrane is not perforated. No middle ear effusion.  Left Ear: No drainage. No mastoid tenderness. Tympanic membrane is perforated.  No middle ear effusion.  Mouth/Throat: Oropharynx is clear and moist. No oropharyngeal exudate.  Eyes: Conjunctivae and EOM are normal. Pupils are equal, round, and reactive to light. No scleral icterus.  Neck: No thyromegaly present.  Cardiovascular: Normal rate, regular rhythm and normal heart sounds.  No murmur heard. Pulmonary/Chest: Effort normal and breath sounds normal. No respiratory distress. She has no wheezes. She has no rales.  Abdominal: Soft. Bowel sounds are normal. She exhibits no distension. There is no tenderness. There is no rebound and no guarding.  Musculoskeletal: She exhibits no edema.  Lymphadenopathy:    She has no  cervical adenopathy.  Neurological: She is alert and oriented to person, place, and time. She has normal reflexes. No cranial nerve deficit.  Skin: Skin is warm and dry. No rash noted. She is not diaphoretic. No erythema.     LABORATORY DATA: I have personally reviewed the data as listed: Appointment on 03/15/2017  Component Date Value Ref Range Status  . WBC 03/15/2017 14.0* 3.9 - 10.3 10e3/uL Final  . NEUT# 03/15/2017 11.9* 1.5 - 6.5 10e3/uL Final  . HGB 03/15/2017 10.9* 11.6 - 15.9 g/dL Final  . HCT 03/15/2017 33.1* 34.8 - 46.6 % Final  . Platelets 03/15/2017 316  145 - 400 10e3/uL Final  . MCV 03/15/2017 86.4  79.5 - 101.0 fL Final  . MCH 03/15/2017 28.4  25.1 - 34.0 pg Final  . MCHC 03/15/2017 32.8  31.5 - 36.0 g/dL Final  . RBC 03/15/2017 3.83  3.70 - 5.45 10e6/uL Final  . RDW 03/15/2017  15.0* 11.2 - 14.5 % Final  . lymph# 03/15/2017 1.4  0.9 - 3.3 10e3/uL Final  . MONO# 03/15/2017 0.5  0.1 - 0.9 10e3/uL Final  . Eosinophils Absolute 03/15/2017 0.0  0.0 - 0.5 10e3/uL Final  . Basophils Absolute 03/15/2017 0.1  0.0 - 0.1 10e3/uL Final  . NEUT% 03/15/2017 84.7* 38.4 - 76.8 % Final  . LYMPH% 03/15/2017 10.3* 14.0 - 49.7 % Final  . MONO% 03/15/2017 3.9  0.0 - 14.0 % Final  . EOS% 03/15/2017 0.1  0.0 - 7.0 % Final  . BASO% 03/15/2017 1.0  0.0 - 2.0 % Final  . Sodium 03/15/2017 140  136 - 145 mEq/L Final  . Potassium 03/15/2017 4.3  3.5 - 5.1 mEq/L Final  . Chloride 03/15/2017 106  98 - 109 mEq/L Final  . CO2 03/15/2017 23  22 - 29 mEq/L Final  . Glucose 03/15/2017 123  70 - 140 mg/dl Final   Glucose reference range is for nonfasting patients. Fasting glucose reference range is 70- 100.  Marland Kitchen BUN 03/15/2017 36.7* 7.0 - 26.0 mg/dL Final  . Creatinine 03/15/2017 2.0* 0.6 - 1.1 mg/dL Final  . Total Bilirubin 03/15/2017 0.24  0.20 - 1.20 mg/dL Final  . Alkaline Phosphatase 03/15/2017 95  40 - 150 U/L Final  . AST 03/15/2017 22  5 - 34 U/L Final  . ALT 03/15/2017 18  0 - 55 U/L Final  . Total Protein 03/15/2017 7.8  6.4 - 8.3 g/dL Final  . Albumin 03/15/2017 3.6  3.5 - 5.0 g/dL Final  . Calcium 03/15/2017 9.8  8.4 - 10.4 mg/dL Final  . Anion Gap 03/15/2017 11  3 - 11 mEq/L Final  . EGFR 03/15/2017 28* >60 ml/min/1.73 m2 Final   eGFR is calculated using the CKD-EPI Creatinine Equation (2009)  . Uric Acid, Serum 03/15/2017 10.3* 2.6 - 7.4 mg/dl Final  . LDH 03/15/2017 371* 125 - 245 U/L Final  . Beta-2 03/15/2017 4.5* 0.6 - 2.4 mg/L Final   Siemens Immulite 2000 Immunochemiluminometric assay (ICMA)  . IgG, Qn, Serum 03/15/2017 1,520  700 - 1600 mg/dL Final  . IgA, Qn, Serum 03/15/2017 171  64 - 422 mg/dL Final  . IgM, Qn, Serum 03/15/2017 73  26 - 217 mg/dL Final  . Total Protein 03/15/2017 7.3  6.0 - 8.5 g/dL Final  . Albumin SerPl Elph-Mcnc 03/15/2017 3.3  2.9 - 4.4 g/dL Final   . Alpha 1 03/15/2017 0.2  0.0 - 0.4 g/dL Final  . Alpha2 Glob SerPl Elph-Mcnc 03/15/2017 1.1* 0.4 - 1.0 g/dL Final  .  B-Globulin SerPl Elph-Mcnc 03/15/2017 1.3  0.7 - 1.3 g/dL Final  . Gamma Glob SerPl Elph-Mcnc 03/15/2017 1.4  0.4 - 1.8 g/dL Final  . M Protein SerPl Elph-Mcnc 03/15/2017 0.7* Not Observed g/dL Final  . Globulin, Total 03/15/2017 4.0* 2.2 - 3.9 g/dL Final  . Albumin/Glob SerPl 03/15/2017 0.9  0.7 - 1.7 Final  . IFE 1 03/15/2017 Comment   Final   Comment: Immunofixation shows IgG monoclonal protein with lambda light chain specificity.   . Please Note 03/15/2017 Comment   Final   Comment: Protein electrophoresis scan will follow via computer, mail, or courier delivery.   Loletha Grayer Kappa Free Light Chain 03/15/2017 27.2* 3.3 - 19.4 mg/L Final  . Ig Lambda Free Light Chain 03/15/2017 17.0  5.7 - 26.3 mg/L Final  . Kappa/Lambda FluidC Ratio 03/15/2017 1.60  0.26 - 1.65 Final         Ardath Sax, MD

## 2017-04-21 ENCOUNTER — Ambulatory Visit
Admission: RE | Admit: 2017-04-21 | Discharge: 2017-04-21 | Disposition: A | Discharge status: Home / Self Care | Payer: Medicare Other | Source: Ambulatory Visit | Attending: Nephrology | Admitting: Nephrology

## 2017-04-21 ENCOUNTER — Encounter (HOSPITAL_COMMUNITY): Payer: Self-pay

## 2017-04-21 DIAGNOSIS — N184 Chronic kidney disease, stage 4 (severe): Secondary | ICD-10-CM

## 2017-04-21 LAB — TISSUE HYBRIDIZATION (BONE MARROW)-NCBH

## 2017-04-21 LAB — CHROMOSOME ANALYSIS, BONE MARROW

## 2017-04-22 NOTE — Assessment & Plan Note (Signed)
77 y.o. female referred for evaluation of possible medical gammopathy of unknown significance. The reference serum personal electrophoresis was obtained in October for 2017. With no repeat lab work since. This may represent true monoclonal gammopathy such as present in the amyloidosis or multiple myeloma or may be a component of the patient's systemic inflammatory disorder such as lupus. Patient does have symptoms that may be consistent with multiple myeloma, she does have chronic kidney disease that has progressed over the past year. She does have multiple other reasons for both her progressive creatinine and peripheral neuropathy such as from diabetes.  Plan: -- skeletal survey -- Consult IR for BM Bx -- return to clinic one week later to review the findings

## 2017-04-22 NOTE — Progress Notes (Signed)
[name] [name] Follow-up Visit:  Assessment: MGUS (monoclonal gammopathy of unknown significance) 77 y.o. female referred for evaluation of possible medical gammopathy of unknown significance. The reference serum personal electrophoresis was obtained in October for 2017. With no repeat lab work since. This may represent true monoclonal gammopathy such as present in the amyloidosis or multiple myeloma or may be a component of the patient's systemic inflammatory disorder such as lupus. Patient does have symptoms that may be consistent with multiple myeloma, she does have chronic kidney disease that has progressed over the past year. She does have multiple other reasons for both her progressive creatinine and peripheral neuropathy such as from diabetes.  Plan: -- skeletal survey -- Consult IR for BM Bx -- return to clinic one week later to review the findings  Voice recognition software was used and creation of this note. Despite my best effort at editing the text, some misspelling/errors may have occurred.  Orders Placed This Encounter  Procedures  . CT Biopsy    Standing Status:   Future    Number of Occurrences:   1    Standing Expiration Date:   03/21/2018    Order Specific Question:   Lab orders requested (DO NOT place separate lab orders, these will be automatically ordered during procedure specimen collection):    Answer:   Cytology - Non Pap    Comments:   Cytogenetics, MM FISH    Order Specific Question:   Lab orders requested (DO NOT place separate lab orders, these will be automatically ordered during procedure specimen collection):    Answer:   Surgical Pathology    Order Specific Question:   Lab orders requested (DO NOT place separate lab orders, these will be automatically ordered during procedure specimen collection):    Answer:   Other    Order Specific Question:   Reason for Exam (SYMPTOM  OR DIAGNOSIS REQUIRED)    Answer:   MGUS, evaluating for possible  multiple myeloma    Order Specific Question:   Preferred imaging location?    Answer:   [facility]    Order Specific Question:   Radiology Contrast Protocol - do NOT remove file path    Answer:   [email or web address]  . CT BONE MARROW BIOPSY & ASPIRATION    Standing Status:   Future    Number of Occurrences:   1    Standing Expiration Date:   06/20/2018    Order Specific Question:   Reason for Exam (SYMPTOM  OR DIAGNOSIS REQUIRED)    Answer:   MGUS, please eval for possible multiple myeloma    Order Specific Question:   Preferred imaging location?    Answer:   [facility]    Order Specific Question:   Radiology Contrast Protocol - do NOT remove file path    Answer:   [email or web address]    [name] Staging No matching staging information was found for the patient.  All questions were answered. . The patient knows to call the clinic with any problems, questions or concerns.  This note was electronically signed.    History of Presenting Illness [name] [name] is a 77 y.o. female followed in the [facility] for monoclonal gammopathy of unknown significance (MGUS), referred by Dr Philis Fendt. Patient has extensive best medical history including systemic lupus with Sjogren's syndrome, cutaneous manifestations, and positive antibodies, diabetes mellitus, hypertension, chronic kidney disease stage III, aerobatic neuropathy, history of pulmonary embolism, congestive heart failure, as well as  a right-sided breast [name] treated with lumpectomy and 2006.   It appears that patient was discovered to have a monoclonal gammopathy sometime early in 2018 or may be filled thousand 17 with recommendation by the rheumatology to be referred for hematology assessment.Please see the summary below for outline or available lab work. At the present time, patient complains mainly of pain in the years bilaterally especially on the left  side with intermittent bleeding and possible purulent drainage. Additionally, patient complains of gout episodes and peripheral neuropathy with tingling and burning in the extremities especially in her feet bilaterally. She denies unexpected weight loss. Denies any new pain in the bone structures, no new neurological, gastrointestinal, or genitourinary complaints.  Oncological/hematological History: **MGUS: --Labs, 02/15/16: SPEP -- M-Spike 0.6g/dL --Feb 2018: tProt 7.3, Alb 3.6, Ca 10.3, Cr 1.6, AP 112; WBC 8.0, Hgb 11.2, Plt 284 --May 2018: tProt 7.8, Alb 3.7, Ca   9.7, Cr 1.9, AP 128; WBC 7.5, Hgb 10.3, Plt 369; --Labs, 03/15/17: tProt 7.8, Alb 3.6, Ca 9.8, Cr 2.0, AP 95, LDH 371; SPEP -- M-spike 0.7g/dL, SIFE -- positive for monoclonal IgG lambda; IgG 1520, IgA 171, IgM 73; kappa 27.2, lambda 17.0, KLR 1.6; WBC 14.0, Hgb 10.9, Plt 316;    Medical History: Past Medical History:  Diagnosis Date  . Allergic rhinitis   . Anxiety   . Arthritis   . Asthma   . [name] [facility]) 2006   breast [name] right  . CHF (congestive heart failure) ([place])   . COPD (chronic obstructive pulmonary disease) ([place])   . Degenerative disc disease, lumbar   . Diabetes mellitus without complication ([place])   . Dyspnea    walking distances  . Eczema   . GERD (gastroesophageal reflux disease)   . Glaucoma   . History of home oxygen therapy    uses 2 liters ay night and prn  . Hyperlipidemia   . Hypertension   . Low back pain   . Lupus    skin  . Neck pain   . Numbness and tingling   . Obesity   . Osteopenia   . Pulmonary embolism ([place]) 01/2012    CT showed multi small PE and coumadized   . Sleep apnea 2010   no cpap used  . Systemic lupus erythematosus ([place])     Surgical History: Past Surgical History:  Procedure Laterality Date  . ABDOMINAL HYSTERECTOMY  1976  . BACK SURGERY  2006   lower  . BREAST BIOPSY Right   . BREAST LUMPECTOMY Right   . COLONOSCOPY WITH PROPOFOL N/A 03/23/2015    Procedure: COLONOSCOPY WITH PROPOFOL;  Surgeon: Laurence Spates, MD;  Location: [facility] ENDOSCOPY;  Service: Endoscopy;  Laterality: N/A;  . Dobtamine myoview  05/02/2008   EF 67% ; LV norm  . DOPPLER ECHOCARDIOGRAPHY  01/25/2012   EF 55 TO 60%; LV norm.  . EXPLORATORY LAPAROTOMY    . EYE SURGERY Bilateral 2010   lens reaplcments for cataracts   . Lower Extrem. venous doppler  01/25/2012    neg.  Marland Kitchen MASTECTOMY     right side  . TOE SURGERY  1996   Bunion    Family History: Family History  Problem Relation Age of Onset  . Heart failure Father   . Heart failure Mother   . Diabetes Brother   . Breast [name] Maternal Aunt   . Breast [name] Paternal Aunt     Social History: Social History   Socioeconomic History  . Marital status: Single  Spouse name: Not on file  . Number of children: 5  . Years of education: 34  . Highest education level: Not on file  Social Needs  . Financial resource strain: Not on file  . Food insecurity - worry: Not on file  . Food insecurity - inability: Not on file  . Transportation needs - medical: Not on file  . Transportation needs - non-medical: Not on file  Occupational History  . Occupation: Disabled  Tobacco Use  . Smoking status: Former Research scientist (life sciences)  . Smokeless tobacco: Never Used  . Tobacco comment: Quit 1980  Substance and Sexual Activity  . Alcohol use: No    Alcohol/week: 0.0 oz  . Drug use: No  . Sexual activity: Not on file  Other Topics Concern  . Not on file  Social History Narrative   Lives at home alone.   Right-handed.   2-4 cups caffeine daily.    Allergies: Allergies  Allergen Reactions  . Penicillins Swelling    Has patient had a PCN reaction causing immediate rash, facial/tongue/throat swelling, SOB or lightheadedness with hypotension: Yes Has patient had a PCN reaction causing severe rash involving mucus membranes or skin necrosis: Yes Has patient had a PCN reaction that required hospitalization Yes Has patient had a  PCN reaction occurring within the last 10 years: No, more than 10 yrs ago If all of the above answers are "NO", then may proceed with Cephalosporin use.   . Fentanyl Itching  . Peach [Prunus Persica] Hives  . Shellfish Allergy Hives    Medications:  Current Outpatient Medications  Medication Sig Dispense Refill  . acetaminophen (TYLENOL) 650 MG CR tablet Take 1,300 mg by mouth every 8 (eight) hours as needed for pain.    Marland Kitchen albuterol (PROVENTIL HFA;VENTOLIN HFA) 108 (90 BASE) MCG/ACT inhaler Inhale 2 puffs into the lungs See admin instructions. Every 6 hours as needed for SOB but also takes 2 puffs BID scheduled.    Marland Kitchen albuterol (PROVENTIL) (2.5 MG/3ML) 0.083% nebulizer solution Take 2.5 mg by nebulization every 6 (six) hours as needed for wheezing or shortness of breath.     Marland Kitchen alendronate (FOSAMAX) 70 MG tablet Take 70 mg by mouth every Monday. Take with a full glass of water on an empty stomach.    Marland Kitchen allopurinol (ZYLOPRIM) 100 MG tablet Take 100 mg by mouth daily.    Marland Kitchen aspirin 325 MG tablet Take 325 mg by mouth daily.    Marland Kitchen atenolol (TENORMIN) 100 MG tablet Take 100 mg by mouth daily.     . budesonide (PULMICORT) 0.5 MG/2ML nebulizer solution Take 0.5 mg by nebulization every 6 (six) hours as needed (For wheezing.).     Marland Kitchen calcium carbonate (OS-CAL) 1250 (500 Ca) MG chewable tablet Chew 1 tablet by mouth daily.    . cetirizine (ZYRTEC) 10 MG tablet Take 10 mg by mouth daily.    . cholecalciferol (VITAMIN D) 1000 UNITS tablet Take 1,000 Units by mouth daily.    . citalopram (CELEXA) 10 MG tablet Take 10 mg by mouth daily.     . clobetasol ointment (TEMOVATE) 4.70 % Apply 1 application topically 2 (two) times daily.     Marland Kitchen desonide (DESOWEN) 0.05 % cream Apply 1 application topically 2 (two) times daily.     Marland Kitchen dexlansoprazole (DEXILANT) 60 MG capsule Take 60 mg by mouth daily.     . diclofenac sodium (VOLTAREN) 1 % GEL Apply 2 g topically daily. Reported on 09/15/2015    . diltiazem (CARTIA  XT)  300 MG 24 hr capsule Take 300 mg by mouth daily. Reported on 09/15/2015    . diphenhydrAMINE (BENADRYL) 25 MG tablet Take 25 mg by mouth every 6 (six) hours as needed for itching.     . furosemide (LASIX) 40 MG tablet Take 40 mg by mouth 2 (two) times daily.     Marland Kitchen gabapentin (NEURONTIN) 300 MG capsule Take 1 capsule (300 mg total) by mouth 2 (two) times daily. 60 capsule 6  . hydrALAZINE (APRESOLINE) 25 MG tablet Take 25 mg by mouth 3 (three) times daily.    . hydroxychloroquine (PLAQUENIL) 200 MG tablet Take 200 mg by mouth daily.     . insulin aspart (NOVOLOG) 100 UNIT/ML injection Inject 10 Units into the skin 3 (three) times daily before meals.     . insulin glargine (LANTUS) 100 UNIT/ML injection Inject 40-50 Units into the skin 2 (two) times daily. She takes 50 units in the morning and 40 units at bedtime.    . isosorbide mononitrate (IMDUR) 30 MG 24 hr tablet Take 30 mg by mouth every morning.    Marland Kitchen ketotifen (ALAWAY) 0.025 % ophthalmic solution Place 1 drop into both eyes daily.     Marland Kitchen lisinopril-hydrochlorothiazide (PRINZIDE,ZESTORETIC) 20-12.5 MG per tablet Take 1 tablet by mouth daily.    . OXYGEN Inhale into the lungs. 2L/min - prn during day and every evening.    . pravastatin (PRAVACHOL) 40 MG tablet Take 40 mg by mouth at bedtime.     . SYMBICORT 160-4.5 MCG/ACT inhaler Inhale 2 puffs into the lungs 2 (two) times daily. Reported on 09/15/2015    . tizanidine (ZANAFLEX) 2 MG capsule Take 2 mg by mouth 2 (two) times daily.    . traMADol (ULTRAM) 50 MG tablet Take 1 tablet (50 mg total) by mouth every 6 (six) hours as needed for severe pain. 60 tablet 0  . vitamin B-12 (CYANOCOBALAMIN) 1000 MCG tablet Take 1,000 mcg by mouth daily. Reported on 09/15/2015     No current facility-administered medications for this visit.     Review of Systems: Review of Systems  HENT:   Negative for hearing loss, mouth sores and tinnitus.   Eyes: Negative.   Musculoskeletal: Positive for gait problem.   Neurological: Positive for dizziness, gait problem and numbness. Negative for extremity weakness, headaches and seizures.  All other systems reviewed and are negative.    PHYSICAL EXAMINATION Blood pressure (!) 140/57, pulse 89, temperature 98 F (36.7 C), temperature source Oral, resp. rate 20, weight 249 lb (112.9 kg), SpO2 (!) 89 %.  ECOG PERFORMANCE STATUS: 2 - Symptomatic, <50% confined to bed  Physical Exam  Constitutional: She is oriented to person, place, and time and well-developed, well-nourished, and in no distress. No distress.  HENT:  Head: Normocephalic and atraumatic.  Right Ear: No drainage. No mastoid tenderness. Tympanic membrane is not perforated. No middle ear effusion.  Left Ear: No drainage. No mastoid tenderness. Tympanic membrane is perforated.  No middle ear effusion.  Mouth/Throat: Oropharynx is clear and moist. No oropharyngeal exudate.  Eyes: Conjunctivae and EOM are normal. Pupils are equal, round, and reactive to light. No scleral icterus.  Neck: No thyromegaly present.  Cardiovascular: Normal rate, regular rhythm and normal heart sounds.  No murmur heard. Pulmonary/Chest: Effort normal and breath sounds normal. No respiratory distress. She has no wheezes. She has no rales.  Abdominal: Soft. Bowel sounds are normal. She exhibits no distension. There is no tenderness. There is no rebound and  no guarding.  Musculoskeletal: She exhibits no edema.  Lymphadenopathy:    She has no cervical adenopathy.  Neurological: She is alert and oriented to person, place, and time. She has normal reflexes. No cranial nerve deficit.  Skin: Skin is warm and dry. No rash noted. She is not diaphoretic. No erythema.     LABORATORY DATA: I have personally reviewed the data as listed: Appointment on 03/15/2017  Component Date Value Ref Range Status  . WBC 03/15/2017 14.0* 3.9 - 10.3 10e3/uL Final  . NEUT# 03/15/2017 11.9* 1.5 - 6.5 10e3/uL Final  . HGB 03/15/2017 10.9* 11.6  - 15.9 g/dL Final  . HCT 03/15/2017 33.1* 34.8 - 46.6 % Final  . Platelets 03/15/2017 316  145 - 400 10e3/uL Final  . MCV 03/15/2017 86.4  79.5 - 101.0 fL Final  . MCH 03/15/2017 28.4  25.1 - 34.0 pg Final  . MCHC 03/15/2017 32.8  31.5 - 36.0 g/dL Final  . RBC 03/15/2017 3.83  3.70 - 5.45 10e6/uL Final  . RDW 03/15/2017 15.0* 11.2 - 14.5 % Final  . lymph# 03/15/2017 1.4  0.9 - 3.3 10e3/uL Final  . MONO# 03/15/2017 0.5  0.1 - 0.9 10e3/uL Final  . Eosinophils Absolute 03/15/2017 0.0  0.0 - 0.5 10e3/uL Final  . Basophils Absolute 03/15/2017 0.1  0.0 - 0.1 10e3/uL Final  . NEUT% 03/15/2017 84.7* 38.4 - 76.8 % Final  . LYMPH% 03/15/2017 10.3* 14.0 - 49.7 % Final  . MONO% 03/15/2017 3.9  0.0 - 14.0 % Final  . EOS% 03/15/2017 0.1  0.0 - 7.0 % Final  . BASO% 03/15/2017 1.0  0.0 - 2.0 % Final  . Sodium 03/15/2017 140  136 - 145 mEq/L Final  . Potassium 03/15/2017 4.3  3.5 - 5.1 mEq/L Final  . Chloride 03/15/2017 106  98 - 109 mEq/L Final  . CO2 03/15/2017 23  22 - 29 mEq/L Final  . Glucose 03/15/2017 123  70 - 140 mg/dl Final   Glucose reference range is for nonfasting patients. Fasting glucose reference range is 70- 100.  Marland Kitchen BUN 03/15/2017 36.7* 7.0 - 26.0 mg/dL Final  . Creatinine 03/15/2017 2.0* 0.6 - 1.1 mg/dL Final  . Total Bilirubin 03/15/2017 0.24  0.20 - 1.20 mg/dL Final  . Alkaline Phosphatase 03/15/2017 95  40 - 150 U/L Final  . AST 03/15/2017 22  5 - 34 U/L Final  . ALT 03/15/2017 18  0 - 55 U/L Final  . Total Protein 03/15/2017 7.8  6.4 - 8.3 g/dL Final  . Albumin 03/15/2017 3.6  3.5 - 5.0 g/dL Final  . Calcium 03/15/2017 9.8  8.4 - 10.4 mg/dL Final  . Anion Gap 03/15/2017 11  3 - 11 mEq/L Final  . EGFR 03/15/2017 28* >60 ml/min/1.73 m2 Final   eGFR is calculated using the CKD-EPI Creatinine Equation (2009)  . Uric Acid, Serum 03/15/2017 10.3* 2.6 - 7.4 mg/dl Final  . LDH 03/15/2017 371* 125 - 245 U/L Final  . Beta-2 03/15/2017 4.5* 0.6 - 2.4 mg/L Final   Siemens Immulite 2000  Immunochemiluminometric assay (ICMA)  . IgG, Qn, Serum 03/15/2017 1,520  700 - 1600 mg/dL Final  . IgA, Qn, Serum 03/15/2017 171  64 - 422 mg/dL Final  . IgM, Qn, Serum 03/15/2017 73  26 - 217 mg/dL Final  . Total Protein 03/15/2017 7.3  6.0 - 8.5 g/dL Final  . Albumin SerPl Elph-Mcnc 03/15/2017 3.3  2.9 - 4.4 g/dL Final  . Alpha 1 03/15/2017 0.2  0.0 -  0.4 g/dL Final  . Alpha2 Glob SerPl Elph-Mcnc 03/15/2017 1.1* 0.4 - 1.0 g/dL Final  . B-Globulin SerPl Elph-Mcnc 03/15/2017 1.3  0.7 - 1.3 g/dL Final  . Gamma Glob SerPl Elph-Mcnc 03/15/2017 1.4  0.4 - 1.8 g/dL Final  . M Protein SerPl Elph-Mcnc 03/15/2017 0.7* Not Observed g/dL Final  . Globulin, Total 03/15/2017 4.0* 2.2 - 3.9 g/dL Final  . Albumin/Glob SerPl 03/15/2017 0.9  0.7 - 1.7 Final  . IFE 1 03/15/2017 Comment   Final   Comment: Immunofixation shows IgG monoclonal protein with lambda light chain specificity.   . Please Note 03/15/2017 Comment   Final   Comment: Protein electrophoresis scan will follow via computer, mail, or courier delivery.   Loletha Grayer Kappa Free Light Chain 03/15/2017 27.2* 3.3 - 19.4 mg/L Final  . Ig Lambda Free Light Chain 03/15/2017 17.0  5.7 - 26.3 mg/L Final  . Kappa/Lambda FluidC Ratio 03/15/2017 1.60  0.26 - 1.65 Final       Ardath Sax, MD

## 2017-05-04 ENCOUNTER — Inpatient Hospital Stay: Payer: Medicare HMO | Attending: Hematology and Oncology | Admitting: Hematology and Oncology

## 2017-05-04 ENCOUNTER — Telehealth: Payer: Self-pay | Admitting: Hematology and Oncology

## 2017-05-04 VITALS — BP 140/63 | HR 84 | Temp 98.8°F | Resp 16 | Wt 249.0 lb

## 2017-05-04 DIAGNOSIS — C9000 Multiple myeloma not having achieved remission: Secondary | ICD-10-CM | POA: Diagnosis present

## 2017-05-04 NOTE — Telephone Encounter (Signed)
Scheduled appt per 11/7 los - Gave patient AVS and calender per los.  

## 2017-05-10 ENCOUNTER — Other Ambulatory Visit: Payer: Self-pay | Admitting: Hematology and Oncology

## 2017-05-10 MED ORDER — LORAZEPAM 0.5 MG PO TABS: 0.5000 mg | ORAL_TABLET | ORAL | 0 refills | Status: DC | PRN

## 2017-05-11 ENCOUNTER — Encounter: Payer: Self-pay | Admitting: Hematology and Oncology

## 2017-05-11 NOTE — Progress Notes (Signed)
Received PA request for Lorazepam.  Submitted via Cover My Meds:  [name] [name] Key: Rowan Blase - PA Case ID: [id number] - Rx #: [id number] Need help? Call us at [phone]  Status  Sent to [employer]  DrugLORazepam 0.5MG  OR TABS  Pending decision

## 2017-05-11 NOTE — Progress Notes (Signed)
Per Cover My Meds: Terria Deschepper Key: [facility] - PA Case ID: [id number] - Rx #: [id number] Need help? Call us at [phone]  Outcome  Approvedtoday  PA Case: [id number], Status: Approved, Coverage Starts on: 05/11/2017 12:00:00 AM, Coverage Ends on: 05/11/2018 12:00:00 AM. Questions? Contact [phone].  DrugLORazepam 0.5MG  OR TABS  [facility] Electronic PA Form  Original Claim [id number] PT. MTM ELIGIBLE. CHECK [email or web address]  Called [facility] pharmacy(Marsha) to advise of approval. She states it worked.

## 2017-05-17 ENCOUNTER — Ambulatory Visit (HOSPITAL_COMMUNITY)
Admission: RE | Admit: 2017-05-17 | Discharge: 2017-05-17 | Disposition: A | Discharge status: Home / Self Care | Payer: Medicare HMO | Source: Ambulatory Visit | Attending: Hematology and Oncology | Admitting: Hematology and Oncology

## 2017-05-17 DIAGNOSIS — D472 Monoclonal gammopathy: Secondary | ICD-10-CM | POA: Diagnosis present

## 2017-05-17 LAB — GLUCOSE, CAPILLARY: Glucose-Capillary: 249 mg/dL — ABNORMAL HIGH (ref 65–99)

## 2017-05-17 MED ORDER — FLUDEOXYGLUCOSE F - 18 (FDG) INJECTION
12.6000 | Freq: Once | INTRAVENOUS | Status: AC | PRN
  Administered 2017-05-17: 12.6 via INTRAVENOUS

## 2017-05-18 ENCOUNTER — Encounter: Payer: Self-pay | Admitting: Hematology and Oncology

## 2017-05-18 ENCOUNTER — Inpatient Hospital Stay (HOSPITAL_BASED_OUTPATIENT_CLINIC_OR_DEPARTMENT_OTHER): Payer: Medicare HMO | Admitting: Hematology and Oncology

## 2017-05-18 ENCOUNTER — Telehealth: Payer: Self-pay | Admitting: Hematology and Oncology

## 2017-05-18 VITALS — BP 129/56 | HR 85 | Temp 98.0°F | Resp 7 | Ht 64.0 in | Wt 237.7 lb

## 2017-05-18 DIAGNOSIS — C9000 Multiple myeloma not having achieved remission: Secondary | ICD-10-CM

## 2017-05-18 DIAGNOSIS — D472 Monoclonal gammopathy: Secondary | ICD-10-CM

## 2017-05-18 NOTE — Assessment & Plan Note (Signed)
77 y.o. female with new diagnosis of multiple myeloma based on the appearance of monoclonal gammopathy with IgG lambda, followed by detection of plasmacytosis in the bone marrow with predominance of lambda light chain restricted plasma cells.  At this time, we need to assess whether patient has an active disease or a smoldering form of multiple myeloma in order to decide whether treatment is indicated.  Patient has no hypercalcemia, no cytopenias significant enough to initiate the therapy.  Creatinine is elevated, but appears to be stable since May 2018.  It is also likely that kidney injury is attributable to hyperuricemia, hypertension, and diabetes.  Direct nephritis due to lupus can also be included in the differential here.  The plasma cell level in the bone marrow is reasonably low and not over the cells are consistent with underlying malignancy.  Skeletal imaging so far is rather unremarkable with only small indeterminate lucencies in the calvarium to suspect presence of lytic skeletal disease.  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  In this situation, I would like to obtain better imaging to further assess skeletal structures.  If hypermetabolic disease is the detected by PET/CT, that would change my assessment to that of an active myeloma that would warrant therapy initiation.  Plan: - PET/CT -Return to clinic 1 week following PET/CT to discuss initiation of therapy or observation.

## 2017-05-18 NOTE — Telephone Encounter (Signed)
Gave avs and calendar for april °

## 2017-05-18 NOTE — Progress Notes (Signed)
[facility] Follow-up Visit:  Assessment: Multiple myeloma ([place]) 77 y.o. female with new diagnosis of multiple myeloma based on the appearance of monoclonal gammopathy with IgG lambda, followed by detection of plasmacytosis in the bone marrow with predominance of lambda light chain restricted plasma cells.  At this time, we need to assess whether patient has an active disease or a smoldering form of multiple myeloma in order to decide whether treatment is indicated.  Patient has no hypercalcemia, no cytopenias significant enough to initiate the therapy.  Creatinine is elevated, but appears to be stable since May 2018.  It is also likely that kidney injury is attributable to hyperuricemia, hypertension, and diabetes.  Direct nephritis due to lupus can also be included in the differential here.  The plasma cell level in the bone marrow is reasonably low and not over the cells are consistent with underlying malignancy.  Skeletal imaging so far is rather unremarkable with only small indeterminate lucencies in the calvarium to suspect presence of lytic skeletal disease.  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  In this situation, I would like to obtain better imaging to further assess skeletal structures.  If hypermetabolic disease is the detected by PET/CT, that would change my assessment to that of an active myeloma that would warrant therapy initiation.  Plan: - PET/CT -Return to clinic 1 week following PET/CT to discuss initiation of therapy or observation.  Voice recognition software was used and creation of this note. Despite my best effort at editing the text, some misspelling/errors may have occurred.  Orders Placed This Encounter  Procedures  . NM PET Image Initial (PI) Whole Body    Standing Status:   Future    Number of Occurrences:   1   Standing Expiration Date:   05/04/2018    Order Specific Question:   If indicated for the ordered procedure, I authorize the administration of a radiopharmaceutical per Radiology protocol    Answer:   Yes    Order Specific Question:   Preferred imaging location?    Answer:   [facility]    Order Specific Question:   Radiology Contrast Protocol - do NOT remove file path    Answer:   [email or web address]    Order Specific Question:   Reason for Exam additional comments    Answer:   MGUS, please eval for possible myeloma    Cancer Staging Multiple myeloma ([place]) Staging form: Plasma Cell Myeloma and Plasma Cell Disorders, AJCC 8th Edition - Clinical stage from 05/04/2017: RISS Stage II (Beta-2-microglobulin (mg/L): 4.5, Albumin (g/dL): 3.3, ISS: Stage II, High-risk cytogenetics: Absent, LDH: Elevated) - Signed by Ardath Sax, MD on 05/18/2017   All questions were answered. . The patient knows to call the clinic with any problems, questions or concerns.  This note was electronically signed.    History of Presenting Illness [name] [name] is a 77 y.o. female followed in the [facility] for monoclonal gammopathy of unknown significance (MGUS), referred by Dr Philis Fendt. Patient has extensive best medical history including systemic lupus with Sjogren's syndrome, cutaneous manifestations, and positive antibodies, diabetes mellitus, hypertension, chronic kidney disease stage III, aerobatic neuropathy, history of pulmonary embolism, congestive heart failure, as well as a right-sided breast cancer treated with lumpectomy and 2006.   It appears that patient was discovered to have a monoclonal gammopathy sometime early  in 2018 or may be filled thousand 17 with recommendation by the rheumatology to be referred for hematology assessment.Please see the summary below for outline or available lab work. At the present time, patient complains mainly of pain in the  years bilaterally especially on the left side with intermittent bleeding and possible purulent drainage. Additionally, patient complains of gout episodes and peripheral neuropathy with tingling and burning in the extremities especially in her feet bilaterally. She denies unexpected weight loss. Denies any new pain in the bone structures, no new neurological, gastrointestinal, or genitourinary complaints.  Patient returns to my clinic to review the results of the interim assessment.  Patient does complain of some pain and tenderness in the right posterior scalp.  Otherwise, no new complaints.  Oncological/hematological History: **Multiple Myeloma: --Labs, 02/15/16: SPEP -- M-Spike 0.6g/dL --Feb 2018: tProt 7.3, Alb 3.6, Ca 10.3, Cr 1.6, AP 112; WBC 8.0, Hgb 11.2, Plt 284 --May 2018: tProt 7.8, Alb 3.7, Ca   9.7, Cr 1.9, AP 128; WBC 7.5, Hgb 10.3, Plt 369; --Labs, 03/15/17: tProt 7.8, Alb 3.6, Ca 9.8, Cr 2.0, AP 95, LDH 371; SPEP -- M-spike 0.7g/dL, SIFE -- positive for monoclonal IgG lambda; IgG 1520, IgA 171, IgM 73; kappa 27.2, lambda 17.0, KLR 1.6; WBC 14.0, Hgb 10.9, Plt 316;  --Skeletal Survey, 03/23/17: Multiple small calvarial lucencies, potentially representing lytic lesions of myeloma --BM Bx, 04/04/17: Hypercellular bone marrow with 12% of cellularity being represented by plasma cells. No large aggregates or sheets. Immunohistochemical stains show a polyclonal staining pattern for kappa and lambda light chains, although there is lambda light chain excess/restriction.  Overall, findings interpreted as positive for presence of plasma cell monoclonal disorder. CytoGen -- Normal 46,XX; FISH --negative for any disease-specific mutations including negative for p53 loss or loss of 13 q.  Medical History: Past Medical History:  Diagnosis Date  . Allergic rhinitis   . Anxiety   . Arthritis   . Asthma   . Cancer [facility]) 2006   breast cancer right  . CHF (congestive heart failure) ([place])   . COPD  (chronic obstructive pulmonary disease) ([place])   . Degenerative disc disease, lumbar   . Diabetes mellitus without complication ([place])   . Dyspnea    walking distances  . Eczema   . GERD (gastroesophageal reflux disease)   . Glaucoma   . History of home oxygen therapy    uses 2 liters ay night and prn  . Hyperlipidemia   . Hypertension   . Low back pain   . Lupus    skin  . Neck pain   . Numbness and tingling   . Obesity   . Osteopenia   . Pulmonary embolism ([place]) 01/2012    CT showed multi small PE and coumadized   . Sleep apnea 2010   no cpap used  . Systemic lupus erythematosus ([place])     Surgical History: Past Surgical History:  Procedure Laterality Date  . ABDOMINAL HYSTERECTOMY  1976  . BACK SURGERY  2006   lower  . BREAST BIOPSY Right   . BREAST LUMPECTOMY Right   . COLONOSCOPY WITH PROPOFOL N/A 03/23/2015   Procedure: COLONOSCOPY WITH PROPOFOL;  Surgeon: Laurence Spates, MD;  Location: [facility] ENDOSCOPY;  Service: Endoscopy;  Laterality: N/A;  . Dobtamine myoview  05/02/2008   EF 67% ; LV norm  . DOPPLER ECHOCARDIOGRAPHY  01/25/2012   EF 55 TO 60%; LV norm.  . EXPLORATORY LAPAROTOMY    . EYE SURGERY Bilateral 2010   lens  reaplcments for cataracts   . Lower Extrem. venous doppler  01/25/2012    neg.  Marland Kitchen MASTECTOMY     right side  . TOE SURGERY  1996   Bunion    Family History: Family History  Problem Relation Age of Onset  . Heart failure Father   . Heart failure Mother   . Diabetes Brother   . Breast cancer Maternal Aunt   . Breast cancer Paternal Aunt     Social History: Social History   Socioeconomic History  . Marital status: Single    Spouse name: Not on file  . Number of children: 5  . Years of education: 34  . Highest education level: Not on file  Social Needs  . Financial resource strain: Not on file  . Food insecurity - worry: Not on file  . Food insecurity - inability: Not on file  . Transportation needs - medical: Not on file  .  Transportation needs - non-medical: Not on file  Occupational History  . Occupation: Disabled  Tobacco Use  . Smoking status: Former Research scientist (life sciences)  . Smokeless tobacco: Never Used  . Tobacco comment: Quit 1980  Substance and Sexual Activity  . Alcohol use: No    Alcohol/week: 0.0 oz  . Drug use: No  . Sexual activity: Not on file  Other Topics Concern  . Not on file  Social History Narrative   Lives at home alone.   Right-handed.   2-4 cups caffeine daily.    Allergies: Allergies  Allergen Reactions  . Penicillins Swelling    Has patient had a PCN reaction causing immediate rash, facial/tongue/throat swelling, SOB or lightheadedness with hypotension: Yes Has patient had a PCN reaction causing severe rash involving mucus membranes or skin necrosis: Yes Has patient had a PCN reaction that required hospitalization Yes Has patient had a PCN reaction occurring within the last 10 years: No, more than 10 yrs ago If all of the above answers are "NO", then may proceed with Cephalosporin use.   . Fentanyl Itching  . Peach [Prunus Persica] Hives  . Shellfish Allergy Hives    Medications:  Current Outpatient Medications  Medication Sig Dispense Refill  . acetaminophen (TYLENOL) 650 MG CR tablet Take 1,300 mg by mouth every 8 (eight) hours as needed for pain.    Marland Kitchen albuterol (PROVENTIL HFA;VENTOLIN HFA) 108 (90 BASE) MCG/ACT inhaler Inhale 2 puffs into the lungs See admin instructions. Every 6 hours as needed for SOB but also takes 2 puffs BID scheduled.    Marland Kitchen albuterol (PROVENTIL) (2.5 MG/3ML) 0.083% nebulizer solution Take 2.5 mg by nebulization every 6 (six) hours as needed for wheezing or shortness of breath.     Marland Kitchen alendronate (FOSAMAX) 70 MG tablet Take 70 mg by mouth every Monday. Take with a full glass of water on an empty stomach.    Marland Kitchen allopurinol (ZYLOPRIM) 100 MG tablet Take 100 mg by mouth daily.    Marland Kitchen aspirin 325 MG tablet Take 325 mg by mouth daily.    Marland Kitchen atenolol (TENORMIN) 100 MG  tablet Take 100 mg by mouth daily.     . budesonide (PULMICORT) 0.5 MG/2ML nebulizer solution Take 0.5 mg by nebulization every 6 (six) hours as needed (For wheezing.).     Marland Kitchen calcium carbonate (OS-CAL) 1250 (500 Ca) MG chewable tablet Chew 1 tablet by mouth daily.    . cetirizine (ZYRTEC) 10 MG tablet Take 10 mg by mouth daily.    . cholecalciferol (VITAMIN D) 1000 UNITS tablet  Take 1,000 Units by mouth daily.    . citalopram (CELEXA) 10 MG tablet Take 10 mg by mouth daily.     . clobetasol ointment (TEMOVATE) 4.58 % Apply 1 application topically 2 (two) times daily.     Marland Kitchen desonide (DESOWEN) 0.05 % cream Apply 1 application topically 2 (two) times daily.     Marland Kitchen dexlansoprazole (DEXILANT) 60 MG capsule Take 60 mg by mouth daily.     . diclofenac sodium (VOLTAREN) 1 % GEL Apply 2 g topically daily. Reported on 09/15/2015    . diltiazem (CARTIA XT) 300 MG 24 hr capsule Take 300 mg by mouth daily. Reported on 09/15/2015    . diphenhydrAMINE (BENADRYL) 25 MG tablet Take 25 mg by mouth every 6 (six) hours as needed for itching.     . furosemide (LASIX) 40 MG tablet Take 40 mg by mouth 2 (two) times daily.     Marland Kitchen gabapentin (NEURONTIN) 300 MG capsule Take 1 capsule (300 mg total) by mouth 2 (two) times daily. 60 capsule 6  . hydrALAZINE (APRESOLINE) 25 MG tablet Take 25 mg by mouth 3 (three) times daily.    . hydroxychloroquine (PLAQUENIL) 200 MG tablet Take 200 mg by mouth daily.     . insulin aspart (NOVOLOG) 100 UNIT/ML injection Inject 10 Units into the skin 3 (three) times daily before meals.     . insulin glargine (LANTUS) 100 UNIT/ML injection Inject 40-50 Units into the skin 2 (two) times daily. She takes 50 units in the morning and 40 units at bedtime.    . isosorbide mononitrate (IMDUR) 30 MG 24 hr tablet Take 30 mg by mouth every morning.    Marland Kitchen ketotifen (ALAWAY) 0.025 % ophthalmic solution Place 1 drop into both eyes daily.     Marland Kitchen lisinopril-hydrochlorothiazide (PRINZIDE,ZESTORETIC) 20-12.5 MG per  tablet Take 1 tablet by mouth daily.    . OXYGEN Inhale into the lungs. 2L/min - prn during day and every evening.    . pravastatin (PRAVACHOL) 40 MG tablet Take 40 mg by mouth at bedtime.     . SYMBICORT 160-4.5 MCG/ACT inhaler Inhale 2 puffs into the lungs 2 (two) times daily. Reported on 09/15/2015    . tizanidine (ZANAFLEX) 2 MG capsule Take 2 mg by mouth 2 (two) times daily.    . traMADol (ULTRAM) 50 MG tablet Take 1 tablet (50 mg total) by mouth every 6 (six) hours as needed for severe pain. 60 tablet 0  . vitamin B-12 (CYANOCOBALAMIN) 1000 MCG tablet Take 1,000 mcg by mouth daily. Reported on 09/15/2015    . LORazepam (ATIVAN) 0.5 MG tablet Take 1 tablet (0.5 mg total) by mouth every 4 (four) hours as needed for anxiety (For anxiety with PET-CT or MRI imaging). 25 tablet 0   No current facility-administered medications for this visit.     Review of Systems: Review of Systems  HENT:   Negative for hearing loss, mouth sores and tinnitus.   Eyes: Negative.   Musculoskeletal: Positive for gait problem.  Neurological: Positive for dizziness, gait problem and numbness. Negative for extremity weakness, headaches and seizures.  All other systems reviewed and are negative.    PHYSICAL EXAMINATION Blood pressure 140/63, pulse 84, temperature 98.8 F (37.1 C), temperature source Oral, resp. rate 16, weight 249 lb (112.9 kg), SpO2 94 %.  ECOG PERFORMANCE STATUS: 2 - Symptomatic, <50% confined to bed  Physical Exam  Constitutional: She is oriented to person, place, and time and well-developed, well-nourished, and in no distress.  No distress.  HENT:  Head: Normocephalic and atraumatic.  Right Ear: No drainage. No mastoid tenderness. Tympanic membrane is not perforated. No middle ear effusion.  Left Ear: No drainage. No mastoid tenderness. Tympanic membrane is perforated.  No middle ear effusion.  Mouth/Throat: Oropharynx is clear and moist. No oropharyngeal exudate.  Eyes: Conjunctivae and  EOM are normal. Pupils are equal, round, and reactive to light. No scleral icterus.  Neck: No thyromegaly present.  Cardiovascular: Normal rate, regular rhythm and normal heart sounds.  No murmur heard. Pulmonary/Chest: Effort normal and breath sounds normal. No respiratory distress. She has no wheezes. She has no rales.  Abdominal: Soft. Bowel sounds are normal. She exhibits no distension. There is no tenderness. There is no rebound and no guarding.  Musculoskeletal: She exhibits no edema.  Lymphadenopathy:    She has no cervical adenopathy.  Neurological: She is alert and oriented to person, place, and time. She has normal reflexes. No cranial nerve deficit.  Skin: Skin is warm and dry. No rash noted. She is not diaphoretic. No erythema.     LABORATORY DATA: I have personally reviewed the data as listed: No visits with results within 1 Week(s) from this visit.  Latest known visit with results is:  Hospital Outpatient Visit on 04/04/2017  Component Date Value Ref Range Status  . WBC 04/04/2017 8.7  4.0 - 10.5 K/uL Final  . RBC 04/04/2017 3.74* 3.87 - 5.11 MIL/uL Final  . Hemoglobin 04/04/2017 10.8* 12.0 - 15.0 g/dL Final  . HCT 04/04/2017 33.1* 36.0 - 46.0 % Final  . MCV 04/04/2017 88.5  78.0 - 100.0 fL Final  . MCH 04/04/2017 28.9  26.0 - 34.0 pg Final  . MCHC 04/04/2017 32.6  30.0 - 36.0 g/dL Final  . RDW 04/04/2017 15.0  11.5 - 15.5 % Final  . Platelets 04/04/2017 316  150 - 400 K/uL Final  . Neutrophils Relative % 04/04/2017 69  % Final  . Neutro Abs 04/04/2017 6.1  1.7 - 7.7 K/uL Final  . Lymphocytes Relative 04/04/2017 24  % Final  . Lymphs Abs 04/04/2017 2.1  0.7 - 4.0 K/uL Final  . Monocytes Relative 04/04/2017 4  % Final  . Monocytes Absolute 04/04/2017 0.3  0.1 - 1.0 K/uL Final  . Eosinophils Relative 04/04/2017 3  % Final  . Eosinophils Absolute 04/04/2017 0.2  0.0 - 0.7 K/uL Final  . Basophils Relative 04/04/2017 0  % Final  . Basophils Absolute 04/04/2017 0.0  0.0 -  0.1 K/uL Final  . Prothrombin Time 04/04/2017 13.0  11.4 - 15.2 seconds Final  . INR 04/04/2017 0.99   Final  . Chromosome-Routine 04/04/2017 SEE SEPARATE REPORT   Final   Performed at [facility]  . Tissue hybridization (bone marrow)* 04/04/2017 SEE SEPARATE REPORT   Final   Performed at [facility]       Ardath Sax, MD

## 2017-06-04 NOTE — Progress Notes (Signed)
[facility] Follow-up Visit:  Assessment: Smoldering multiple myeloma ([place]) 77 y.o. female with new diagnosis of multiple myeloma based on the appearance of monoclonal gammopathy with IgG lambda, followed by detection of plasmacytosis in the bone marrow with predominance of lambda light chain restricted plasma cells. Patient has no hypercalcemia, no cytopenias significant enough to initiate the therapy.  Creatinine is elevated, but appears to be stable since May 2018.  It is also likely that kidney injury is attributable to hyperuricemia, hypertension, and diabetes.  Direct nephritis due to lupus can also be included in the differential here.  The plasma cell level in the bone marrow is reasonably low.  Skeletal imaging so far is rather unremarkable with only small indeterminate lucencies in the calvarium to suspect presence of lytic skeletal disease with no associated hypermetabolism on PET/CT.  My overall impression is that of smoldering multiple myeloma that warrants observation instead of treatment initiation at this point in time.  Plan: -Initiate observation -Return to clinic in 3 months: Lab work 1 week prior, clinic visit for hematological monitoring.  Voice recognition software was used and creation of this note. Despite my best effort at editing the text, some misspelling/errors may have occurred.  Orders Placed This Encounter  Procedures  . CBC with Differential (Cancer Center Only)    Standing Status:   Future    Standing Expiration Date:   05/18/2018  . CMP ([facility] only)    Standing Status:   Future    Standing Expiration Date:   05/18/2018  . Lactate dehydrogenase (LDH)    Standing Status:   Future    Standing Expiration Date:   05/18/2018  . Uric acid    Standing Status:   Future    Standing Expiration Date:   05/18/2018  . Beta 2 microglobulin    Standing Status:   Future    Standing Expiration Date:   05/18/2018  . Multiple Myeloma Panel (SPEP&IFE  w/QIG)    Standing Status:   Future    Standing Expiration Date:   05/18/2018  . Kappa/lambda light chains    Standing Status:   Future    Standing Expiration Date:   05/18/2018  . 24-Hr Ur UPEP/UIFE/Light Chains/TP    Standing Status:   Future    Standing Expiration Date:   05/18/2018    Cancer Staging Multiple myeloma ([place]) Staging form: Plasma Cell Myeloma and Plasma Cell Disorders, AJCC 8th Edition - Clinical stage from 05/04/2017: RISS Stage II (Beta-2-microglobulin (mg/L): 4.5, Albumin (g/dL): 3.3, ISS: Stage II, High-risk cytogenetics: Absent, LDH: Elevated) - Signed by Ardath Sax, MD on 05/18/2017   All questions were answered. . The patient knows to call the clinic with any problems, questions or concerns.  This note was electronically signed.    History of Presenting Illness [name] [name] is a 77 y.o. female followed in the [facility] for monoclonal gammopathy of unknown significance (MGUS), referred by Dr Philis Fendt. Patient has extensive best medical history including systemic lupus with Sjogren's syndrome, cutaneous manifestations, and positive antibodies, diabetes mellitus, hypertension, chronic kidney disease stage III, aerobatic neuropathy, history of pulmonary embolism, congestive heart failure, as well as a right-sided breast cancer treated with lumpectomy and 2006.   It appears that patient was discovered to have a monoclonal gammopathy sometime early in 2018 or may be filled thousand 17 with recommendation by the rheumatology to be referred for hematology assessment.Please see the summary below for outline or available lab work.  At the present time, patient complains mainly of pain in the years bilaterally especially on the left side with intermittent bleeding and possible purulent drainage. Additionally, patient complains of gout episodes and peripheral neuropathy with tingling and burning in the extremities especially in her feet bilaterally. She denies  unexpected weight loss. Denies any new pain in the bone structures, no new neurological, gastrointestinal, or genitourinary complaints.  Patient returns to my clinic to review the results of the interim assessment.  Patient does complain of some pain and tenderness in the right posterior scalp.  Otherwise, no new complaints.  Oncological/hematological History: **Multiple Myeloma, Smoldering: --Labs, 02/15/16: SPEP -- M-Spike 0.6g/dL --Feb 2018: tProt 7.3, Alb 3.6, Ca 10.3, Cr 1.6, AP 112; WBC 8.0, Hgb 11.2, Plt 284 --May 2018: tProt 7.8, Alb 3.7, Ca   9.7, Cr 1.9, AP 128; WBC 7.5, Hgb 10.3, Plt 369; --Labs, 03/15/17: tProt 7.8, Alb 3.6, Ca 9.8, Cr 2.0, AP 95, LDH 371; SPEP -- M-spike 0.7g/dL, SIFE -- positive for monoclonal IgG lambda; IgG 1520, IgA 171, IgM 73; kappa 27.2, lambda 17.0, KLR 1.6; WBC 14.0, Hgb 10.9, Plt 316;  --Skeletal Survey, 03/23/17: Multiple small calvarial lucencies, potentially representing lytic lesions of myeloma --BM Bx, 04/04/17: Hypercellular bone marrow with 12% of cellularity being represented by plasma cells. No large aggregates or sheets. Immunohistochemical stains show a polyclonal staining pattern for kappa and lambda light chains, although there is lambda light chain excess/restriction.  Overall, findings interpreted as positive for presence of plasma cell monoclonal disorder. CytoGen -- Normal 46,XX; FISH --negative for any disease-specific mutations including negative for p53 loss or loss of 13 q. --PET-CT, 05/18/17: No evidence of hypermetabolic skeletal lesions.  Medical History: Past Medical History:  Diagnosis Date  . Allergic rhinitis   . Anxiety   . Arthritis   . Asthma   . Cancer [facility]) 2006   breast cancer right  . CHF (congestive heart failure) ([place])   . COPD (chronic obstructive pulmonary disease) ([place])   . Degenerative disc disease, lumbar   . Diabetes mellitus without complication ([place])   . Dyspnea    walking distances  . Eczema   . GERD  (gastroesophageal reflux disease)   . Glaucoma   . History of home oxygen therapy    uses 2 liters ay night and prn  . Hyperlipidemia   . Hypertension   . Low back pain   . Lupus    skin  . Neck pain   . Numbness and tingling   . Obesity   . Osteopenia   . Pulmonary embolism ([place]) 01/2012    CT showed multi small PE and coumadized   . Sleep apnea 2010   no cpap used  . Systemic lupus erythematosus ([place])     Surgical History: Past Surgical History:  Procedure Laterality Date  . ABDOMINAL HYSTERECTOMY  1976  . BACK SURGERY  2006   lower  . BREAST BIOPSY Right   . BREAST LUMPECTOMY Right   . COLONOSCOPY WITH PROPOFOL N/A 03/23/2015   Procedure: COLONOSCOPY WITH PROPOFOL;  Surgeon: Laurence Spates, MD;  Location: [facility];  Service: Endoscopy;  Laterality: N/A;  . Dobtamine myoview  05/02/2008   EF 67% ; LV norm  . DOPPLER ECHOCARDIOGRAPHY  01/25/2012   EF 55 TO 60%; LV norm.  . EXPLORATORY LAPAROTOMY    . EYE SURGERY Bilateral 2010   lens reaplcments for cataracts   . Lower Extrem. venous doppler  01/25/2012    neg.  Marland Kitchen MASTECTOMY  right side  . TOE SURGERY  1996   Bunion    Family History: Family History  Problem Relation Age of Onset  . Heart failure Father   . Heart failure Mother   . Diabetes Brother   . Breast cancer Maternal Aunt   . Breast cancer Paternal Aunt     Social History: Social History   Socioeconomic History  . Marital status: Single    Spouse name: Not on file  . Number of children: 5  . Years of education: 36  . Highest education level: Not on file  Social Needs  . Financial resource strain: Not on file  . Food insecurity - worry: Not on file  . Food insecurity - inability: Not on file  . Transportation needs - medical: Not on file  . Transportation needs - non-medical: Not on file  Occupational History  . Occupation: Disabled  Tobacco Use  . Smoking status: Former Research scientist (life sciences)  . Smokeless tobacco: Never Used  . Tobacco comment:  Quit 1980  Substance and Sexual Activity  . Alcohol use: No    Alcohol/week: 0.0 oz  . Drug use: No  . Sexual activity: Not on file  Other Topics Concern  . Not on file  Social History Narrative   Lives at home alone.   Right-handed.   2-4 cups caffeine daily.    Allergies: Allergies  Allergen Reactions  . Penicillins Swelling    Has patient had a PCN reaction causing immediate rash, facial/tongue/throat swelling, SOB or lightheadedness with hypotension: Yes Has patient had a PCN reaction causing severe rash involving mucus membranes or skin necrosis: Yes Has patient had a PCN reaction that required hospitalization Yes Has patient had a PCN reaction occurring within the last 10 years: No, more than 10 yrs ago If all of the above answers are "NO", then may proceed with Cephalosporin use.   . Fentanyl Itching  . Peach [Prunus Persica] Hives  . Shellfish Allergy Hives    Medications:  Current Outpatient Medications  Medication Sig Dispense Refill  . acetaminophen (TYLENOL) 650 MG CR tablet Take 1,300 mg by mouth every 8 (eight) hours as needed for pain.    Marland Kitchen albuterol (PROVENTIL HFA;VENTOLIN HFA) 108 (90 BASE) MCG/ACT inhaler Inhale 2 puffs into the lungs See admin instructions. Every 6 hours as needed for SOB but also takes 2 puffs BID scheduled.    Marland Kitchen albuterol (PROVENTIL) (2.5 MG/3ML) 0.083% nebulizer solution Take 2.5 mg by nebulization every 6 (six) hours as needed for wheezing or shortness of breath.     Marland Kitchen alendronate (FOSAMAX) 70 MG tablet Take 70 mg by mouth every Monday. Take with a full glass of water on an empty stomach.    Marland Kitchen allopurinol (ZYLOPRIM) 100 MG tablet Take 100 mg by mouth daily.    Marland Kitchen aspirin 325 MG tablet Take 325 mg by mouth daily.    Marland Kitchen atenolol (TENORMIN) 100 MG tablet Take 100 mg by mouth daily.     . budesonide (PULMICORT) 0.5 MG/2ML nebulizer solution Take 0.5 mg by nebulization every 6 (six) hours as needed (For wheezing.).     Marland Kitchen calcium carbonate  (OS-CAL) 1250 (500 Ca) MG chewable tablet Chew 1 tablet by mouth daily.    . cetirizine (ZYRTEC) 10 MG tablet Take 10 mg by mouth daily.    . cholecalciferol (VITAMIN D) 1000 UNITS tablet Take 1,000 Units by mouth daily.    . citalopram (CELEXA) 10 MG tablet Take 10 mg by mouth daily.     Marland Kitchen  clobetasol ointment (TEMOVATE) 2.53 % Apply 1 application topically 2 (two) times daily.     Marland Kitchen desonide (DESOWEN) 0.05 % cream Apply 1 application topically 2 (two) times daily.     Marland Kitchen dexlansoprazole (DEXILANT) 60 MG capsule Take 60 mg by mouth daily.     . diclofenac sodium (VOLTAREN) 1 % GEL Apply 2 g topically daily. Reported on 09/15/2015    . diltiazem (CARTIA XT) 300 MG 24 hr capsule Take 300 mg by mouth daily. Reported on 09/15/2015    . diphenhydrAMINE (BENADRYL) 25 MG tablet Take 25 mg by mouth every 6 (six) hours as needed for itching.     . furosemide (LASIX) 40 MG tablet Take 40 mg by mouth 2 (two) times daily.     Marland Kitchen gabapentin (NEURONTIN) 300 MG capsule Take 1 capsule (300 mg total) by mouth 2 (two) times daily. 60 capsule 6  . hydrALAZINE (APRESOLINE) 25 MG tablet Take 25 mg by mouth 3 (three) times daily.    . hydroxychloroquine (PLAQUENIL) 200 MG tablet Take 200 mg by mouth daily.     . insulin aspart (NOVOLOG) 100 UNIT/ML injection Inject 10 Units into the skin 3 (three) times daily before meals.     . insulin glargine (LANTUS) 100 UNIT/ML injection Inject 40-50 Units into the skin 2 (two) times daily. She takes 50 units in the morning and 40 units at bedtime.    . isosorbide mononitrate (IMDUR) 30 MG 24 hr tablet Take 30 mg by mouth every morning.    Marland Kitchen ketotifen (ALAWAY) 0.025 % ophthalmic solution Place 1 drop into both eyes daily.     Marland Kitchen lisinopril-hydrochlorothiazide (PRINZIDE,ZESTORETIC) 20-12.5 MG per tablet Take 1 tablet by mouth daily.    Marland Kitchen LORazepam (ATIVAN) 0.5 MG tablet Take 1 tablet (0.5 mg total) by mouth every 4 (four) hours as needed for anxiety (For anxiety with PET-CT or MRI  imaging). 25 tablet 0  . OXYGEN Inhale into the lungs. 2L/min - prn during day and every evening.    . pravastatin (PRAVACHOL) 40 MG tablet Take 40 mg by mouth at bedtime.     . SYMBICORT 160-4.5 MCG/ACT inhaler Inhale 2 puffs into the lungs 2 (two) times daily. Reported on 09/15/2015    . tizanidine (ZANAFLEX) 2 MG capsule Take 2 mg by mouth 2 (two) times daily.    . traMADol (ULTRAM) 50 MG tablet Take 1 tablet (50 mg total) by mouth every 6 (six) hours as needed for severe pain. 60 tablet 0  . vitamin B-12 (CYANOCOBALAMIN) 1000 MCG tablet Take 1,000 mcg by mouth daily. Reported on 09/15/2015     No current facility-administered medications for this visit.     Review of Systems: Review of Systems  HENT:   Negative for hearing loss, mouth sores and tinnitus.   Eyes: Negative.   Musculoskeletal: Positive for gait problem.  Neurological: Positive for dizziness, gait problem and numbness. Negative for extremity weakness, headaches and seizures.  All other systems reviewed and are negative.    PHYSICAL EXAMINATION Blood pressure (!) 129/56, pulse 85, temperature 98 F (36.7 C), temperature source Oral, resp. rate (!) 7, height '5\' 4"'$  (1.626 m), weight 237 lb 11.2 oz (107.8 kg), SpO2 95 %.  ECOG PERFORMANCE STATUS: 2 - Symptomatic, <50% confined to bed  Physical Exam  Constitutional: She is oriented to person, place, and time and well-developed, well-nourished, and in no distress. No distress.  HENT:  Head: Normocephalic and atraumatic.  Right Ear: No drainage. No mastoid tenderness.  Tympanic membrane is not perforated. No middle ear effusion.  Left Ear: No drainage. No mastoid tenderness. Tympanic membrane is perforated.  No middle ear effusion.  Mouth/Throat: Oropharynx is clear and moist. No oropharyngeal exudate.  Eyes: Conjunctivae and EOM are normal. Pupils are equal, round, and reactive to light. No scleral icterus.  Neck: No thyromegaly present.  Cardiovascular: Normal rate,  regular rhythm and normal heart sounds.  No murmur heard. Pulmonary/Chest: Effort normal and breath sounds normal. No respiratory distress. She has no wheezes. She has no rales.  Abdominal: Soft. Bowel sounds are normal. She exhibits no distension. There is no tenderness. There is no rebound and no guarding.  Musculoskeletal: She exhibits no edema.  Lymphadenopathy:    She has no cervical adenopathy.  Neurological: She is alert and oriented to person, place, and time. She has normal reflexes. No cranial nerve deficit.  Skin: Skin is warm and dry. No rash noted. She is not diaphoretic. No erythema.     LABORATORY DATA: I have personally reviewed the data as listed: Hospital Outpatient Visit on 05/17/2017  Component Date Value Ref Range Status  . Glucose-Capillary 05/17/2017 249* 65 - 99 mg/dL Final       Ardath Sax, MD

## 2017-06-04 NOTE — Assessment & Plan Note (Signed)
77 y.o. female with new diagnosis of multiple myeloma based on the appearance of monoclonal gammopathy with IgG lambda, followed by detection of plasmacytosis in the bone marrow with predominance of lambda light chain restricted plasma cells. Patient has no hypercalcemia, no cytopenias significant enough to initiate the therapy.  Creatinine is elevated, but appears to be stable since May 2018.  It is also likely that kidney injury is attributable to hyperuricemia, hypertension, and diabetes.  Direct nephritis due to lupus can also be included in the differential here.  The plasma cell level in the bone marrow is reasonably low.  Skeletal imaging so far is rather unremarkable with only small indeterminate lucencies in the calvarium to suspect presence of lytic skeletal disease with no associated hypermetabolism on PET/CT.  My overall impression is that of smoldering multiple myeloma that warrants observation instead of treatment initiation at this point in time.  Plan: -Initiate observation -Return to clinic in 3 months: Lab work 1 week prior, clinic visit for hematological monitoring. 

## 2017-06-09 ENCOUNTER — Other Ambulatory Visit: Payer: Self-pay | Admitting: Internal Medicine

## 2017-06-09 DIAGNOSIS — Z1231 Encounter for screening mammogram for malignant neoplasm of breast: Secondary | ICD-10-CM

## 2017-06-15 DIAGNOSIS — N183 Chronic kidney disease, stage 3 (moderate): Secondary | ICD-10-CM | POA: Diagnosis not present

## 2017-06-29 ENCOUNTER — Ambulatory Visit
Admission: RE | Admit: 2017-06-29 | Discharge: 2017-06-29 | Disposition: A | Discharge status: Home / Self Care | Payer: Medicare HMO | Source: Ambulatory Visit | Attending: Internal Medicine | Admitting: Internal Medicine

## 2017-06-29 DIAGNOSIS — Z1231 Encounter for screening mammogram for malignant neoplasm of breast: Secondary | ICD-10-CM

## 2017-06-30 ENCOUNTER — Other Ambulatory Visit: Payer: Self-pay | Admitting: Internal Medicine

## 2017-06-30 DIAGNOSIS — R928 Other abnormal and inconclusive findings on diagnostic imaging of breast: Secondary | ICD-10-CM

## 2017-07-04 ENCOUNTER — Other Ambulatory Visit: Payer: Self-pay | Admitting: Internal Medicine

## 2017-07-04 ENCOUNTER — Other Ambulatory Visit: Payer: Self-pay

## 2017-07-04 DIAGNOSIS — R928 Other abnormal and inconclusive findings on diagnostic imaging of breast: Secondary | ICD-10-CM

## 2017-07-05 ENCOUNTER — Ambulatory Visit: Payer: Medicare HMO

## 2017-07-05 ENCOUNTER — Ambulatory Visit (INDEPENDENT_AMBULATORY_CARE_PROVIDER_SITE_OTHER): Payer: Medicare HMO | Admitting: Specialist

## 2017-07-05 ENCOUNTER — Encounter (INDEPENDENT_AMBULATORY_CARE_PROVIDER_SITE_OTHER): Payer: Self-pay | Admitting: Specialist

## 2017-07-05 ENCOUNTER — Ambulatory Visit: Admission: RE | Admit: 2017-07-05 | Payer: Medicare HMO | Source: Ambulatory Visit

## 2017-07-05 VITALS — BP 119/63 | HR 72

## 2017-07-05 DIAGNOSIS — M4326 Fusion of spine, lumbar region: Secondary | ICD-10-CM | POA: Diagnosis not present

## 2017-07-05 DIAGNOSIS — M5136 Other intervertebral disc degeneration, lumbar region: Secondary | ICD-10-CM

## 2017-07-05 DIAGNOSIS — M47816 Spondylosis without myelopathy or radiculopathy, lumbar region: Secondary | ICD-10-CM

## 2017-07-05 NOTE — Progress Notes (Signed)
Office Visit Note   Patient: [name] [name]           Date of Birth: December 29, 1940           MRN: [id number] Visit Date: 07/05/2017              Requested by: Nolene Ebbs, MD [address] [place], [place] [address] PCP: Nolene Ebbs, MD   Assessment & Plan: Visit Diagnoses:  1. Degenerative disc disease, lumbar   2. Spondylosis without myelopathy or radiculopathy, lumbar region   3. Fusion of lumbar spine     Plan:Avoid bending, stooping and avoid lifting weights greater than 10 lbs. Avoid prolong standing and walking. Avoid frequent bending and stooping  No lifting greater than 10 lbs. May use ice or moist heat for pain. Weight loss is of benefit. Handicap license is approved.  Follow-Up Instructions: Return in about 6 months (around 01/05/2018).   Orders:  No orders of the defined types were placed in this encounter.  No orders of the defined types were placed in this encounter.     Procedures: No procedures performed   Clinical Data: No additional findings.   Subjective: Chief Complaint  Patient presents with  . Follow-up    77 year old female with history of back pain. She relates she is tending to stoop. She feels pain in the top of the spine and she has pain into the left scapula and occasionally into the left arm. With numbness and tingling into the left arm. She has difficulty lifting the left shoulder, and difficulty sleeping on the left shoulder.  She has had evaluation with a bone marrow and the results showed 12% plasmacytosis and she has been told that this will need continue surviellance the situation.     Review of Systems  Constitutional: Positive for activity change. Negative for appetite change, chills, diaphoresis, fatigue, fever and unexpected weight change.  HENT: Positive for congestion, rhinorrhea, sinus pressure and sinus pain. Negative for dental problem, drooling, ear discharge, ear pain, facial swelling, hearing loss, mouth sores,  nosebleeds, postnasal drip, sneezing, sore throat, tinnitus, trouble swallowing and voice change.   Eyes: Positive for redness and itching. Negative for photophobia, pain, discharge and visual disturbance.  Respiratory: Negative.  Negative for apnea, cough, choking, chest tightness, shortness of breath, wheezing and stridor.   Cardiovascular: Negative.  Negative for chest pain, palpitations and leg swelling.  Gastrointestinal: Negative.  Negative for abdominal distention, abdominal pain, anal bleeding, blood in stool, constipation, diarrhea, nausea, rectal pain and vomiting.  Endocrine: Negative.  Negative for cold intolerance, heat intolerance, polydipsia, polyphagia and polyuria.  Genitourinary: Negative.  Negative for difficulty urinating, dyspareunia, dysuria, enuresis, flank pain, frequency, genital sores and hematuria.  Musculoskeletal: Negative.  Negative for arthralgias, back pain, gait problem, joint swelling, myalgias, neck pain and neck stiffness.  Skin: Negative.  Negative for color change, pallor, rash and wound.  Allergic/Immunologic: Negative.  Negative for environmental allergies, food allergies and immunocompromised state.  Neurological: Negative for dizziness, tremors, seizures, syncope, facial asymmetry, speech difficulty, weakness, light-headedness, numbness and headaches.  Hematological: Negative.  Negative for adenopathy. Does not bruise/bleed easily.  Psychiatric/Behavioral: Negative.  Negative for agitation, behavioral problems, confusion, decreased concentration, dysphoric mood, hallucinations, self-injury, sleep disturbance and suicidal ideas. The patient is not nervous/anxious and is not hyperactive.      Objective: Vital Signs: BP 119/63   Pulse 72   Physical Exam  Constitutional: She is oriented to person, place, and time. She appears well-developed and  well-nourished.  HENT:  Head: Normocephalic and atraumatic.  Eyes: EOM are normal. Pupils are equal, round, and  reactive to light.  Neck: Normal range of motion. Neck supple.  Pulmonary/Chest: Effort normal and breath sounds normal.  Abdominal: Soft. Bowel sounds are normal.  Musculoskeletal: Normal range of motion.  Neurological: She is alert and oriented to person, place, and time.  Skin: Skin is warm and dry.  Psychiatric: She has a normal mood and affect. Her behavior is normal. Judgment and thought content normal.    Back Exam   Tenderness  The patient is experiencing tenderness in the lumbar.  Range of Motion  Extension: normal  Flexion: normal  Lateral bend right: normal  Lateral bend left: normal  Rotation right: normal  Rotation left: normal   Muscle Strength  Right Quadriceps:  5/5  Left Quadriceps:  5/5  Right Hamstrings:  5/5  Left Hamstrings:  5/5   Tests  Straight leg raise right: negative Straight leg raise left: negative  Reflexes  Patellar: abnormal Achilles: abnormal Babinski's sign: normal   Other  Toe walk: normal Heel walk: normal Gait: normal  Scars: absent      Specialty Comments:  No specialty comments available.  Imaging: No results found.   PMFS History: Patient Active Problem List   Diagnosis Date Noted  . Smoldering multiple myeloma ([place]) 04/17/2017  . Abnormality of gait 08/05/2015  . Spinal stenosis of lumbar region 08/05/2015  . Peripheral neuropathy 08/05/2015  . Chest pain 12/28/2013  . Hyperlipidemia 04/05/2013  . Systemic lupus erythematosus ([place]) 04/05/2013  . Pulmonary embolism ([place]) 01/24/2012  . Chronic diastolic heart failure ([place]) 01/24/2012  . COPD (chronic obstructive pulmonary disease) ([place]) 01/24/2012  . Diabetes mellitus ([place]) 01/24/2012  . Hypertension 01/24/2012  . Leukocytosis 01/24/2012  . Bronchitis 01/24/2012  . Anemia 01/24/2012   Past Medical History:  Diagnosis Date  . Allergic rhinitis   . Anxiety   . Arthritis   . Asthma   . Cancer [facility]) 2006   breast cancer right  . CHF (congestive heart  failure) ([place])   . COPD (chronic obstructive pulmonary disease) ([place])   . Degenerative disc disease, lumbar   . Diabetes mellitus without complication ([place])   . Dyspnea    walking distances  . Eczema   . GERD (gastroesophageal reflux disease)   . Glaucoma   . History of home oxygen therapy    uses 2 liters ay night and prn  . Hyperlipidemia   . Hypertension   . Low back pain   . Lupus    skin  . Neck pain   . Numbness and tingling   . Obesity   . Osteopenia   . Pulmonary embolism ([place]) 01/2012    CT showed multi small PE and coumadized   . Sleep apnea 2010   no cpap used  . Systemic lupus erythematosus ([facility])     Family History  Problem Relation Age of Onset  . Heart failure Father   . Heart failure Mother   . Diabetes Brother   . Breast cancer Maternal Aunt   . Breast cancer Paternal Aunt     Past Surgical History:  Procedure Laterality Date  . ABDOMINAL HYSTERECTOMY  1976  . BACK SURGERY  2006   lower  . BREAST BIOPSY Right   . BREAST LUMPECTOMY Right   . COLONOSCOPY WITH PROPOFOL N/A 03/23/2015   Procedure: COLONOSCOPY WITH PROPOFOL;  Surgeon: Laurence Spates, MD;  Location: [facility];  Service: Endoscopy;  Laterality: N/A;  . Dobtamine myoview  05/02/2008   EF 67% ; LV norm  . DOPPLER ECHOCARDIOGRAPHY  01/25/2012   EF 55 TO 60%; LV norm.  . EXPLORATORY LAPAROTOMY    . EYE SURGERY Bilateral 2010   lens reaplcments for cataracts   . Lower Extrem. venous doppler  01/25/2012    neg.  Marland Kitchen MASTECTOMY     right side  . TOE SURGERY  1996   Bunion   Social History   Occupational History  . Occupation: Disabled  Tobacco Use  . Smoking status: Former Research scientist (life sciences)  . Smokeless tobacco: Never Used  . Tobacco comment: Quit 1980  Substance and Sexual Activity  . Alcohol use: No    Alcohol/week: 0.0 oz  . Drug use: No  . Sexual activity: Not on file

## 2017-07-05 NOTE — Patient Instructions (Signed)
Avoid bending, stooping and avoid lifting weights greater than 10 lbs. Avoid prolong standing and walking. Avoid frequent bending and stooping  No lifting greater than 10 lbs. May use ice or moist heat for pain. Weight loss is of benefit. Handicap license is approved.  

## 2017-07-17 ENCOUNTER — Ambulatory Visit
Admission: RE | Admit: 2017-07-17 | Discharge: 2017-07-17 | Disposition: A | Discharge status: Home / Self Care | Payer: Medicare HMO | Source: Ambulatory Visit | Attending: Internal Medicine | Admitting: Internal Medicine

## 2017-07-17 ENCOUNTER — Ambulatory Visit: Payer: Medicare HMO

## 2017-07-17 DIAGNOSIS — R928 Other abnormal and inconclusive findings on diagnostic imaging of breast: Secondary | ICD-10-CM

## 2017-07-24 ENCOUNTER — Other Ambulatory Visit: Payer: Self-pay | Admitting: Internal Medicine

## 2017-08-08 ENCOUNTER — Inpatient Hospital Stay: Payer: Medicare HMO | Attending: Hematology and Oncology

## 2017-08-08 DIAGNOSIS — C9000 Multiple myeloma not having achieved remission: Secondary | ICD-10-CM

## 2017-08-08 DIAGNOSIS — D472 Monoclonal gammopathy: Secondary | ICD-10-CM | POA: Insufficient documentation

## 2017-08-08 LAB — CBC WITH DIFFERENTIAL (CANCER CENTER ONLY)
BASOS PCT: 0 %
Basophils Absolute: 0 10*3/uL (ref 0.0–0.1)
EOS ABS: 0.3 10*3/uL (ref 0.0–0.5)
Eosinophils Relative: 3 %
HCT: 32.4 % — ABNORMAL LOW (ref 34.8–46.6)
HEMOGLOBIN: 10.3 g/dL — AB (ref 11.6–15.9)
LYMPHS ABS: 1.8 10*3/uL (ref 0.9–3.3)
Lymphocytes Relative: 22 %
MCH: 27.8 pg (ref 25.1–34.0)
MCHC: 31.8 g/dL (ref 31.5–36.0)
MCV: 87.3 fL (ref 79.5–101.0)
Monocytes Absolute: 0.4 10*3/uL (ref 0.1–0.9)
Monocytes Relative: 5 %
Neutro Abs: 5.4 10*3/uL (ref 1.5–6.5)
Neutrophils Relative %: 70 %
Platelet Count: 292 10*3/uL (ref 145–400)
RBC: 3.71 MIL/uL (ref 3.70–5.45)
RDW: 14.7 % — ABNORMAL HIGH (ref 11.2–14.5)
WBC Count: 7.8 10*3/uL (ref 3.9–10.3)

## 2017-08-08 LAB — CMP (CANCER CENTER ONLY)
ALBUMIN: 3.7 g/dL (ref 3.5–5.0)
ALK PHOS: 84 U/L (ref 40–150)
ALT: 17 U/L (ref 0–55)
AST: 18 U/L (ref 5–34)
Anion gap: 13 — ABNORMAL HIGH (ref 3–11)
BUN: 30 mg/dL — ABNORMAL HIGH (ref 7–26)
CALCIUM: 10.5 mg/dL — AB (ref 8.4–10.4)
CO2: 21 mmol/L — ABNORMAL LOW (ref 22–29)
CREATININE: 1.95 mg/dL — AB (ref 0.60–1.10)
Chloride: 105 mmol/L (ref 98–109)
GFR, Est AFR Am: 28 mL/min — ABNORMAL LOW (ref >60)
GFR, Estimated: 24 mL/min — ABNORMAL LOW (ref >60)
GLUCOSE: 389 mg/dL — AB (ref 70–140)
Potassium: 4.7 mmol/L (ref 3.5–5.1)
Sodium: 139 mmol/L (ref 136–145)
Total Bilirubin: 0.3 mg/dL (ref 0.2–1.2)
Total Protein: 7.7 g/dL (ref 6.4–8.3)

## 2017-08-08 LAB — LACTATE DEHYDROGENASE: LDH: 339 U/L — ABNORMAL HIGH (ref 125–245)

## 2017-08-08 LAB — URIC ACID: Uric Acid, Serum: 9.3 mg/dL — ABNORMAL HIGH (ref 2.6–7.4)

## 2017-08-09 LAB — BETA 2 MICROGLOBULIN, SERUM: BETA 2 MICROGLOBULIN: 4.3 mg/L — AB (ref 0.6–2.4)

## 2017-08-09 LAB — KAPPA/LAMBDA LIGHT CHAINS
KAPPA, LAMDA LIGHT CHAIN RATIO: 1.31 (ref 0.26–1.65)
Kappa free light chain: 33.5 mg/L — ABNORMAL HIGH (ref 3.3–19.4)
LAMDA FREE LIGHT CHAINS: 25.6 mg/L (ref 5.7–26.3)

## 2017-08-11 LAB — MULTIPLE MYELOMA PANEL, SERUM
ALBUMIN/GLOB SERPL: 0.9 (ref 0.7–1.7)
Albumin SerPl Elph-Mcnc: 3.4 g/dL (ref 2.9–4.4)
Alpha 1: 0.2 g/dL (ref 0.0–0.4)
Alpha2 Glob SerPl Elph-Mcnc: 1 g/dL (ref 0.4–1.0)
B-Globulin SerPl Elph-Mcnc: 1.2 g/dL (ref 0.7–1.3)
GAMMA GLOB SERPL ELPH-MCNC: 1.3 g/dL (ref 0.4–1.8)
GLOBULIN, TOTAL: 3.8 g/dL (ref 2.2–3.9)
IgA: 166 mg/dL (ref 64–422)
IgG (Immunoglobin G), Serum: 1494 mg/dL (ref 700–1600)
IgM (Immunoglobulin M), Srm: 73 mg/dL (ref 26–217)
M Protein SerPl Elph-Mcnc: 0.6 g/dL — ABNORMAL HIGH
TOTAL PROTEIN ELP: 7.2 g/dL (ref 6.0–8.5)

## 2017-08-15 ENCOUNTER — Inpatient Hospital Stay (HOSPITAL_BASED_OUTPATIENT_CLINIC_OR_DEPARTMENT_OTHER): Payer: Medicare HMO | Admitting: Hematology and Oncology

## 2017-08-15 ENCOUNTER — Encounter: Payer: Self-pay | Admitting: Hematology and Oncology

## 2017-08-15 VITALS — BP 141/95 | HR 97 | Temp 99.0°F | Resp 19 | Ht 64.0 in | Wt 250.8 lb

## 2017-08-15 DIAGNOSIS — E119 Type 2 diabetes mellitus without complications: Secondary | ICD-10-CM | POA: Diagnosis not present

## 2017-08-15 DIAGNOSIS — I10 Essential (primary) hypertension: Secondary | ICD-10-CM

## 2017-08-15 DIAGNOSIS — D472 Monoclonal gammopathy: Secondary | ICD-10-CM

## 2017-08-15 DIAGNOSIS — J449 Chronic obstructive pulmonary disease, unspecified: Secondary | ICD-10-CM

## 2017-08-15 DIAGNOSIS — C9000 Multiple myeloma not having achieved remission: Secondary | ICD-10-CM | POA: Diagnosis not present

## 2017-09-03 NOTE — Progress Notes (Signed)
[facility] Follow-up Visit:  Assessment: Smoldering multiple myeloma ([name] [name]) 77 y.o. female withdiagnosis of multiple myeloma based on the appearance of monoclonal gammopathy with IgG lambda, followed by detection of plasmacytosis in the bone marrow with predominance of lambda light chain restricted plasma cells. Patient has no hypercalcemia, no cytopenias significant enough to initiate the therapy.  Creatinine is elevated, but appears to be stable since May 2018.  It is also likely that kidney injury is attributable to hyperuricemia, hypertension, and diabetes.  Direct nephritis due to lupus can also be included in the differential here.  The plasma cell level in the bone marrow is reasonably low.  Skeletal imaging so far is rather unremarkable with only small indeterminate lucencies in the calvarium to suspect presence of lytic skeletal disease with no associated hypermetabolism on PET/CT.  My overall impression is that of smoldering multiple myeloma that warrants observation instead of treatment initiation at this point in time.  No significant changes in the lab work since last visit to the clinic.  No new significant symptoms to suggest progressive disease at this time.   Plan: -Continue observation -Return to clinic in 3 months: Lab work 1 week prior, clinic visit for hematological monitoring.  Voice recognition software was used and creation of this note. Despite my best effort at editing the text, some misspelling/errors may have occurred.  Orders Placed This Encounter  Procedures  . CBC with Differential (Cancer Center Only)    Standing Status:   Future    Standing Expiration Date:   08/16/2018  . CMP ([facility] only)    Standing Status:   Future    Standing Expiration Date:   08/16/2018  . Lactate dehydrogenase (LDH)    Standing Status:   Future    Standing Expiration Date:   08/15/2018  . Beta 2 microglobulin    Standing Status:   Future    Standing  Expiration Date:   08/15/2018  . Multiple Myeloma Panel (SPEP&IFE w/QIG)    Standing Status:   Future    Standing Expiration Date:   08/15/2018  . Kappa/lambda light chains    Standing Status:   Future    Standing Expiration Date:   08/15/2018    Cancer Staging Smoldering multiple myeloma ([facility]) Staging form: Plasma Cell Myeloma and Plasma Cell Disorders, AJCC 8th Edition - Clinical stage from 05/04/2017: RISS Stage II (Beta-2-microglobulin (mg/L): 4.5, Albumin (g/dL): 3.3, ISS: Stage II, High-risk cytogenetics: Absent, LDH: Elevated) - Signed by Ardath Sax, MD on 05/18/2017   All questions were answered. . The patient knows to call the clinic with any problems, questions or concerns.  This note was electronically signed.    History of Presenting Illness [name] [name] is a 77 y.o. female followed in the [place] for monoclonal gammopathy of unknown significance (MGUS), referred by Dr Philis Fendt. Patient has extensive best medical history including systemic lupus with Sjogren's syndrome, cutaneous manifestations, and positive antibodies, diabetes mellitus, hypertension, chronic kidney disease stage III, aerobatic neuropathy, history of pulmonary embolism, congestive heart failure, as well as a right-sided breast cancer treated with lumpectomy and 2006.   It appears that patient was discovered to have a monoclonal gammopathy sometime early in 2018 or may be filled thousand 17 with recommendation by the rheumatology to be referred for hematology assessment.Please see the summary below for outline or available lab work. At the present time, patient complains mainly of pain in the years bilaterally especially on the left side  with intermittent bleeding and possible purulent drainage. Additionally, patient complains of gout episodes and peripheral neuropathy with tingling and burning in the extremities especially in her feet bilaterally. She denies unexpected weight loss. Denies any new  pain in the bone structures, no new neurological, gastrointestinal, or genitourinary complaints.  Patient returns to my clinic to review the results of the interim assessment.  No new complaints other than pain in the left shoulder for the past 2 weeks.  No limitation of range of motion or strength of the extremity..  Oncological/hematological History: **Multiple Myeloma, Smoldering: --Labs, 02/15/16: SPEP -- M-Spike 0.6g/dL --Feb 2018: tProt 7.3, Alb 3.6, Ca 10.3, Cr 1.6, AP 112; WBC 8.0, Hgb 11.2, Plt 284 --May 2018: tProt 7.8, Alb 3.7, Ca   9.7, Cr 1.9, AP 128; WBC 7.5, Hgb 10.3, Plt 369; --Labs, 03/15/17: tProt 7.8, Alb 3.6, Ca 9.8, Cr 2.0, AP 95, LDH 371; SPEP -- M-spike 0.7g/dL, SIFE -- positive for monoclonal IgG lambda; IgG 1520, IgA 171, IgM 73; kappa 27.2, lambda 17.0, KLR 1.6; WBC 14.0, Hgb 10.9, Plt 316;  --Skeletal Survey, 03/23/17: Multiple small calvarial lucencies, potentially representing lytic lesions of myeloma --BM Bx, 04/04/17: Hypercellular bone marrow with 12% of cellularity being represented by plasma cells. No large aggregates or sheets. Immunohistochemical stains show a polyclonal staining pattern for kappa and lambda light chains, although there is lambda light chain excess/restriction.  Overall, findings interpreted as positive for presence of plasma cell monoclonal disorder. CytoGen -- Normal 46,XX; FISH --negative for any disease-specific mutations including negative for p53 loss or loss of 13 q. --PET-CT, 05/18/17: No evidence of hypermetabolic skeletal lesions. --Labs, 08/08/17: tProt 7.7, Alb 3.7, Ca 10.5, Cr 2.0, AP 84, LDH 339, beta-2 microglobulin 4.3; SPEP -- M-Spike 0.6g/dL, SIFE -- IgG lambda monoclonal protein; IgG 1494, IgA 166, IgM 73; kappa 33.5, lambda 25.6, KLR 1.31; WBC 7.8, Hgb 10.3, Plt 292;   Medical History: Past Medical History:  Diagnosis Date  . Allergic rhinitis   . Anxiety   . Arthritis   . Asthma   . Cancer [facility]) 2006   breast cancer  right  . CHF (congestive heart failure) ([place])   . COPD (chronic obstructive pulmonary disease) ([place])   . Degenerative disc disease, lumbar   . Diabetes mellitus without complication ([place])   . Dyspnea    walking distances  . Eczema   . GERD (gastroesophageal reflux disease)   . Glaucoma   . History of home oxygen therapy    uses 2 liters ay night and prn  . Hyperlipidemia   . Hypertension   . Low back pain   . Lupus ([place])    skin  . Neck pain   . Numbness and tingling   . Obesity   . Osteopenia   . Pulmonary embolism ([place]) 01/2012    CT showed multi small PE and coumadized   . Sleep apnea 2010   no cpap used  . Systemic lupus erythematosus ([place])     Surgical History: Past Surgical History:  Procedure Laterality Date  . ABDOMINAL HYSTERECTOMY  1976  . BACK SURGERY  2006   lower  . BREAST BIOPSY Right   . BREAST EXCISIONAL BIOPSY Left   . BREAST LUMPECTOMY Right   . COLONOSCOPY WITH PROPOFOL N/A 03/23/2015   Procedure: COLONOSCOPY WITH PROPOFOL;  Surgeon: Laurence Spates, MD;  Location: [facility];  Service: Endoscopy;  Laterality: N/A;  . Dobtamine myoview  05/02/2008   EF 67% ; LV norm  . DOPPLER ECHOCARDIOGRAPHY  01/25/2012   EF 55 TO 60%; LV norm.  . EXPLORATORY LAPAROTOMY    . EYE SURGERY Bilateral 2010   lens reaplcments for cataracts   . Lower Extrem. venous doppler  01/25/2012    neg.  . TOE SURGERY  1996   Bunion    Family History: Family History  Problem Relation Age of Onset  . Heart failure Father   . Heart failure Mother   . Diabetes Brother   . Breast cancer Maternal Aunt   . Breast cancer Paternal Aunt     Social History: Social History   Socioeconomic History  . Marital status: Single    Spouse name: Not on file  . Number of children: 5  . Years of education: 80  . Highest education level: Not on file  Occupational History  . Occupation: Disabled  Social Needs  . Financial resource strain: Not on file  . Food insecurity:     Worry: Not on file    Inability: Not on file  . Transportation needs:    Medical: Not on file    Non-medical: Not on file  Tobacco Use  . Smoking status: Former Research scientist (life sciences)  . Smokeless tobacco: Never Used  . Tobacco comment: Quit 1980  Substance and Sexual Activity  . Alcohol use: No    Alcohol/week: 0.0 oz  . Drug use: No  . Sexual activity: Not on file  Lifestyle  . Physical activity:    Days per week: Not on file    Minutes per session: Not on file  . Stress: Not on file  Relationships  . Social connections:    Talks on phone: Not on file    Gets together: Not on file    Attends religious service: Not on file    Active member of club or organization: Not on file    Attends meetings of clubs or organizations: Not on file    Relationship status: Not on file  . Intimate partner violence:    Fear of current or ex partner: Not on file    Emotionally abused: Not on file    Physically abused: Not on file    Forced sexual activity: Not on file  Other Topics Concern  . Not on file  Social History Narrative   Lives at home alone.   Right-handed.   2-4 cups caffeine daily.    Allergies: Allergies  Allergen Reactions  . Penicillins Swelling    Has patient had a PCN reaction causing immediate rash, facial/tongue/throat swelling, SOB or lightheadedness with hypotension: Yes Has patient had a PCN reaction causing severe rash involving mucus membranes or skin necrosis: Yes Has patient had a PCN reaction that required hospitalization Yes Has patient had a PCN reaction occurring within the last 10 years: No, more than 10 yrs ago If all of the above answers are "NO", then may proceed with Cephalosporin use.   . Fentanyl Itching  . Peach [Prunus Persica] Hives  . Shellfish Allergy Hives    Medications:  Current Outpatient Medications  Medication Sig Dispense Refill  . acetaminophen (TYLENOL) 650 MG CR tablet Take 1,300 mg by mouth every 8 (eight) hours as needed for pain.    Marland Kitchen  albuterol (PROVENTIL HFA;VENTOLIN HFA) 108 (90 BASE) MCG/ACT inhaler Inhale 2 puffs into the lungs See admin instructions. Every 6 hours as needed for SOB but also takes 2 puffs BID scheduled.    Marland Kitchen albuterol (PROVENTIL) (2.5 MG/3ML) 0.083% nebulizer solution Take 2.5 mg by nebulization every 6 (  six) hours as needed for wheezing or shortness of breath.     Marland Kitchen alendronate (FOSAMAX) 70 MG tablet Take 70 mg by mouth every Monday. Take with a full glass of water on an empty stomach.    Marland Kitchen allopurinol (ZYLOPRIM) 100 MG tablet Take 100 mg by mouth daily.    Marland Kitchen aspirin 325 MG tablet Take 325 mg by mouth daily.    Marland Kitchen atenolol (TENORMIN) 100 MG tablet Take 100 mg by mouth daily.     . budesonide (PULMICORT) 0.5 MG/2ML nebulizer solution Take 0.5 mg by nebulization every 6 (six) hours as needed (For wheezing.).     Marland Kitchen calcium carbonate (OS-CAL) 1250 (500 Ca) MG chewable tablet Chew 1 tablet by mouth daily.    . cetirizine (ZYRTEC) 10 MG tablet Take 10 mg by mouth daily.    . cholecalciferol (VITAMIN D) 1000 UNITS tablet Take 1,000 Units by mouth daily.    . citalopram (CELEXA) 10 MG tablet Take 10 mg by mouth daily.     . clobetasol ointment (TEMOVATE) 4.12 % Apply 1 application topically 2 (two) times daily.     Marland Kitchen desonide (DESOWEN) 0.05 % cream Apply 1 application topically 2 (two) times daily.     Marland Kitchen dexlansoprazole (DEXILANT) 60 MG capsule Take 60 mg by mouth daily.     . diclofenac sodium (VOLTAREN) 1 % GEL Apply 2 g topically daily. Reported on 09/15/2015    . diltiazem (CARTIA XT) 300 MG 24 hr capsule Take 300 mg by mouth daily. Reported on 09/15/2015    . diphenhydrAMINE (BENADRYL) 25 MG tablet Take 25 mg by mouth every 6 (six) hours as needed for itching.     . furosemide (LASIX) 40 MG tablet Take 40 mg by mouth 2 (two) times daily.     Marland Kitchen gabapentin (NEURONTIN) 300 MG capsule Take 1 capsule (300 mg total) by mouth 2 (two) times daily. 60 capsule 6  . hydrALAZINE (APRESOLINE) 25 MG tablet Take 25 mg by mouth  3 (three) times daily.    . hydroxychloroquine (PLAQUENIL) 200 MG tablet Take 200 mg by mouth daily.     . insulin aspart (NOVOLOG) 100 UNIT/ML injection Inject 10 Units into the skin 3 (three) times daily before meals.     . insulin glargine (LANTUS) 100 UNIT/ML injection Inject 40-50 Units into the skin 2 (two) times daily. She takes 50 units in the morning and 40 units at bedtime.    . isosorbide mononitrate (IMDUR) 30 MG 24 hr tablet Take 30 mg by mouth every morning.    Marland Kitchen ketotifen (ALAWAY) 0.025 % ophthalmic solution Place 1 drop into both eyes daily.     Marland Kitchen lisinopril-hydrochlorothiazide (PRINZIDE,ZESTORETIC) 20-12.5 MG per tablet Take 1 tablet by mouth daily.    Marland Kitchen LORazepam (ATIVAN) 0.5 MG tablet Take 1 tablet (0.5 mg total) by mouth every 4 (four) hours as needed for anxiety (For anxiety with PET-CT or MRI imaging). 25 tablet 0  . OXYGEN Inhale into the lungs. 2L/min - prn during day and every evening.    . pravastatin (PRAVACHOL) 40 MG tablet Take 40 mg by mouth at bedtime.     . SYMBICORT 160-4.5 MCG/ACT inhaler Inhale 2 puffs into the lungs 2 (two) times daily. Reported on 09/15/2015    . tizanidine (ZANAFLEX) 2 MG capsule Take 2 mg by mouth 2 (two) times daily.    . traMADol (ULTRAM) 50 MG tablet Take 1 tablet (50 mg total) by mouth every 6 (six) hours as needed  for severe pain. 60 tablet 0  . vitamin B-12 (CYANOCOBALAMIN) 1000 MCG tablet Take 1,000 mcg by mouth daily. Reported on 09/15/2015     No current facility-administered medications for this visit.     Review of Systems: Review of Systems  HENT:   Negative for hearing loss, mouth sores and tinnitus.   Eyes: Negative.   Musculoskeletal: Positive for gait problem.  Neurological: Positive for dizziness, gait problem and numbness. Negative for extremity weakness, headaches and seizures.  All other systems reviewed and are negative.    PHYSICAL EXAMINATION Blood pressure (!) 141/95, pulse 97, temperature 99 F (37.2 C),  temperature source Oral, resp. rate 19, height _0  (1.626 m), weight 250 lb 12.8 oz (113.8 kg), SpO2 99 %.  ECOG PERFORMANCE STATUS: 2 - Symptomatic, <50% confined to bed  Physical Exam  Constitutional: She is oriented to person, place, and time and well-developed, well-nourished, and in no distress. No distress.  HENT:  Head: Normocephalic and atraumatic.  Right Ear: No drainage. No mastoid tenderness. Tympanic membrane is not perforated. No middle ear effusion.  Left Ear: No drainage. No mastoid tenderness. Tympanic membrane is perforated.  No middle ear effusion.  Mouth/Throat: Oropharynx is clear and moist. No oropharyngeal exudate.  Eyes: Pupils are equal, round, and reactive to light. Conjunctivae and EOM are normal. No scleral icterus.  Neck: No thyromegaly present.  Cardiovascular: Normal rate, regular rhythm and normal heart sounds.  No murmur heard. Pulmonary/Chest: Effort normal and breath sounds normal. No respiratory distress. She has no wheezes. She has no rales.  Abdominal: Soft. Bowel sounds are normal. She exhibits no distension. There is no tenderness. There is no rebound and no guarding.  Musculoskeletal: She exhibits no edema.  Lymphadenopathy:    She has no cervical adenopathy.  Neurological: She is alert and oriented to person, place, and time. She has normal reflexes. No cranial nerve deficit.  Skin: Skin is warm and dry. No rash noted. She is not diaphoretic. No erythema.     LABORATORY DATA: I have personally reviewed the data as listed: No visits with results within 1 Week(s) from this visit.  Latest known visit with results is:  Appointment on 08/08/2017  Component Date Value Ref Range Status  . WBC Count 08/08/2017 7.8  3.9 - 10.3 K/uL Final  . RBC 08/08/2017 3.71  3.70 - 5.45 MIL/uL Final  . Hemoglobin 08/08/2017 10.3* 11.6 - 15.9 g/dL Final  . HCT 08/08/2017 32.4* 34.8 - 46.6 % Final  . MCV 08/08/2017 87.3  79.5 - 101.0 fL Final  . MCH 08/08/2017  27.8  25.1 - 34.0 pg Final  . MCHC 08/08/2017 31.8  31.5 - 36.0 g/dL Final  . RDW 08/08/2017 14.7* 11.2 - 14.5 % Final  . Platelet Count 08/08/2017 292  145 - 400 K/uL Final  . Neutrophils Relative % 08/08/2017 70  % Final  . Neutro Abs 08/08/2017 5.4  1.5 - 6.5 K/uL Final  . Lymphocytes Relative 08/08/2017 22  % Final  . Lymphs Abs 08/08/2017 1.8  0.9 - 3.3 K/uL Final  . Monocytes Relative 08/08/2017 5  % Final  . Monocytes Absolute 08/08/2017 0.4  0.1 - 0.9 K/uL Final  . Eosinophils Relative 08/08/2017 3  % Final  . Eosinophils Absolute 08/08/2017 0.3  0.0 - 0.5 K/uL Final  . Basophils Relative 08/08/2017 0  % Final  . Basophils Absolute 08/08/2017 0.0  0.0 - 0.1 K/uL Final   Performed at [facility], [place]  [address], [place], [place] [address]  . Sodium 08/08/2017 139  136 - 145 mmol/L Final  . Potassium 08/08/2017 4.7  3.5 - 5.1 mmol/L Final  . Chloride 08/08/2017 105  98 - 109 mmol/L Final  . CO2 08/08/2017 21* 22 - 29 mmol/L Final  . Glucose, Bld 08/08/2017 389* 70 - 140 mg/dL Final  . BUN 08/08/2017 30* 7 - 26 mg/dL Final  . Creatinine 08/08/2017 1.95* 0.60 - 1.10 mg/dL Final  . Calcium 08/08/2017 10.5* 8.4 - 10.4 mg/dL Final  . Total Protein 08/08/2017 7.7  6.4 - 8.3 g/dL Final  . Albumin 08/08/2017 3.7  3.5 - 5.0 g/dL Final  . AST 08/08/2017 18  5 - 34 U/L Final  . ALT 08/08/2017 17  0 - 55 U/L Final  . Alkaline Phosphatase 08/08/2017 84  40 - 150 U/L Final  . Total Bilirubin 08/08/2017 0.3  0.2 - 1.2 mg/dL Final  . GFR, Est Non Af Am 08/08/2017 24* >60 mL/min Final  . GFR, Est AFR Am 08/08/2017 28* >60 mL/min Final   Comment: (NOTE) The eGFR has been calculated using the CKD EPI equation. This calculation has not been validated in all clinical situations. eGFR's persistently <60 mL/min signify possible Chronic Kidney Disease.   Georgiann Hahn gap 08/08/2017 13* 3 - 11 Final   Performed at [facility], [place] [address]  [place], [place] [address]  . LDH 08/08/2017 339* 125 - 245 U/L Final   Performed at [facility], [place] [address], [place], [place] [address]  . Uric Acid, Serum 08/08/2017 9.3* 2.6 - 7.4 mg/dL Final   Performed at [facility], [place] [address], [place], [place] [address]  . Beta-2 Microglobulin 08/08/2017 4.3* 0.6 - 2.4 mg/L Final   Comment: (NOTE) Siemens Immulite 2000 Immunochemiluminometric assay (ICMA) Values obtained with different assay methods or kits cannot be used interchangeably. Results cannot be interpreted as absolute evidence of the presence or absence of malignant disease. Performed At: [facility] [place], [place] [id number] Rush Farmer MD [id number] Performed at [facility], [place] [address] [place],  [address]   . IgG (Immunoglobin G), Serum 08/08/2017 1,494  700 - 1,600 mg/dL Final  . IgA 08/08/2017 166  64 - 422 mg/dL Final  . IgM (Immunoglobulin M), Srm 08/08/2017 73  26 - 217 mg/dL Final  . Total Protein ELP 08/08/2017 7.2  6.0 - 8.5 g/dL Corrected  . Albumin SerPl Elph-Mcnc 08/08/2017 3.4  2.9 - 4.4 g/dL Corrected  . Alpha 1 08/08/2017 0.2  0.0 - 0.4 g/dL Corrected  . Alpha2 Glob SerPl Elph-Mcnc 08/08/2017 1.0  0.4 - 1.0 g/dL Corrected  . B-Globulin SerPl Elph-Mcnc 08/08/2017 1.2  0.7 - 1.3 g/dL Corrected  . Gamma Glob SerPl Elph-Mcnc 08/08/2017 1.3  0.4 - 1.8 g/dL Corrected  . M Protein SerPl Elph-Mcnc 08/08/2017 0.6* Not Observed g/dL Corrected  . Globulin, Total 08/08/2017 3.8  2.2 - 3.9 g/dL Corrected  . Albumin/Glob SerPl 08/08/2017 0.9  0.7 - 1.7 Corrected  . IFE 1 08/08/2017 Comment   Corrected   Comment: (NOTE) Immunofixation shows IgG monoclonal protein with lambda light chain specificity.   . Please Note 08/08/2017 Comment   Corrected   Comment: (NOTE) Protein electrophoresis scan will follow via computer, mail, or courier delivery. Performed  At: [facility] [place], [place] [id number] Rush Farmer MD [id number] Performed at [facility], [place]  [address] [place], [place] [address]   . Kappa free light chain 08/08/2017 33.5* 3.3 - 19.4 mg/L Final  . Lamda free light chains 08/08/2017 25.6  5.7 - 26.3 mg/L Final  . Kappa, lamda light chain ratio 08/08/2017 1.31  0.26 - 1.65 Final   Comment: (NOTE) Performed At: [facility] [place], [place] [id number] Rush Farmer MD [id number] Performed at [facility], [place] [address], [place], [place] [address]        Ardath Sax, MD

## 2017-09-03 NOTE — Assessment & Plan Note (Signed)
77 y.o. female withdiagnosis of multiple myeloma based on the appearance of monoclonal gammopathy with IgG lambda, followed by detection of plasmacytosis in the bone marrow with predominance of lambda light chain restricted plasma cells. Patient has no hypercalcemia, no cytopenias significant enough to initiate the therapy.  Creatinine is elevated, but appears to be stable since May 2018.  It is also likely that kidney injury is attributable to hyperuricemia, hypertension, and diabetes.  Direct nephritis due to lupus can also be included in the differential here.  The plasma cell level in the bone marrow is reasonably low.  Skeletal imaging so far is rather unremarkable with only small indeterminate lucencies in the calvarium to suspect presence of lytic skeletal disease with no associated hypermetabolism on PET/CT.  My overall impression is that of smoldering multiple myeloma that warrants observation instead of treatment initiation at this point in time.  No significant changes in the lab work since last visit to the clinic.  No new significant symptoms to suggest progressive disease at this time.   Plan: -Continue observation -Return to clinic in 3 months: Lab work 1 week prior, clinic visit for hematological monitoring.

## 2017-09-19 DIAGNOSIS — I509 Heart failure, unspecified: Secondary | ICD-10-CM | POA: Diagnosis not present

## 2017-09-19 DIAGNOSIS — J9600 Acute respiratory failure, unspecified whether with hypoxia or hypercapnia: Secondary | ICD-10-CM | POA: Diagnosis not present

## 2017-09-19 DIAGNOSIS — I10 Essential (primary) hypertension: Secondary | ICD-10-CM | POA: Diagnosis not present

## 2017-09-19 DIAGNOSIS — E1140 Type 2 diabetes mellitus with diabetic neuropathy, unspecified: Secondary | ICD-10-CM | POA: Diagnosis not present

## 2017-09-19 DIAGNOSIS — L03115 Cellulitis of right lower limb: Secondary | ICD-10-CM | POA: Diagnosis not present

## 2017-09-19 DIAGNOSIS — J4520 Mild intermittent asthma, uncomplicated: Secondary | ICD-10-CM | POA: Diagnosis not present

## 2017-09-22 ENCOUNTER — Encounter (HOSPITAL_BASED_OUTPATIENT_CLINIC_OR_DEPARTMENT_OTHER): Payer: Medicare Other | Attending: Internal Medicine

## 2017-09-22 ENCOUNTER — Telehealth: Payer: Self-pay | Admitting: Hematology and Oncology

## 2017-09-22 DIAGNOSIS — E1151 Type 2 diabetes mellitus with diabetic peripheral angiopathy without gangrene: Secondary | ICD-10-CM | POA: Diagnosis not present

## 2017-09-22 DIAGNOSIS — I110 Hypertensive heart disease with heart failure: Secondary | ICD-10-CM | POA: Insufficient documentation

## 2017-09-22 DIAGNOSIS — I252 Old myocardial infarction: Secondary | ICD-10-CM | POA: Insufficient documentation

## 2017-09-22 DIAGNOSIS — I890 Lymphedema, not elsewhere classified: Secondary | ICD-10-CM | POA: Diagnosis not present

## 2017-09-22 DIAGNOSIS — Z87891 Personal history of nicotine dependence: Secondary | ICD-10-CM | POA: Diagnosis not present

## 2017-09-22 DIAGNOSIS — G4730 Sleep apnea, unspecified: Secondary | ICD-10-CM | POA: Insufficient documentation

## 2017-09-22 DIAGNOSIS — Z794 Long term (current) use of insulin: Secondary | ICD-10-CM | POA: Diagnosis not present

## 2017-09-22 DIAGNOSIS — L97812 Non-pressure chronic ulcer of other part of right lower leg with fat layer exposed: Secondary | ICD-10-CM | POA: Diagnosis not present

## 2017-09-22 DIAGNOSIS — Z923 Personal history of irradiation: Secondary | ICD-10-CM | POA: Diagnosis not present

## 2017-09-22 DIAGNOSIS — Z853 Personal history of malignant neoplasm of breast: Secondary | ICD-10-CM | POA: Insufficient documentation

## 2017-09-22 DIAGNOSIS — I509 Heart failure, unspecified: Secondary | ICD-10-CM | POA: Diagnosis not present

## 2017-09-22 DIAGNOSIS — M329 Systemic lupus erythematosus, unspecified: Secondary | ICD-10-CM | POA: Diagnosis not present

## 2017-09-22 DIAGNOSIS — I87331 Chronic venous hypertension (idiopathic) with ulcer and inflammation of right lower extremity: Secondary | ICD-10-CM | POA: Diagnosis not present

## 2017-09-22 DIAGNOSIS — J449 Chronic obstructive pulmonary disease, unspecified: Secondary | ICD-10-CM | POA: Insufficient documentation

## 2017-09-22 DIAGNOSIS — S81802A Unspecified open wound, left lower leg, initial encounter: Secondary | ICD-10-CM | POA: Diagnosis not present

## 2017-09-22 DIAGNOSIS — E1140 Type 2 diabetes mellitus with diabetic neuropathy, unspecified: Secondary | ICD-10-CM | POA: Diagnosis not present

## 2017-09-22 DIAGNOSIS — I87311 Chronic venous hypertension (idiopathic) with ulcer of right lower extremity: Secondary | ICD-10-CM | POA: Diagnosis not present

## 2017-09-22 NOTE — Telephone Encounter (Signed)
Called pt re appts being moved to [place] - spoke w/ pt re appts.

## 2017-09-29 ENCOUNTER — Encounter: Payer: Self-pay | Admitting: Cardiovascular Disease

## 2017-09-29 ENCOUNTER — Ambulatory Visit (INDEPENDENT_AMBULATORY_CARE_PROVIDER_SITE_OTHER): Payer: Medicare Other | Admitting: Cardiovascular Disease

## 2017-09-29 VITALS — BP 100/61 | HR 82 | Ht 64.0 in | Wt 242.8 lb

## 2017-09-29 DIAGNOSIS — J449 Chronic obstructive pulmonary disease, unspecified: Secondary | ICD-10-CM | POA: Diagnosis not present

## 2017-09-29 DIAGNOSIS — I5032 Chronic diastolic (congestive) heart failure: Secondary | ICD-10-CM

## 2017-09-29 DIAGNOSIS — I890 Lymphedema, not elsewhere classified: Secondary | ICD-10-CM | POA: Diagnosis not present

## 2017-09-29 DIAGNOSIS — G4730 Sleep apnea, unspecified: Secondary | ICD-10-CM | POA: Diagnosis not present

## 2017-09-29 DIAGNOSIS — I10 Essential (primary) hypertension: Secondary | ICD-10-CM | POA: Diagnosis not present

## 2017-09-29 DIAGNOSIS — I87331 Chronic venous hypertension (idiopathic) with ulcer and inflammation of right lower extremity: Secondary | ICD-10-CM | POA: Diagnosis not present

## 2017-09-29 DIAGNOSIS — E7800 Pure hypercholesterolemia, unspecified: Secondary | ICD-10-CM

## 2017-09-29 DIAGNOSIS — I87311 Chronic venous hypertension (idiopathic) with ulcer of right lower extremity: Secondary | ICD-10-CM | POA: Diagnosis not present

## 2017-09-29 DIAGNOSIS — L97812 Non-pressure chronic ulcer of other part of right lower leg with fat layer exposed: Secondary | ICD-10-CM | POA: Diagnosis not present

## 2017-09-29 DIAGNOSIS — I110 Hypertensive heart disease with heart failure: Secondary | ICD-10-CM | POA: Diagnosis not present

## 2017-09-29 NOTE — Patient Instructions (Signed)

## 2017-09-29 NOTE — Assessment & Plan Note (Signed)
History of multiple small pulmonary emboli in the past on Coumadin anticoagulation for a short period time which was subsequently discontinued.  She did have negative venous Doppler studies.

## 2017-09-29 NOTE — Assessment & Plan Note (Signed)
History of essential hypertension blood pressure measured today at 100/61.  She is on atenolol and hydralazine as well as lisinopril hydrochlorothiazide.  Continue current meds at current dosing.

## 2017-09-29 NOTE — Assessment & Plan Note (Signed)
History of hyperlipidemia on statin therapy. 

## 2017-09-29 NOTE — Progress Notes (Signed)
09/29/2017 [name] [name]   [birth year]  [id number]  Primary Physician Nolene Ebbs, MD Primary Cardiologist: Lorretta Harp MD FACP, [place], [place], [place]  HPI:  [name] [name] is a 77 y.o.  severely overweight widowed African American female, mother of 1 and grandmother to 2 grandchildren, whom I last saw  09/28/2016.Marland Kitchen She has a history of congestive heart failure probably related to diastolic dysfunction with normal LV function. She had moderate concentric LVH and grade 1 diastolic dysfunction by echo January 25, 2012. Her other problems include hypertension, non-insulin-dependent diabetes, hyperlipidemia, and obstructive sleep apnea. She had a negative Myoview May 02, 2008. She was admitted October 8-13 of this year with shortness of breath. The CT angiogram showed multiple small pulmonary emboli and she was coumadinized. Her lower extremity venous Dopplers were negative. Her primary care physician has since stopped her Coumadin anticoagulation. Since I saw her a year ago she's been asymptomatic other than chronic shortness of breath.   She is had recurrent of her venous ulcers and lower extremity edema and is seen Dr. Dellia Nims at the wound care center.  Her legs are both wrapped.  She was on twice daily furosemide which resulted in worsening of renal function so currently she is on daily.  Her serum creatinine runs in the 2 range.   Current Meds  Medication Sig  . acetaminophen (TYLENOL) 650 MG CR tablet Take 1,300 mg by mouth every 8 (eight) hours as needed for pain.  Marland Kitchen albuterol (PROVENTIL HFA;VENTOLIN HFA) 108 (90 BASE) MCG/ACT inhaler Inhale 2 puffs into the lungs See admin instructions. Every 6 hours as needed for SOB but also takes 2 puffs BID scheduled.  Marland Kitchen albuterol (PROVENTIL) (2.5 MG/3ML) 0.083% nebulizer solution Take 2.5 mg by nebulization every 6 (six) hours as needed for wheezing or shortness of breath.   Marland Kitchen alendronate (FOSAMAX) 70 MG tablet Take 70 mg by mouth every Monday. Take  with a full glass of water on an empty stomach.  Marland Kitchen allopurinol (ZYLOPRIM) 100 MG tablet Take 100 mg by mouth daily.  Marland Kitchen aspirin 325 MG tablet Take 325 mg by mouth daily.  Marland Kitchen atenolol (TENORMIN) 100 MG tablet Take 100 mg by mouth daily.   . budesonide (PULMICORT) 0.5 MG/2ML nebulizer solution Take 0.5 mg by nebulization every 6 (six) hours as needed (For wheezing.).   Marland Kitchen calcium carbonate (OS-CAL) 1250 (500 Ca) MG chewable tablet Chew 1 tablet by mouth daily.  . cetirizine (ZYRTEC) 10 MG tablet Take 10 mg by mouth daily.  . cholecalciferol (VITAMIN D) 1000 UNITS tablet Take 1,000 Units by mouth daily.  . citalopram (CELEXA) 10 MG tablet Take 10 mg by mouth daily.   . clobetasol ointment (TEMOVATE) 9.62 % Apply 1 application topically 2 (two) times daily.   Marland Kitchen desonide (DESOWEN) 0.05 % cream Apply 1 application topically 2 (two) times daily.   Marland Kitchen dexlansoprazole (DEXILANT) 60 MG capsule Take 60 mg by mouth daily.   . diclofenac sodium (VOLTAREN) 1 % GEL Apply 2 g topically daily. Reported on 09/15/2015  . diltiazem (CARTIA XT) 300 MG 24 hr capsule Take 300 mg by mouth daily. Reported on 09/15/2015  . diphenhydrAMINE (BENADRYL) 25 MG tablet Take 25 mg by mouth every 6 (six) hours as needed for itching.   . furosemide (LASIX) 40 MG tablet Take 40 mg by mouth 2 (two) times daily.   Marland Kitchen gabapentin (NEURONTIN) 300 MG capsule Take 1 capsule (300 mg total) by mouth 2 (two) times daily.  Marland Kitchen  hydrALAZINE (APRESOLINE) 25 MG tablet Take 25 mg by mouth 3 (three) times daily.  . hydroxychloroquine (PLAQUENIL) 200 MG tablet Take 200 mg by mouth daily.   . insulin aspart (NOVOLOG) 100 UNIT/ML injection Inject 10 Units into the skin 3 (three) times daily before meals.   . insulin glargine (LANTUS) 100 UNIT/ML injection Inject 40-50 Units into the skin 2 (two) times daily. She takes 50 units in the morning and 40 units at bedtime.  . isosorbide mononitrate (IMDUR) 30 MG 24 hr tablet Take 30 mg by mouth every morning.  Marland Kitchen  ketotifen (ALAWAY) 0.025 % ophthalmic solution Place 1 drop into both eyes daily.   Marland Kitchen lisinopril-hydrochlorothiazide (PRINZIDE,ZESTORETIC) 20-12.5 MG per tablet Take 1 tablet by mouth daily.  Marland Kitchen LORazepam (ATIVAN) 0.5 MG tablet Take 1 tablet (0.5 mg total) by mouth every 4 (four) hours as needed for anxiety (For anxiety with PET-CT or MRI imaging).  . OXYGEN Inhale into the lungs. 2L/min - prn during day and every evening.  . pravastatin (PRAVACHOL) 40 MG tablet Take 40 mg by mouth at bedtime.   . SYMBICORT 160-4.5 MCG/ACT inhaler Inhale 2 puffs into the lungs 2 (two) times daily. Reported on 09/15/2015  . tizanidine (ZANAFLEX) 2 MG capsule Take 2 mg by mouth 2 (two) times daily.  . traMADol (ULTRAM) 50 MG tablet Take 1 tablet (50 mg total) by mouth every 6 (six) hours as needed for severe pain.  . vitamin B-12 (CYANOCOBALAMIN) 1000 MCG tablet Take 1,000 mcg by mouth daily. Reported on 09/15/2015     Allergies  Allergen Reactions  . Penicillins Swelling    Has patient had a PCN reaction causing immediate rash, facial/tongue/throat swelling, SOB or lightheadedness with hypotension: Yes Has patient had a PCN reaction causing severe rash involving mucus membranes or skin necrosis: Yes Has patient had a PCN reaction that required hospitalization Yes Has patient had a PCN reaction occurring within the last 10 years: No, more than 10 yrs ago If all of the above answers are "NO", then may proceed with Cephalosporin use.   . Fentanyl Itching  . Peach [Prunus Persica] Hives  . Shellfish Allergy Hives    Social History   Socioeconomic History  . Marital status: Single    Spouse name: Not on file  . Number of children: 5  . Years of education: 21  . Highest education level: Not on file  Occupational History  . Occupation: Disabled  Social Needs  . Financial resource strain: Not on file  . Food insecurity:    Worry: Not on file    Inability: Not on file  . Transportation needs:    Medical:  Not on file    Non-medical: Not on file  Tobacco Use  . Smoking status: Former Research scientist (life sciences)  . Smokeless tobacco: Never Used  . Tobacco comment: Quit 1980  Substance and Sexual Activity  . Alcohol use: No    Alcohol/week: 0.0 oz  . Drug use: No  . Sexual activity: Not on file  Lifestyle  . Physical activity:    Days per week: Not on file    Minutes per session: Not on file  . Stress: Not on file  Relationships  . Social connections:    Talks on phone: Not on file    Gets together: Not on file    Attends religious service: Not on file    Active member of club or organization: Not on file    Attends meetings of clubs or organizations: Not on  file    Relationship status: Not on file  . Intimate partner violence:    Fear of current or ex partner: Not on file    Emotionally abused: Not on file    Physically abused: Not on file    Forced sexual activity: Not on file  Other Topics Concern  . Not on file  Social History Narrative   Lives at home alone.   Right-handed.   2-4 cups caffeine daily.     Review of Systems: General: negative for chills, fever, night sweats or weight changes.  Cardiovascular: negative for chest pain, dyspnea on exertion, edema, orthopnea, palpitations, paroxysmal nocturnal dyspnea or shortness of breath Dermatological: negative for rash Respiratory: negative for cough or wheezing Urologic: negative for hematuria Abdominal: negative for nausea, vomiting, diarrhea, bright red blood per rectum, melena, or hematemesis Neurologic: negative for visual changes, syncope, or dizziness All other systems reviewed and are otherwise negative except as noted above.    Blood pressure 100/61, pulse 82, height 5\' 4"  (1.626 m), weight 242 lb 12.8 oz (110.1 kg).  General appearance: alert and no distress Neck: no adenopathy, no carotid bruit, no JVD, supple, symmetrical, trachea midline and thyroid not enlarged, symmetric, no tenderness/mass/nodules Lungs: clear to  auscultation bilaterally Heart: regular rate and rhythm, S1, S2 normal, no murmur, click, rub or gallop Extremities: Both of her lower extremities are wrapped Pulses: 2+ and symmetric Skin: Both of her lower extremities are wrapped and therefore I was unable to observe Neurologic: Alert and oriented X 3, normal strength and tone. Normal symmetric reflexes. Normal coordination and gait  EKG sinus rhythm at 82 with right bundle branch block.  I personally reviewed this EKG.  ASSESSMENT AND PLAN:   Pulmonary embolism History of multiple small pulmonary emboli in the past on Coumadin anticoagulation for a short period time which was subsequently discontinued.  She did have negative venous Doppler studies.  Chronic diastolic heart failure Chronic diastolic heart failure on diuretic therapy.  Hypertension History of essential hypertension blood pressure measured today at 100/61.  She is on atenolol and hydralazine as well as lisinopril hydrochlorothiazide.  Continue current meds at current dosing.  Hyperlipidemia History of hyperlipidemia on statin therapy      Lorretta Harp MD [place], [facility] 09/29/2017 9:54 AM

## 2017-09-29 NOTE — Assessment & Plan Note (Signed)
Chronic diastolic heart failure on diuretic therapy. 

## 2017-10-06 DIAGNOSIS — I87331 Chronic venous hypertension (idiopathic) with ulcer and inflammation of right lower extremity: Secondary | ICD-10-CM | POA: Diagnosis not present

## 2017-10-06 DIAGNOSIS — I890 Lymphedema, not elsewhere classified: Secondary | ICD-10-CM | POA: Diagnosis not present

## 2017-10-06 DIAGNOSIS — L97811 Non-pressure chronic ulcer of other part of right lower leg limited to breakdown of skin: Secondary | ICD-10-CM | POA: Diagnosis not present

## 2017-10-06 DIAGNOSIS — J449 Chronic obstructive pulmonary disease, unspecified: Secondary | ICD-10-CM | POA: Diagnosis not present

## 2017-10-06 DIAGNOSIS — G4730 Sleep apnea, unspecified: Secondary | ICD-10-CM | POA: Diagnosis not present

## 2017-10-06 DIAGNOSIS — I110 Hypertensive heart disease with heart failure: Secondary | ICD-10-CM | POA: Diagnosis not present

## 2017-10-06 DIAGNOSIS — I87312 Chronic venous hypertension (idiopathic) with ulcer of left lower extremity: Secondary | ICD-10-CM | POA: Diagnosis not present

## 2017-10-06 DIAGNOSIS — L97812 Non-pressure chronic ulcer of other part of right lower leg with fat layer exposed: Secondary | ICD-10-CM | POA: Diagnosis not present

## 2017-10-12 DIAGNOSIS — I87331 Chronic venous hypertension (idiopathic) with ulcer and inflammation of right lower extremity: Secondary | ICD-10-CM | POA: Diagnosis not present

## 2017-10-12 DIAGNOSIS — I872 Venous insufficiency (chronic) (peripheral): Secondary | ICD-10-CM | POA: Diagnosis not present

## 2017-10-12 DIAGNOSIS — S81801A Unspecified open wound, right lower leg, initial encounter: Secondary | ICD-10-CM | POA: Diagnosis not present

## 2017-10-12 DIAGNOSIS — I890 Lymphedema, not elsewhere classified: Secondary | ICD-10-CM | POA: Diagnosis not present

## 2017-10-12 DIAGNOSIS — I110 Hypertensive heart disease with heart failure: Secondary | ICD-10-CM | POA: Diagnosis not present

## 2017-10-12 DIAGNOSIS — G4730 Sleep apnea, unspecified: Secondary | ICD-10-CM | POA: Diagnosis not present

## 2017-10-12 DIAGNOSIS — L97812 Non-pressure chronic ulcer of other part of right lower leg with fat layer exposed: Secondary | ICD-10-CM | POA: Diagnosis not present

## 2017-10-12 DIAGNOSIS — J449 Chronic obstructive pulmonary disease, unspecified: Secondary | ICD-10-CM | POA: Diagnosis not present

## 2017-10-17 DIAGNOSIS — I10 Essential (primary) hypertension: Secondary | ICD-10-CM | POA: Diagnosis not present

## 2017-10-17 DIAGNOSIS — E1140 Type 2 diabetes mellitus with diabetic neuropathy, unspecified: Secondary | ICD-10-CM | POA: Diagnosis not present

## 2017-10-17 DIAGNOSIS — N183 Chronic kidney disease, stage 3 (moderate): Secondary | ICD-10-CM | POA: Diagnosis not present

## 2017-10-17 DIAGNOSIS — I509 Heart failure, unspecified: Secondary | ICD-10-CM | POA: Diagnosis not present

## 2017-10-17 DIAGNOSIS — G5610 Other lesions of median nerve, unspecified upper limb: Secondary | ICD-10-CM | POA: Diagnosis not present

## 2017-10-17 DIAGNOSIS — J4520 Mild intermittent asthma, uncomplicated: Secondary | ICD-10-CM | POA: Diagnosis not present

## 2017-11-02 ENCOUNTER — Encounter (HOSPITAL_BASED_OUTPATIENT_CLINIC_OR_DEPARTMENT_OTHER): Payer: Medicare HMO | Attending: Internal Medicine

## 2017-11-02 DIAGNOSIS — I87312 Chronic venous hypertension (idiopathic) with ulcer of left lower extremity: Secondary | ICD-10-CM | POA: Diagnosis not present

## 2017-11-02 DIAGNOSIS — G4730 Sleep apnea, unspecified: Secondary | ICD-10-CM | POA: Diagnosis not present

## 2017-11-02 DIAGNOSIS — E1151 Type 2 diabetes mellitus with diabetic peripheral angiopathy without gangrene: Secondary | ICD-10-CM | POA: Diagnosis not present

## 2017-11-02 DIAGNOSIS — I87332 Chronic venous hypertension (idiopathic) with ulcer and inflammation of left lower extremity: Secondary | ICD-10-CM | POA: Diagnosis not present

## 2017-11-02 DIAGNOSIS — I890 Lymphedema, not elsewhere classified: Secondary | ICD-10-CM | POA: Diagnosis not present

## 2017-11-02 DIAGNOSIS — I509 Heart failure, unspecified: Secondary | ICD-10-CM | POA: Insufficient documentation

## 2017-11-02 DIAGNOSIS — E1140 Type 2 diabetes mellitus with diabetic neuropathy, unspecified: Secondary | ICD-10-CM | POA: Insufficient documentation

## 2017-11-02 DIAGNOSIS — M109 Gout, unspecified: Secondary | ICD-10-CM | POA: Diagnosis not present

## 2017-11-02 DIAGNOSIS — I110 Hypertensive heart disease with heart failure: Secondary | ICD-10-CM | POA: Insufficient documentation

## 2017-11-02 DIAGNOSIS — I252 Old myocardial infarction: Secondary | ICD-10-CM | POA: Diagnosis not present

## 2017-11-02 DIAGNOSIS — L97821 Non-pressure chronic ulcer of other part of left lower leg limited to breakdown of skin: Secondary | ICD-10-CM | POA: Diagnosis not present

## 2017-11-02 DIAGNOSIS — L97822 Non-pressure chronic ulcer of other part of left lower leg with fat layer exposed: Secondary | ICD-10-CM | POA: Diagnosis not present

## 2017-11-02 DIAGNOSIS — J449 Chronic obstructive pulmonary disease, unspecified: Secondary | ICD-10-CM | POA: Insufficient documentation

## 2017-11-02 DIAGNOSIS — L930 Discoid lupus erythematosus: Secondary | ICD-10-CM | POA: Diagnosis not present

## 2017-11-02 DIAGNOSIS — Z923 Personal history of irradiation: Secondary | ICD-10-CM | POA: Insufficient documentation

## 2017-11-03 DIAGNOSIS — J449 Chronic obstructive pulmonary disease, unspecified: Secondary | ICD-10-CM | POA: Diagnosis not present

## 2017-11-07 ENCOUNTER — Inpatient Hospital Stay: Payer: Medicare HMO | Attending: Hematology and Oncology

## 2017-11-07 DIAGNOSIS — D631 Anemia in chronic kidney disease: Secondary | ICD-10-CM | POA: Insufficient documentation

## 2017-11-07 DIAGNOSIS — Z87891 Personal history of nicotine dependence: Secondary | ICD-10-CM | POA: Insufficient documentation

## 2017-11-07 DIAGNOSIS — N189 Chronic kidney disease, unspecified: Secondary | ICD-10-CM | POA: Diagnosis not present

## 2017-11-07 DIAGNOSIS — F419 Anxiety disorder, unspecified: Secondary | ICD-10-CM | POA: Diagnosis not present

## 2017-11-07 DIAGNOSIS — D472 Monoclonal gammopathy: Secondary | ICD-10-CM

## 2017-11-07 DIAGNOSIS — Z86711 Personal history of pulmonary embolism: Secondary | ICD-10-CM | POA: Diagnosis not present

## 2017-11-07 DIAGNOSIS — Z79899 Other long term (current) drug therapy: Secondary | ICD-10-CM | POA: Insufficient documentation

## 2017-11-07 DIAGNOSIS — I5032 Chronic diastolic (congestive) heart failure: Secondary | ICD-10-CM | POA: Insufficient documentation

## 2017-11-07 DIAGNOSIS — E669 Obesity, unspecified: Secondary | ICD-10-CM | POA: Insufficient documentation

## 2017-11-07 DIAGNOSIS — J449 Chronic obstructive pulmonary disease, unspecified: Secondary | ICD-10-CM | POA: Diagnosis not present

## 2017-11-07 DIAGNOSIS — M109 Gout, unspecified: Secondary | ICD-10-CM | POA: Diagnosis not present

## 2017-11-07 DIAGNOSIS — C9000 Multiple myeloma not having achieved remission: Secondary | ICD-10-CM | POA: Insufficient documentation

## 2017-11-07 DIAGNOSIS — E119 Type 2 diabetes mellitus without complications: Secondary | ICD-10-CM | POA: Insufficient documentation

## 2017-11-07 LAB — CBC WITH DIFFERENTIAL (CANCER CENTER ONLY)
BASOS ABS: 0.1 10*3/uL (ref 0.0–0.1)
BASOS PCT: 1 %
EOS ABS: 0.2 10*3/uL (ref 0.0–0.5)
EOS PCT: 2 %
HCT: 30.2 % — ABNORMAL LOW (ref 34.8–46.6)
Hemoglobin: 9.6 g/dL — ABNORMAL LOW (ref 11.6–15.9)
Lymphocytes Relative: 14 %
Lymphs Abs: 1.1 10*3/uL (ref 0.9–3.3)
MCH: 27.4 pg (ref 25.1–34.0)
MCHC: 31.9 g/dL (ref 31.5–36.0)
MCV: 85.8 fL (ref 79.5–101.0)
MONO ABS: 0.3 10*3/uL (ref 0.1–0.9)
Monocytes Relative: 4 %
NEUTROS ABS: 6 10*3/uL (ref 1.5–6.5)
Neutrophils Relative %: 79 %
Platelet Count: 341 10*3/uL (ref 145–400)
RBC: 3.52 MIL/uL — ABNORMAL LOW (ref 3.70–5.45)
RDW: 17 % — AB (ref 11.2–14.5)
WBC Count: 7.7 10*3/uL (ref 3.9–10.3)

## 2017-11-07 LAB — CMP (CANCER CENTER ONLY)
ALBUMIN: 3.6 g/dL (ref 3.5–5.0)
ALK PHOS: 90 U/L (ref 38–126)
ALT: 12 U/L (ref 0–44)
ANION GAP: 9 (ref 5–15)
AST: 14 U/L — ABNORMAL LOW (ref 15–41)
BILIRUBIN TOTAL: 0.3 mg/dL (ref 0.3–1.2)
BUN: 30 mg/dL — AB (ref 8–23)
CALCIUM: 10.2 mg/dL (ref 8.9–10.3)
CO2: 26 mmol/L (ref 22–32)
CREATININE: 1.71 mg/dL — AB (ref 0.44–1.00)
Chloride: 106 mmol/L (ref 98–111)
GFR, Est AFR Am: 32 mL/min — ABNORMAL LOW (ref >60)
GFR, Estimated: 28 mL/min — ABNORMAL LOW (ref >60)
GLUCOSE: 337 mg/dL — AB (ref 70–99)
Potassium: 4 mmol/L (ref 3.5–5.1)
Sodium: 141 mmol/L (ref 135–145)
TOTAL PROTEIN: 7.5 g/dL (ref 6.5–8.1)

## 2017-11-07 LAB — LACTATE DEHYDROGENASE: LDH: 266 U/L — AB (ref 98–192)

## 2017-11-08 LAB — MULTIPLE MYELOMA PANEL, SERUM
ALBUMIN/GLOB SERPL: 0.8 (ref 0.7–1.7)
ALPHA2 GLOB SERPL ELPH-MCNC: 1.1 g/dL — AB (ref 0.4–1.0)
Albumin SerPl Elph-Mcnc: 3.1 g/dL (ref 2.9–4.4)
Alpha 1: 0.2 g/dL (ref 0.0–0.4)
B-Globulin SerPl Elph-Mcnc: 1.2 g/dL (ref 0.7–1.3)
Gamma Glob SerPl Elph-Mcnc: 1.4 g/dL (ref 0.4–1.8)
Globulin, Total: 4 g/dL — ABNORMAL HIGH (ref 2.2–3.9)
IGA: 169 mg/dL (ref 64–422)
IGM (IMMUNOGLOBULIN M), SRM: 70 mg/dL (ref 26–217)
IgG (Immunoglobin G), Serum: 1539 mg/dL (ref 700–1600)
M Protein SerPl Elph-Mcnc: 0.7 g/dL — ABNORMAL HIGH
Total Protein ELP: 7.1 g/dL (ref 6.0–8.5)

## 2017-11-08 LAB — BETA 2 MICROGLOBULIN, SERUM: BETA 2 MICROGLOBULIN: 4.5 mg/L — AB (ref 0.6–2.4)

## 2017-11-08 LAB — KAPPA/LAMBDA LIGHT CHAINS
Kappa free light chain: 39.2 mg/L — ABNORMAL HIGH (ref 3.3–19.4)
Kappa, lambda light chain ratio: 1.5 (ref 0.26–1.65)
Lambda free light chains: 26.2 mg/L (ref 5.7–26.3)

## 2017-11-09 DIAGNOSIS — E1351 Other specified diabetes mellitus with diabetic peripheral angiopathy without gangrene: Secondary | ICD-10-CM | POA: Diagnosis not present

## 2017-11-09 DIAGNOSIS — B351 Tinea unguium: Secondary | ICD-10-CM | POA: Diagnosis not present

## 2017-11-10 DIAGNOSIS — I87312 Chronic venous hypertension (idiopathic) with ulcer of left lower extremity: Secondary | ICD-10-CM | POA: Diagnosis not present

## 2017-11-10 DIAGNOSIS — J449 Chronic obstructive pulmonary disease, unspecified: Secondary | ICD-10-CM | POA: Diagnosis not present

## 2017-11-10 DIAGNOSIS — I890 Lymphedema, not elsewhere classified: Secondary | ICD-10-CM | POA: Diagnosis not present

## 2017-11-10 DIAGNOSIS — G4730 Sleep apnea, unspecified: Secondary | ICD-10-CM | POA: Diagnosis not present

## 2017-11-10 DIAGNOSIS — I87332 Chronic venous hypertension (idiopathic) with ulcer and inflammation of left lower extremity: Secondary | ICD-10-CM | POA: Diagnosis not present

## 2017-11-10 DIAGNOSIS — I252 Old myocardial infarction: Secondary | ICD-10-CM | POA: Diagnosis not present

## 2017-11-10 DIAGNOSIS — I110 Hypertensive heart disease with heart failure: Secondary | ICD-10-CM | POA: Diagnosis not present

## 2017-11-10 DIAGNOSIS — I509 Heart failure, unspecified: Secondary | ICD-10-CM | POA: Diagnosis not present

## 2017-11-10 DIAGNOSIS — E1151 Type 2 diabetes mellitus with diabetic peripheral angiopathy without gangrene: Secondary | ICD-10-CM | POA: Diagnosis not present

## 2017-11-10 DIAGNOSIS — L97822 Non-pressure chronic ulcer of other part of left lower leg with fat layer exposed: Secondary | ICD-10-CM | POA: Diagnosis not present

## 2017-11-10 DIAGNOSIS — L97821 Non-pressure chronic ulcer of other part of left lower leg limited to breakdown of skin: Secondary | ICD-10-CM | POA: Diagnosis not present

## 2017-11-10 NOTE — Progress Notes (Signed)
HEMATOLOGY/ONCOLOGY CONSULTATION NOTE  Date of Service: 11/13/2017  Patient Care Team: Nolene Ebbs, MD as PCP - General (Internal Medicine)  CHIEF COMPLAINTS/PURPOSE OF CONSULTATION:  MGUS   HISTORY OF PRESENTING ILLNESS:  [name] [name] is a wonderful 77 y.o. female who has been previously followed by my colleague [name]. 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 She is accompanied today by her sister-in-law. The pt reports that she is doing well overall.   The pt reports that she has been having some blisters and open wounds related to her leg swelling, and is being followed by a wound clinic for this. She denies any other concerning symptoms or developments since she last saw [name]. Lebron Conners.   She had a PE two years ago and is no longer taking blood thinners. She has lupus and takes Plaquenil, and sees [name]. Sabino Niemann in rheumatology. She notes that she has fairly controlled diabetes and is followed by her PCP [name].Edwin Avbuere regarding this. She also sees [name]. Quay Burow in cardiology. She also sees [name]. Basil Dess in orthopedics. She also sees [facility]. The pt also notes that she has gout attacks in her toes, for which she takes Allopurinol.   Of note since the patient's last visit, pt has had PET/CT completed on 05/18/17 with results revealing No FDG PET-CT evidence of active multiple myeloma.  Lab results (11/07/17) of CBC w/diff, CMP, and Reticulocytes is as follows: all values are WNL except for RBC at 3.52, HGB at 9.6, HCT at 30.2, RDW at 17.0, Glucose at 337, BUN at 30, Creatinine at 1.71, AST at 14, GFR at 32. MMP 11/07/17 revealed all values WNL except for Alpha2 Glob Ser at 1.1, M Protein at 0.7, Globulin total at 4.0.  Kappa/Lambda 11/07/17 revealed all values WNL except for Kappa at 39.2. Beta-2 microglobulin 11/07/17 was at 4.5 LDH 11/07/17 was at 266  On review of systems, pt reports stable energy levels, occasional gout attacks in her toes, leg swelling,  open wounds on left leg, and denies dizziness, light headedness, abdominal pains, new bone pains, and any other symptoms.   MEDICAL HISTORY:  Past Medical History:  Diagnosis Date  . Allergic rhinitis   . Anxiety   . Arthritis   . Asthma   . Cancer [facility]) 2006   breast cancer right  . CHF (congestive heart failure) ([place])   . COPD (chronic obstructive pulmonary disease) ([place])   . Degenerative disc disease, lumbar   . Diabetes mellitus without complication ([place])   . Dyspnea    walking distances  . Eczema   . GERD (gastroesophageal reflux disease)   . Glaucoma   . History of home oxygen therapy    uses 2 liters ay night and prn  . Hyperlipidemia   . Hypertension   . Low back pain   . Lupus ([place])    skin  . Neck pain   . Numbness and tingling   . Obesity   . Osteopenia   . Pulmonary embolism ([place]) 01/2012    CT showed multi small PE and coumadized   . Sleep apnea 2010   no cpap used  . Systemic lupus erythematosus ([place])     SURGICAL HISTORY: Past Surgical History:  Procedure Laterality Date  . ABDOMINAL HYSTERECTOMY  1976  . BACK SURGERY  2006   lower  . BREAST BIOPSY Right   . BREAST EXCISIONAL BIOPSY Left   . BREAST LUMPECTOMY Right   . COLONOSCOPY WITH PROPOFOL N/A  03/23/2015   Procedure: COLONOSCOPY WITH PROPOFOL;  Surgeon: Laurence Spates, MD;  Location: [facility] ENDOSCOPY;  Service: Endoscopy;  Laterality: N/A;  . Dobtamine myoview  05/02/2008   EF 67% ; LV norm  . DOPPLER ECHOCARDIOGRAPHY  01/25/2012   EF 55 TO 60%; LV norm.  . EXPLORATORY LAPAROTOMY    . EYE SURGERY Bilateral 2010   lens reaplcments for cataracts   . Lower Extrem. venous doppler  01/25/2012    neg.  . TOE SURGERY  1996   Bunion    SOCIAL HISTORY: Social History   Socioeconomic History  . Marital status: Single    Spouse name: Not on file  . Number of children: 5  . Years of education: 63  . Highest education level: Not on file  Occupational History  . Occupation: Disabled  Social  Needs  . Financial resource strain: Not on file  . Food insecurity:    Worry: Not on file    Inability: Not on file  . Transportation needs:    Medical: Not on file    Non-medical: Not on file  Tobacco Use  . Smoking status: Former Research scientist (life sciences)  . Smokeless tobacco: Never Used  . Tobacco comment: Quit 1980  Substance and Sexual Activity  . Alcohol use: No    Alcohol/week: 0.0 oz  . Drug use: No  . Sexual activity: Not on file  Lifestyle  . Physical activity:    Days per week: Not on file    Minutes per session: Not on file  . Stress: Not on file  Relationships  . Social connections:    Talks on phone: Not on file    Gets together: Not on file    Attends religious service: Not on file    Active member of club or organization: Not on file    Attends meetings of clubs or organizations: Not on file    Relationship status: Not on file  . Intimate partner violence:    Fear of current or ex partner: Not on file    Emotionally abused: Not on file    Physically abused: Not on file    Forced sexual activity: Not on file  Other Topics Concern  . Not on file  Social History Narrative   Lives at home alone.   Right-handed.   2-4 cups caffeine daily.    FAMILY HISTORY: Family History  Problem Relation Age of Onset  . Heart failure Father   . Heart failure Mother   . Diabetes Brother   . Breast cancer Maternal Aunt   . Breast cancer Paternal Aunt     ALLERGIES:  is allergic to penicillins; fentanyl; peach [prunus persica]; and shellfish allergy.  MEDICATIONS:  Current Outpatient Medications  Medication Sig Dispense Refill  . acetaminophen (TYLENOL) 650 MG CR tablet Take 1,300 mg by mouth every 8 (eight) hours as needed for pain.    Marland Kitchen albuterol (PROVENTIL HFA;VENTOLIN HFA) 108 (90 BASE) MCG/ACT inhaler Inhale 2 puffs into the lungs See admin instructions. Every 6 hours as needed for SOB but also takes 2 puffs BID scheduled.    Marland Kitchen albuterol (PROVENTIL) (2.5 MG/3ML) 0.083%  nebulizer solution Take 2.5 mg by nebulization every 6 (six) hours as needed for wheezing or shortness of breath.     Marland Kitchen alendronate (FOSAMAX) 70 MG tablet Take 70 mg by mouth every Monday. Take with a full glass of water on an empty stomach.    Marland Kitchen allopurinol (ZYLOPRIM) 100 MG tablet Take 100 mg by mouth  daily.    . aspirin 325 MG tablet Take 325 mg by mouth daily.    Marland Kitchen atenolol (TENORMIN) 100 MG tablet Take 100 mg by mouth daily.     . budesonide (PULMICORT) 0.5 MG/2ML nebulizer solution Take 0.5 mg by nebulization every 6 (six) hours as needed (For wheezing.).     Marland Kitchen calcium carbonate (OS-CAL) 1250 (500 Ca) MG chewable tablet Chew 1 tablet by mouth daily.    . cetirizine (ZYRTEC) 10 MG tablet Take 10 mg by mouth daily.    . cholecalciferol (VITAMIN D) 1000 UNITS tablet Take 1,000 Units by mouth daily.    . citalopram (CELEXA) 10 MG tablet Take 10 mg by mouth daily.     . clobetasol ointment (TEMOVATE) 3.81 % Apply 1 application topically 2 (two) times daily.     Marland Kitchen desonide (DESOWEN) 0.05 % cream Apply 1 application topically 2 (two) times daily.     Marland Kitchen dexlansoprazole (DEXILANT) 60 MG capsule Take 60 mg by mouth daily.     . diclofenac sodium (VOLTAREN) 1 % GEL Apply 2 g topically daily. Reported on 09/15/2015    . diltiazem (CARTIA XT) 300 MG 24 hr capsule Take 300 mg by mouth daily. Reported on 09/15/2015    . diphenhydrAMINE (BENADRYL) 25 MG tablet Take 25 mg by mouth every 6 (six) hours as needed for itching.     . furosemide (LASIX) 40 MG tablet Take 40 mg by mouth 2 (two) times daily.     Marland Kitchen gabapentin (NEURONTIN) 300 MG capsule Take 1 capsule (300 mg total) by mouth 2 (two) times daily. 60 capsule 6  . hydrALAZINE (APRESOLINE) 25 MG tablet Take 25 mg by mouth 3 (three) times daily.    . hydroxychloroquine (PLAQUENIL) 200 MG tablet Take 200 mg by mouth daily.     . insulin aspart (NOVOLOG) 100 UNIT/ML injection Inject 10 Units into the skin 3 (three) times daily before meals.     . insulin  glargine (LANTUS) 100 UNIT/ML injection Inject 40-50 Units into the skin 2 (two) times daily. She takes 50 units in the morning and 40 units at bedtime.    . isosorbide mononitrate (IMDUR) 30 MG 24 hr tablet Take 30 mg by mouth every morning.    Marland Kitchen ketotifen (ALAWAY) 0.025 % ophthalmic solution Place 1 drop into both eyes daily.     Marland Kitchen lisinopril-hydrochlorothiazide (PRINZIDE,ZESTORETIC) 20-12.5 MG per tablet Take 1 tablet by mouth daily.    Marland Kitchen LORazepam (ATIVAN) 0.5 MG tablet Take 1 tablet (0.5 mg total) by mouth every 4 (four) hours as needed for anxiety (For anxiety with PET-CT or MRI imaging). 25 tablet 0  . OXYGEN Inhale into the lungs. 2L/min - prn during day and every evening.    . pravastatin (PRAVACHOL) 40 MG tablet Take 40 mg by mouth at bedtime.     . SYMBICORT 160-4.5 MCG/ACT inhaler Inhale 2 puffs into the lungs 2 (two) times daily. Reported on 09/15/2015    . tizanidine (ZANAFLEX) 2 MG capsule Take 2 mg by mouth 2 (two) times daily.    . traMADol (ULTRAM) 50 MG tablet Take 1 tablet (50 mg total) by mouth every 6 (six) hours as needed for severe pain. 60 tablet 0  . vitamin B-12 (CYANOCOBALAMIN) 1000 MCG tablet Take 1,000 mcg by mouth daily. Reported on 09/15/2015     No current facility-administered medications for this visit.     REVIEW OF SYSTEMS:    10 Point review of Systems was done is  negative except as noted above.  PHYSICAL EXAMINATION: ECOG PERFORMANCE STATUS: 2 - Symptomatic, <50% confined to bed  . Vitals:   11/13/17 1525  BP: (!) 132/55  Pulse: 83  Resp: 18  Temp: 98.5 F (36.9 C)  SpO2: 90%   Filed Weights   11/13/17 1525  Weight: 246 lb 4.8 oz (111.7 kg)   .Body mass index is 42.28 kg/m.  GENERAL:alert, in no acute distress and comfortable SKIN: no acute rashes, no significant lesions EYES: conjunctiva are pink and non-injected, sclera anicteric OROPHARYNX: MMM, no exudates, no oropharyngeal erythema or ulceration NECK: supple, no JVD LYMPH:  no  palpable lymphadenopathy in the cervical, axillary or inguinal regions LUNGS: clear to auscultation b/l with normal respiratory effort HEART: regular rate & rhythm ABDOMEN:  normoactive bowel sounds , non tender, not distended. Extremity: right 1+ pedal edema, left 2 or 3+ pedal edema with compression socks PSYCH: alert & oriented x 3 with fluent speech NEURO: no focal motor/sensory deficits  LABORATORY DATA:  I have reviewed the data as listed  . CBC Latest Ref Rng & Units 11/07/2017 08/08/2017 04/04/2017  WBC 3.9 - 10.3 K/uL 7.7 7.8 8.7  Hemoglobin 11.6 - 15.9 g/dL 9.6(L) 10.3(L) 10.8(L)  Hematocrit 34.8 - 46.6 % 30.2(L) 32.4(L) 33.1(L)  Platelets 145 - 400 K/uL 341 292 316   . CBC    Component Value Date/Time   WBC 7.7 11/07/2017 1009   WBC 8.7 04/04/2017 0721   RBC 3.52 (L) 11/07/2017 1009   HGB 9.6 (L) 11/07/2017 1009   HGB 10.9 (L) 03/15/2017 1202   HCT 30.2 (L) 11/07/2017 1009   HCT 33.1 (L) 03/15/2017 1202   PLT 341 11/07/2017 1009   PLT 316 03/15/2017 1202   MCV 85.8 11/07/2017 1009   MCV 86.4 03/15/2017 1202   MCH 27.4 11/07/2017 1009   MCHC 31.9 11/07/2017 1009   RDW 17.0 (H) 11/07/2017 1009   RDW 15.0 (H) 03/15/2017 1202   LYMPHSABS 1.1 11/07/2017 1009   LYMPHSABS 1.4 03/15/2017 1202   MONOABS 0.3 11/07/2017 1009   MONOABS 0.5 03/15/2017 1202   EOSABS 0.2 11/07/2017 1009   EOSABS 0.0 03/15/2017 1202   BASOSABS 0.1 11/07/2017 1009   BASOSABS 0.1 03/15/2017 1202    CMP Latest Ref Rng & Units 11/07/2017 08/08/2017 03/15/2017  Glucose 70 - 99 mg/dL 337(H) 389(H) 123  BUN 8 - 23 mg/dL 30(H) 30(H) 36.7(H)  Creatinine 0.44 - 1.00 mg/dL 1.71(H) 1.95(H) 2.0(H)  Sodium 135 - 145 mmol/L 141 139 140  Potassium 3.5 - 5.1 mmol/L 4.0 4.7 4.3  Chloride 98 - 111 mmol/L 106 105 -  CO2 22 - 32 mmol/L 26 21(L) 23  Calcium 8.9 - 10.3 mg/dL 10.2 10.5(H) 9.8  Total Protein 6.5 - 8.1 g/dL 7.5 7.7 7.8  Total Bilirubin 0.3 - 1.2 mg/dL 0.3 0.3 0.24  Alkaline Phos 38 - 126 U/L  90 84 95  AST 15 - 41 U/L 14(L) 18 22  ALT 0 - 44 U/L '12 17 18   '$ 04/06/17 BM Bx:   04/04/17 Cytogenetics:    RADIOGRAPHIC STUDIES: I have personally reviewed the radiological images as listed and agreed with the findings in the report. No results found.  ASSESSMENT & PLAN:   77 y.o. female with lupus, CKD, Diabetes Mellitus type II, gout and   1. MGUS (no smoldering myeloma despite > 10% plasma cells in the BM Bx since a large extent of those appeared to be reactive) -Discussed pt labwork from 11/07/17; HGB at  9.6, M Protein at 0.7 (no significant change from previous M spike of 0.6) 2. Anemia due to CKD? And anemia of chronic inflammation related to lupus/leg ulcers. -Suspect part of increase in plasma cells to be of lupus etiology, CKD and her open wounds -Will continue watching the pt and labs every 4 months -continue to optimize mx of lupus. -maintain ferritin >100 -if ferritin > 100 and lupus rx optimized and hgb <9 without progression of plasma cell dyscrasia - then would need to consider EPO  3. Patient Active Problem List   Diagnosis Date Noted  . 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 Abnormality of gait 08/05/2015  . Spinal stenosis of lumbar region 08/05/2015  . Peripheral neuropathy 08/05/2015  . Chest pain 12/28/2013  . Hyperlipidemia 04/05/2013  . Systemic lupus erythematosus ([place]) 04/05/2013  . Pulmonary embolism ([place]) 01/24/2012  . Chronic diastolic heart failure ([place]) 01/24/2012  . COPD (chronic obstructive pulmonary disease) ([place]) 01/24/2012  . Diabetes mellitus ([place]) 01/24/2012  . Hypertension 01/24/2012  . Leukocytosis 01/24/2012  . Bronchitis 01/24/2012  . Anemia 01/24/2012   -continue mx of other co-morbids with PCP   RTC with [name] [name] with labs in 4 months. plz schedule labs 1 week prior to clinic visit   All of the patients questions were answered with apparent satisfaction. The patient knows to call the clinic with any problems,  questions or concerns.  The total time spent in the appt was 35 minutes and more than 50% was on counseling and direct patient cares.    Sullivan Lone MD MS AAHIVMS [facility] Hematology/Oncology Physician [facility]  (Office):       [phone] (Work cell):  [phone] (Fax):           [phone]  11/13/2017 4:03 PM  I, [name] [name], am acting as a Education administrator for [name] [name].   .I have reviewed the above documentation for accuracy and completeness, and I agree with the above. [name] Genera MD

## 2017-11-13 ENCOUNTER — Telehealth: Payer: Self-pay | Admitting: Hematology

## 2017-11-13 ENCOUNTER — Inpatient Hospital Stay (HOSPITAL_BASED_OUTPATIENT_CLINIC_OR_DEPARTMENT_OTHER): Payer: Medicare HMO | Admitting: Hematology

## 2017-11-13 VITALS — BP 132/55 | HR 83 | Temp 98.5°F | Resp 18 | Ht 64.0 in | Wt 246.3 lb

## 2017-11-13 DIAGNOSIS — E119 Type 2 diabetes mellitus without complications: Secondary | ICD-10-CM | POA: Diagnosis not present

## 2017-11-13 DIAGNOSIS — M109 Gout, unspecified: Secondary | ICD-10-CM

## 2017-11-13 DIAGNOSIS — C9000 Multiple myeloma not having achieved remission: Secondary | ICD-10-CM | POA: Diagnosis not present

## 2017-11-13 DIAGNOSIS — Z87891 Personal history of nicotine dependence: Secondary | ICD-10-CM

## 2017-11-13 DIAGNOSIS — Z79899 Other long term (current) drug therapy: Secondary | ICD-10-CM

## 2017-11-13 DIAGNOSIS — E669 Obesity, unspecified: Secondary | ICD-10-CM

## 2017-11-13 DIAGNOSIS — Z86711 Personal history of pulmonary embolism: Secondary | ICD-10-CM

## 2017-11-13 DIAGNOSIS — J449 Chronic obstructive pulmonary disease, unspecified: Secondary | ICD-10-CM

## 2017-11-13 DIAGNOSIS — F419 Anxiety disorder, unspecified: Secondary | ICD-10-CM

## 2017-11-13 DIAGNOSIS — D472 Monoclonal gammopathy: Secondary | ICD-10-CM

## 2017-11-13 DIAGNOSIS — N189 Chronic kidney disease, unspecified: Secondary | ICD-10-CM

## 2017-11-13 DIAGNOSIS — I5032 Chronic diastolic (congestive) heart failure: Secondary | ICD-10-CM | POA: Diagnosis not present

## 2017-11-13 DIAGNOSIS — D631 Anemia in chronic kidney disease: Secondary | ICD-10-CM

## 2017-11-13 NOTE — Telephone Encounter (Signed)
Appointments scheduled AVS/Calendar printed per 7/29 ,los

## 2017-11-14 ENCOUNTER — Ambulatory Visit: Payer: Medicare HMO | Admitting: Hematology and Oncology

## 2017-11-20 ENCOUNTER — Encounter (HOSPITAL_BASED_OUTPATIENT_CLINIC_OR_DEPARTMENT_OTHER): Payer: Medicare HMO | Attending: Internal Medicine

## 2017-11-20 DIAGNOSIS — L97211 Non-pressure chronic ulcer of right calf limited to breakdown of skin: Secondary | ICD-10-CM | POA: Diagnosis not present

## 2017-11-20 DIAGNOSIS — I87332 Chronic venous hypertension (idiopathic) with ulcer and inflammation of left lower extremity: Secondary | ICD-10-CM | POA: Insufficient documentation

## 2017-11-20 DIAGNOSIS — I890 Lymphedema, not elsewhere classified: Secondary | ICD-10-CM | POA: Insufficient documentation

## 2017-11-21 LAB — GLUCOSE, CAPILLARY: GLUCOSE-CAPILLARY: 98 mg/dL (ref 70–99)

## 2017-11-24 DIAGNOSIS — L97829 Non-pressure chronic ulcer of other part of left lower leg with unspecified severity: Secondary | ICD-10-CM | POA: Diagnosis not present

## 2017-11-24 DIAGNOSIS — I890 Lymphedema, not elsewhere classified: Secondary | ICD-10-CM | POA: Diagnosis not present

## 2017-11-24 DIAGNOSIS — I87312 Chronic venous hypertension (idiopathic) with ulcer of left lower extremity: Secondary | ICD-10-CM | POA: Diagnosis not present

## 2017-11-24 DIAGNOSIS — I87332 Chronic venous hypertension (idiopathic) with ulcer and inflammation of left lower extremity: Secondary | ICD-10-CM | POA: Diagnosis not present

## 2017-11-24 DIAGNOSIS — L97211 Non-pressure chronic ulcer of right calf limited to breakdown of skin: Secondary | ICD-10-CM | POA: Diagnosis not present

## 2017-11-28 DIAGNOSIS — N183 Chronic kidney disease, stage 3 (moderate): Secondary | ICD-10-CM | POA: Diagnosis not present

## 2017-11-28 DIAGNOSIS — I10 Essential (primary) hypertension: Secondary | ICD-10-CM | POA: Diagnosis not present

## 2017-11-28 DIAGNOSIS — E1140 Type 2 diabetes mellitus with diabetic neuropathy, unspecified: Secondary | ICD-10-CM | POA: Diagnosis not present

## 2017-11-28 DIAGNOSIS — J4520 Mild intermittent asthma, uncomplicated: Secondary | ICD-10-CM | POA: Diagnosis not present

## 2017-11-28 DIAGNOSIS — I872 Venous insufficiency (chronic) (peripheral): Secondary | ICD-10-CM | POA: Diagnosis not present

## 2017-11-28 DIAGNOSIS — I509 Heart failure, unspecified: Secondary | ICD-10-CM | POA: Diagnosis not present

## 2017-12-04 DIAGNOSIS — J449 Chronic obstructive pulmonary disease, unspecified: Secondary | ICD-10-CM | POA: Diagnosis not present

## 2018-01-02 DIAGNOSIS — N189 Chronic kidney disease, unspecified: Secondary | ICD-10-CM | POA: Diagnosis not present

## 2018-01-02 DIAGNOSIS — R351 Nocturia: Secondary | ICD-10-CM | POA: Diagnosis not present

## 2018-01-02 DIAGNOSIS — N3946 Mixed incontinence: Secondary | ICD-10-CM | POA: Diagnosis not present

## 2018-01-02 DIAGNOSIS — Z79899 Other long term (current) drug therapy: Secondary | ICD-10-CM | POA: Diagnosis not present

## 2018-01-02 DIAGNOSIS — D472 Monoclonal gammopathy: Secondary | ICD-10-CM | POA: Diagnosis not present

## 2018-01-02 DIAGNOSIS — M25569 Pain in unspecified knee: Secondary | ICD-10-CM | POA: Diagnosis not present

## 2018-01-02 DIAGNOSIS — R350 Frequency of micturition: Secondary | ICD-10-CM | POA: Diagnosis not present

## 2018-01-02 DIAGNOSIS — L932 Other local lupus erythematosus: Secondary | ICD-10-CM | POA: Diagnosis not present

## 2018-01-02 DIAGNOSIS — N3944 Nocturnal enuresis: Secondary | ICD-10-CM | POA: Diagnosis not present

## 2018-01-02 DIAGNOSIS — M109 Gout, unspecified: Secondary | ICD-10-CM | POA: Diagnosis not present

## 2018-01-02 DIAGNOSIS — M3500 Sicca syndrome, unspecified: Secondary | ICD-10-CM | POA: Diagnosis not present

## 2018-01-02 DIAGNOSIS — M7042 Prepatellar bursitis, left knee: Secondary | ICD-10-CM | POA: Diagnosis not present

## 2018-01-02 DIAGNOSIS — M1990 Unspecified osteoarthritis, unspecified site: Secondary | ICD-10-CM | POA: Diagnosis not present

## 2018-01-04 ENCOUNTER — Ambulatory Visit (INDEPENDENT_AMBULATORY_CARE_PROVIDER_SITE_OTHER): Payer: Medicare HMO | Admitting: Specialist

## 2018-01-04 DIAGNOSIS — J449 Chronic obstructive pulmonary disease, unspecified: Secondary | ICD-10-CM | POA: Diagnosis not present

## 2018-01-10 ENCOUNTER — Encounter (INDEPENDENT_AMBULATORY_CARE_PROVIDER_SITE_OTHER): Payer: Self-pay | Admitting: Specialist

## 2018-01-10 ENCOUNTER — Ambulatory Visit (INDEPENDENT_AMBULATORY_CARE_PROVIDER_SITE_OTHER): Payer: Medicare HMO

## 2018-01-10 ENCOUNTER — Ambulatory Visit (INDEPENDENT_AMBULATORY_CARE_PROVIDER_SITE_OTHER): Payer: Medicare HMO | Admitting: Specialist

## 2018-01-10 VITALS — BP 133/67 | HR 83 | Ht 64.0 in | Wt 236.0 lb

## 2018-01-10 DIAGNOSIS — M4316 Spondylolisthesis, lumbar region: Secondary | ICD-10-CM

## 2018-01-10 DIAGNOSIS — M48062 Spinal stenosis, lumbar region with neurogenic claudication: Secondary | ICD-10-CM | POA: Diagnosis not present

## 2018-01-10 DIAGNOSIS — M170 Bilateral primary osteoarthritis of knee: Secondary | ICD-10-CM

## 2018-01-10 DIAGNOSIS — M5136 Other intervertebral disc degeneration, lumbar region: Secondary | ICD-10-CM | POA: Diagnosis not present

## 2018-01-10 DIAGNOSIS — M3219 Other organ or system involvement in systemic lupus erythematosus: Secondary | ICD-10-CM

## 2018-01-10 NOTE — Patient Instructions (Signed)
  Knee is suffering from osteoarthritis, only real proven treatments are Weight loss, NSIADs like tylenol and exercise. Well padded shoes help. Ice the knee 2-3 times a day 15-20 mins at a time. Avoid bending, stooping and avoid lifting weights greater than 10 lbs. Avoid prolong standing and walking. Avoid frequent bending and stooping  No lifting greater than 10 lbs. May use ice or moist heat for pain. Weight loss is o f benefit. Handicap license is approved. Call if you wish to consider a lumbar steriod injection and we would have Dr. Romona Curls secretary/Assistant call to arrange for epidural steroid injection Avoid bending, stooping and avoid lifting weights greater than 10 lbs. Avoid prolong standing and walking. Order for a new walker with wheels.

## 2018-01-10 NOTE — Progress Notes (Signed)
Office Visit Note   Patient: [name] [name]           Date of Birth: [birth year]           MRN: [id number] Visit Date: 01/10/2018              Requested by: Nolene Ebbs, MD [address] [place], [place] [address] PCP: Nolene Ebbs, MD   Assessment & Plan: Visit Diagnoses:  1. Degenerative disc disease, lumbar   2. Bilateral primary osteoarthritis of knee   3. Other organ or system involvement in systemic lupus erythematosus ([place])   4. Spinal stenosis of lumbar region with neurogenic claudication   5. Spondylolisthesis, lumbar region     Plan: Knee is suffering from osteoarthritis, only real proven treatments are Weight loss, NSIADs like tylenol and exercise. Well padded shoes help. Ice the knee 2-3 times a day 15-20 mins at a time. Avoid bending, stooping and avoid lifting weights greater than 10 lbs. Avoid prolong standing and walking. Avoid frequent bending and stooping  No lifting greater than 10 lbs. May use ice or moist heat for pain. Weight loss is of benefit. Handicap license is approved. Call if you wish to consider a lumbar steriod injection and we would have Dr. Romona Curls secretary/Assistant call to arrange for epidural steroid injection Avoid bending, stooping and avoid lifting weights greater than 10 lbs. Avoid prolong standing and walking. Order for a new walker with wheels.   Follow-Up Instructions: Return in about 3 months (around 04/11/2018).   Orders:  Orders Placed This Encounter  Procedures  . XR Lumbar Spine 2-3 Views   No orders of the defined types were placed in this encounter.     Procedures: Large Joint Inj: bilateral knee on 01/10/2018 11:32 AM Indications: pain Details: 25 G 1.5 in needle, anterolateral approach  Arthrogram: No  Medications (Right): 4 mL bupivacaine 0.25 %; 40 mg methylPREDNISolone acetate 40 MG/ML Medications (Left): 4 mL bupivacaine 0.25 %; 40 mg methylPREDNISolone acetate 40 MG/ML Outcome: tolerated well, no  immediate complications  bandaids applied Procedure, treatment alternatives, risks and benefits explained, specific risks discussed. Consent was given by the patient. Immediately prior to procedure a time out was called to verify the correct patient, procedure, equipment, support staff and site/side marked as required. Patient was prepped and draped in the usual sterile fashion.       Clinical Data: Findings:  xrays of the knees from 2018 show bilateral mild medial joint line osteoarthritis changes with joint line narrowing,  Mild osteophytes off the lateral joint line.     Subjective: Chief Complaint  Patient presents with  . Lower Back - Follow-up    HPI  Review of Systems  Constitutional: Negative.   HENT: Negative.   Eyes: Negative.   Respiratory: Negative.   Cardiovascular: Negative.   Gastrointestinal: Negative.   Endocrine: Negative.   Genitourinary: Negative.   Musculoskeletal: Negative.   Skin: Negative.   Allergic/Immunologic: Negative.   Neurological: Negative.   Hematological: Negative.   Psychiatric/Behavioral: Negative.      Objective: Vital Signs: BP 133/67 (BP Location: Left Arm, Patient Position: Sitting)   Pulse 83   Ht 5' 4" (1.626 m)   Wt 236 lb (107 kg)   BMI 40.51 kg/m   Physical Exam  Constitutional: She is oriented to person, place, and time. She appears well-developed and well-nourished.  HENT:  Head: Normocephalic and atraumatic.  Eyes: Pupils are equal, round, and reactive to light. EOM are normal.  Neck: Normal  range of motion. Neck supple.  Pulmonary/Chest: Effort normal and breath sounds normal.  Abdominal: Soft. Bowel sounds are normal.  Musculoskeletal:       Right knee: She exhibits no effusion.       Left knee: She exhibits no effusion.  Neurological: She is alert and oriented to person, place, and time.  Skin: Skin is warm and dry.  Psychiatric: She has a normal mood and affect. Her behavior is normal. Judgment and  thought content normal.    Right Knee Exam   Muscle Strength  The patient has normal right knee strength.  Tenderness  The patient is experiencing tenderness in the lateral joint line and medial joint line.  Range of Motion  Extension: -5  Flexion:  120 abnormal   Tests  McMurray:  Medial - positive  Varus: negative Valgus: negative Lachman:  Anterior - negative    Posterior - negative Drawer:  Anterior - negative    Posterior - negative Pivot shift: negative Patellar apprehension: negative  Other  Erythema: absent Scars: absent Sensation: normal Pulse: present Swelling: mild Effusion: no effusion present   Left Knee Exam   Range of Motion  Extension: -5  Flexion: 110   Tests  McMurray:  Medial - positive  Lachman:  Anterior - negative    Posterior - negative Drawer:  Anterior - negative     Posterior - negative Pivot shift: negative Patellar apprehension: negative  Other  Erythema: absent Scars: absent Sensation: normal Pulse: present Swelling: mild Effusion: no effusion present      Specialty Comments:  No specialty comments available.  Imaging: Xr Lumbar Spine 2-3 Views  Result Date: 01/10/2018 AP and lateral flexion and extension radiographs with fusion L4-5 with pedicle screws, rods and cage. Fusion is solid. There is severe DDD L3-4 with severe disc narrowing and grade one anterolisthesis worsens with flexion. Vacuum sign L3-4. Previous MRI from 2018 with moderate spinal stenosis.     PMFS History: Patient Active Problem List   Diagnosis Date Noted  . 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 Abnormality of gait 08/05/2015  . Spinal stenosis of lumbar region 08/05/2015  . Peripheral neuropathy 08/05/2015  . Chest pain 12/28/2013  . Hyperlipidemia 04/05/2013  . Systemic lupus erythematosus (HCC) 04/05/2013  . Pulmonary embolism (HCC) 01/24/2012  . Chronic diastolic heart failure (HCC) 01/24/2012  . COPD (chronic obstructive  pulmonary disease) (HCC) 01/24/2012  . Diabetes mellitus (HCC) 01/24/2012  . Hypertension 01/24/2012  . Leukocytosis 01/24/2012  . Bronchitis 01/24/2012  . Anemia 01/24/2012   Past Medical History:  Diagnosis Date  . Allergic rhinitis   . Anxiety   . Arthritis   . Asthma   . Cancer (HCC) 2006   breast cancer right  . CHF (congestive heart failure) (HCC)   . COPD (chronic obstructive pulmonary disease) (HCC)   . Degenerative disc disease, lumbar   . Diabetes mellitus without complication (HCC)   . Dyspnea    walking distances  . Eczema   . GERD (gastroesophageal reflux disease)   . Glaucoma   . History of home oxygen therapy    uses 2 liters ay night and prn  . Hyperlipidemia   . Hypertension   . Low back pain   . Lupus (HCC)    skin  . Neck pain   . Numbness and tingling   . Obesity   . Osteopenia   . Pulmonary embolism (HCC) 01/2012    CT showed multi small PE and coumadized   .   Sleep apnea 2010   no cpap used  . Systemic lupus erythematosus (HCC)     Family History  Problem Relation Age of Onset  . Heart failure Father   . Heart failure Mother   . Diabetes Brother   . Breast cancer Maternal Aunt   . Breast cancer Paternal Aunt     Past Surgical History:  Procedure Laterality Date  . ABDOMINAL HYSTERECTOMY  1976  . BACK SURGERY  2006   lower  . BREAST BIOPSY Right   . BREAST EXCISIONAL BIOPSY Left   . BREAST LUMPECTOMY Right   . COLONOSCOPY WITH PROPOFOL N/A 03/23/2015   Procedure: COLONOSCOPY WITH PROPOFOL;  Surgeon: Laurence Spates, MD;  Location: [facility];  Service: Endoscopy;  Laterality: N/A;  . Dobtamine myoview  05/02/2008   EF 67% ; LV norm  . DOPPLER ECHOCARDIOGRAPHY  01/25/2012   EF 55 TO 60%; LV norm.  . EXPLORATORY LAPAROTOMY    . EYE SURGERY Bilateral 2010   lens reaplcments for cataracts   . Lower Extrem. venous doppler  01/25/2012    neg.  . TOE SURGERY  1996   Bunion   Social History   Occupational History  . Occupation:  Disabled  Tobacco Use  . Smoking status: Former Research scientist (life sciences)  . Smokeless tobacco: Never Used  . Tobacco comment: Quit 1980  Substance and Sexual Activity  . Alcohol use: No    Alcohol/week: 0.0 standard drinks  . Drug use: No  . Sexual activity: Not on file

## 2018-01-11 DIAGNOSIS — M25569 Pain in unspecified knee: Secondary | ICD-10-CM | POA: Diagnosis not present

## 2018-01-11 DIAGNOSIS — E119 Type 2 diabetes mellitus without complications: Secondary | ICD-10-CM | POA: Diagnosis not present

## 2018-01-11 DIAGNOSIS — L932 Other local lupus erythematosus: Secondary | ICD-10-CM | POA: Diagnosis not present

## 2018-01-11 DIAGNOSIS — N189 Chronic kidney disease, unspecified: Secondary | ICD-10-CM | POA: Diagnosis not present

## 2018-01-11 DIAGNOSIS — M3500 Sicca syndrome, unspecified: Secondary | ICD-10-CM | POA: Diagnosis not present

## 2018-01-11 DIAGNOSIS — M109 Gout, unspecified: Secondary | ICD-10-CM | POA: Diagnosis not present

## 2018-01-11 DIAGNOSIS — M1990 Unspecified osteoarthritis, unspecified site: Secondary | ICD-10-CM | POA: Diagnosis not present

## 2018-01-11 DIAGNOSIS — D472 Monoclonal gammopathy: Secondary | ICD-10-CM | POA: Diagnosis not present

## 2018-01-11 DIAGNOSIS — Z79899 Other long term (current) drug therapy: Secondary | ICD-10-CM | POA: Diagnosis not present

## 2018-01-23 DIAGNOSIS — E1140 Type 2 diabetes mellitus with diabetic neuropathy, unspecified: Secondary | ICD-10-CM | POA: Diagnosis not present

## 2018-01-23 DIAGNOSIS — J4520 Mild intermittent asthma, uncomplicated: Secondary | ICD-10-CM | POA: Diagnosis not present

## 2018-01-23 DIAGNOSIS — M1A9XX0 Chronic gout, unspecified, without tophus (tophi): Secondary | ICD-10-CM | POA: Diagnosis not present

## 2018-01-23 DIAGNOSIS — I509 Heart failure, unspecified: Secondary | ICD-10-CM | POA: Diagnosis not present

## 2018-01-23 DIAGNOSIS — Z23 Encounter for immunization: Secondary | ICD-10-CM | POA: Diagnosis not present

## 2018-01-23 DIAGNOSIS — N183 Chronic kidney disease, stage 3 (moderate): Secondary | ICD-10-CM | POA: Diagnosis not present

## 2018-01-23 DIAGNOSIS — I10 Essential (primary) hypertension: Secondary | ICD-10-CM | POA: Diagnosis not present

## 2018-01-25 DIAGNOSIS — I5032 Chronic diastolic (congestive) heart failure: Secondary | ICD-10-CM | POA: Diagnosis not present

## 2018-01-25 DIAGNOSIS — I129 Hypertensive chronic kidney disease with stage 1 through stage 4 chronic kidney disease, or unspecified chronic kidney disease: Secondary | ICD-10-CM | POA: Diagnosis not present

## 2018-01-25 DIAGNOSIS — N189 Chronic kidney disease, unspecified: Secondary | ICD-10-CM | POA: Diagnosis not present

## 2018-01-25 DIAGNOSIS — M329 Systemic lupus erythematosus, unspecified: Secondary | ICD-10-CM | POA: Diagnosis not present

## 2018-01-25 DIAGNOSIS — Z86711 Personal history of pulmonary embolism: Secondary | ICD-10-CM | POA: Diagnosis not present

## 2018-01-25 DIAGNOSIS — D472 Monoclonal gammopathy: Secondary | ICD-10-CM | POA: Diagnosis not present

## 2018-01-25 DIAGNOSIS — N2581 Secondary hyperparathyroidism of renal origin: Secondary | ICD-10-CM | POA: Diagnosis not present

## 2018-01-25 DIAGNOSIS — D631 Anemia in chronic kidney disease: Secondary | ICD-10-CM | POA: Diagnosis not present

## 2018-01-25 DIAGNOSIS — N184 Chronic kidney disease, stage 4 (severe): Secondary | ICD-10-CM | POA: Diagnosis not present

## 2018-01-25 DIAGNOSIS — E1122 Type 2 diabetes mellitus with diabetic chronic kidney disease: Secondary | ICD-10-CM | POA: Diagnosis not present

## 2018-02-03 DIAGNOSIS — J449 Chronic obstructive pulmonary disease, unspecified: Secondary | ICD-10-CM | POA: Diagnosis not present

## 2018-02-08 DIAGNOSIS — E1351 Other specified diabetes mellitus with diabetic peripheral angiopathy without gangrene: Secondary | ICD-10-CM | POA: Diagnosis not present

## 2018-02-08 DIAGNOSIS — B351 Tinea unguium: Secondary | ICD-10-CM | POA: Diagnosis not present

## 2018-02-09 DIAGNOSIS — N3946 Mixed incontinence: Secondary | ICD-10-CM | POA: Diagnosis not present

## 2018-02-09 DIAGNOSIS — R350 Frequency of micturition: Secondary | ICD-10-CM | POA: Diagnosis not present

## 2018-02-26 DIAGNOSIS — M25562 Pain in left knee: Secondary | ICD-10-CM | POA: Diagnosis not present

## 2018-02-26 DIAGNOSIS — M25561 Pain in right knee: Secondary | ICD-10-CM | POA: Diagnosis not present

## 2018-02-26 DIAGNOSIS — M170 Bilateral primary osteoarthritis of knee: Secondary | ICD-10-CM | POA: Diagnosis not present

## 2018-03-06 DIAGNOSIS — J449 Chronic obstructive pulmonary disease, unspecified: Secondary | ICD-10-CM | POA: Diagnosis not present

## 2018-03-06 MED ORDER — BUPIVACAINE HCL 0.25 % IJ SOLN
4.0000 mL | INTRAMUSCULAR | Status: AC | PRN
  Administered 2018-01-10: 4 mL via INTRA_ARTICULAR

## 2018-03-06 MED ORDER — METHYLPREDNISOLONE ACETATE 40 MG/ML IJ SUSP
40.0000 mg | INTRAMUSCULAR | Status: AC | PRN
  Administered 2018-01-10: 40 mg via INTRA_ARTICULAR

## 2018-03-09 ENCOUNTER — Inpatient Hospital Stay: Payer: Medicare HMO | Attending: Internal Medicine

## 2018-03-13 DIAGNOSIS — M25562 Pain in left knee: Secondary | ICD-10-CM | POA: Diagnosis not present

## 2018-03-13 DIAGNOSIS — N189 Chronic kidney disease, unspecified: Secondary | ICD-10-CM | POA: Diagnosis not present

## 2018-03-13 DIAGNOSIS — N2581 Secondary hyperparathyroidism of renal origin: Secondary | ICD-10-CM | POA: Diagnosis not present

## 2018-03-13 DIAGNOSIS — M1712 Unilateral primary osteoarthritis, left knee: Secondary | ICD-10-CM | POA: Diagnosis not present

## 2018-03-16 ENCOUNTER — Inpatient Hospital Stay: Payer: Medicare HMO | Admitting: Hematology

## 2018-03-19 ENCOUNTER — Telehealth: Payer: Self-pay | Admitting: Hematology

## 2018-03-19 NOTE — Telephone Encounter (Signed)
Called pt per 11/29 sch message to r./s missed appt - left message for patient to call back to r//s

## 2018-03-20 DIAGNOSIS — M25561 Pain in right knee: Secondary | ICD-10-CM | POA: Diagnosis not present

## 2018-03-20 DIAGNOSIS — M1711 Unilateral primary osteoarthritis, right knee: Secondary | ICD-10-CM | POA: Diagnosis not present

## 2018-03-23 ENCOUNTER — Telehealth: Payer: Self-pay | Admitting: Hematology

## 2018-03-23 NOTE — Telephone Encounter (Signed)
Called regarding 12/17 [name] [name] said ok if she needs to reschedule she will call back

## 2018-03-27 DIAGNOSIS — M25562 Pain in left knee: Secondary | ICD-10-CM | POA: Diagnosis not present

## 2018-03-27 DIAGNOSIS — M1712 Unilateral primary osteoarthritis, left knee: Secondary | ICD-10-CM | POA: Diagnosis not present

## 2018-04-03 ENCOUNTER — Inpatient Hospital Stay: Payer: Medicare HMO | Attending: Internal Medicine

## 2018-04-03 DIAGNOSIS — N189 Chronic kidney disease, unspecified: Secondary | ICD-10-CM | POA: Diagnosis not present

## 2018-04-03 DIAGNOSIS — M25569 Pain in unspecified knee: Secondary | ICD-10-CM | POA: Diagnosis not present

## 2018-04-03 DIAGNOSIS — Z79899 Other long term (current) drug therapy: Secondary | ICD-10-CM | POA: Diagnosis not present

## 2018-04-03 DIAGNOSIS — D472 Monoclonal gammopathy: Secondary | ICD-10-CM | POA: Insufficient documentation

## 2018-04-03 DIAGNOSIS — M1990 Unspecified osteoarthritis, unspecified site: Secondary | ICD-10-CM | POA: Diagnosis not present

## 2018-04-03 DIAGNOSIS — M109 Gout, unspecified: Secondary | ICD-10-CM | POA: Diagnosis not present

## 2018-04-03 DIAGNOSIS — L932 Other local lupus erythematosus: Secondary | ICD-10-CM | POA: Diagnosis not present

## 2018-04-03 DIAGNOSIS — D6489 Other specified anemias: Secondary | ICD-10-CM | POA: Insufficient documentation

## 2018-04-03 DIAGNOSIS — L97909 Non-pressure chronic ulcer of unspecified part of unspecified lower leg with unspecified severity: Secondary | ICD-10-CM | POA: Insufficient documentation

## 2018-04-03 DIAGNOSIS — M3500 Sicca syndrome, unspecified: Secondary | ICD-10-CM | POA: Diagnosis not present

## 2018-04-03 DIAGNOSIS — M329 Systemic lupus erythematosus, unspecified: Secondary | ICD-10-CM | POA: Insufficient documentation

## 2018-04-05 ENCOUNTER — Telehealth: Payer: Self-pay | Admitting: *Deleted

## 2018-04-05 ENCOUNTER — Ambulatory Visit (INDEPENDENT_AMBULATORY_CARE_PROVIDER_SITE_OTHER): Payer: Medicare HMO | Admitting: Specialist

## 2018-04-05 ENCOUNTER — Encounter (HOSPITAL_COMMUNITY): Payer: Medicare HMO

## 2018-04-05 ENCOUNTER — Encounter (INDEPENDENT_AMBULATORY_CARE_PROVIDER_SITE_OTHER): Payer: Self-pay | Admitting: Specialist

## 2018-04-05 VITALS — BP 139/71 | HR 78 | Ht 64.0 in | Wt 236.0 lb

## 2018-04-05 DIAGNOSIS — M48062 Spinal stenosis, lumbar region with neurogenic claudication: Secondary | ICD-10-CM

## 2018-04-05 DIAGNOSIS — G5623 Lesion of ulnar nerve, bilateral upper limbs: Secondary | ICD-10-CM

## 2018-04-05 DIAGNOSIS — M47812 Spondylosis without myelopathy or radiculopathy, cervical region: Secondary | ICD-10-CM | POA: Diagnosis not present

## 2018-04-05 DIAGNOSIS — J449 Chronic obstructive pulmonary disease, unspecified: Secondary | ICD-10-CM | POA: Diagnosis not present

## 2018-04-05 DIAGNOSIS — M19012 Primary osteoarthritis, left shoulder: Secondary | ICD-10-CM

## 2018-04-05 MED ORDER — GABAPENTIN 300 MG PO CAPS: 300.0000 mg | ORAL_CAPSULE | Freq: Two times a day (BID) | ORAL | 6 refills | Status: AC

## 2018-04-05 MED ORDER — TRAMADOL HCL 50 MG PO TABS: 50.0000 mg | ORAL_TABLET | Freq: Four times a day (QID) | ORAL | 0 refills | Status: DC | PRN

## 2018-04-05 NOTE — Patient Instructions (Signed)
  Plan: Avoid bending, stooping and avoid lifting weights greater than 10 lbs. Avoid prolong standing and walking. Avoid frequent bending and stooping  No lifting greater than 10 lbs. May use ice or moist heat for pain. Weight loss is of benefit. Handicap license is approved. Avoid overhead lifting and overhead use of the arms. Do not lift greater than 5 lbs. Adjust head rest in vehicle to prevent hyperextension if rear ended. Take extra precautions to avoid falling.   

## 2018-04-05 NOTE — Progress Notes (Signed)
Office Visit Note   Patient: [name] [name]           Date of Birth: 1940/08/31           MRN: [id number] Visit Date: 04/05/2018              Requested by: Nolene Ebbs, MD [address] [place], [place] [address] PCP: Nolene Ebbs, MD   Assessment & Plan: Visit Diagnoses:  1. Spinal stenosis of lumbar region with neurogenic claudication   2. Spondylosis without myelopathy or radiculopathy, cervical region     Plan: Avoid bending, stooping and avoid lifting weights greater than 10 lbs. Avoid prolong standing and walking. Avoid frequent bending and stooping  No lifting greater than 10 lbs. May use ice or moist heat for pain. Weight loss is of benefit. Handicap license is approved. Avoid overhead lifting and overhead use of the arms. Do not lift greater than 5 lbs. Adjust head rest in vehicle to prevent hyperextension if rear ended. Take extra precautions to avoid falling.   Follow-Up Instructions: Return in about 3 months (around 07/05/2018).   Orders:  No orders of the defined types were placed in this encounter.  No orders of the defined types were placed in this encounter.     Procedures: No procedures performed   Clinical Data: No additional findings.   Subjective: Chief Complaint  Patient presents with  . Lower Back - Follow-up    77 year old female with history of low back pain and leg pain. Her pain is "0" on a scale of 1-10. She is taking medication for pain tramadol and neurontin. Has dorsal Kyphosis and consequently extension of the next to see ahead. There is grating with lateral movement of the neck. No upper extremity numbness.    Review of Systems   Objective: Vital Signs: BP 139/71 (BP Location: Left Arm, Patient Position: Sitting)   Pulse 78   Ht '5\' 4"'$  (1.626 m)   Wt 236 lb (107 kg)   BMI 40.51 kg/m   Physical Exam Constitutional:      Appearance: She is well-developed.  HENT:     Head: Normocephalic and atraumatic.  Eyes:       Pupils: Pupils are equal, round, and reactive to light.  Neck:     Musculoskeletal: Normal range of motion and neck supple.  Pulmonary:     Effort: Pulmonary effort is normal.     Breath sounds: Normal breath sounds.  Abdominal:     General: Bowel sounds are normal.     Palpations: Abdomen is soft.  Skin:    General: Skin is warm and dry.  Neurological:     Mental Status: She is alert and oriented to person, place, and time.  Psychiatric:        Behavior: Behavior normal.        Thought Content: Thought content normal.        Judgment: Judgment normal.     Back Exam   Tenderness  The patient is experiencing tenderness in the lumbar.  Range of Motion  Extension: 20  Flexion: 90  Lateral bend right:  50 abnormal  Lateral bend left:  50 abnormal  Rotation right: 60  Rotation left: 30   Muscle Strength  Right Quadriceps:  5/5  Left Quadriceps:  5/5  Right Hamstrings:  5/5  Left Hamstrings:  5/5   Tests  Straight leg raise right: negative Straight leg raise left: negative  Reflexes  Patellar: 2/4 Achilles: 2/4 Babinski's  sign: normal   Other  Toe walk: normal Heel walk: normal Sensation: normal Gait: normal  Erythema: no back redness Scars: absent      Specialty Comments:  No specialty comments available.  Imaging: No results found.   PMFS History: Patient Active Problem List   Diagnosis Date Noted  . Smoldering multiple myeloma ([place]) 04/17/2017  . Abnormality of gait 08/05/2015  . Spinal stenosis of lumbar region 08/05/2015  . Peripheral neuropathy 08/05/2015  . Chest pain 12/28/2013  . Hyperlipidemia 04/05/2013  . Systemic lupus erythematosus ([place]) 04/05/2013  . Pulmonary embolism ([place]) 01/24/2012  . Chronic diastolic heart failure ([place]) 01/24/2012  . COPD (chronic obstructive pulmonary disease) ([place]) 01/24/2012  . Diabetes mellitus ([place]) 01/24/2012  . Hypertension 01/24/2012  . Leukocytosis 01/24/2012  . Bronchitis 01/24/2012  .  Anemia 01/24/2012   Past Medical History:  Diagnosis Date  . Allergic rhinitis   . Anxiety   . Arthritis   . Asthma   . Cancer [facility]) 2006   breast cancer right  . CHF (congestive heart failure) ([place])   . COPD (chronic obstructive pulmonary disease) ([place])   . Degenerative disc disease, lumbar   . Diabetes mellitus without complication ([place])   . Dyspnea    walking distances  . Eczema   . GERD (gastroesophageal reflux disease)   . Glaucoma   . History of home oxygen therapy    uses 2 liters ay night and prn  . Hyperlipidemia   . Hypertension   . Low back pain   . Lupus ([place])    skin  . Neck pain   . Numbness and tingling   . Obesity   . Osteopenia   . Pulmonary embolism ([place]) 01/2012    CT showed multi small PE and coumadized   . Sleep apnea 2010   no cpap used  . Systemic lupus erythematosus (HCC)     Family History  Problem Relation Age of Onset  . Heart failure Father   . Heart failure Mother   . Diabetes Brother   . Breast cancer Maternal Aunt   . Breast cancer Paternal Aunt     Past Surgical History:  Procedure Laterality Date  . ABDOMINAL HYSTERECTOMY  1976  . BACK SURGERY  2006   lower  . BREAST BIOPSY Right   . BREAST EXCISIONAL BIOPSY Left   . BREAST LUMPECTOMY Right   . COLONOSCOPY WITH PROPOFOL N/A 03/23/2015   Procedure: COLONOSCOPY WITH PROPOFOL;  Surgeon: Laurence Spates, MD;  Location: [facility];  Service: Endoscopy;  Laterality: N/A;  . Dobtamine myoview  05/02/2008   EF 67% ; LV norm  . DOPPLER ECHOCARDIOGRAPHY  01/25/2012   EF 55 TO 60%; LV norm.  . EXPLORATORY LAPAROTOMY    . EYE SURGERY Bilateral 2010   lens reaplcments for cataracts   . Lower Extrem. venous doppler  01/25/2012    neg.  . TOE SURGERY  1996   Bunion   Social History   Occupational History  . Occupation: Disabled  Tobacco Use  . Smoking status: Former Research scientist (life sciences)  . Smokeless tobacco: Never Used  . Tobacco comment: Quit 1980  Substance and Sexual Activity  . Alcohol  use: No    Alcohol/week: 0.0 standard drinks  . Drug use: No  . Sexual activity: Not on file

## 2018-04-05 NOTE — Telephone Encounter (Signed)
Received labs from 03/13/18 faxed from [facility].

## 2018-04-05 NOTE — Telephone Encounter (Signed)
Contacted by [employer] staff member Marzetta Board [phone]) on behalf of Dr. Justin Mend.  She left VM: Has appt with Dr. Irene Limbo 12/24 @ 2pm.  Dr. Justin Mend wanted to know if this patient could have Feraheme while here next week: 2 doses (?)  Last appt was in July. She was No Show 11/29. Patient had Feraheme scheduled for 12/19, but it was cancelled - reason not known.  Contacted Stacy, left VM to call back with further information.

## 2018-04-06 ENCOUNTER — Telehealth: Payer: Self-pay | Admitting: *Deleted

## 2018-04-06 ENCOUNTER — Telehealth: Payer: Self-pay | Admitting: Hematology

## 2018-04-06 NOTE — Telephone Encounter (Signed)
Scheduled appt per 12/20 sch message - pt is aware of appt

## 2018-04-06 NOTE — Telephone Encounter (Signed)
Contacted patient per Dr. Grier Mitts instructions: Will need lab appt before MD appt on 12/24 and will be scheduled for first dose of Feraheme on 12/24 after MD appt and second dose on 12/31. Sent scheduling message.  Left message on patient's VM - asked to contact office for questions or concerns.

## 2018-04-09 DIAGNOSIS — M170 Bilateral primary osteoarthritis of knee: Secondary | ICD-10-CM | POA: Diagnosis not present

## 2018-04-09 DIAGNOSIS — M25561 Pain in right knee: Secondary | ICD-10-CM | POA: Diagnosis not present

## 2018-04-09 NOTE — Progress Notes (Signed)
HEMATOLOGY/ONCOLOGY CONSULTATION NOTE  Date of Service: 04/10/2018  Patient Care Team: Nolene Ebbs, MD as PCP - General (Internal Medicine)  CHIEF COMPLAINTS/PURPOSE OF CONSULTATION:  MGUS   HISTORY OF PRESENTING ILLNESS:  [name] [name] is a wonderful 77 y.o. female who has been previously followed by my colleague Dr. 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 She is accompanied today by her sister-in-law. The pt reports that she is doing well overall.   The pt reports that she has been having some blisters and open wounds related to her leg swelling, and is being followed by a wound clinic for this. She denies any other concerning symptoms or developments since she last saw Dr. Lebron Conners.   She had a PE two years ago and is no longer taking blood thinners. She has lupus and takes Plaquenil, and sees Dr. Sabino Niemann in rheumatology. She notes that she has fairly controlled diabetes and is followed by her PCP Dr.Edwin Avbuere regarding this. She also sees Dr. Quay Burow in cardiology. She also sees Dr. Basil Dess in orthopedics. She also sees [facility]. The pt also notes that she has gout attacks in her toes, for which she takes Allopurinol.   Of note since the patient's last visit, pt has had PET/CT completed on 05/18/17 with results revealing No FDG PET-CT evidence of active multiple myeloma.  Lab results (11/07/17) of CBC w/diff, CMP, and Reticulocytes is as follows: all values are WNL except for RBC at 3.52, HGB at 9.6, HCT at 30.2, RDW at 17.0, Glucose at 337, BUN at 30, Creatinine at 1.71, AST at 14, GFR at 32. MMP 11/07/17 revealed all values WNL except for Alpha2 Glob Ser at 1.1, M Protein at 0.7, Globulin total at 4.0.  Kappa/Lambda 11/07/17 revealed all values WNL except for Kappa at 39.2. Beta-2 microglobulin 11/07/17 was at 4.5 LDH 11/07/17 was at 266  On review of systems, pt reports stable energy levels, occasional gout attacks in her toes, leg swelling,  open wounds on left leg, and denies dizziness, light headedness, abdominal pains, new bone pains, and any other symptoms.   Interval History:   [name] [name] returns today for management and evaluation of her MGUS. The patient's last visit with [place] was on 11/13/17. She is accompanied today by a friend. The pt reports that she is doing well overall.  The pt will be receiving an IV Injectafer infusion on 04/14/18.   The pt reports that she has had some recent headaches. She notes that her leg ulcers have been healing. The pt is taking Plaquenil three times a day for successful management of her lupus symptoms. She has noticed that she has lost a little hair since beginning the medications.  Lab results today (04/10/18) of CBC w/diff and CMP is as follows: all values are WNL except for HGB at 10.3, HCT at 33.3, MCH at 25.8, RDW at 19.9, Potassium at 5.3, Chloride at 112, CO2 at 21, Glucose at 162, BUN at 29, Creatinine at 2.05, GFR at 27.  04/10/18 MMP is pending 04/10/18 Ferritin is pending 04/10/18 Iron and TIBC is pending  On review of systems, pt reports stable energy levels, improved lupus symptoms, healing leg ulcers, and denies abdominal pains, new bone pains, and any other symptoms.   MEDICAL HISTORY:  Past Medical History:  Diagnosis Date  . Allergic rhinitis   . Anxiety   . Arthritis   . Asthma   . Cancer [facility]) 2006   breast cancer  right  . CHF (congestive heart failure) ([place])   . COPD (chronic obstructive pulmonary disease) ([place])   . Degenerative disc disease, lumbar   . Diabetes mellitus without complication ([place])   . Dyspnea    walking distances  . Eczema   . GERD (gastroesophageal reflux disease)   . Glaucoma   . History of home oxygen therapy    uses 2 liters ay night and prn  . Hyperlipidemia   . Hypertension   . Low back pain   . Lupus ([place])    skin  . Neck pain   . Numbness and tingling   . Obesity   . Osteopenia   . Pulmonary embolism ([place]) 01/2012    CT  showed multi small PE and coumadized   . Sleep apnea 2010   no cpap used  . Systemic lupus erythematosus ([place])     SURGICAL HISTORY: Past Surgical History:  Procedure Laterality Date  . ABDOMINAL HYSTERECTOMY  1976  . BACK SURGERY  2006   lower  . BREAST BIOPSY Right   . BREAST EXCISIONAL BIOPSY Left   . BREAST LUMPECTOMY Right   . COLONOSCOPY WITH PROPOFOL N/A 03/23/2015   Procedure: COLONOSCOPY WITH PROPOFOL;  Surgeon: Laurence Spates, MD;  Location: [facility];  Service: Endoscopy;  Laterality: N/A;  . Dobtamine myoview  05/02/2008   EF 67% ; LV norm  . DOPPLER ECHOCARDIOGRAPHY  01/25/2012   EF 55 TO 60%; LV norm.  . EXPLORATORY LAPAROTOMY    . EYE SURGERY Bilateral 2010   lens reaplcments for cataracts   . Lower Extrem. venous doppler  01/25/2012    neg.  . TOE SURGERY  1996   Bunion    SOCIAL HISTORY: Social History   Socioeconomic History  . Marital status: Single    Spouse name: Not on file  . Number of children: 5  . Years of education: 65  . Highest education level: Not on file  Occupational History  . Occupation: Disabled  Social Needs  . Financial resource strain: Not on file  . Food insecurity:    Worry: Not on file    Inability: Not on file  . Transportation needs:    Medical: Not on file    Non-medical: Not on file  Tobacco Use  . Smoking status: Former Research scientist (life sciences)  . Smokeless tobacco: Never Used  . Tobacco comment: Quit 1980  Substance and Sexual Activity  . Alcohol use: No    Alcohol/week: 0.0 standard drinks  . Drug use: No  . Sexual activity: Not on file  Lifestyle  . Physical activity:    Days per week: Not on file    Minutes per session: Not on file  . Stress: Not on file  Relationships  . Social connections:    Talks on phone: Not on file    Gets together: Not on file    Attends religious service: Not on file    Active member of club or organization: Not on file    Attends meetings of clubs or organizations: Not on file     Relationship status: Not on file  . Intimate partner violence:    Fear of current or ex partner: Not on file    Emotionally abused: Not on file    Physically abused: Not on file    Forced sexual activity: Not on file  Other Topics Concern  . Not on file  Social History Narrative   Lives at home alone.   Right-handed.   2-4  cups caffeine daily.    FAMILY HISTORY: Family History  Problem Relation Age of Onset  . Heart failure Father   . Heart failure Mother   . Diabetes Brother   . Breast cancer Maternal Aunt   . Breast cancer Paternal Aunt     ALLERGIES:  is allergic to penicillins; fentanyl; peach [prunus persica]; and shellfish allergy.  MEDICATIONS:  Current Outpatient Medications  Medication Sig Dispense Refill  . acetaminophen (TYLENOL) 650 MG CR tablet Take 1,300 mg by mouth every 8 (eight) hours as needed for pain.    Marland Kitchen albuterol (PROVENTIL HFA;VENTOLIN HFA) 108 (90 BASE) MCG/ACT inhaler Inhale 2 puffs into the lungs See admin instructions. Every 6 hours as needed for SOB but also takes 2 puffs BID scheduled.    Marland Kitchen albuterol (PROVENTIL) (2.5 MG/3ML) 0.083% nebulizer solution Take 2.5 mg by nebulization every 6 (six) hours as needed for wheezing or shortness of breath.     Marland Kitchen alendronate (FOSAMAX) 70 MG tablet Take 70 mg by mouth every Monday. Take with a full glass of water on an empty stomach.    Marland Kitchen allopurinol (ZYLOPRIM) 100 MG tablet Take 100 mg by mouth daily.    Marland Kitchen aspirin 325 MG tablet Take 325 mg by mouth daily.    Marland Kitchen atenolol (TENORMIN) 100 MG tablet Take 100 mg by mouth daily.     . budesonide (PULMICORT) 0.5 MG/2ML nebulizer solution Take 0.5 mg by nebulization every 6 (six) hours as needed (For wheezing.).     Marland Kitchen calcium carbonate (OS-CAL) 1250 (500 Ca) MG chewable tablet Chew 1 tablet by mouth daily.    . cetirizine (ZYRTEC) 10 MG tablet Take 10 mg by mouth daily.    . cholecalciferol (VITAMIN D) 1000 UNITS tablet Take 1,000 Units by mouth daily.    . citalopram  (CELEXA) 10 MG tablet Take 10 mg by mouth daily.     . clobetasol ointment (TEMOVATE) 6.38 % Apply 1 application topically 2 (two) times daily.     Marland Kitchen desonide (DESOWEN) 0.05 % cream Apply 1 application topically 2 (two) times daily.     Marland Kitchen dexlansoprazole (DEXILANT) 60 MG capsule Take 60 mg by mouth daily.     . diclofenac sodium (VOLTAREN) 1 % GEL Apply 2 g topically daily. Reported on 09/15/2015    . diltiazem (CARTIA XT) 300 MG 24 hr capsule Take 300 mg by mouth daily. Reported on 09/15/2015    . diphenhydrAMINE (BENADRYL) 25 MG tablet Take 25 mg by mouth every 6 (six) hours as needed for itching.     . furosemide (LASIX) 40 MG tablet Take 40 mg by mouth 2 (two) times daily.     Marland Kitchen gabapentin (NEURONTIN) 300 MG capsule Take 1 capsule (300 mg total) by mouth 2 (two) times daily. 60 capsule 6  . hydrALAZINE (APRESOLINE) 25 MG tablet Take 25 mg by mouth 3 (three) times daily.    . hydroxychloroquine (PLAQUENIL) 200 MG tablet Take 200 mg by mouth daily.     . insulin aspart (NOVOLOG) 100 UNIT/ML injection Inject 10 Units into the skin 3 (three) times daily before meals.     . insulin glargine (LANTUS) 100 UNIT/ML injection Inject 40-50 Units into the skin 2 (two) times daily. She takes 50 units in the morning and 40 units at bedtime.    . isosorbide mononitrate (IMDUR) 30 MG 24 hr tablet Take 30 mg by mouth every morning.    Marland Kitchen ketotifen (ALAWAY) 0.025 % ophthalmic solution Place 1 drop into  both eyes daily.     Marland Kitchen lisinopril-hydrochlorothiazide (PRINZIDE,ZESTORETIC) 20-12.5 MG per tablet Take 1 tablet by mouth daily.    Marland Kitchen LORazepam (ATIVAN) 0.5 MG tablet Take 1 tablet (0.5 mg total) by mouth every 4 (four) hours as needed for anxiety (For anxiety with PET-CT or MRI imaging). 25 tablet 0  . OXYGEN Inhale into the lungs. 2L/min - prn during day and every evening.    . pravastatin (PRAVACHOL) 40 MG tablet Take 40 mg by mouth at bedtime.     . SYMBICORT 160-4.5 MCG/ACT inhaler Inhale 2 puffs into the lungs  2 (two) times daily. Reported on 09/15/2015    . tizanidine (ZANAFLEX) 2 MG capsule Take 2 mg by mouth 2 (two) times daily.    . traMADol (ULTRAM) 50 MG tablet Take 1 tablet (50 mg total) by mouth every 6 (six) hours as needed for severe pain. 60 tablet 0  . vitamin B-12 (CYANOCOBALAMIN) 1000 MCG tablet Take 1,000 mcg by mouth daily. Reported on 09/15/2015     No current facility-administered medications for this visit.     REVIEW OF SYSTEMS:    A 10+ POINT REVIEW OF SYSTEMS WAS OBTAINED including neurology, dermatology, psychiatry, cardiac, respiratory, lymph, extremities, GI, GU, Musculoskeletal, constitutional, breasts, reproductive, HEENT.  All pertinent positives are noted in the HPI.  All others are negative.   PHYSICAL EXAMINATION: ECOG PERFORMANCE STATUS: 2 - Symptomatic, <50% confined to bed  . Vitals:   04/10/18 1409  BP: (!) 159/71  Pulse: 90  Resp: 20  Temp: 98.7 F (37.1 C)  SpO2: 96%   Filed Weights   04/10/18 1409  Weight: 218 lb 14.4 oz (99.3 kg)   .Body mass index is 37.57 kg/m.  GENERAL:alert, in no acute distress and comfortable SKIN: no acute rashes, no significant lesions EYES: conjunctiva are pink and non-injected, sclera anicteric OROPHARYNX: MMM, no exudates, no oropharyngeal erythema or ulceration NECK: supple, no JVD LYMPH:  no palpable lymphadenopathy in the cervical, axillary or inguinal regions LUNGS: clear to auscultation b/l with normal respiratory effort HEART: regular rate & rhythm ABDOMEN:  normoactive bowel sounds , non tender, not distended. No palpable hepatosplenomegaly.  Extremity: Right 1+ pedal edema, left 2 or 3+ pedal edema with compression socks  PSYCH: alert & oriented x 3 with fluent speech NEURO: no focal motor/sensory deficits   LABORATORY DATA:  I have reviewed the data as listed  . CBC Latest Ref Rng & Units 04/10/2018 11/07/2017 08/08/2017  WBC 4.0 - 10.5 K/uL 7.5 7.7 7.8  Hemoglobin 12.0 - 15.0 g/dL 10.3(L) 9.6(L)  10.3(L)  Hematocrit 36.0 - 46.0 % 33.3(L) 30.2(L) 32.4(L)  Platelets 150 - 400 K/uL 279 341 292   . CBC    Component Value Date/Time   WBC 7.5 04/10/2018 1347   RBC 3.99 04/10/2018 1347   HGB 10.3 (L) 04/10/2018 1347   HGB 9.6 (L) 11/07/2017 1009   HGB 10.9 (L) 03/15/2017 1202   HCT 33.3 (L) 04/10/2018 1347   HCT 33.1 (L) 03/15/2017 1202   PLT 279 04/10/2018 1347   PLT 341 11/07/2017 1009   PLT 316 03/15/2017 1202   MCV 83.5 04/10/2018 1347   MCV 86.4 03/15/2017 1202   MCH 25.8 (L) 04/10/2018 1347   MCHC 30.9 04/10/2018 1347   RDW 19.9 (H) 04/10/2018 1347   RDW 15.0 (H) 03/15/2017 1202   LYMPHSABS 1.4 04/10/2018 1347   LYMPHSABS 1.4 03/15/2017 1202   MONOABS 0.4 04/10/2018 1347   MONOABS 0.5 03/15/2017 1202  EOSABS 0.3 04/10/2018 1347   EOSABS 0.0 03/15/2017 1202   BASOSABS 0.0 04/10/2018 1347   BASOSABS 0.1 03/15/2017 1202    CMP Latest Ref Rng & Units 04/10/2018 11/07/2017 08/08/2017  Glucose 70 - 99 mg/dL 162(H) 337(H) 389(H)  BUN 8 - 23 mg/dL 29(H) 30(H) 30(H)  Creatinine 0.44 - 1.00 mg/dL 2.05(H) 1.71(H) 1.95(H)  Sodium 135 - 145 mmol/L 142 141 139  Potassium 3.5 - 5.1 mmol/L 5.3(H) 4.0 4.7  Chloride 98 - 111 mmol/L 112(H) 106 105  CO2 22 - 32 mmol/L 21(L) 26 21(L)  Calcium 8.9 - 10.3 mg/dL 10.2 10.2 10.5(H)  Total Protein 6.5 - 8.1 g/dL 7.2 7.5 7.7  Total Bilirubin 0.3 - 1.2 mg/dL 0.3 0.3 0.3  Alkaline Phos 38 - 126 U/L 72 90 84  AST 15 - 41 U/L 17 14(L) 18  ALT 0 - 44 U/L _0 04/06/17 BM Bx:   04/04/17 Cytogenetics:    RADIOGRAPHIC STUDIES: I have personally reviewed the radiological images as listed and agreed with the findings in the report. No results found.  ASSESSMENT & PLAN:   77 y.o. female with lupus, CKD, Diabetes Mellitus type II, gout and   1. MGUS (no smoldering myeloma despite > 10% plasma cells in the BM Bx since a large extent of those appeared to be reactive) -Discussed pt labwork from 11/07/17; HGB at 9.6, M Protein at 0.7  (no significant change from previous M spike of 0.6)  2. Anemia due to CKD? And anemia of chronic inflammation related to lupus/leg ulcers.  PLAN:  -Discussed pt labwork today, 04/10/18; HGB slightly increased to 10.3 -04/10/18 MMP- M spike down from 0.7 to 0.5 IgG Lambda -04/10/18 Ferritin is 24 -Proceed with IV Injectafer on 04/14/18 and 04/21/17 -Suspect part of increase in plasma cells to be of lupus etiology, CKD and her open wounds -continue to optimize mx of lupus. -maintain ferritin >100 -if ferritin > 100 and lupus rx optimized and hgb <9 without progression of plasma cell dyscrasia - then would need to consider EPO -Will see the pt back in 4 months   3. Patient Active Problem List   Diagnosis Date Noted  . Iron deficiency anemia 04/10/2018  . 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 Abnormality of gait 08/05/2015  . Spinal stenosis of lumbar region 08/05/2015  . Peripheral neuropathy 08/05/2015  . Chest pain 12/28/2013  . Hyperlipidemia 04/05/2013  . Systemic lupus erythematosus ([place]) 04/05/2013  . Pulmonary embolism ([place]) 01/24/2012  . Chronic diastolic heart failure ([place]) 01/24/2012  . COPD (chronic obstructive pulmonary disease) ([place]) 01/24/2012  . Diabetes mellitus ([place]) 01/24/2012  . Hypertension 01/24/2012  . Leukocytosis 01/24/2012  . Bronchitis 01/24/2012  . Anemia 01/24/2012   -continue mx of other co-morbids with PCP   F/u for IV Iron infusion as scheduled RTC with Dr Irene Limbo with labs in 4 months   All of the patients questions were answered with apparent satisfaction. The patient knows to call the clinic with any problems, questions or concerns.  The total time spent in the appt was 25 minutes and more than 50% was on counseling and direct patient cares.    Sullivan Lone MD MS AAHIVMS [facility] Hematology/Oncology Physician [facility]  (Office):       [phone] (Work cell):  [phone] (Fax):            [phone]  04/10/2018 2:28 PM  I, Baldwin [place], am acting as a scribe for Dr. Suzan Slick  [facility].   .I have reviewed the above documentation for accuracy and completeness, and I agree with the above. Brunetta Genera MD

## 2018-04-10 ENCOUNTER — Inpatient Hospital Stay (HOSPITAL_BASED_OUTPATIENT_CLINIC_OR_DEPARTMENT_OTHER): Payer: Medicare HMO | Admitting: Hematology

## 2018-04-10 ENCOUNTER — Inpatient Hospital Stay: Payer: Medicare HMO

## 2018-04-10 ENCOUNTER — Telehealth: Payer: Self-pay

## 2018-04-10 ENCOUNTER — Other Ambulatory Visit: Payer: Self-pay | Admitting: Hematology

## 2018-04-10 VITALS — BP 159/71 | HR 90 | Temp 98.7°F | Resp 20 | Ht 64.0 in | Wt 218.9 lb

## 2018-04-10 DIAGNOSIS — D509 Iron deficiency anemia, unspecified: Secondary | ICD-10-CM | POA: Insufficient documentation

## 2018-04-10 DIAGNOSIS — N189 Chronic kidney disease, unspecified: Secondary | ICD-10-CM | POA: Diagnosis not present

## 2018-04-10 DIAGNOSIS — D649 Anemia, unspecified: Secondary | ICD-10-CM

## 2018-04-10 DIAGNOSIS — M329 Systemic lupus erythematosus, unspecified: Secondary | ICD-10-CM

## 2018-04-10 DIAGNOSIS — L97909 Non-pressure chronic ulcer of unspecified part of unspecified lower leg with unspecified severity: Secondary | ICD-10-CM

## 2018-04-10 DIAGNOSIS — C9000 Multiple myeloma not having achieved remission: Secondary | ICD-10-CM

## 2018-04-10 DIAGNOSIS — D472 Monoclonal gammopathy: Secondary | ICD-10-CM

## 2018-04-10 DIAGNOSIS — D6489 Other specified anemias: Secondary | ICD-10-CM

## 2018-04-10 LAB — CBC WITH DIFFERENTIAL/PLATELET
Abs Immature Granulocytes: 0.03 10*3/uL (ref 0.00–0.07)
Basophils Absolute: 0 10*3/uL (ref 0.0–0.1)
Basophils Relative: 0 %
Eosinophils Absolute: 0.3 10*3/uL (ref 0.0–0.5)
Eosinophils Relative: 4 %
HEMATOCRIT: 33.3 % — AB (ref 36.0–46.0)
Hemoglobin: 10.3 g/dL — ABNORMAL LOW (ref 12.0–15.0)
Immature Granulocytes: 0 %
Lymphocytes Relative: 18 %
Lymphs Abs: 1.4 10*3/uL (ref 0.7–4.0)
MCH: 25.8 pg — ABNORMAL LOW (ref 26.0–34.0)
MCHC: 30.9 g/dL (ref 30.0–36.0)
MCV: 83.5 fL (ref 80.0–100.0)
Monocytes Absolute: 0.4 10*3/uL (ref 0.1–1.0)
Monocytes Relative: 6 %
Neutro Abs: 5.4 10*3/uL (ref 1.7–7.7)
Neutrophils Relative %: 72 %
Platelets: 279 10*3/uL (ref 150–400)
RBC: 3.99 MIL/uL (ref 3.87–5.11)
RDW: 19.9 % — ABNORMAL HIGH (ref 11.5–15.5)
WBC: 7.5 10*3/uL (ref 4.0–10.5)
nRBC: 0 % (ref 0.0–0.2)

## 2018-04-10 LAB — IRON AND TIBC
Iron: 57 ug/dL (ref 41–142)
Saturation Ratios: 21 % (ref 21–57)
TIBC: 276 ug/dL (ref 236–444)
UIBC: 219 ug/dL (ref 120–384)

## 2018-04-10 LAB — CMP (CANCER CENTER ONLY)
ALBUMIN: 3.6 g/dL (ref 3.5–5.0)
ALT: 14 U/L (ref 0–44)
AST: 17 U/L (ref 15–41)
Alkaline Phosphatase: 72 U/L (ref 38–126)
Anion gap: 9 (ref 5–15)
BUN: 29 mg/dL — ABNORMAL HIGH (ref 8–23)
CO2: 21 mmol/L — ABNORMAL LOW (ref 22–32)
Calcium: 10.2 mg/dL (ref 8.9–10.3)
Chloride: 112 mmol/L — ABNORMAL HIGH (ref 98–111)
Creatinine: 2.05 mg/dL — ABNORMAL HIGH (ref 0.44–1.00)
GFR, Est AFR Am: 27 mL/min — ABNORMAL LOW (ref >60)
GFR, Estimated: 23 mL/min — ABNORMAL LOW (ref >60)
GLUCOSE: 162 mg/dL — AB (ref 70–99)
Potassium: 5.3 mmol/L — ABNORMAL HIGH (ref 3.5–5.1)
Sodium: 142 mmol/L (ref 135–145)
Total Bilirubin: 0.3 mg/dL (ref 0.3–1.2)
Total Protein: 7.2 g/dL (ref 6.5–8.1)

## 2018-04-10 LAB — FERRITIN: FERRITIN: 24 ng/mL (ref 11–307)

## 2018-04-10 NOTE — Telephone Encounter (Signed)
Printed avs and calender of upcoming appointment. Per 12/24 los 

## 2018-04-13 LAB — MULTIPLE MYELOMA PANEL, SERUM
ALBUMIN/GLOB SERPL: 1.1 (ref 0.7–1.7)
Albumin SerPl Elph-Mcnc: 3.4 g/dL (ref 2.9–4.4)
Alpha 1: 0.2 g/dL (ref 0.0–0.4)
Alpha2 Glob SerPl Elph-Mcnc: 0.9 g/dL (ref 0.4–1.0)
B-Globulin SerPl Elph-Mcnc: 1 g/dL (ref 0.7–1.3)
Gamma Glob SerPl Elph-Mcnc: 1.2 g/dL (ref 0.4–1.8)
Globulin, Total: 3.3 g/dL (ref 2.2–3.9)
IgA: 153 mg/dL (ref 64–422)
IgG (Immunoglobin G), Serum: 1365 mg/dL (ref 700–1600)
IgM (Immunoglobulin M), Srm: 50 mg/dL (ref 26–217)
M Protein SerPl Elph-Mcnc: 0.5 g/dL — ABNORMAL HIGH
Total Protein ELP: 6.7 g/dL (ref 6.0–8.5)

## 2018-04-14 ENCOUNTER — Inpatient Hospital Stay: Payer: Medicare HMO

## 2018-04-14 VITALS — BP 138/60 | HR 79 | Temp 99.1°F | Resp 18

## 2018-04-14 DIAGNOSIS — L97909 Non-pressure chronic ulcer of unspecified part of unspecified lower leg with unspecified severity: Secondary | ICD-10-CM | POA: Diagnosis not present

## 2018-04-14 DIAGNOSIS — N189 Chronic kidney disease, unspecified: Secondary | ICD-10-CM | POA: Diagnosis not present

## 2018-04-14 DIAGNOSIS — M329 Systemic lupus erythematosus, unspecified: Secondary | ICD-10-CM | POA: Diagnosis not present

## 2018-04-14 DIAGNOSIS — D509 Iron deficiency anemia, unspecified: Secondary | ICD-10-CM

## 2018-04-14 DIAGNOSIS — D6489 Other specified anemias: Secondary | ICD-10-CM | POA: Diagnosis not present

## 2018-04-14 DIAGNOSIS — D472 Monoclonal gammopathy: Secondary | ICD-10-CM | POA: Diagnosis not present

## 2018-04-14 MED ORDER — SODIUM CHLORIDE 0.9 % IV SOLN
Freq: Once | INTRAVENOUS | Status: AC
  Administered 2018-04-14: 11:00:00 via INTRAVENOUS
  Filled 2018-04-14: qty 250

## 2018-04-14 MED ORDER — SODIUM CHLORIDE 0.9 % IV SOLN
750.0000 mg | Freq: Once | INTRAVENOUS | Status: AC
  Administered 2018-04-14: 750 mg via INTRAVENOUS
  Filled 2018-04-14: qty 15

## 2018-04-14 NOTE — Patient Instructions (Signed)

## 2018-04-16 DIAGNOSIS — M1712 Unilateral primary osteoarthritis, left knee: Secondary | ICD-10-CM | POA: Diagnosis not present

## 2018-04-16 DIAGNOSIS — M25562 Pain in left knee: Secondary | ICD-10-CM | POA: Diagnosis not present

## 2018-04-20 ENCOUNTER — Telehealth: Payer: Self-pay | Admitting: Hematology

## 2018-04-20 NOTE — Telephone Encounter (Signed)
Faxed medical records to [facility] [address], Release [phone]

## 2018-04-21 ENCOUNTER — Inpatient Hospital Stay: Payer: Medicare HMO | Attending: Internal Medicine

## 2018-04-21 DIAGNOSIS — D509 Iron deficiency anemia, unspecified: Secondary | ICD-10-CM

## 2018-04-21 DIAGNOSIS — D6489 Other specified anemias: Secondary | ICD-10-CM | POA: Insufficient documentation

## 2018-04-21 DIAGNOSIS — N189 Chronic kidney disease, unspecified: Secondary | ICD-10-CM | POA: Diagnosis not present

## 2018-04-21 DIAGNOSIS — D472 Monoclonal gammopathy: Secondary | ICD-10-CM | POA: Insufficient documentation

## 2018-04-21 MED ORDER — SODIUM CHLORIDE 0.9 % IV SOLN
Freq: Once | INTRAVENOUS | Status: AC
  Administered 2018-04-21: 10:00:00 via INTRAVENOUS
  Filled 2018-04-21: qty 250

## 2018-04-21 MED ORDER — SODIUM CHLORIDE 0.9 % IV SOLN
750.0000 mg | Freq: Once | INTRAVENOUS | Status: AC
  Administered 2018-04-21: 750 mg via INTRAVENOUS
  Filled 2018-04-21: qty 15

## 2018-04-21 NOTE — Patient Instructions (Signed)

## 2018-05-01 ENCOUNTER — Other Ambulatory Visit: Payer: Self-pay | Admitting: Hematology

## 2018-05-06 DIAGNOSIS — J449 Chronic obstructive pulmonary disease, unspecified: Secondary | ICD-10-CM | POA: Diagnosis not present

## 2018-05-08 DIAGNOSIS — E1140 Type 2 diabetes mellitus with diabetic neuropathy, unspecified: Secondary | ICD-10-CM | POA: Diagnosis not present

## 2018-05-08 DIAGNOSIS — J4520 Mild intermittent asthma, uncomplicated: Secondary | ICD-10-CM | POA: Diagnosis not present

## 2018-05-08 DIAGNOSIS — M179 Osteoarthritis of knee, unspecified: Secondary | ICD-10-CM | POA: Diagnosis not present

## 2018-05-08 DIAGNOSIS — I10 Essential (primary) hypertension: Secondary | ICD-10-CM | POA: Diagnosis not present

## 2018-05-08 DIAGNOSIS — Z0000 Encounter for general adult medical examination without abnormal findings: Secondary | ICD-10-CM | POA: Diagnosis not present

## 2018-05-08 DIAGNOSIS — M1A9XX0 Chronic gout, unspecified, without tophus (tophi): Secondary | ICD-10-CM | POA: Diagnosis not present

## 2018-05-16 DIAGNOSIS — M79672 Pain in left foot: Secondary | ICD-10-CM | POA: Diagnosis not present

## 2018-05-16 DIAGNOSIS — M205X2 Other deformities of toe(s) (acquired), left foot: Secondary | ICD-10-CM | POA: Diagnosis not present

## 2018-05-16 DIAGNOSIS — S90112A Contusion of left great toe without damage to nail, initial encounter: Secondary | ICD-10-CM | POA: Diagnosis not present

## 2018-05-16 DIAGNOSIS — B351 Tinea unguium: Secondary | ICD-10-CM | POA: Diagnosis not present

## 2018-05-16 DIAGNOSIS — L84 Corns and callosities: Secondary | ICD-10-CM | POA: Diagnosis not present

## 2018-05-16 DIAGNOSIS — E1351 Other specified diabetes mellitus with diabetic peripheral angiopathy without gangrene: Secondary | ICD-10-CM | POA: Diagnosis not present

## 2018-05-19 DIAGNOSIS — J449 Chronic obstructive pulmonary disease, unspecified: Secondary | ICD-10-CM | POA: Diagnosis not present

## 2018-05-24 ENCOUNTER — Other Ambulatory Visit: Payer: Self-pay | Admitting: Internal Medicine

## 2018-05-24 DIAGNOSIS — Z1231 Encounter for screening mammogram for malignant neoplasm of breast: Secondary | ICD-10-CM

## 2018-06-06 DIAGNOSIS — N184 Chronic kidney disease, stage 4 (severe): Secondary | ICD-10-CM | POA: Diagnosis not present

## 2018-06-06 DIAGNOSIS — E1122 Type 2 diabetes mellitus with diabetic chronic kidney disease: Secondary | ICD-10-CM | POA: Diagnosis not present

## 2018-06-06 DIAGNOSIS — D631 Anemia in chronic kidney disease: Secondary | ICD-10-CM | POA: Diagnosis not present

## 2018-06-06 DIAGNOSIS — D472 Monoclonal gammopathy: Secondary | ICD-10-CM | POA: Diagnosis not present

## 2018-06-06 DIAGNOSIS — I129 Hypertensive chronic kidney disease with stage 1 through stage 4 chronic kidney disease, or unspecified chronic kidney disease: Secondary | ICD-10-CM | POA: Diagnosis not present

## 2018-06-06 DIAGNOSIS — J449 Chronic obstructive pulmonary disease, unspecified: Secondary | ICD-10-CM | POA: Diagnosis not present

## 2018-06-06 DIAGNOSIS — N2581 Secondary hyperparathyroidism of renal origin: Secondary | ICD-10-CM | POA: Diagnosis not present

## 2018-06-06 DIAGNOSIS — Z86711 Personal history of pulmonary embolism: Secondary | ICD-10-CM | POA: Diagnosis not present

## 2018-06-06 DIAGNOSIS — N189 Chronic kidney disease, unspecified: Secondary | ICD-10-CM | POA: Diagnosis not present

## 2018-06-06 DIAGNOSIS — I5032 Chronic diastolic (congestive) heart failure: Secondary | ICD-10-CM | POA: Diagnosis not present

## 2018-06-06 DIAGNOSIS — M329 Systemic lupus erythematosus, unspecified: Secondary | ICD-10-CM | POA: Diagnosis not present

## 2018-06-28 DIAGNOSIS — N3946 Mixed incontinence: Secondary | ICD-10-CM | POA: Diagnosis not present

## 2018-06-28 DIAGNOSIS — R351 Nocturia: Secondary | ICD-10-CM | POA: Diagnosis not present

## 2018-07-02 ENCOUNTER — Other Ambulatory Visit: Payer: Self-pay

## 2018-07-02 ENCOUNTER — Ambulatory Visit
Admission: RE | Admit: 2018-07-02 | Discharge: 2018-07-02 | Disposition: A | Discharge status: Home / Self Care | Payer: Medicare HMO | Source: Ambulatory Visit | Attending: Internal Medicine | Admitting: Internal Medicine

## 2018-07-02 DIAGNOSIS — Z1231 Encounter for screening mammogram for malignant neoplasm of breast: Secondary | ICD-10-CM

## 2018-07-05 ENCOUNTER — Ambulatory Visit (INDEPENDENT_AMBULATORY_CARE_PROVIDER_SITE_OTHER): Payer: Medicaid Other | Admitting: Specialist

## 2018-07-05 DIAGNOSIS — J449 Chronic obstructive pulmonary disease, unspecified: Secondary | ICD-10-CM | POA: Diagnosis not present

## 2018-07-06 ENCOUNTER — Telehealth (INDEPENDENT_AMBULATORY_CARE_PROVIDER_SITE_OTHER): Payer: Self-pay

## 2018-07-06 NOTE — Telephone Encounter (Signed)
Talked with patient confirming appointment for Monday, 07/09/2018 and patient answered NO to all COVID-19 screening questions.

## 2018-07-09 ENCOUNTER — Ambulatory Visit (INDEPENDENT_AMBULATORY_CARE_PROVIDER_SITE_OTHER): Payer: Medicaid Other | Admitting: Specialist

## 2018-08-02 DIAGNOSIS — H6692 Otitis media, unspecified, left ear: Secondary | ICD-10-CM | POA: Diagnosis not present

## 2018-08-02 DIAGNOSIS — E1140 Type 2 diabetes mellitus with diabetic neuropathy, unspecified: Secondary | ICD-10-CM | POA: Diagnosis not present

## 2018-08-02 DIAGNOSIS — I10 Essential (primary) hypertension: Secondary | ICD-10-CM | POA: Diagnosis not present

## 2018-08-02 DIAGNOSIS — J4520 Mild intermittent asthma, uncomplicated: Secondary | ICD-10-CM | POA: Diagnosis not present

## 2018-08-02 DIAGNOSIS — I509 Heart failure, unspecified: Secondary | ICD-10-CM | POA: Diagnosis not present

## 2018-08-05 DIAGNOSIS — J449 Chronic obstructive pulmonary disease, unspecified: Secondary | ICD-10-CM | POA: Diagnosis not present

## 2018-08-07 ENCOUNTER — Inpatient Hospital Stay: Payer: Medicare HMO | Admitting: Hematology

## 2018-08-07 ENCOUNTER — Telehealth: Payer: Self-pay | Admitting: Hematology

## 2018-08-07 ENCOUNTER — Ambulatory Visit: Payer: Medicare HMO | Admitting: Hematology

## 2018-08-07 ENCOUNTER — Telehealth: Payer: Self-pay | Admitting: *Deleted

## 2018-08-07 ENCOUNTER — Other Ambulatory Visit: Payer: Medicare HMO

## 2018-08-07 ENCOUNTER — Inpatient Hospital Stay: Payer: Medicare HMO

## 2018-08-07 NOTE — Telephone Encounter (Signed)
Called to confirm that patient wants to cancel appt today 4/21 at 12n and reschedule for another time when her aide can come with her. Patient confirms this is what she wants to do. Advised that appt for 4/21 will be cancelled and that scheduling will contact her. Patient verbalized understanding.

## 2018-08-07 NOTE — Telephone Encounter (Signed)
Called per 4/21 sch message - unable to reach patient left message for patient to call back to reschedule appt.

## 2018-08-31 ENCOUNTER — Ambulatory Visit: Payer: Medicare HMO | Admitting: Specialist

## 2018-10-02 ENCOUNTER — Telehealth (INDEPENDENT_AMBULATORY_CARE_PROVIDER_SITE_OTHER): Payer: Medicare HMO | Admitting: Cardiovascular Disease

## 2018-10-02 ENCOUNTER — Telehealth: Payer: Self-pay

## 2018-10-02 DIAGNOSIS — J449 Chronic obstructive pulmonary disease, unspecified: Secondary | ICD-10-CM

## 2018-10-02 DIAGNOSIS — I5032 Chronic diastolic (congestive) heart failure: Secondary | ICD-10-CM | POA: Diagnosis not present

## 2018-10-02 DIAGNOSIS — E782 Mixed hyperlipidemia: Secondary | ICD-10-CM

## 2018-10-02 DIAGNOSIS — I10 Essential (primary) hypertension: Secondary | ICD-10-CM | POA: Diagnosis not present

## 2018-10-02 NOTE — Telephone Encounter (Signed)

## 2018-10-02 NOTE — Telephone Encounter (Signed)
Patient and/or DPR-approved person aware of 6/16 AVS instructions and verbalized understanding.  Letter including After Visit Summary and any other necessary documents to be mailed to the patient's address on file.  

## 2018-10-02 NOTE — Patient Instructions (Signed)

## 2018-10-02 NOTE — Progress Notes (Signed)
Virtual Visit via Video Note   This visit type was conducted due to national recommendations for restrictions regarding the COVID-19 Pandemic (e.g. social distancing) in an effort to limit this patient's exposure and mitigate transmission in our community.  Due to her co-morbid illnesses, this patient is at least at moderate risk for complications without adequate follow up.  This format is felt to be most appropriate for this patient at this time.  All issues noted in this document were discussed and addressed.  A limited physical exam was performed with this format.  Please refer to the patient's chart for her consent to telehealth for [facility].   Date:  10/02/2018   ID:  [name] [name], DOB [birth year], MRN [id number]  Patient Location: Home Provider Location: Home  PCP:  Nolene Ebbs, MD  Cardiologist: Dr. Quay Burow Electrophysiologist:  None   Evaluation Performed:  Follow-Up Visit  Chief Complaint: Follow-up hypertension and diastolic heart failure  History of Present Illness:    [name] [name] is a 78 y.o.  severely overweight widowed [place] American female, mother of 1 and grandmother to 2 grandchildren, whom I last saw  09/29/2017.Marland Kitchen She has a history of congestive heart failure probably related to diastolic dysfunction with normal LV function. She had moderate concentric LVH and grade 1 diastolic dysfunction by echo January 25, 2012. Her other problems include hypertension, non-insulin-dependent diabetes, hyperlipidemia, and obstructive sleep apnea. She had a negative Myoview May 02, 2008. She was admitted October 8-13 of this year with shortness of breath. The CT angiogram showed multiple small pulmonary emboli and she was coumadinized. Her lower extremity venous Dopplers were negative. Her primary care physician has since stopped her Coumadin anticoagulation. Since I saw her a year ago she's been asymptomatic other than chronic shortness of breath.  She is had  recurrent of her venous ulcers and lower extremity edema and is seen Dr. Dellia Nims at the wound care center.  Her legs are both wrapped.  She was on twice daily furosemide which resulted in worsening of renal function so currently she is on daily.  Her serum creatinine runs in the 2 range.  Since I saw the patient a year ago she is remained stable.  She does have COPD and gets short of breath on occasion.  She wears PRN O2.  She is on chronic diuretic therapy with some peripheral edema secondary to diastolic heart failure.  She avoids salt.  She denies chest pain.  Dr. Pierce Crane follows her lipid profile.  The patient does not have symptoms concerning for COVID-19 infection (fever, chills, cough, or new shortness of breath).    Past Medical History:  Diagnosis Date  . Allergic rhinitis   . Anxiety   . Arthritis   . Asthma   . Cancer [facility]) 2006   breast cancer right  . CHF (congestive heart failure) ([place])   . COPD (chronic obstructive pulmonary disease) ([place])   . Degenerative disc disease, lumbar   . Diabetes mellitus without complication ([place])   . Dyspnea    walking distances  . Eczema   . GERD (gastroesophageal reflux disease)   . Glaucoma   . History of home oxygen therapy    uses 2 liters ay night and prn  . Hyperlipidemia   . Hypertension   . Low back pain   . Lupus ([place])    skin  . Neck pain   . Numbness and tingling   . Obesity   . Osteopenia   .  Pulmonary embolism ([place]) 01/2012    CT showed multi small PE and coumadized   . Sleep apnea 2010   no cpap used  . Systemic lupus erythematosus ([place])    Past Surgical History:  Procedure Laterality Date  . ABDOMINAL HYSTERECTOMY  1976  . BACK SURGERY  2006   lower  . BREAST BIOPSY Right   . BREAST EXCISIONAL BIOPSY Left   . BREAST LUMPECTOMY Right   . COLONOSCOPY WITH PROPOFOL N/A 03/23/2015   Procedure: COLONOSCOPY WITH PROPOFOL;  Surgeon: Laurence Spates, MD;  Location: [facility] ENDOSCOPY;  Service: Endoscopy;  Laterality: N/A;   . Dobtamine myoview  05/02/2008   EF 67% ; LV norm  . DOPPLER ECHOCARDIOGRAPHY  01/25/2012   EF 55 TO 60%; LV norm.  . EXPLORATORY LAPAROTOMY    . EYE SURGERY Bilateral 2010   lens reaplcments for cataracts   . Lower Extrem. venous doppler  01/25/2012    neg.  . TOE SURGERY  1996   Bunion     No outpatient medications have been marked as taking for the 10/02/18 encounter (Appointment) with Lorretta Harp, MD.     Allergies:   Penicillins, Fentanyl, Peach [prunus persica], and Shellfish allergy   Social History   Tobacco Use  . Smoking status: Former Research scientist (life sciences)  . Smokeless tobacco: Never Used  . Tobacco comment: Quit 1980  Substance Use Topics  . Alcohol use: No    Alcohol/week: 0.0 standard drinks  . Drug use: No     Family Hx: The patient's family history includes Breast cancer in her maternal aunt and paternal aunt; Diabetes in her brother; Heart failure in her father and mother.  ROS:   Please see the history of present illness.     All other systems reviewed and are negative.   Prior CV studies:   The following studies were reviewed today:  None  Labs/Other Tests and Data Reviewed:    EKG:  No ECG reviewed.  Recent Labs: 04/10/2018: ALT 14; BUN 29; Creatinine 2.05; Hemoglobin 10.3; Platelets 279; Potassium 5.3; Sodium 142   Recent Lipid Panel No results found for: CHOL, TRIG, HDL, CHOLHDL, LDLCALC, LDLDIRECT  Wt Readings from Last 3 Encounters:  04/10/18 218 lb 14.4 oz (99.3 kg)  04/05/18 236 lb (107 kg)  01/10/18 236 lb (107 kg)     Objective:    Vital Signs:  There were no vitals taken for this visit.   VITAL SIGNS:  reviewed GEN:  no acute distress RESPIRATORY:  normal respiratory effort, symmetric expansion NEURO:  alert and oriented x 3, no obvious focal deficit PSYCH:  normal affect  ASSESSMENT & PLAN:    1. Essential hypertension- history of essential hypertension with blood pressure measured at home by the patient of 191/99.  She  says that when she visited her PCP recently it was normal.  She is on atenolol, lisinopril, hydralazine and hydrochlorothiazide.  She does avoid salt. 2. Hyperlipidemia-on pravastatin followed by her PCP 3. Diastolic heart failure- her last 2D echo performed 10/29/2015 revealed grade 1 diastolic dysfunction with a pulmonary artery pressure of 50 mmHg.  She is on twice daily furosemide.  COVID-19 Education: The signs and symptoms of COVID-19 were discussed with the patient and how to seek care for testing (follow up with PCP or arrange E-visit).  The importance of social distancing was discussed today.  Time:   Today, I have spent 8 minutes with the patient with telehealth technology discussing the above problems.  Medication Adjustments/Labs and Tests Ordered: Current medicines are reviewed at length with the patient today.  Concerns regarding medicines are outlined above.   Tests Ordered: No orders of the defined types were placed in this encounter.   Medication Changes: No orders of the defined types were placed in this encounter.   Follow Up:  In Person in 1 year(s)  Signed, Quay Burow, MD  10/02/2018 11:26 AM    [place]

## 2018-10-05 ENCOUNTER — Telehealth: Payer: Self-pay

## 2018-10-05 ENCOUNTER — Telehealth: Payer: Self-pay | Admitting: Hematology

## 2018-10-05 NOTE — Telephone Encounter (Signed)
Spoke with patient re 7/1 lab/fu.

## 2018-10-05 NOTE — Telephone Encounter (Signed)
Patient called and would like to reschedule lab and visit with Dr. Irene Limbo that she canceled back in April. Patient states she has new neck pain. Scheduling message sent and patient aware to expect a call from scheduling. Patient instructed to call the office with any further questions or concerns if needed before appointment date.

## 2018-10-09 ENCOUNTER — Telehealth: Payer: Self-pay | Admitting: Cardiovascular Disease

## 2018-10-09 ENCOUNTER — Encounter: Payer: Self-pay | Admitting: Specialist

## 2018-10-09 ENCOUNTER — Ambulatory Visit: Payer: Self-pay

## 2018-10-09 ENCOUNTER — Other Ambulatory Visit: Payer: Self-pay

## 2018-10-09 ENCOUNTER — Ambulatory Visit (INDEPENDENT_AMBULATORY_CARE_PROVIDER_SITE_OTHER): Payer: Medicare HMO | Admitting: Specialist

## 2018-10-09 VITALS — BP 165/79 | HR 126 | Ht 64.0 in | Wt 218.0 lb

## 2018-10-09 DIAGNOSIS — M48062 Spinal stenosis, lumbar region with neurogenic claudication: Secondary | ICD-10-CM | POA: Diagnosis not present

## 2018-10-09 DIAGNOSIS — M25512 Pain in left shoulder: Secondary | ICD-10-CM

## 2018-10-09 DIAGNOSIS — M19012 Primary osteoarthritis, left shoulder: Secondary | ICD-10-CM

## 2018-10-09 DIAGNOSIS — G5601 Carpal tunnel syndrome, right upper limb: Secondary | ICD-10-CM | POA: Diagnosis not present

## 2018-10-09 DIAGNOSIS — M4722 Other spondylosis with radiculopathy, cervical region: Secondary | ICD-10-CM

## 2018-10-09 DIAGNOSIS — R600 Localized edema: Secondary | ICD-10-CM

## 2018-10-09 MED ORDER — BUPIVACAINE HCL 0.25 % IJ SOLN
4.0000 mL | INTRAMUSCULAR | Status: AC | PRN
  Administered 2018-10-09: 4 mL via INTRA_ARTICULAR

## 2018-10-09 MED ORDER — METHYLPREDNISOLONE ACETATE 40 MG/ML IJ SUSP
40.0000 mg | INTRAMUSCULAR | Status: AC | PRN
  Administered 2018-10-09: 40 mg via INTRA_ARTICULAR

## 2018-10-09 NOTE — Patient Instructions (Addendum)
Elevated uric acid level indicates that some of the joint pain may be due to inflamation due to hyperuricemia or gout. Recommend antigout meds including colchicine and allopurinol and assessment of renal function. Patients with increased uric acid levels may have this due to poor kidney secretion of the uric acid into the urine due to  Decrease kidney function or due to increased levels of uric acid due to a hypermetablolic state that can be seen with occult tumor or increased protein intake. A thorough medical evaluation is recommended.  Avoid overhead lifting and overhead use of the arms. Pillows to keep from sleeping directly on the shoulders Limited lifting to less than 10 lbs. Ice or heat for relief. Stretching exercise help and strengthening is helpful to build endurance. Appointment at [facility] tomorrow at 3:15 PM, take a lasix tablet tonight per [facility] of cardiology.

## 2018-10-09 NOTE — Telephone Encounter (Signed)
Dr Stanford Breed spoke with dr Louanne Skye, the patient is in his office with edema and elevated heart rate of 120 bpm. He feels the patient is volume overloaded. Patient was advise to take an extra furosemide today and she will be seen by app tomorrow.

## 2018-10-09 NOTE — Progress Notes (Signed)
Office Visit Note   Patient: [name] [name]           Date of Birth: May 08, 1940           MRN: [id number] Visit Date: 10/09/2018              Requested by: Nolene Ebbs, MD [address] [place],  [place] [address] PCP: Nolene Ebbs, MD   Assessment & Plan: Visit Diagnoses:  1. Spinal stenosis of lumbar region with neurogenic claudication   2. Left shoulder pain, unspecified chronicity   3. Carpal tunnel syndrome, right upper limb   4. Other spondylosis with radiculopathy, cervical region   5. Primary osteoarthritis, left shoulder   6. Bilateral lower extremity edema     Plan: Elevated uric acid level indicates that some of the joint pain may be due to inflamation due to hyperuricemia or gout. Recommend antigout meds including colchicine and allopurinol and assessment of renal function. Patients with increased uric acid levels may have this due to poor kidney secretion of the uric acid into the urine due to  Decrease kidney function or due to increased levels of uric acid due to a hypermetablolic state that can be seen with occult tumor or increased protein intake. A thorough medical evaluation is recommended.  Avoid overhead lifting and overhead use of the arms. Pillows to keep from sleeping directly on the shoulders Limited lifting to less than 10 lbs. Ice or heat for relief. Stretching exercise help and strengthening is helpful to build endurance.  Appointment at [facility] tomorrow at 3:15 PM, take a lasix tablet tonight per [facility] of cardiology.  Follow-Up Instructions: Return in about 6 weeks (around 11/20/2018).   Orders:  Orders Placed This Encounter  Procedures  . XR Lumbar Spine 2-3 Views  . XR Shoulder Left  . Ambulatory referral to Physical Medicine Rehab   No orders of the defined types were placed in this encounter.     Procedures: Large Joint Inj: L glenohumeral on 10/09/2018 4:45 PM Indications: pain Details: 25 G 1.5 in needle, anteromedial  approach  Arthrogram: No  Medications: 40 mg methylPREDNISolone acetate 40 MG/ML; 4 mL bupivacaine 0.25 % Outcome: tolerated well, no immediate complications  bandaid applied Procedure, treatment alternatives, risks and benefits explained, specific risks discussed. Consent was given by the patient. Immediately prior to procedure a time out was called to verify the correct patient, procedure, equipment, support staff and site/side marked as required. Patient was prepped and draped in the usual sterile fashion.       Clinical Data: No additional findings.   Subjective: Chief Complaint  Patient presents with  . Left Shoulder - Follow-up  . Lower Back - Follow-up    78 year old female with history of neck and low back pain complaints. She has had previous Lumbar fusion with pedicle screws and rods. She has had a previous  PE and history of CHF has been having left shoulder pain with pain into the left neck. She is on atenolol for HTN. Experiencing left shoulder pain with lying on the left side. She is unable to lie on the left side.   Review of Systems  Constitutional: Negative.  Negative for activity change, appetite change, chills, diaphoresis, fatigue, fever and unexpected weight change.  HENT: Negative.  Negative for congestion, dental problem, drooling, ear discharge, ear pain, facial swelling, hearing loss, mouth sores, nosebleeds, postnasal drip, rhinorrhea, sinus pressure, sinus pain, sneezing, sore throat, tinnitus, trouble swallowing and voice change.  Eyes: Negative.  Negative for photophobia, pain, discharge, redness, itching and visual disturbance.  Respiratory: Positive for chest tightness and shortness of breath. Negative for apnea, cough, choking, wheezing and stridor.   Cardiovascular: Negative.  Negative for chest pain, palpitations and leg swelling.  Gastrointestinal: Negative.  Negative for abdominal distention, abdominal pain, anal bleeding, blood in stool,  constipation, diarrhea, nausea, rectal pain and vomiting.  Endocrine: Negative.  Negative for cold intolerance, heat intolerance, polydipsia, polyphagia and polyuria.  Genitourinary: Negative.  Negative for difficulty urinating, dyspareunia, dysuria, enuresis, flank pain and frequency.  Musculoskeletal: Positive for back pain and gait problem. Negative for arthralgias, joint swelling, myalgias, neck pain and neck stiffness.  Skin: Negative.  Negative for color change, pallor, rash and wound.  Allergic/Immunologic: Negative.   Hematological: Negative.   Psychiatric/Behavioral: Negative.      Objective: Vital Signs: BP (!) 165/79 (BP Location: Left Arm, Patient Position: Sitting)   Pulse (!) 126   Ht '5\' 4"'$  (1.626 m)   Wt 218 lb (98.9 kg)   BMI 37.42 kg/m   Physical Exam  Right Shoulder Exam   Tenderness  The patient is experiencing tenderness in the acromion and acromioclavicular joint.  Range of Motion  Active abduction:  70 abnormal  Passive abduction:  110 abnormal  Extension:  40 abnormal  External rotation:  40 abnormal  Forward flexion:  50 abnormal  Internal rotation 0 degrees: abnormal  Internal rotation 90 degrees: abnormal    Left Shoulder Exam   Tenderness  The patient is experiencing tenderness in the acromioclavicular joint and acromion.  Range of Motion  Active abduction:  70 abnormal  Passive abduction:  110 abnormal  Extension:  40 abnormal  External rotation:  40 abnormal  Forward flexion:  50 abnormal       Specialty Comments:  No specialty comments available.  Imaging: Xr Shoulder Left  Result Date: 10/09/2018 Left shoulder AP, lateral and outlet views with severe left G-H narrowing. With large inferior osteophytes. There is flattening of the humeral head and cystic changes of the humeral head and glenoid, Subacromial space is mildly narrowed, large anterior acromion spur.    PMFS History: Patient Active Problem List   Diagnosis Date  Noted  . Iron deficiency anemia 04/10/2018  . 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 Abnormality of gait 08/05/2015  . Spinal stenosis of lumbar region 08/05/2015  . Peripheral neuropathy 08/05/2015  . Chest pain 12/28/2013  . Hyperlipidemia 04/05/2013  . Systemic lupus erythematosus ([place]) 04/05/2013  . Pulmonary embolism ([place]) 01/24/2012  . Chronic diastolic heart failure ([place]) 01/24/2012  . COPD (chronic obstructive pulmonary disease) ([place]) 01/24/2012  . Diabetes mellitus ([place]) 01/24/2012  . Hypertension 01/24/2012  . Leukocytosis 01/24/2012  . Bronchitis 01/24/2012  . Anemia 01/24/2012   Past Medical History:  Diagnosis Date  . Allergic rhinitis   . Anxiety   . Arthritis   . Asthma   . Cancer [facility]) 2006   breast cancer right  . CHF (congestive heart failure) ([place])   . COPD (chronic obstructive pulmonary disease) ([place])   . Degenerative disc disease, lumbar   . Diabetes mellitus without complication ([place])   . Dyspnea    walking distances  . Eczema   . GERD (gastroesophageal reflux disease)   . Glaucoma   . History of home oxygen therapy    uses 2 liters ay night and prn  . Hyperlipidemia   . Hypertension   . Low back pain   . Lupus ([place])  skin  . Neck pain   . Numbness and tingling   . Obesity   . Osteopenia   . Pulmonary embolism ([place]) 01/2012    CT showed multi small PE and coumadized   . Sleep apnea 2010   no cpap used  . Systemic lupus erythematosus ([facility])     Family History  Problem Relation Age of Onset  . Heart failure Father   . Heart failure Mother   . Diabetes Brother   . Breast cancer Maternal Aunt   . Breast cancer Paternal Aunt     Past Surgical History:  Procedure Laterality Date  . ABDOMINAL HYSTERECTOMY  1976  . BACK SURGERY  2006   lower  . BREAST BIOPSY Right   . BREAST EXCISIONAL BIOPSY Left   . BREAST LUMPECTOMY Right   . COLONOSCOPY WITH PROPOFOL N/A 03/23/2015   Procedure: COLONOSCOPY WITH PROPOFOL;  Surgeon: Laurence Spates, MD;  Location: [facility] END[facility];  Service: Endoscopy;  Laterality: N/A;  . Dobtamine myoview  05/02/2008   EF 67% ; LV norm  . DOPPLER ECHOCARDIOGRAPHY  01/25/2012   EF 55 TO 60%; LV norm.  . EXPLORATORY LAPAROTOMY    . EYE SURGERY Bilateral 2010   lens reaplcments for cataracts   . Lower Extrem. venous doppler  01/25/2012    neg.  . TOE SURGERY  1996   Bunion   Social History   Occupational History  . Occupation: Disabled  Tobacco Use  . Smoking status: Former Research scientist (life sciences)  . Smokeless tobacco: Never Used  . Tobacco comment: Quit 1980  Substance and Sexual Activity  . Alcohol use: No    Alcohol/week: 0.0 standard drinks  . Drug use: No  . Sexual activity: Not on file

## 2018-10-10 ENCOUNTER — Ambulatory Visit (INDEPENDENT_AMBULATORY_CARE_PROVIDER_SITE_OTHER): Payer: Medicare HMO | Admitting: Medical

## 2018-10-10 VITALS — BP 146/72 | HR 111 | Temp 99.0°F | Ht 64.0 in | Wt 225.6 lb

## 2018-10-10 DIAGNOSIS — I5032 Chronic diastolic (congestive) heart failure: Secondary | ICD-10-CM | POA: Diagnosis not present

## 2018-10-10 DIAGNOSIS — N183 Chronic kidney disease, stage 3 unspecified: Secondary | ICD-10-CM

## 2018-10-10 DIAGNOSIS — E782 Mixed hyperlipidemia: Secondary | ICD-10-CM | POA: Diagnosis not present

## 2018-10-10 DIAGNOSIS — G4733 Obstructive sleep apnea (adult) (pediatric): Secondary | ICD-10-CM

## 2018-10-10 DIAGNOSIS — I10 Essential (primary) hypertension: Secondary | ICD-10-CM | POA: Diagnosis not present

## 2018-10-10 MED ORDER — FUROSEMIDE 40 MG PO TABS: ORAL_TABLET | ORAL | 0 refills | Status: DC

## 2018-10-10 MED ORDER — METOPROLOL TARTRATE 50 MG PO TABS: 50.0000 mg | ORAL_TABLET | Freq: Two times a day (BID) | ORAL | 2 refills | Status: DC

## 2018-10-10 MED ORDER — FUROSEMIDE 80 MG PO TABS: ORAL_TABLET | ORAL | 0 refills | Status: DC

## 2018-10-10 NOTE — Progress Notes (Signed)
Cardiology Office Note   Date:  10/12/2018   ID:  [name] [name], DOB 1940/08/22, MRN [id number]  PCP:  Nolene Ebbs, MD  Cardiologist:  Quay Burow, MD EP: None  Chief Complaint  Patient presents with   Leg Swelling      History of Present Illness: [name] [name] is a 78 y.o. female with PMH of chronic diastolic CHF, HTN, HLD, DM type 2, COPD, Lupus, MGUS, hx of PE, and OSA, who presents with complaints of LE edema.   She was last evaluated by cardiology via a telemedicine visit with Dr. Gwenlyn Found 10/02/2018, at which time she had chronic stable SOB 2/2 COPD. She was reported to have some LE edema which was stable on chronic diuretic therapy. Her BP was elevated at that time but patient reported normal readings at recent PCP visit. No medication changes occurred at that visit. Her last echocardiogram was in 2017 and showed EF 55-60%, mild LVH, G1DD, and peak PA pressures 76mmHg. Her last ABI's in 2015 showed normal ABI's with evidence of non-occlusive PVD. Her last ischemic evaluation was a NST in 2010 which was without ischemia.   She was seen by her orthopedist, Dr. Louanne Skye, yesterday for follow-up of her spinal stenosis/shoulder pain and was tachycardic and felt to be volume overloaded, prompting a call to our office and the scheduling of this visit for close monitoring.   She presents with complaints of weight gain, LE edema, and racing heart beat with exertion over the past week. Caregiver is present for evaluation and reports she has had DOE when getting into the car to go to appointments this week. She states that she has been off of her lasix for the past 4 months as instructed by her kidney doctor. She reports chronic orthopnea and sleeps in a recliner.  She denies SOB at rest, chest pain, dizziness, lightheadedness, or syncope.    Past Medical History:  Diagnosis Date   Allergic rhinitis    Anxiety    Arthritis    Asthma    Cancer ([place]) 2006   breast cancer right    CHF (congestive heart failure) (HCC)    COPD (chronic obstructive pulmonary disease) (HCC)    Degenerative disc disease, lumbar    Diabetes mellitus without complication (HCC)    Dyspnea    walking distances   Eczema    GERD (gastroesophageal reflux disease)    Glaucoma    History of home oxygen therapy    uses 2 liters ay night and prn   Hyperlipidemia    Hypertension    Low back pain    Lupus (HCC)    skin   Neck pain    Numbness and tingling    Obesity    Osteopenia    Pulmonary embolism ([place]) 01/2012    CT showed multi small PE and coumadized    Sleep apnea 2010   no cpap used   Systemic lupus erythematosus ([place])     Past Surgical History:  Procedure Laterality Date   ABDOMINAL HYSTERECTOMY  1976   BACK SURGERY  2006   lower   BREAST BIOPSY Right    BREAST EXCISIONAL BIOPSY Left    BREAST LUMPECTOMY Right    COLONOSCOPY WITH PROPOFOL N/A 03/23/2015   Procedure: COLONOSCOPY WITH PROPOFOL;  Surgeon: Laurence Spates, MD;  Location: [facility];  Service: Endoscopy;  Laterality: N/A;   Dobtamine myoview  05/02/2008   EF 67% ; LV norm   DOPPLER ECHOCARDIOGRAPHY  01/25/2012  EF 55 TO 60%; LV norm.   EXPLORATORY LAPAROTOMY     EYE SURGERY Bilateral 2010   lens reaplcments for cataracts    Lower [place]rem. venous doppler  01/25/2012    neg.   TOE SURGERY  1996   Bunion     Current Outpatient Medications  Medication Sig Dispense Refill   acetaminophen (TYLENOL) 650 MG CR tablet Take 1,300 mg by mouth every 8 (eight) hours as needed for pain.     albuterol (PROVENTIL HFA;VENTOLIN HFA) 108 (90 BASE) MCG/ACT inhaler Inhale 2 puffs into the lungs See admin instructions. Every 6 hours as needed for SOB but also takes 2 puffs BID scheduled.     albuterol (PROVENTIL) (2.5 MG/3ML) 0.083% nebulizer solution Take 2.5 mg by nebulization every 6 (six) hours as needed for wheezing or shortness of breath.      alendronate (FOSAMAX) 70 MG tablet Take  70 mg by mouth every Monday. Take with a full glass of water on an empty stomach.     allopurinol (ZYLOPRIM) 100 MG tablet Take 100 mg by mouth daily.     aspirin 325 MG tablet Take 325 mg by mouth daily.     brimonidine (ALPHAGAN P) 0.1 % SOLN Place 1 drop into both eyes 2 (two) times a day.     budesonide (PULMICORT) 0.5 MG/2ML nebulizer solution Take 0.5 mg by nebulization every 6 (six) hours as needed (For wheezing.).      calcium carbonate (OS-CAL) 1250 (500 Ca) MG chewable tablet Chew 1 tablet by mouth daily.     cetirizine (ZYRTEC) 10 MG tablet Take 10 mg by mouth daily.     cholecalciferol (VITAMIN D) 1000 UNITS tablet Take 1,000 Units by mouth daily.     citalopram (CELEXA) 10 MG tablet Take 10 mg by mouth daily.      clobetasol ointment (TEMOVATE) 1.28 % Apply 1 application topically 2 (two) times daily.      desonide (DESOWEN) 0.05 % cream Apply 1 application topically 2 (two) times daily.      dexlansoprazole (DEXILANT) 60 MG capsule Take 60 mg by mouth daily.      diclofenac sodium (VOLTAREN) 1 % GEL Apply 2 g topically daily. Reported on 09/15/2015     diltiazem (CARTIA XT) 300 MG 24 hr capsule Take 300 mg by mouth daily. Reported on 09/15/2015     diphenhydrAMINE (BENADRYL) 25 MG tablet Take 25 mg by mouth every 6 (six) hours as needed for itching.      gabapentin (NEURONTIN) 300 MG capsule Take 1 capsule (300 mg total) by mouth 2 (two) times daily. 60 capsule 6   hydrALAZINE (APRESOLINE) 50 MG tablet Take 50 mg by mouth 3 (three) times daily.      hydroxychloroquine (PLAQUENIL) 200 MG tablet Take 200 mg by mouth daily.      insulin aspart (NOVOLOG) 100 UNIT/ML injection Inject 10 Units into the skin 3 (three) times daily before meals.      insulin glargine (LANTUS) 100 UNIT/ML injection Inject 60-70 Units into the skin 2 (two) times daily. She takes 60 units in the morning and 70 units at bedtime.     isosorbide mononitrate (IMDUR) 30 MG 24 hr tablet Take 30 mg  by mouth every morning.     ketotifen (ALAWAY) 0.025 % ophthalmic solution Place 1 drop into both eyes daily.      LORazepam (ATIVAN) 0.5 MG tablet Take 1 tablet (0.5 mg total) by mouth every 4 (four) hours as needed for  anxiety (For anxiety with PET-CT or MRI imaging). 25 tablet 0   OXYGEN Inhale into the lungs. 2L/min - prn during day and every evening.     pravastatin (PRAVACHOL) 40 MG tablet Take 40 mg by mouth at bedtime.      SYMBICORT 160-4.5 MCG/ACT inhaler Inhale 2 puffs into the lungs 2 (two) times daily. Reported on 09/15/2015     tizanidine (ZANAFLEX) 2 MG capsule Take 2 mg by mouth 2 (two) times daily.     traMADol (ULTRAM) 50 MG tablet Take 1 tablet (50 mg total) by mouth every 6 (six) hours as needed for severe pain. 60 tablet 0   vitamin B-12 (CYANOCOBALAMIN) 1000 MCG tablet Take 1,000 mcg by mouth daily. Reported on 09/15/2015     furosemide (LASIX) 40 MG tablet Take 1 tablet twice a day for 3 days then 1 tablet once day 30 tablet 0   metoprolol tartrate (LOPRESSOR) 50 MG tablet Take 1 tablet (50 mg total) by mouth 2 (two) times daily. 60 tablet 2   No current facility-administered medications for this visit.     Allergies:   Penicillins, Fentanyl, Peach [prunus persica], and Shellfish allergy    Social History:  The patient  reports that she has quit smoking. She has never used smokeless tobacco. She reports that she does not drink alcohol or use drugs.   Family History:  The patient's family history includes Breast cancer in her maternal aunt and paternal aunt; Diabetes in her brother; Heart failure in her father and mother.    ROS:  Please see the history of present illness.   Otherwise, review of systems are positive for none.   All other systems are reviewed and negative.    PHYSICAL EXAM: VS:  BP (!) 146/72    Pulse (!) 111    Temp 99 F (37.2 C)    Ht 5\' 4"  (1.626 m)    Wt 225 lb 9.6 oz (102.3 kg)    SpO2 94%    BMI 38.72 kg/m  , BMI Body mass index is  38.72 kg/m. GEN: Well nourished, well developed, in no acute distress HEENT: Sclera anicteric Neck: no JVD, carotid bruits, or masses Cardiac: tachycardic with regular rhythm; no murmurs, rubs, or gallops, 1+ LEedema  Respiratory:  clear to auscultation bilaterally, normal work of breathing GI: soft, obese, nontender, nondistended, + BS MS: no deformity or atrophy Skin: warm and dry, no rash Neuro:  Strength and sensation are intact Psych: euthymic mood, full affect   EKG:  EKG is ordered today. The ekg ordered today demonstrates sinus tachycardia, rate 111, chronic RBBB, new LAFB, isolated TWI in III (seen on previous), no STE/D, and QTc 503.    Recent Labs: 04/10/2018: ALT 14; Hemoglobin 10.3; Platelets 279 10/10/2018: BUN 26; Creatinine, Ser 1.57; Potassium 5.0; Sodium 140    Lipid Panel No results found for: CHOL, TRIG, HDL, CHOLHDL, VLDL, LDLCALC, LDLDIRECT    Wt Readings from Last 3 Encounters:  10/10/18 225 lb 9.6 oz (102.3 kg)  10/09/18 218 lb (98.9 kg)  04/10/18 218 lb 14.4 oz (99.3 kg)      Other studies Reviewed: Additional studies/ records that were reviewed today include:   Echocardiogram 2017: Study Conclusions  - Left ventricle: The cavity size was normal. Wall thickness was   increased in a pattern of mild LVH. Systolic function was normal.   The estimated ejection fraction was in the range of 55% to 60%.   Doppler parameters are consistent with abnormal  left ventricular   relaxation (grade 1 diastolic dysfunction). - Pulmonary arteries: PA peak pressure: 50 mm Hg (S). .    ASSESSMENT AND PLAN:   1. Acute on chronic diastolic CHF: weight is up over the past week. Documented to be 218lbs yesterday at ortho appt and is 225lbs today. She's had increased LE edema and abdominal bloating. No significant change in SOB but has been experiencing tachycardia with exertion x1 week. Suspect this is due to interruption in diuretic therapy as she has been off  lasix for several months.  - Will restart lasix 40mg  BID x 3 days then resume daily dosing - Will check BMET for close monitoring of her kidneys. She reported seeing Dr. Justin Mend with [facility] last week and has a follow-up visit in a couple weeks.  - We discussed the importance of monitoring her weight daily, keeping fluid intake to <2L, and maintaining a low sodium diet with <2g salt intake - If DOE/tachycardia does not improve with diuresis, would have low threshold for VQ scan to r/o PE given history.   2. Tachycardia: EKG today with sinus tachycardia. She reports racing heart beats with exertion but denies erratic beating sensation. Suspect this could be related to #1 - Will transition from atenolol to metoprolol 50mg  BID - Diuretics as above - If no improvement with diuresis, could consider VQ scan to r/o PE and/or cardiac monitor to evaluate for arrhytmia  3. HTN: BP elevated. Possibly due to volume overload.  - Will transition from atenolol to metoprolol 50mg  BID - Will hold lisinopril-HCTZ while increasing lasix dose in an effort to preserve kidney function - Continue hydralazine  4. 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  - Will hold lisinopril-HCTZ while increasing lasix  - Will repeat BMET today  6. OSA: intolerant to CPAP   Current medicines are reviewed at length with the patient today.  The patient does not have concerns regarding medicines.  The following changes have been made:  Hold HCTZ-Lisinopril. Start lasix 40mg  BID x3 days, then 40mg  daily. Stop atenolol. Start metoprolol 50mg  BID  Labs/ tests ordered today include:   Orders Placed This Encounter  Procedures   Basic Metabolic Panel (BMET)   EKG 12-Lead     Disposition:   FU with Dr. Gloriajean Dell in 1 week  Signed, Abigail Butts, PA-C  10/12/2018 9:17 PM

## 2018-10-10 NOTE — Patient Instructions (Addendum)
Medication Instructions:  START Lasix 40mg  Take 2 tablets for 3 days then on the fourth day take 1 tablet once a day  STOP ATENOLOL HOLD LISINOPRIL/HYDROCHLOROTHIAZIDE FOR NOW  If you need a refill on your cardiac medications before your next appointment, please call your pharmacy.   Lab work: Your physician recommends that you return for lab work in: TODAY-BMET If you have labs (blood work) drawn today and your tests are completely normal, you will receive your results only by: Marland Kitchen MyChart Message (if you have MyChart) OR . A paper copy in the mail If you have any lab test that is abnormal or we need to change your treatment, we will call you to review the results.  Testing/Procedures: NONE  Follow-Up: At [facility], you and your health needs are our priority.  As part of our continuing mission to provide you with exceptional heart care, we have created designated Provider Care Teams.  These Care Teams include your primary Cardiologist (physician) and Advanced Practice Providers (APPs -  Physician Assistants and Nurse Practitioners) who all work together to provide you with the care you need, when you need it. . Your physician recommends that you schedule a follow-up appointment in: [place], PA-C OR KRISTA KROEGER, PA-C  Any Other Special Instructions Will Be Listed Below (If Applicable). WE WILL REQUEST RECORDS FROM DR MARTIN'S OFFICE

## 2018-10-10 NOTE — Telephone Encounter (Signed)
OK 

## 2018-10-11 LAB — BASIC METABOLIC PANEL
BUN/Creatinine Ratio: 17 (ref 12–28)
BUN: 26 mg/dL (ref 8–27)
CO2: 18 mmol/L — ABNORMAL LOW (ref 20–29)
Calcium: 9.9 mg/dL (ref 8.7–10.3)
Chloride: 106 mmol/L (ref 96–106)
Creatinine, Ser: 1.57 mg/dL — ABNORMAL HIGH (ref 0.57–1.00)
GFR calc Af Amer: 36 mL/min/{1.73_m2} — ABNORMAL LOW (ref >59)
GFR calc non Af Amer: 32 mL/min/{1.73_m2} — ABNORMAL LOW (ref >59)
Glucose: 248 mg/dL — ABNORMAL HIGH (ref 65–99)
Potassium: 5 mmol/L (ref 3.5–5.2)
Sodium: 140 mmol/L (ref 134–144)

## 2018-10-12 ENCOUNTER — Encounter: Payer: Self-pay | Admitting: Medical

## 2018-10-16 ENCOUNTER — Other Ambulatory Visit: Payer: Self-pay | Admitting: *Deleted

## 2018-10-16 ENCOUNTER — Ambulatory Visit: Payer: Medicare HMO | Admitting: Cardiology

## 2018-10-16 DIAGNOSIS — D509 Iron deficiency anemia, unspecified: Secondary | ICD-10-CM

## 2018-10-17 ENCOUNTER — Inpatient Hospital Stay: Payer: Medicare HMO

## 2018-10-17 ENCOUNTER — Inpatient Hospital Stay: Payer: Medicare HMO | Admitting: Hematology

## 2018-10-17 ENCOUNTER — Telehealth: Payer: Self-pay | Admitting: *Deleted

## 2018-10-17 NOTE — Telephone Encounter (Signed)
Patient care giver Leisa Lenz [phone]) called - patient experiencing lupus pain today. Needs to cancel appt with Dr. Irene Limbo and reschedule for another afternoon. Will see lupus doctor instead. Appts cancelled for 7/1. Schedule message sent

## 2018-10-18 ENCOUNTER — Telehealth: Payer: Self-pay | Admitting: Hematology

## 2018-10-18 NOTE — Telephone Encounter (Signed)
Called care giver per 7/1 sch message :  Scheduling Message Entered by Rolland Bimler on 10/17/2018 at 9:44 AM Priority: Routine <No visit type provided>  Department: [facility]  Provider:   Appointment Notes:  Needs assistance with mobility d/t walker/wheelchair/O2  Scheduling Notes:  Please contact patient to reschedule for lab/Dr.Kale. Had to cancel appts for 7/1 due to not feeling well.   Patient's care giver Leisa Lenz [phone]) is patient transportation, per patient, can also contact her. Thank you!    No answer and vmail full - message sent to RN to let her know

## 2018-10-30 ENCOUNTER — Encounter: Payer: Medicare HMO | Admitting: Physical Medicine and Rehabilitation

## 2018-10-30 ENCOUNTER — Telehealth: Payer: Self-pay | Admitting: Cardiology

## 2018-10-30 NOTE — Telephone Encounter (Signed)
Noted in appointment comments.

## 2018-10-30 NOTE — Telephone Encounter (Signed)
I called pt to confirm her appt for 10-31-18 with Kerin Ransom.        COVID-19 Pre-Screening Questions:   In the past 7 to 10 days have you had a cough,  shortness of breath, headache, congestion, fever (100 or greater) body aches, chills, sore throat, or sudden loss of taste or sense of smell? no  Have you been around anyone with known Covid 19.  Have you been around anyone who is awaiting Covid 19 test results in the past 7 to 10 days? no Have you been around anyone who has been exposed to Covid 19, or has mentioned symptoms of Covid 19 within the past 7 to 10 days?  np If you have any concerns/questions about symptoms patients report during screening (either on the phone or at threshold). Contact the provider seeing the patient or DOD for further guidance.  If neither are available contact a member of the leadership team.

## 2018-10-30 NOTE — Telephone Encounter (Signed)
New Message:    Pt has an appt with Kerin Ransom on 10-31-18. Pt says her nurse will have to come in the office with her to carry her oxygen.

## 2018-10-31 ENCOUNTER — Ambulatory Visit (INDEPENDENT_AMBULATORY_CARE_PROVIDER_SITE_OTHER): Payer: Medicare HMO | Admitting: Cardiology

## 2018-10-31 ENCOUNTER — Encounter: Payer: Self-pay | Admitting: Cardiology

## 2018-10-31 ENCOUNTER — Other Ambulatory Visit: Payer: Self-pay

## 2018-10-31 DIAGNOSIS — N183 Chronic kidney disease, stage 3 unspecified: Secondary | ICD-10-CM | POA: Insufficient documentation

## 2018-10-31 DIAGNOSIS — I10 Essential (primary) hypertension: Secondary | ICD-10-CM | POA: Diagnosis not present

## 2018-10-31 DIAGNOSIS — E669 Obesity, unspecified: Secondary | ICD-10-CM | POA: Diagnosis not present

## 2018-10-31 DIAGNOSIS — M329 Systemic lupus erythematosus, unspecified: Secondary | ICD-10-CM | POA: Diagnosis not present

## 2018-10-31 DIAGNOSIS — I5033 Acute on chronic diastolic (congestive) heart failure: Secondary | ICD-10-CM | POA: Diagnosis not present

## 2018-10-31 NOTE — Patient Instructions (Signed)
Medication Instructions:  Your physician recommends that you continue on your current medications as directed. Please refer to the Current Medication list given to you today. If you need a refill on your cardiac medications before your next appointment, please call your pharmacy.   Lab work: Your physician recommends that you return for lab work in: TODAY-BMET, BNP If you have labs (blood work) drawn today and your tests are completely normal, you will receive your results only by: Marland Kitchen MyChart Message (if you have MyChart) OR . A paper copy in the mail If you have any lab test that is abnormal or we need to change your treatment, we will call you to review the results.  Testing/Procedures: NONE  Follow-Up: At [facility], you and your health needs are our priority.  As part of our continuing mission to provide you with exceptional heart care, we have created designated Provider Care Teams.  These Care Teams include your primary Cardiologist (physician) and Advanced Practice Providers (APPs -  Physician Assistants and Nurse Practitioners) who all work together to provide you with the care you need, when you need it.  . Your physician recommends that you schedule a follow-up appointment in: [place], PA-C  Any Other Special Instructions Will Be Listed Below (If Applicable).

## 2018-10-31 NOTE — Progress Notes (Signed)
Cardiology Office Note:    Date:  10/31/2018   ID:  [name] [name], DOB 01/10/1941, MRN [id number]  PCP:  Nolene Ebbs, MD  Cardiologist:  Quay Burow, MD  Electrophysiologist:  None   Referring MD: Nolene Ebbs, MD   No chief complaint on file.   History of Present Illness:    [name] [name] is a 78 y.o. female with a hx of chronic diastolic heart failure, diabetes, lupus, obesity, capital MGUS, and chronic renal insufficiency.  Echocardiogram in 2017 showed an ejection fraction of 55 to 60% with grade 1 diastolic dysfunction and a PA pressure of 50.  Her creatinine runs in the 1.7 range.  She was seen in the office in June, she had seen her orthopedist for her spinal stenosis, they felt she was volume overloaded and called our office for the appointment.  On arrival here the patient admitted she had stopped her Lasix.  She says she was told to because it was affecting her kidney function, on further interview today she admits she does not like taking Lasix because it makes her run to the bathroom and she is unable to get there because of her obesity and spinal stenosis.  When seen 10/12/2018 we had her resume her Lasix at 40 mg twice daily for 3 days then 40 mg daily.  We held her lisinopril HCTZ.  She is here for follow-up.  Since her Lasix was resumed she feels better.  Her blood pressure is down, her heart rate is 82 today, previously she was markedly tachycardic.  She still has some edema and rales on exam.  Past Medical History:  Diagnosis Date  . Allergic rhinitis   . Anxiety   . Arthritis   . Asthma   . Cancer [facility] 2006   breast cancer right  . CHF (congestive heart failure) ([place])   . COPD (chronic obstructive pulmonary disease) ([place])   . Degenerative disc disease, lumbar   . Diabetes mellitus without complication ([place])   . Dyspnea    walking distances  . Eczema   . GERD (gastroesophageal reflux disease)   . Glaucoma   . History of home oxygen therapy    uses 2  liters ay night and prn  . Hyperlipidemia   . Hypertension   . Low back pain   . Lupus ([place])    skin  . Neck pain   . Numbness and tingling   . Obesity   . Osteopenia   . Pulmonary embolism ([place]) 01/2012    CT showed multi small PE and coumadized   . Sleep apnea 2010   no cpap used  . Systemic lupus erythematosus ([place])     Past Surgical History:  Procedure Laterality Date  . ABDOMINAL HYSTERECTOMY  1976  . BACK SURGERY  2006   lower  . BREAST BIOPSY Right   . BREAST EXCISIONAL BIOPSY Left   . BREAST LUMPECTOMY Right   . COLONOSCOPY WITH PROPOFOL N/A 03/23/2015   Procedure: COLONOSCOPY WITH PROPOFOL;  Surgeon: Laurence Spates, MD;  Location: [facility] ENDOSCOPY;  Service: Endoscopy;  Laterality: N/A;  . Dobtamine myoview  05/02/2008   EF 67% ; LV norm  . DOPPLER ECHOCARDIOGRAPHY  01/25/2012   EF 55 TO 60%; LV norm.  . EXPLORATORY LAPAROTOMY    . EYE SURGERY Bilateral 2010   lens reaplcments for cataracts   . [place] venous doppler  01/25/2012    neg.  . [place]  Current Medications: Current Meds  Medication Sig  . acetaminophen (TYLENOL) 650 MG CR tablet Take 1,300 mg by mouth every 8 (eight) hours as needed for pain.  Marland Kitchen albuterol (PROVENTIL HFA;VENTOLIN HFA) 108 (90 BASE) MCG/ACT inhaler Inhale 2 puffs into the lungs See admin instructions. Every 6 hours as needed for SOB but also takes 2 puffs BID scheduled.  Marland Kitchen albuterol (PROVENTIL) (2.5 MG/3ML) 0.083% nebulizer solution Take 2.5 mg by nebulization every 6 (six) hours as needed for wheezing or shortness of breath.   Marland Kitchen alendronate (FOSAMAX) 70 MG tablet Take 70 mg by mouth every Monday. Take with a full glass of water on an empty stomach.  Marland Kitchen allopurinol (ZYLOPRIM) 100 MG tablet Take 100 mg by mouth daily.  Marland Kitchen aspirin 325 MG tablet Take 325 mg by mouth daily.  . brimonidine (ALPHAGAN P) 0.1 % SOLN Place 1 drop into both eyes 2 (two) times a day.  . budesonide (PULMICORT) 0.5 MG/2ML nebulizer solution  Take 0.5 mg by nebulization every 6 (six) hours as needed (For wheezing.).   Marland Kitchen calcium carbonate (OS-CAL) 1250 (500 Ca) MG chewable tablet Chew 1 tablet by mouth daily.  . cetirizine (ZYRTEC) 10 MG tablet Take 10 mg by mouth daily.  . cholecalciferol (VITAMIN D) 1000 UNITS tablet Take 1,000 Units by mouth daily.  . citalopram (CELEXA) 10 MG tablet Take 10 mg by mouth daily.   . clobetasol ointment (TEMOVATE) 2.63 % Apply 1 application topically 2 (two) times daily.   Marland Kitchen desonide (DESOWEN) 0.05 % cream Apply 1 application topically 2 (two) times daily.   Marland Kitchen dexlansoprazole (DEXILANT) 60 MG capsule Take 60 mg by mouth daily.   . diclofenac sodium (VOLTAREN) 1 % GEL Apply 2 g topically daily. Reported on 09/15/2015  . diltiazem (CARTIA XT) 300 MG 24 hr capsule Take 300 mg by mouth daily. Reported on 09/15/2015  . diphenhydrAMINE (BENADRYL) 25 MG tablet Take 25 mg by mouth every 6 (six) hours as needed for itching.   . furosemide (LASIX) 40 MG tablet Take 1 tablet twice a day for 3 days then 1 tablet once day  . gabapentin (NEURONTIN) 300 MG capsule Take 1 capsule (300 mg total) by mouth 2 (two) times daily.  . hydrALAZINE (APRESOLINE) 50 MG tablet Take 50 mg by mouth 3 (three) times daily.   . hydroxychloroquine (PLAQUENIL) 200 MG tablet Take 200 mg by mouth daily.   . insulin aspart (NOVOLOG) 100 UNIT/ML injection Inject 10 Units into the skin 3 (three) times daily before meals.   . insulin glargine (LANTUS) 100 UNIT/ML injection Inject 60-70 Units into the skin 2 (two) times daily. She takes 60 units in the morning and 70 units at bedtime.  . isosorbide mononitrate (IMDUR) 30 MG 24 hr tablet Take 30 mg by mouth every morning.  Marland Kitchen ketotifen (ALAWAY) 0.025 % ophthalmic solution Place 1 drop into both eyes daily.   Marland Kitchen LORazepam (ATIVAN) 0.5 MG tablet Take 1 tablet (0.5 mg total) by mouth every 4 (four) hours as needed for anxiety (For anxiety with PET-CT or MRI imaging).  . metoprolol tartrate (LOPRESSOR)  50 MG tablet Take 1 tablet (50 mg total) by mouth 2 (two) times daily.  . OXYGEN Inhale into the lungs. 2L/min - prn during day and every evening.  . pravastatin (PRAVACHOL) 40 MG tablet Take 40 mg by mouth at bedtime.   . SYMBICORT 160-4.5 MCG/ACT inhaler Inhale 2 puffs into the lungs 2 (two) times daily. Reported on 09/15/2015  . tizanidine (ZANAFLEX)  2 MG capsule Take 2 mg by mouth 2 (two) times daily.  . traMADol (ULTRAM) 50 MG tablet Take 1 tablet (50 mg total) by mouth every 6 (six) hours as needed for severe pain.  . vitamin B-12 (CYANOCOBALAMIN) 1000 MCG tablet Take 1,000 mcg by mouth daily. Reported on 09/15/2015     Allergies:   Penicillins, Fentanyl, Peach [prunus persica], and Shellfish allergy   Social History   Socioeconomic History  . Marital status: Single    Spouse name: Not on file  . Number of children: 5  . Years of education: 85  . Highest education level: Not on file  Occupational History  . Occupation: Disabled  Social Needs  . Financial resource strain: Not on file  . Food insecurity    Worry: Not on file    Inability: Not on file  . Transportation needs    Medical: Not on file    Non-medical: Not on file  Tobacco Use  . Smoking status: Former Research scientist (life sciences)  . Smokeless tobacco: Never Used  . Tobacco comment: Quit 1980  Substance and Sexual Activity  . Alcohol use: No    Alcohol/week: 0.0 standard drinks  . Drug use: No  . Sexual activity: Not on file  Lifestyle  . Physical activity    Days per week: Not on file    Minutes per session: Not on file  . Stress: Not on file  Relationships  . Social Herbalist on phone: Not on file    Gets together: Not on file    Attends religious service: Not on file    Active member of club or organization: Not on file    Attends meetings of clubs or organizations: Not on file    Relationship status: Not on file  Other Topics Concern  . Not on file  Social History Narrative   Lives at home alone.    Right-handed.   2-4 cups caffeine daily.     Family History: The patient's family history includes Breast cancer in her maternal aunt and paternal aunt; Diabetes in her brother; Heart failure in her father and mother.  ROS:   Please see the history of present illness.     All other systems reviewed and are negative.  EKGs/Labs/Other Studies Reviewed:    The following studies were reviewed today: Echo 2017  EKG:  EKG is ordered today.  The ekg ordered today demonstrates NSR, RBBB, LAD   Recent Labs: 04/10/2018: ALT 14; Hemoglobin 10.3; Platelets 279 10/10/2018: BUN 26; Creatinine, Ser 1.57; Potassium 5.0; Sodium 140  Recent Lipid Panel No results found for: CHOL, TRIG, HDL, CHOLHDL, VLDL, LDLCALC, LDLDIRECT  Physical Exam:    VS:  BP (!) 122/52 (BP Location: Left Arm, Patient Position: Sitting, Cuff Size: Large)   Pulse 82   Temp 98.4 F (36.9 C)   Ht 5\' 4"  (1.626 m)   Wt 221 lb (100.2 kg)   BMI 37.93 kg/m     Wt Readings from Last 3 Encounters:  10/31/18 221 lb (100.2 kg)  10/10/18 225 lb 9.6 oz (102.3 kg)  10/09/18 218 lb (98.9 kg)     GEN: obese AA female,  in no acute distress HEENT: Normal NECK: No JVD; No carotid bruits LYMPHATICS: No lymphadenopathy CARDIAC: RRR, no murmurs, rubs, gallops RESPIRATORY:  Bi basilar rales ABDOMEN: Soft, non-tender, non-distended MUSCULOSKELETAL:  Trace LE edema; No deformity  SKIN: Warm and dry NEUROLOGIC:  Alert and oriented x 3 PSYCHIATRIC:  Normal affect  ASSESSMENT:    Acute on chronic diastolic (congestive) heart failure (HCC) Seen in f/u from her 6/24/OV- weight down 4lbs from 6/24, symptoms better but still some volume on exam  Hypertension Controlled  Obesity (BMI 30-39.9) BMI 37  Systemic lupus erythematosus On Plaquenil  CRI (chronic renal insufficiency), stage 3 (moderate) (HCC) Followed by Dr Justin Mend  PLAN:    I suggested she take 80 mg of Lasix for another two days then go back to 40 mg daily.  She  has a f/u with Dr Justin Mend in two weeks.  I ordered BMP and BNP today.    Medication Adjustments/Labs and Tests Ordered: Current medicines are reviewed at length with the patient today.  Concerns regarding medicines are outlined above.  No orders of the defined types were placed in this encounter.  No orders of the defined types were placed in this encounter.   Patient Instructions  Medication Instructions:  Your physician recommends that you continue on your current medications as directed. Please refer to the Current Medication list given to you today. If you need a refill on your cardiac medications before your next appointment, please call your pharmacy.   Lab work: Your physician recommends that you return for lab work in: TODAY-BMET, BNP If you have labs (blood work) drawn today and your tests are completely normal, you will receive your results only by: Marland Kitchen MyChart Message (if you have MyChart) OR . A paper copy in the mail If you have any lab test that is abnormal or we need to change your treatment, we will call you to review the results.  Testing/Procedures: NONE  Follow-Up: At [facility], you and your health needs are our priority.  As part of our continuing mission to provide you with exceptional heart care, we have created designated Provider Care Teams.  These Care Teams include your primary Cardiologist (physician) and Advanced Practice Providers (APPs -  Physician Assistants and Nurse Practitioners) who all work together to provide you with the care you need, when you need it.  . Your physician recommends that you schedule a follow-up appointment in: [place], PA-C  Any Other Special Instructions Will Be Listed Below (If Applicable).      Signed, Kerin Ransom, PA-C  10/31/2018 2:55 PM    [facility]

## 2018-10-31 NOTE — Assessment & Plan Note (Signed)
Controlled.  

## 2018-10-31 NOTE — Assessment & Plan Note (Signed)
BMI 37  

## 2018-10-31 NOTE — Assessment & Plan Note (Signed)
Followed by Dr Justin Mend

## 2018-10-31 NOTE — Assessment & Plan Note (Signed)
On Plaquenil 

## 2018-10-31 NOTE — Assessment & Plan Note (Signed)
Seen in f/u from her 6/24/OV- weight down, symptoms better but still some volume on exam

## 2018-11-01 LAB — BASIC METABOLIC PANEL
BUN/Creatinine Ratio: 18 (ref 12–28)
BUN: 35 mg/dL — ABNORMAL HIGH (ref 8–27)
CO2: 19 mmol/L — ABNORMAL LOW (ref 20–29)
Calcium: 10.2 mg/dL (ref 8.7–10.3)
Chloride: 110 mmol/L — ABNORMAL HIGH (ref 96–106)
Creatinine, Ser: 1.9 mg/dL — ABNORMAL HIGH (ref 0.57–1.00)
GFR calc Af Amer: 29 mL/min/{1.73_m2} — ABNORMAL LOW (ref >59)
GFR calc non Af Amer: 25 mL/min/{1.73_m2} — ABNORMAL LOW (ref >59)
Glucose: 146 mg/dL — ABNORMAL HIGH (ref 65–99)
Potassium: 5.5 mmol/L — ABNORMAL HIGH (ref 3.5–5.2)
Sodium: 144 mmol/L (ref 134–144)

## 2018-11-01 LAB — PRO B NATRIURETIC PEPTIDE: NT-Pro BNP: 478 pg/mL (ref 0–738)

## 2018-11-08 ENCOUNTER — Other Ambulatory Visit: Payer: Self-pay

## 2018-11-08 DIAGNOSIS — N183 Chronic kidney disease, stage 3 unspecified: Secondary | ICD-10-CM

## 2018-11-12 LAB — BASIC METABOLIC PANEL
BUN/Creatinine Ratio: 14 (ref 12–28)
BUN: 22 mg/dL (ref 8–27)
CO2: 15 mmol/L — ABNORMAL LOW (ref 20–29)
Calcium: 10.3 mg/dL (ref 8.7–10.3)
Chloride: 110 mmol/L — ABNORMAL HIGH (ref 96–106)
Creatinine, Ser: 1.6 mg/dL — ABNORMAL HIGH (ref 0.57–1.00)
GFR calc Af Amer: 36 mL/min/{1.73_m2} — ABNORMAL LOW (ref >59)
GFR calc non Af Amer: 31 mL/min/{1.73_m2} — ABNORMAL LOW (ref >59)
Glucose: 98 mg/dL (ref 65–99)
Potassium: 5 mmol/L (ref 3.5–5.2)
Sodium: 138 mmol/L (ref 134–144)

## 2018-11-22 ENCOUNTER — Ambulatory Visit: Payer: Medicare HMO | Admitting: Specialist

## 2018-11-23 ENCOUNTER — Other Ambulatory Visit: Payer: Self-pay

## 2018-11-23 ENCOUNTER — Ambulatory Visit (INDEPENDENT_AMBULATORY_CARE_PROVIDER_SITE_OTHER): Payer: Medicare HMO | Admitting: Physical Medicine and Rehabilitation

## 2018-11-23 ENCOUNTER — Encounter: Payer: Self-pay | Admitting: Physical Medicine and Rehabilitation

## 2018-11-23 DIAGNOSIS — R202 Paresthesia of skin: Secondary | ICD-10-CM

## 2018-11-23 NOTE — Progress Notes (Signed)
 .  Numeric Pain Rating Scale and Functional Assessment Average Pain 10   In the last MONTH (on 0-10 scale) has pain interfered with the following?  1. General activity like being  able to carry out your everyday physical activities such as walking, climbing stairs, carrying groceries, or moving a chair?  Rating(9)     

## 2018-11-26 NOTE — Progress Notes (Signed)
[name] [name] - 78 y.o. female MRN [id number]  Date of birth: 1940/10/15  Office Visit Note: Visit Date: 11/23/2018 PCP: Nolene Ebbs, MD Referred by: Nolene Ebbs, MD  Subjective: Chief Complaint  Patient presents with  . Right Hand - Pain, Numbness   HPI:  [name] [name] is a 78 y.o. female who comes in today At the request of Dr. Basil Dess for electrodiagnostic study of the right upper limb.  She reports several years of symptoms but several months of severe worsening of pain numbness and tingling particularly in the right thumb and index and middle digits.  She reports difficulty grabbing small objects and holding and manipulating objects.  She reports increased pain with grabbing and using the hand.  She reports that prior injections seem to help but is unsure where the injection was actually performed.  She is right-hand dominant.  She does have a diagnosis of diabetes.  There is no recent hemoglobin A1c.  Recent serial basic metabolic panel shows increased blood glucose.  She denies any left-sided symptoms.  She denies any frank radicular symptoms.  ROS Otherwise per HPI.  Assessment & Plan: Visit Diagnoses:  1. Paresthesia of skin     Plan: Impression: The above electrodiagnostic study is ABNORMAL and reveals evidence of a severe right median nerve entrapment at the wrist (carpal tunnel syndrome) affecting sensory and motor components. The lesion is characterized by sensory and motor demyelination with evidence of significant axonal injury. **There is likely considerable permanent nerve damage.  There is no significant electrodiagnostic evidence of any other focal nerve entrapment, brachial plexopathy or cervical radiculopathy.   Recommendations: 1.  Follow-up with referring physician. 2.  Continue current management of symptoms. 3.  Suggest surgical evaluation.  Meds & Orders: No orders of the defined types were placed in this encounter.   Orders Placed This Encounter   Procedures  . NCV with EMG (electromyography)    Follow-up: Return for Beatriz Stallion, MD 11/29/2018.   Procedures: No procedures performed  EMG & NCV Findings: Evaluation of the right median motor nerve showed no response (Wrist) and no response (Elbow).  The right median (across palm) sensory nerve showed no response (Wrist) and no response (Palm).  All remaining nerves (as indicated in the following tables) were within normal limits.    Needle evaluation of the right abductor pollicis brevis muscle showed decreased insertional activity, widespread spontaneous activity, decreased motor unit amplitude, and diminished recruitment.  All remaining muscles (as indicated in the following table) showed no evidence of electrical instability.    Impression: The above electrodiagnostic study is ABNORMAL and reveals evidence of a severe right median nerve entrapment at the wrist (carpal tunnel syndrome) affecting sensory and motor components. The lesion is characterized by sensory and motor demyelination with evidence of significant axonal injury. **There is likely considerable permanent nerve damage.  There is no significant electrodiagnostic evidence of any other focal nerve entrapment, brachial plexopathy or cervical radiculopathy.   Recommendations: 1.  Follow-up with referring physician. 2.  Continue current management of symptoms. 3.  Suggest surgical evaluation.  ___________________________ Laurence Spates FAAPMR Board Certified, American Board of Physical Medicine and Rehabilitation    Nerve Conduction Studies Anti Sensory Summary Table   Stim Site NR Peak (ms) Norm Peak (ms) P-T Amp (V) Norm P-T Amp Site1 Site2 Delta-P (ms) Dist (cm) Vel (m/s) Norm Vel (m/s)  Right Median Acr Palm Anti Sensory (2nd Digit)  32.7C  Wrist *NR  <3.6  >10 Wrist Palm  0.0    Palm *NR  <2.0          Right Radial Anti Sensory (Base 1st Digit)  32.2C  Wrist    2.4 <3.1 4.9  Wrist Base 1st Digit 2.4 0.0     Right Ulnar Anti Sensory (5th Digit)  32.6C  Wrist    3.5 <3.7 20.5 >15.0 Wrist 5th Digit 3.5 14.0 40 >38   Motor Summary Table   Stim Site NR Onset (ms) Norm Onset (ms) O-P Amp (mV) Norm O-P Amp Site1 Site2 Delta-0 (ms) Dist (cm) Vel (m/s) Norm Vel (m/s)  Right Median Motor (Abd Poll Brev)  32.3C  Wrist *NR  <4.2  >5 Elbow Wrist  0.0  >50  Elbow *NR            Right Ulnar Motor (Abd Dig Min)  32.4C  Wrist    3.7 <4.2 8.1 >3 B Elbow Wrist 3.8 21.5 57 >53  B Elbow    7.5  7.8  A Elbow B Elbow 1.6 10.0 63 >53  A Elbow    9.1  8.1          EMG   Side Muscle Nerve Root Ins Act Fibs Psw Amp Dur Poly Recrt Int Fraser Din Comment  Right Abd Poll Brev Median C8-T1 *Decr *4+ *4+ *Decr Nml 0 *Reduced Nml no muaps  Right 1stDorInt Ulnar C8-T1 Nml Nml Nml Nml Nml 0 Nml Nml   Right PronatorTeres Median C6-7 Nml Nml Nml Nml Nml 0 Nml Nml   Right Biceps Musculocut C5-6 Nml Nml Nml Nml Nml 0 Nml Nml   Right Deltoid Axillary C5-6 Nml Nml Nml Nml Nml 0 Nml Nml     Nerve Conduction Studies Anti Sensory Left/Right Comparison   Stim Site L Lat (ms) R Lat (ms) L-R Lat (ms) L Amp (V) R Amp (V) L-R Amp (%) Site1 Site2 L Vel (m/s) R Vel (m/s) L-R Vel (m/s)  Median Acr Palm Anti Sensory (2nd Digit)  32.7C  Wrist       Wrist Palm     Palm             Radial Anti Sensory (Base 1st Digit)  32.2C  Wrist  2.4   4.9  Wrist Base 1st Digit     Ulnar Anti Sensory (5th Digit)  32.6C  Wrist  3.5   20.5  Wrist 5th Digit  40    Motor Left/Right Comparison   Stim Site L Lat (ms) R Lat (ms) L-R Lat (ms) L Amp (mV) R Amp (mV) L-R Amp (%) Site1 Site2 L Vel (m/s) R Vel (m/s) L-R Vel (m/s)  Median Motor (Abd Poll Brev)  32.3C  Wrist       Elbow Wrist     Elbow             Ulnar Motor (Abd Dig Min)  32.4C  Wrist  3.7   8.1  B Elbow Wrist  57   B Elbow  7.5   7.8  A Elbow B Elbow  63   A Elbow  9.1   8.1           Waveforms:            Clinical History: 01/07/2015 Electrodiagnostic  IMPRESSION:  This  is an abnormal study. 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 There was also evidence of mild chronic lumbar radiculopathies, mainly involving L 4, L5 myotomes.    INTERPRETING PHYSICIAN:   Catalina Pizza.D.  Ph.D. [facility] [address], [place], [place] [address] [phone]     Objective:  VS:  HT:    WT:   BMI:     BP:   HR: bpm  TEMP: ( )  RESP:  Physical Exam Musculoskeletal:        General: No swelling, tenderness or deformity.     Comments: Inspection reveals some atrophy of the right APB compared to left but no atrophy of the bilateral or FDI or hand intrinsics. There is no swelling, color changes, allodynia or dystrophic changes. There is significantly decreased strength with right thumb abduction but 5 out of 5 strength in the bilateral wrist extension, finger abduction and long finger flexion.  There is decreased sensation to light touch in the median nerve distribution on the right. and elbow. There is a negative Phalen's test bilaterally. There is a negative Hoffmann's test bilaterally.  Skin:    General: Skin is warm and dry.     Findings: No erythema or rash.  Neurological:     General: No focal deficit present.     Mental Status: She is alert and oriented to person, place, and time.     Motor: No weakness or abnormal muscle tone.     Coordination: Coordination normal.  Psychiatric:        Mood and Affect: Mood normal.        Behavior: Behavior normal.     Ortho Exam Imaging: No results found.

## 2018-11-26 NOTE — Procedures (Signed)
EMG & NCV Findings: Evaluation of the right median motor nerve showed no response (Wrist) and no response (Elbow).  The right median (across palm) sensory nerve showed no response (Wrist) and no response (Palm).  All remaining nerves (as indicated in the following tables) were within normal limits.    Needle evaluation of the right abductor pollicis brevis muscle showed decreased insertional activity, widespread spontaneous activity, decreased motor unit amplitude, and diminished recruitment.  All remaining muscles (as indicated in the following table) showed no evidence of electrical instability.    Impression: The above electrodiagnostic study is ABNORMAL and reveals evidence of a severe right median nerve entrapment at the wrist (carpal tunnel syndrome) affecting sensory and motor components. The lesion is characterized by sensory and motor demyelination with evidence of significant axonal injury. **There is likely considerable permanent nerve damage.  There is no significant electrodiagnostic evidence of any other focal nerve entrapment, brachial plexopathy or cervical radiculopathy.   Recommendations: 1.  Follow-up with referring physician. 2.  Continue current management of symptoms. 3.  Suggest surgical evaluation.  ___________________________ Laurence Spates FAAPMR Board Certified, American Board of Physical Medicine and Rehabilitation    Nerve Conduction Studies Anti Sensory Summary Table   Stim Site NR Peak (ms) Norm Peak (ms) P-T Amp (V) Norm P-T Amp Site1 Site2 Delta-P (ms) Dist (cm) Vel (m/s) Norm Vel (m/s)  Right Median Acr Palm Anti Sensory (2nd Digit)  32.7C  Wrist *NR  <3.6  >10 Wrist Palm  0.0    Palm *NR  <2.0          Right Radial Anti Sensory (Base 1st Digit)  32.2C  Wrist    2.4 <3.1 4.9  Wrist Base 1st Digit 2.4 0.0    Right Ulnar Anti Sensory (5th Digit)  32.6C  Wrist    3.5 <3.7 20.5 >15.0 Wrist 5th Digit 3.5 14.0 40 >38   Motor Summary Table   Stim Site NR  Onset (ms) Norm Onset (ms) O-P Amp (mV) Norm O-P Amp Site1 Site2 Delta-0 (ms) Dist (cm) Vel (m/s) Norm Vel (m/s)  Right Median Motor (Abd Poll Brev)  32.3C  Wrist *NR  <4.2  >5 Elbow Wrist  0.0  >50  Elbow *NR            Right Ulnar Motor (Abd Dig Min)  32.4C  Wrist    3.7 <4.2 8.1 >3 B Elbow Wrist 3.8 21.5 57 >53  B Elbow    7.5  7.8  A Elbow B Elbow 1.6 10.0 63 >53  A Elbow    9.1  8.1          EMG   Side Muscle Nerve Root Ins Act Fibs Psw Amp Dur Poly Recrt Int Fraser Din Comment  Right Abd Poll Brev Median C8-T1 *Decr *4+ *4+ *Decr Nml 0 *Reduced Nml no muaps  Right 1stDorInt Ulnar C8-T1 Nml Nml Nml Nml Nml 0 Nml Nml   Right PronatorTeres Median C6-7 Nml Nml Nml Nml Nml 0 Nml Nml   Right Biceps Musculocut C5-6 Nml Nml Nml Nml Nml 0 Nml Nml   Right Deltoid Axillary C5-6 Nml Nml Nml Nml Nml 0 Nml Nml     Nerve Conduction Studies Anti Sensory Left/Right Comparison   Stim Site L Lat (ms) R Lat (ms) L-R Lat (ms) L Amp (V) R Amp (V) L-R Amp (%) Site1 Site2 L Vel (m/s) R Vel (m/s) L-R Vel (m/s)  Median Acr Palm Anti Sensory (2nd Digit)  32.7C  Wrist  Wrist Palm     Palm             Radial Anti Sensory (Base 1st Digit)  32.2C  Wrist  2.4   4.9  Wrist Base 1st Digit     Ulnar Anti Sensory (5th Digit)  32.6C  Wrist  3.5   20.5  Wrist 5th Digit  40    Motor Left/Right Comparison   Stim Site L Lat (ms) R Lat (ms) L-R Lat (ms) L Amp (mV) R Amp (mV) L-R Amp (%) Site1 Site2 L Vel (m/s) R Vel (m/s) L-R Vel (m/s)  Median Motor (Abd Poll Brev)  32.3C  Wrist       Elbow Wrist     Elbow             Ulnar Motor (Abd Dig Min)  32.4C  Wrist  3.7   8.1  B Elbow Wrist  57   B Elbow  7.5   7.8  A Elbow B Elbow  63   A Elbow  9.1   8.1           Waveforms:

## 2018-11-29 ENCOUNTER — Ambulatory Visit (INDEPENDENT_AMBULATORY_CARE_PROVIDER_SITE_OTHER): Payer: Medicare HMO | Admitting: Specialist

## 2018-11-29 ENCOUNTER — Encounter: Payer: Self-pay | Admitting: Specialist

## 2018-11-29 VITALS — BP 174/85 | HR 97 | Ht 64.0 in | Wt 218.0 lb

## 2018-11-29 DIAGNOSIS — G5601 Carpal tunnel syndrome, right upper limb: Secondary | ICD-10-CM

## 2018-11-29 NOTE — Patient Instructions (Signed)
May use the wrist splints as needed. Our office will contact you to schedule for a left open carpal tunnel release. Kandice Hams is the surgery scheduler and she will get approval from your insurance and call you. Risk of surgery includes risk infection 1:300, bleeding is not usual, 1 in 1,000,000 risk of blood loss that is significant. Risk to the nerve is 1:30,000. Carpal Tunnel Syndrome  Carpal tunnel syndrome is a condition that causes pain in your hand and arm. The carpal tunnel is a narrow area located on the palm side of your wrist. Repeated wrist motion or certain diseases may cause swelling within the tunnel. This swelling pinches the main nerve in the wrist (median nerve). What are the causes? This condition may be caused by:  Repeated wrist motions.  Wrist injuries.  Arthritis.  A cyst or tumor in the carpal tunnel.  Fluid buildup during pregnancy. Sometimes the cause of this condition is not known. What increases the risk? This condition is more likely to develop in:  People who have jobs that cause them to repeatedly move their wrists in the same motion, such as Art gallery manager.  Women.  People with certain conditions, such as: ? Diabetes. ? Obesity. ? An underactive thyroid (hypothyroidism). ? Kidney failure. What are the signs or symptoms? Symptoms of this condition include:  A tingling feeling in your fingers, especially in your thumb, index, and middle fingers.  Tingling or numbness in your hand.  An aching feeling in your entire arm, especially when your wrist and elbow are bent for long periods of time.  Wrist pain that goes up your arm to your shoulder.  Pain that goes down into your palm or fingers.  A weak feeling in your hands. You may have trouble grabbing and holding items. Your symptoms may feel worse during the night. How is this diagnosed? This condition is diagnosed with a medical history and physical exam. You may also have tests,  including:  An electromyogram (EMG). This test measures electrical signals sent by your nerves into the muscles.  X-rays. How is this treated? Treatment for this condition includes:  Lifestyle changes. It is important to stop doing or modify the activity that caused your condition.  Physical or occupational therapy.  Medicines for pain and inflammation. This may include medicine that is injected into your wrist.  A wrist splint.  Surgery. Follow these instructions at home: If you have a splint:   Wear it as told by your health care provider. Remove it only as told by your health care provider.  Loosen the splint if your fingers become numb and tingle, or if they turn cold and blue.  Keep the splint clean and dry. General instructions   Take over-the-counter and prescription medicines only as told by your health care provider.  Rest your wrist from any activity that may be causing your pain. If your condition is work related, talk to your employer about changes that can be made, such as getting a wrist pad to use while typing.  If directed, apply ice to the painful area: ? Put ice in a plastic bag. ? Place a towel between your skin and the bag. ? Leave the ice on for 20 minutes, 2-3 times per day.  Keep all follow-up visits as told by your health care provider. This is important.  Do any exercises as told by your health care provider, physical therapist, or occupational therapist. Contact a health care provider if:  You have new  symptoms.  Your pain is not controlled with medicines.  Your symptoms get worse. This information is not intended to replace advice given to you by your health care provider. Make sure you discuss any questions you have with your health care provider. Document Released: 04/01/2000 Document Revised: 08/13/2015 Document Reviewed: 12/14/2016 Elsevier Interactive Patient Education  2017 [employer].

## 2018-11-29 NOTE — Progress Notes (Signed)
Office Visit Note   Patient: [name] [name]           Date of Birth: 06-06-1940           MRN: [id number] Visit Date: 11/29/2018              Requested by: Nolene Ebbs, MD [address] [place],  [place] [address] PCP: Nolene Ebbs, MD   Assessment & Plan: Visit Diagnoses:  1. Carpal tunnel syndrome, right upper limb     Plan: Carpal Tunnel Syndrome  Carpal tunnel syndrome is a condition that causes pain in your hand and arm. The carpal tunnel is a narrow area located on the palm side of your wrist. Repeated wrist motion or certain diseases may cause swelling within the tunnel. This swelling pinches the main nerve in the wrist (median nerve). What are the causes? This condition may be caused by:  Repeated wrist motions.  Wrist injuries.  Arthritis.  A cyst or tumor in the carpal tunnel.  Fluid buildup during pregnancy. Sometimes the cause of this condition is not known. What increases the risk? This condition is more likely to develop in:  People who have jobs that cause them to repeatedly move their wrists in the same motion, such as Art gallery manager.  Women.  People with certain conditions, such as: ? Diabetes. ? Obesity. ? An underactive thyroid (hypothyroidism). ? Kidney failure. What are the signs or symptoms? Symptoms of this condition include:  A tingling feeling in your fingers, especially in your thumb, index, and middle fingers.  Tingling or numbness in your hand.  An aching feeling in your entire arm, especially when your wrist and elbow are bent for long periods of time.  Wrist pain that goes up your arm to your shoulder.  Pain that goes down into your palm or fingers.  A weak feeling in your hands. You may have trouble grabbing and holding items. Your symptoms may feel worse during the night. How is this diagnosed? This condition is diagnosed with a medical history and physical exam. You may also have tests, including:  An  electromyogram (EMG). This test measures electrical signals sent by your nerves into the muscles.  X-rays. How is this treated? Treatment for this condition includes:  Lifestyle changes. It is important to stop doing or modify the activity that caused your condition.  Physical or occupational therapy.  Medicines for pain and inflammation. This may include medicine that is injected into your wrist.  A wrist splint.  Surgery. Follow these instructions at home: If you have a splint:   Wear it as told by your health care provider. Remove it only as told by your health care provider.  Loosen the splint if your fingers become numb and tingle, or if they turn cold and blue.  Keep the splint clean and dry. General instructions   Take over-the-counter and prescription medicines only as told by your health care provider.  Rest your wrist from any activity that may be causing your pain. If your condition is work related, talk to your employer about changes that can be made, such as getting a wrist pad to use while typing.  If directed, apply ice to the painful area: ? Put ice in a plastic bag. ? Place a towel between your skin and the bag. ? Leave the ice on for 20 minutes, 2-3 times per day.  Keep all follow-up visits as told by your health care provider. This is important.  Do any  exercises as told by your health care provider, physical therapist, or occupational therapist. Contact a health care provider if:  You have new symptoms.  Your pain is not controlled with medicines.  Your symptoms get worse. This information is not intended to replace advice given to you by your health care provider. Make sure you discuss any questions you have with your health care provider. Document Released: 04/01/2000 Document Revised: 08/13/2015 Document Reviewed: 12/14/2016 May use the wrist splints as needed. Our office will contact you to schedule for a left open carpal tunnel release. Kandice Hams is the surgery scheduler and she will get approval from your insurance and call you. Risk of surgery includes risk infection 1:300, bleeding is not usual, 1 in 1,000,000 risk of blood loss that is significant. Risk to the nerve is 1:30,000.  Follow-Up Instructions: Return in about 4 weeks (around 12/27/2018).   Orders:  No orders of the defined types were placed in this encounter.  No orders of the defined types were placed in this encounter.     Procedures: No procedures performed   Clinical Data: No additional findings.   Subjective: Chief Complaint  Patient presents with  . Right Arm - Follow-up    NCS/EMG RUE Review  . Left Shoulder - Pain    77 year old female with increasing right hand numbness and pain worse at night. She uses a walker and she notes the right hand is experiencing numbness in the radial 3 digits. She under went the recent EMG/NCV with findings of severe right carpal tunnel syndrome.    Review of Systems  Constitutional: Negative.  Negative for activity change, appetite change, chills, diaphoresis, fatigue, fever and unexpected weight change.  HENT: Negative.   Eyes: Negative.   Respiratory: Negative.   Cardiovascular: Negative.   Gastrointestinal: Negative.   Endocrine: Negative.   Genitourinary: Negative.   Musculoskeletal: Negative.   Skin: Negative.   Allergic/Immunologic: Negative.   Neurological: Positive for weakness and numbness.  Hematological: Negative.   Psychiatric/Behavioral: Negative.      Objective: Vital Signs: BP (!) 174/85   Pulse 97   Ht 5' 4" (1.626 m)   Wt 218 lb (98.9 kg)   BMI 37.42 kg/m   Physical Exam Constitutional:      Appearance: She is well-developed.  HENT:     Head: Normocephalic and atraumatic.  Eyes:     Pupils: Pupils are equal, round, and reactive to light.  Neck:     Musculoskeletal: Normal range of motion and neck supple.  Pulmonary:     Effort: Pulmonary effort is normal.      Breath sounds: Normal breath sounds.  Abdominal:     General: Bowel sounds are normal.     Palpations: Abdomen is soft.  Skin:    General: Skin is warm and dry.  Neurological:     Mental Status: She is alert and oriented to person, place, and time.  Psychiatric:        Behavior: Behavior normal.        Thought Content: Thought content normal.        Judgment: Judgment normal.     Right Hand Exam   Tenderness  The patient is experiencing tenderness in the palmar area.  Range of Motion  Wrist  Extension:  45 abnormal  Flexion:  50 abnormal  Pronation:  40 abnormal  Supination:  70 abnormal   Muscle Strength  Wrist extension: 5/5  Wrist flexion: 5/5  Grip: 5/5  Tests  Phalen's sign: positive Tinel's sign (median nerve): positive Finkelstein's test: negative  Other  Erythema: absent Scars: absent Sensation: decreased Pulse: present  Comments:  Atrophy right thumb thenar eminence.       Specialty Comments:  No specialty comments available.  Imaging: No results found.   PMFS History: Patient Active Problem List   Diagnosis Date Noted  . Obesity (BMI 30-39.9) 10/31/2018  . CRI (chronic renal insufficiency), stage 3 (moderate) ([place]) 10/31/2018  . Iron deficiency anemia 04/10/2018  . 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 Abnormality of gait 08/05/2015  . Spinal stenosis of lumbar region 08/05/2015  . Peripheral neuropathy 08/05/2015  . Chest pain 12/28/2013  . Hyperlipidemia 04/05/2013  . Systemic lupus erythematosus ([place]) 04/05/2013  . Pulmonary embolism ([place]) 01/24/2012  . Acute on chronic diastolic (congestive) heart failure ([place]) 01/24/2012  . COPD (chronic obstructive pulmonary disease) ([place]) 01/24/2012  . Diabetes mellitus ([place]) 01/24/2012  . Hypertension 01/24/2012  . Leukocytosis 01/24/2012  . Bronchitis 01/24/2012  . Anemia 01/24/2012   Past Medical History:  Diagnosis Date  . Allergic rhinitis   . Anxiety   . Arthritis   .  Asthma   . Cancer [facility]) 2006   breast cancer right  . CHF (congestive heart failure) ([place])   . COPD (chronic obstructive pulmonary disease) ([place])   . Degenerative disc disease, lumbar   . Diabetes mellitus without complication ([place])   . Dyspnea    walking distances  . Eczema   . GERD (gastroesophageal reflux disease)   . Glaucoma   . History of home oxygen therapy    uses 2 liters ay night and prn  . Hyperlipidemia   . Hypertension   . Low back pain   . Lupus ([place])    skin  . Neck pain   . Numbness and tingling   . Obesity   . Osteopenia   . Pulmonary embolism ([place]) 01/2012    CT showed multi small PE and coumadized   . Sleep apnea 2010   no cpap used  . Systemic lupus erythematosus (HCC)     Family History  Problem Relation Age of Onset  . Heart failure Father   . Heart failure Mother   . Diabetes Brother   . Breast cancer Maternal Aunt   . Breast cancer Paternal Aunt     Past Surgical History:  Procedure Laterality Date  . ABDOMINAL HYSTERECTOMY  1976  . BACK SURGERY  2006   lower  . BREAST BIOPSY Right   . BREAST EXCISIONAL BIOPSY Left   . BREAST LUMPECTOMY Right   . COLONOSCOPY WITH PROPOFOL N/A 03/23/2015   Procedure: COLONOSCOPY WITH PROPOFOL;  Surgeon: Laurence Spates, MD;  Location: [facility];  Service: Endoscopy;  Laterality: N/A;  . Dobtamine myoview  05/02/2008   EF 67% ; LV norm  . DOPPLER ECHOCARDIOGRAPHY  01/25/2012   EF 55 TO 60%; LV norm.  . EXPLORATORY LAPAROTOMY    . EYE SURGERY Bilateral 2010   lens reaplcments for cataracts   . Lower Extrem. venous doppler  01/25/2012    neg.  . TOE SURGERY  1996   Bunion   Social History   Occupational History  . Occupation: Disabled  Tobacco Use  . Smoking status: Former Research scientist (life sciences)  . Smokeless tobacco: Never Used  . Tobacco comment: Quit 1980  Substance and Sexual Activity  . Alcohol use: No    Alcohol/week: 0.0 standard drinks  . Drug use: No  . Sexual activity: Not  on file

## 2018-12-07 ENCOUNTER — Telehealth: Payer: Self-pay | Admitting: *Deleted

## 2018-12-07 ENCOUNTER — Telehealth: Payer: Self-pay | Admitting: Hematology

## 2018-12-07 NOTE — Telephone Encounter (Signed)
Scheduled apt per 8/21 sch message - pt is aware of appt date and time

## 2018-12-07 NOTE — Telephone Encounter (Signed)
Requested follow up appt - states she had to cancel the one in the spring due to the virus. She states if she cannot be contacted, please contact her nurse Ky Barban [phone]. Message sent to scheduling

## 2018-12-10 ENCOUNTER — Telehealth: Payer: Self-pay

## 2018-12-10 NOTE — Telephone Encounter (Signed)
Pt.notified

## 2018-12-10 NOTE — Telephone Encounter (Signed)
   Primary Cardiologist: Quay Burow, MD  Chart reviewed as part of pre-operative protocol coverage. Patient was contacted 12/10/2018 in reference to pre-operative risk assessment for pending surgery as outlined below.  [name] [name] was last seen on 10/31/18 by Kerin Ransom PAC.  Since that day, [name] [name] has done well. She states her edema and SOB are at baseline. She tells me she can walk up a flight of stairs, but questions her breathing. She does not have a history of CAD or prior MI. She is mainly limited by her baseline COPD and DOE.   According to the RCRI, she is at 0.9% risk of a major cardiovascular complication during the perioperative period.   Therefore, based on ACC/AHA guidelines, the patient would be at acceptable risk for the planned procedure without further cardiovascular testing.   I will route this recommendation to the requesting party via Epic fax function and remove from pre-op pool.  Please call with questions.  Tami Lin [name] Parchment, PA 12/10/2018, 1:25 PM

## 2018-12-10 NOTE — Telephone Encounter (Signed)
   [facility] Pre-operative Risk Assessment    Request for surgical clearance:  1. What type of surgery is being performed? right open carpal tunnel release   2. When is this surgery scheduled? pending   3. What type of clearance is required (medical clearance vs. Pharmacy clearance to hold med vs. Both)? medical  4. Are there any medications that need to be held prior to surgery and how long? n/a  5. Practice name and name of physician performing surgery? [facility]  Dr Antionette Fairy   6. What is your office phone number? [phone]    7.   What is your office fax number? [place]  8.   Anesthesia type (None, local, MAC, general)? not listed   [name] [name], [place] 12/10/2018, 11:45 AM  _________________________________________________________________   (provider comments below)

## 2018-12-10 NOTE — Telephone Encounter (Signed)
[id number] WILL cb IF THEY DID NOT RECEIVE

## 2018-12-20 ENCOUNTER — Ambulatory Visit (INDEPENDENT_AMBULATORY_CARE_PROVIDER_SITE_OTHER): Payer: Medicare HMO

## 2018-12-20 ENCOUNTER — Encounter: Payer: Self-pay | Admitting: Specialist

## 2018-12-20 ENCOUNTER — Ambulatory Visit (INDEPENDENT_AMBULATORY_CARE_PROVIDER_SITE_OTHER): Payer: Medicare HMO | Admitting: Specialist

## 2018-12-20 VITALS — BP 146/75 | HR 89 | Ht 64.0 in | Wt 218.0 lb

## 2018-12-20 DIAGNOSIS — N183 Chronic kidney disease, stage 3 unspecified: Secondary | ICD-10-CM

## 2018-12-20 DIAGNOSIS — M79641 Pain in right hand: Secondary | ICD-10-CM

## 2018-12-20 DIAGNOSIS — M1A09X0 Idiopathic chronic gout, multiple sites, without tophus (tophi): Secondary | ICD-10-CM

## 2018-12-20 MED ORDER — COLCHICINE 0.6 MG PO TABS: 0.6000 mg | ORAL_TABLET | Freq: Two times a day (BID) | ORAL | 0 refills | Status: DC

## 2018-12-20 NOTE — Patient Instructions (Signed)
Elevated uric acid level indicates that some of the joint pain may be due to inflamation due to hyperuricemia or gout. Recommend antigout meds including colchicine and allopurinol and assessment of renal function. Patients with increased uric acid levels may have this due to poor kidney secretion of the uric acid into the urine due to  Decrease kidney function or due to increased levels of uric acid due to a hypermetablolic state that can be seen with occult tumor or increased protein intake. A thorough medical evaluation is recommended.  May use warm soaks 2-3 times a day, elevate right hand above the heart. Start colcrys twice a day for 3-4 days.  Call kidney specialist as you may need to start on different medications to control the gout.

## 2018-12-20 NOTE — Progress Notes (Signed)
Office Visit Note   Patient: [name] [name]           Date of Birth: [birth year]           MRN: [id number] Visit Date: 12/20/2018              Requested by: Nolene Ebbs, MD [address] [place],  [place] [address] PCP: Nolene Ebbs, MD   Assessment & Plan: Visit Diagnoses:  1. Pain of right hand   2. Idiopathic chronic gout of multiple sites without tophus   3. CRI (chronic renal insufficiency), stage 3 (moderate) (HCC)     Plan: Elevated uric acid level indicates that some of the joint pain may be due to inflamation due to hyperuricemia or gout. Recommend antigout meds including colchicine and allopurinol and assessment of renal function. Patients with increased uric acid levels may have this due to poor kidney secretion of the uric acid into the urine due to  Decrease kidney function or due to increased levels of uric acid due to a hypermetablolic state that can be seen with occult tumor or increased protein intake. A thorough medical evaluation is recommended.  May use warm soaks 2-3 times a day, elevate right hand above the heart. Start colcrys twice a day for 3-4 days.  Call kidney specialist as you may need to start on different medications to control the gout.   Follow-Up Instructions: Return in about 5 days (around 12/25/2018).   Orders:  Orders Placed This Encounter  Procedures  . XR Hand Complete Right   No orders of the defined types were placed in this encounter.     Procedures: No procedures performed   Clinical Data: No additional findings.   Subjective: Chief Complaint  Patient presents with  . Right Hand - Edema, Pain    78 year old right handed female with a 5 day history of right hand swelling. Began early AM Saturday  With swelling and pain in the right hand. She has had some fever and she stays cold. History of Stage 3 renal disease last Creatinine 1.6. She has had a history of gout affecting the left elbow. She has malodorous urine.    Review of Systems  Constitutional: Negative.   HENT: Negative.   Eyes: Negative.   Respiratory: Positive for cough and wheezing.   Cardiovascular: Positive for leg swelling. Negative for chest pain and palpitations.  Gastrointestinal: Negative.  Negative for abdominal distention, abdominal pain, anal bleeding, blood in stool, constipation, diarrhea, nausea and rectal pain.  Endocrine: Negative.  Negative for cold intolerance, heat intolerance, polydipsia and polyphagia.  Genitourinary: Negative.   Musculoskeletal: Positive for joint swelling. Negative for arthralgias, back pain, gait problem, myalgias, neck pain and neck stiffness.  Skin: Negative.   Allergic/Immunologic: Negative.   Neurological: Negative.   Hematological: Negative.   Psychiatric/Behavioral: Negative.      Objective: Vital Signs: BP (!) 146/75 (BP Location: Left Arm, Patient Position: Sitting)   Pulse 89   Ht '5\' 4"'$  (1.626 m)   Wt 218 lb (98.9 kg)   BMI 37.42 kg/m   Physical Exam  Ortho Exam  Specialty Comments:  No specialty comments available.  Imaging: Xr Hand Complete Right  Result Date: 12/20/2018 AP and lateral radiographs right hand no fracture or subluxation, soft tissue swelling of the right hand MCP 2-5.     PMFS History: Patient Active Problem List   Diagnosis Date Noted  . Obesity (BMI 30-39.9) 10/31/2018  . CRI (chronic renal insufficiency), stage  3 (moderate) ([place]) 10/31/2018  . Iron deficiency anemia 04/10/2018  . 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 Abnormality of gait 08/05/2015  . Spinal stenosis of lumbar region 08/05/2015  . Peripheral neuropathy 08/05/2015  . Chest pain 12/28/2013  . Hyperlipidemia 04/05/2013  . Systemic lupus erythematosus ([place]) 04/05/2013  . Pulmonary embolism ([place]) 01/24/2012  . Acute on chronic diastolic (congestive) heart failure ([place]) 01/24/2012  . COPD (chronic obstructive pulmonary disease) ([place]) 01/24/2012  . Diabetes mellitus ([place])  01/24/2012  . Hypertension 01/24/2012  . Leukocytosis 01/24/2012  . Bronchitis 01/24/2012  . Anemia 01/24/2012   Past Medical History:  Diagnosis Date  . Allergic rhinitis   . Anxiety   . Arthritis   . Asthma   . Cancer [facility]) 2006   breast cancer right  . CHF (congestive heart failure) ([place])   . COPD (chronic obstructive pulmonary disease) ([place])   . Degenerative disc disease, lumbar   . Diabetes mellitus without complication ([place])   . Dyspnea    walking distances  . Eczema   . GERD (gastroesophageal reflux disease)   . Glaucoma   . History of home oxygen therapy    uses 2 liters ay night and prn  . Hyperlipidemia   . Hypertension   . Low back pain   . Lupus ([place])    skin  . Neck pain   . Numbness and tingling   . Obesity   . Osteopenia   . Pulmonary embolism ([place]) 01/2012    CT showed multi small PE and coumadized   . Sleep apnea 2010   no cpap used  . Systemic lupus erythematosus (HCC)     Family History  Problem Relation Age of Onset  . Heart failure Father   . Heart failure Mother   . Diabetes Brother   . Breast cancer Maternal Aunt   . Breast cancer Paternal Aunt     Past Surgical History:  Procedure Laterality Date  . ABDOMINAL HYSTERECTOMY  1976  . BACK SURGERY  2006   lower  . BREAST BIOPSY Right   . BREAST EXCISIONAL BIOPSY Left   . BREAST LUMPECTOMY Right   . COLONOSCOPY WITH PROPOFOL N/A 03/23/2015   Procedure: COLONOSCOPY WITH PROPOFOL;  Surgeon: Laurence Spates, MD;  [facility];  Service: Endoscopy;  Laterality: N/A;  . Dobtamine myoview  05/02/2008   EF 67% ; LV norm  . DOPPLER ECHOCARDIOGRAPHY  01/25/2012   EF 55 TO 60%; LV norm.  . EXPLORATORY LAPAROTOMY    . EYE SURGERY Bilateral 2010   lens reaplcments for cataracts   . Lower Extrem. venous doppler  01/25/2012    neg.  . TOE SURGERY  1996   Bunion   Social History   Occupational History  . Occupation: Disabled  Tobacco Use  . Smoking status: Former Research scientist (life sciences)  .  Smokeless tobacco: Never Used  . Tobacco comment: Quit 1980  Substance and Sexual Activity  . Alcohol use: No    Alcohol/week: 0.0 standard drinks  . Drug use: No  . Sexual activity: Not on file

## 2018-12-21 LAB — CBC WITH DIFFERENTIAL/PLATELET
Absolute Monocytes: 449 cells/uL (ref 200–950)
Basophils Absolute: 51 cells/uL (ref 0–200)
Basophils Relative: 0.5 %
Eosinophils Absolute: 296 cells/uL (ref 15–500)
Eosinophils Relative: 2.9 %
HCT: 30.1 % — ABNORMAL LOW (ref 35.0–45.0)
Hemoglobin: 9.5 g/dL — ABNORMAL LOW (ref 11.7–15.5)
Lymphs Abs: 928 cells/uL (ref 850–3900)
MCH: 28.9 pg (ref 27.0–33.0)
MCHC: 31.6 g/dL — ABNORMAL LOW (ref 32.0–36.0)
MCV: 91.5 fL (ref 80.0–100.0)
MPV: 10.6 fL (ref 7.5–12.5)
Monocytes Relative: 4.4 %
Neutro Abs: 8476 cells/uL — ABNORMAL HIGH (ref 1500–7800)
Neutrophils Relative %: 83.1 %
Platelets: 273 10*3/uL (ref 140–400)
RBC: 3.29 10*6/uL — ABNORMAL LOW (ref 3.80–5.10)
RDW: 13 % (ref 11.0–15.0)
Total Lymphocyte: 9.1 %
WBC: 10.2 10*3/uL (ref 3.8–10.8)

## 2018-12-21 LAB — C-REACTIVE PROTEIN: CRP: 119.6 mg/L — ABNORMAL HIGH (ref <8.0)

## 2018-12-21 LAB — BASIC METABOLIC PANEL
BUN/Creatinine Ratio: 20 (calc) (ref 6–22)
BUN: 38 mg/dL — ABNORMAL HIGH (ref 7–25)
CO2: 20 mmol/L (ref 20–32)
Calcium: 9.5 mg/dL (ref 8.6–10.4)
Chloride: 108 mmol/L (ref 98–110)
Creat: 1.91 mg/dL — ABNORMAL HIGH (ref 0.60–0.93)
Glucose, Bld: 186 mg/dL — ABNORMAL HIGH (ref 65–99)
Potassium: 5.5 mmol/L — ABNORMAL HIGH (ref 3.5–5.3)
Sodium: 137 mmol/L (ref 135–146)

## 2018-12-21 LAB — URIC ACID: Uric Acid, Serum: 5.1 mg/dL (ref 2.5–7.0)

## 2018-12-21 LAB — SEDIMENTATION RATE: Sed Rate: 96 mm/h — ABNORMAL HIGH (ref 0–30)

## 2018-12-26 ENCOUNTER — Ambulatory Visit: Payer: Medicare HMO | Admitting: Specialist

## 2018-12-27 ENCOUNTER — Other Ambulatory Visit: Payer: Self-pay | Admitting: *Deleted

## 2018-12-27 DIAGNOSIS — D472 Monoclonal gammopathy: Secondary | ICD-10-CM

## 2018-12-27 DIAGNOSIS — C9000 Multiple myeloma not having achieved remission: Secondary | ICD-10-CM

## 2018-12-27 DIAGNOSIS — D509 Iron deficiency anemia, unspecified: Secondary | ICD-10-CM

## 2018-12-27 NOTE — Progress Notes (Signed)
HEMATOLOGY/ONCOLOGY CONSULTATION NOTE  Date of Service: 12/28/2018  Patient Care Team: Nolene Ebbs, MD as PCP - General (Internal Medicine) Lorretta Harp, MD as PCP - Cardiology (Cardiology)  CHIEF COMPLAINTS/PURPOSE OF CONSULTATION:  MGUS   HISTORY OF PRESENTING ILLNESS:  [name] [name] is a wonderful 78 y.o. female who has been previously followed by my colleague Dr. 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 She is accompanied today by her sister-in-law. The pt reports that she is doing well overall.   The pt reports that she has been having some blisters and open wounds related to her leg swelling, and is being followed by a wound clinic for this. She denies any other concerning symptoms or developments since she last saw Dr. Lebron Conners.   She had a PE two years ago and is no longer taking blood thinners. She has lupus and takes Plaquenil, and sees Dr. Sabino Niemann in rheumatology. She notes that she has fairly controlled diabetes and is followed by her PCP Dr.Edwin Avbuere regarding this. She also sees Dr. Quay Burow in cardiology. She also sees Dr. Basil Dess in orthopedics. She also sees [facility]. The pt also notes that she has gout attacks in her toes, for which she takes Allopurinol.   Of note since the patient's last visit, pt has had PET/CT completed on 05/18/17 with results revealing No FDG PET-CT evidence of active multiple myeloma.  Lab results (11/07/17) of CBC w/diff, CMP, and Reticulocytes is as follows: all values are WNL except for RBC at 3.52, HGB at 9.6, HCT at 30.2, RDW at 17.0, Glucose at 337, BUN at 30, Creatinine at 1.71, AST at 14, GFR at 32. MMP 11/07/17 revealed all values WNL except for Alpha2 Glob Ser at 1.1, M Protein at 0.7, Globulin total at 4.0.  Kappa/Lambda 11/07/17 revealed all values WNL except for Kappa at 39.2. Beta-2 microglobulin 11/07/17 was at 4.5 LDH 11/07/17 was at 266  On review of systems, pt reports stable energy  levels, occasional gout attacks in her toes, leg swelling, open wounds on left leg, and denies dizziness, light headedness, abdominal pains, new bone pains, and any other symptoms.    Interval History:   [name] [name] returns today for management and evaluation of her MGUS. The patient's last visit with [place] was on 04/20/2018. The pt reports that she is doing well overall.  The pt reports she recently had a UTI, for which she went to [facility] for and got some medication to treat.   Of note since the patient's last visit, pt has had a digital screening bilateral mammogram with tomography completed on 07/02/2018 with results revealing Breast Density Category C. There is no evidence of malignancy.   Lab results today (12/28/18) of CBC w/diff and CMP is as follows: all values are WNL except for RBC at 3.48, Hgb at 10.3, HCT at 32.2, Platelet at 461, Potassium at 5.3, CO2 at 18, Glucose at 176, BUN at 38, Creatinine at 1.88, Calcium at 10.4, Total Bilirubin at <0.2, GFR est non af am at 25, and GFR est afr am at 29.  On review of systems, pt reports recent UTI and denies any other symptoms.    MEDICAL HISTORY:  Past Medical History:  Diagnosis Date  . Allergic rhinitis   . Anxiety   . Arthritis   . Asthma   . Cancer [facility]) 2006   breast cancer right  . CHF (congestive heart failure) ([place])   . COPD (chronic  obstructive pulmonary disease) ([place])   . Degenerative disc disease, lumbar   . Diabetes mellitus without complication ([place])   . Dyspnea    walking distances  . Eczema   . GERD (gastroesophageal reflux disease)   . Glaucoma   . History of home oxygen therapy    uses 2 liters ay night and prn  . Hyperlipidemia   . Hypertension   . Low back pain   . Lupus ([place])    skin  . Neck pain   . Numbness and tingling   . Obesity   . Osteopenia   . Pulmonary embolism ([place]) 01/2012    CT showed multi small PE and coumadized   . Sleep apnea 2010   no cpap used  . Systemic lupus  erythematosus ([place])     SURGICAL HISTORY: Past Surgical History:  Procedure Laterality Date  . ABDOMINAL HYSTERECTOMY  1976  . BACK SURGERY  2006   lower  . BREAST BIOPSY Right   . BREAST EXCISIONAL BIOPSY Left   . BREAST LUMPECTOMY Right   . COLONOSCOPY WITH PROPOFOL N/A 03/23/2015   Procedure: COLONOSCOPY WITH PROPOFOL;  Surgeon: Laurence Spates, MD;  Location: [facility];  Service: Endoscopy;  Laterality: N/A;  . Dobtamine myoview  05/02/2008   EF 67% ; LV norm  . DOPPLER ECHOCARDIOGRAPHY  01/25/2012   EF 55 TO 60%; LV norm.  . EXPLORATORY LAPAROTOMY    . EYE SURGERY Bilateral 2010   lens reaplcments for cataracts   . Lower Extrem. venous doppler  01/25/2012    neg.  . TOE SURGERY  1996   Bunion    SOCIAL HISTORY: Social History   Socioeconomic History  . Marital status: Single    Spouse name: Not on file  . Number of children: 5  . Years of education: 30  . Highest education level: Not on file  Occupational History  . Occupation: Disabled  Social Needs  . Financial resource strain: Not on file  . Food insecurity    Worry: Not on file    Inability: Not on file  . Transportation needs    Medical: Not on file    Non-medical: Not on file  Tobacco Use  . Smoking status: Former Research scientist (life sciences)  . Smokeless tobacco: Never Used  . Tobacco comment: Quit 1980  Substance and Sexual Activity  . Alcohol use: No    Alcohol/week: 0.0 standard drinks  . Drug use: No  . Sexual activity: Not on file  Lifestyle  . Physical activity    Days per week: Not on file    Minutes per session: Not on file  . Stress: Not on file  Relationships  . Social Herbalist on phone: Not on file    Gets together: Not on file    Attends religious service: Not on file    Active member of club or organization: Not on file    Attends meetings of clubs or organizations: Not on file    Relationship status: Not on file  . Intimate partner violence    Fear of current or ex partner: Not on  file    Emotionally abused: Not on file    Physically abused: Not on file    Forced sexual activity: Not on file  Other Topics Concern  . Not on file  Social History Narrative   Lives at home alone.   Right-handed.   2-4 cups caffeine daily.    FAMILY HISTORY: Family History  Problem Relation  Age of Onset  . Heart failure Father   . Heart failure Mother   . Diabetes Brother   . Breast cancer Maternal Aunt   . Breast cancer Paternal Aunt     ALLERGIES:  is allergic to penicillins; fentanyl; peach [prunus persica]; and shellfish allergy.  MEDICATIONS:  Current Outpatient Medications  Medication Sig Dispense Refill  . acetaminophen (TYLENOL) 650 MG CR tablet Take 1,300 mg by mouth every 8 (eight) hours as needed for pain.    Marland Kitchen albuterol (PROVENTIL HFA;VENTOLIN HFA) 108 (90 BASE) MCG/ACT inhaler Inhale 2 puffs into the lungs See admin instructions. Every 6 hours as needed for SOB but also takes 2 puffs BID scheduled.    Marland Kitchen albuterol (PROVENTIL) (2.5 MG/3ML) 0.083% nebulizer solution Take 2.5 mg by nebulization every 6 (six) hours as needed for wheezing or shortness of breath.     Marland Kitchen alendronate (FOSAMAX) 70 MG tablet Take 70 mg by mouth every Monday. Take with a full glass of water on an empty stomach.    Marland Kitchen allopurinol (ZYLOPRIM) 100 MG tablet Take 100 mg by mouth daily.    Marland Kitchen aspirin 325 MG tablet Take 325 mg by mouth daily.    . brimonidine (ALPHAGAN P) 0.1 % SOLN Place 1 drop into both eyes 2 (two) times a day.    . budesonide (PULMICORT) 0.5 MG/2ML nebulizer solution Take 0.5 mg by nebulization every 6 (six) hours as needed (For wheezing.).     Marland Kitchen calcium carbonate (OS-CAL) 1250 (500 Ca) MG chewable tablet Chew 1 tablet by mouth daily.    . cetirizine (ZYRTEC) 10 MG tablet Take 10 mg by mouth daily.    . cholecalciferol (VITAMIN D) 1000 UNITS tablet Take 1,000 Units by mouth daily.    . citalopram (CELEXA) 10 MG tablet Take 10 mg by mouth daily.     . clobetasol ointment (TEMOVATE)  0.73 % Apply 1 application topically 2 (two) times daily.     . colchicine 0.6 MG tablet Take 1 tablet (0.6 mg total) by mouth 2 (two) times daily. 10 tablet 0  . desonide (DESOWEN) 0.05 % cream Apply 1 application topically 2 (two) times daily.     Marland Kitchen dexlansoprazole (DEXILANT) 60 MG capsule Take 60 mg by mouth daily.     . diclofenac sodium (VOLTAREN) 1 % GEL Apply 2 g topically daily. Reported on 09/15/2015    . diltiazem (CARTIA XT) 300 MG 24 hr capsule Take 300 mg by mouth daily. Reported on 09/15/2015    . diphenhydrAMINE (BENADRYL) 25 MG tablet Take 25 mg by mouth every 6 (six) hours as needed for itching.     . furosemide (LASIX) 40 MG tablet Take 1 tablet twice a day for 3 days then 1 tablet once day 30 tablet 0  . gabapentin (NEURONTIN) 300 MG capsule Take 1 capsule (300 mg total) by mouth 2 (two) times daily. 60 capsule 6  . hydrALAZINE (APRESOLINE) 50 MG tablet Take 50 mg by mouth 3 (three) times daily.     . hydroxychloroquine (PLAQUENIL) 200 MG tablet Take 200 mg by mouth daily.     . insulin aspart (NOVOLOG) 100 UNIT/ML injection Inject 10 Units into the skin 3 (three) times daily before meals.     . insulin glargine (LANTUS) 100 UNIT/ML injection Inject 60-70 Units into the skin 2 (two) times daily. She takes 60 units in the morning and 70 units at bedtime.    . isosorbide mononitrate (IMDUR) 30 MG 24 hr tablet Take 30  mg by mouth every morning.    Marland Kitchen ketotifen (ALAWAY) 0.025 % ophthalmic solution Place 1 drop into both eyes daily.     Marland Kitchen lisinopril-hydrochlorothiazide (ZESTORETIC) 20-12.5 MG tablet     . LORazepam (ATIVAN) 0.5 MG tablet Take 1 tablet (0.5 mg total) by mouth every 4 (four) hours as needed for anxiety (For anxiety with PET-CT or MRI imaging). 25 tablet 0  . metoprolol tartrate (LOPRESSOR) 50 MG tablet Take 1 tablet (50 mg total) by mouth 2 (two) times daily. 60 tablet 2  . oxybutynin (DITROPAN-XL) 10 MG 24 hr tablet Take 10 mg by mouth daily.    . OXYGEN Inhale into the  lungs. 2L/min - prn during day and every evening.    . pravastatin (PRAVACHOL) 40 MG tablet Take 40 mg by mouth at bedtime.     . SYMBICORT 160-4.5 MCG/ACT inhaler Inhale 2 puffs into the lungs 2 (two) times daily. Reported on 09/15/2015    . tizanidine (ZANAFLEX) 2 MG capsule Take 2 mg by mouth 2 (two) times daily.    . traMADol (ULTRAM) 50 MG tablet Take 1 tablet (50 mg total) by mouth every 6 (six) hours as needed for severe pain. 60 tablet 0  . trimethoprim (TRIMPEX) 100 MG tablet Take 100 mg by mouth daily.    . vitamin B-12 (CYANOCOBALAMIN) 1000 MCG tablet Take 1,000 mcg by mouth daily. Reported on 09/15/2015     No current facility-administered medications for this visit.     REVIEW OF SYSTEMS:   A 10+ POINT REVIEW OF SYSTEMS WAS OBTAINED including neurology, dermatology, psychiatry, cardiac, respiratory, lymph, extremities, GI, GU, Musculoskeletal, constitutional, breasts, reproductive, HEENT.  All pertinent positives are noted in the HPI.  All others are negative.    PHYSICAL EXAMINATION: ECOG FS:0 - Asymptomatic  Vitals:   12/28/18 1123  BP: (!) 154/69  Pulse: 89  Resp: 18  Temp: 98.5 F (36.9 C)  SpO2: 94%   Wt Readings from Last 3 Encounters:  12/28/18 225 lb 4.8 oz (102.2 kg)  12/20/18 218 lb (98.9 kg)  11/29/18 218 lb (98.9 kg)   Body mass index is 38.67 kg/m.    GENERAL:alert, in no acute distress and comfortable SKIN: no acute rashes, no significant lesions EYES: conjunctiva are pink and non-injected, sclera anicteric OROPHARYNX: MMM, no exudates, no oropharyngeal erythema or ulceration NECK: supple, no JVD LYMPH:  no palpable lymphadenopathy in the cervical, axillary or inguinal regions LUNGS: clear to auscultation b/l with normal respiratory effort HEART: regular rate & rhythm ABDOMEN:  normoactive bowel sounds , non tender, not distended. Extremity: 1+ pedal edema PSYCH: alert & oriented x 3 with fluent speech NEURO: no focal motor/sensory deficits    LABORATORY DATA:  I have reviewed the data as listed  . CBC Latest Ref Rng & Units 12/28/2018 12/20/2018 04/10/2018  WBC 4.0 - 10.5 K/uL 7.9 10.2 7.5  Hemoglobin 12.0 - 15.0 g/dL 10.3(L) 9.5(L) 10.3(L)  Hematocrit 36.0 - 46.0 % 32.2(L) 30.1(L) 33.3(L)  Platelets 150 - 400 K/uL 461(H) 273 279   . CBC    Component Value Date/Time   WBC 7.9 12/28/2018 1104   WBC 10.2 12/20/2018 1125   RBC 3.48 (L) 12/28/2018 1104   HGB 10.3 (L) 12/28/2018 1104   HGB 10.9 (L) 03/15/2017 1202   HCT 32.2 (L) 12/28/2018 1104   HCT 33.1 (L) 03/15/2017 1202   PLT 461 (H) 12/28/2018 1104   PLT 316 03/15/2017 1202   MCV 92.5 12/28/2018 1104   MCV 86.4  03/15/2017 1202   MCH 29.6 12/28/2018 1104   MCHC 32.0 12/28/2018 1104   RDW 14.0 12/28/2018 1104   RDW 15.0 (H) 03/15/2017 1202   LYMPHSABS 1.4 12/28/2018 1104   LYMPHSABS 1.4 03/15/2017 1202   MONOABS 0.5 12/28/2018 1104   MONOABS 0.5 03/15/2017 1202   EOSABS 0.3 12/28/2018 1104   EOSABS 0.0 03/15/2017 1202   BASOSABS 0.0 12/28/2018 1104   BASOSABS 0.1 03/15/2017 1202    CMP Latest Ref Rng & Units 12/28/2018 12/20/2018 11/12/2018  Glucose 70 - 99 mg/dL 176(H) 186(H) 98  BUN 8 - 23 mg/dL 38(H) 38(H) 22  Creatinine 0.44 - 1.00 mg/dL 1.88(H) 1.91(H) 1.60(H)  Sodium 135 - 145 mmol/L 136 137 138  Potassium 3.5 - 5.1 mmol/L 5.3(H) 5.5(H) 5.0  Chloride 98 - 111 mmol/L 111 108 110(H)  CO2 22 - 32 mmol/L 18(L) 20 15(L)  Calcium 8.9 - 10.3 mg/dL 10.4(H) 9.5 10.3  Total Protein 6.5 - 8.1 g/dL 7.7 - -  Total Bilirubin 0.3 - 1.2 mg/dL <0.2(L) - -  Alkaline Phos 38 - 126 U/L 101 - -  AST 15 - 41 U/L 21 - -  ALT 0 - 44 U/L 15 - -   04/06/17 BM Bx:   04/04/17 Cytogenetics:    RADIOGRAPHIC STUDIES: I have personally reviewed the radiological images as listed and agreed with the findings in the report. Xr Hand Complete Right  Result Date: 12/20/2018 AP and lateral radiographs right hand no fracture or subluxation, soft tissue swelling of the right hand  MCP 2-5.    ASSESSMENT & PLAN:   78 y.o. female with lupus, CKD, Diabetes Mellitus type II, gout and   1. MGUS (no smoldering myeloma despite > 10% plasma cells in the BM Bx since a large extent of those appeared to be reactive) -Discussed pt labwork from 11/07/17; HGB at 9.6, M Protein at 0.7 (no significant change from previous M spike of 0.6)  2. Anemia due to CKD? And anemia of chronic inflammation related to lupus/leg ulcers.  3. Patient Active Problem List   Diagnosis Date Noted  . Obesity (BMI 30-39.9) 10/31/2018  . CRI (chronic renal insufficiency), stage 3 (moderate) ([place]) 10/31/2018  . Iron deficiency anemia 04/10/2018  . 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 Abnormality of gait 08/05/2015  . Spinal stenosis of lumbar region 08/05/2015  . Peripheral neuropathy 08/05/2015  . Chest pain 12/28/2013  . Hyperlipidemia 04/05/2013  . Systemic lupus erythematosus ([place]) 04/05/2013  . Pulmonary embolism ([place]) 01/24/2012  . Acute on chronic diastolic (congestive) heart failure ([place]) 01/24/2012  . COPD (chronic obstructive pulmonary disease) ([place]) 01/24/2012  . Diabetes mellitus ([place]) 01/24/2012  . Hypertension 01/24/2012  . Leukocytosis 01/24/2012  . Bronchitis 01/24/2012  . Anemia 01/24/2012    PLAN: -Discussed pt labwork today, 12/28/18; all values are WNL except for RBC at 3.48, Hgb at 10.3, HCT at 32.2, Platelet at 461, Potassium at 5.3, CO2 at 18, Glucose at 176, BUN at 38, Creatinine at 1.88, Calcium at 10.4, Total Bilirubin at <0.2, GFR est non af am at 25, and GFR est afr am at 29. -Patient would prefer a phone follow up, which I am agreeable with; she will obtain labs 1 week prior -MGUS likely related to her SLE; repeat MMP before next visit. -No indication for erethropoyitin at this time.  -Phone call visit in 6 months  FOLLOW UP: RTC with Dr Irene Limbo with labs in 6 months   The total time spent in the appt  was 24mnutes and more than 50% was on counseling  and direct patient cares.  All of the patient's questions were answered with apparent satisfaction. The patient knows to call the clinic with any problems, questions or concerns.     GSullivan LoneMD MS AAHIVMS SEncompass Health Rehabilitation Hospital Of Altamonte SpringsCAkron Surgical Associates LLCHematology/Oncology Physician CCalifornia Pacific [facility] (Office):       [phone](Work cell):  [phone](Fax):           [phone] 12/28/2018 1:11 PM  I, AJacqualyn Posey am acting as a sEducation administratorfor Dr. GSullivan Lone   .I have reviewed the above documentation for accuracy and completeness, and I agree with the above. .Brunetta GeneraMD

## 2018-12-28 ENCOUNTER — Telehealth: Payer: Self-pay | Admitting: Radiology

## 2018-12-28 ENCOUNTER — Other Ambulatory Visit: Payer: Self-pay | Admitting: Specialist

## 2018-12-28 ENCOUNTER — Telehealth: Payer: Self-pay | Admitting: Hematology

## 2018-12-28 ENCOUNTER — Other Ambulatory Visit: Payer: Self-pay

## 2018-12-28 ENCOUNTER — Inpatient Hospital Stay (HOSPITAL_BASED_OUTPATIENT_CLINIC_OR_DEPARTMENT_OTHER): Payer: Medicare HMO | Admitting: Hematology

## 2018-12-28 ENCOUNTER — Inpatient Hospital Stay: Payer: Medicare HMO | Attending: Hematology

## 2018-12-28 VITALS — BP 154/69 | HR 89 | Temp 98.5°F | Resp 18 | Ht 64.0 in | Wt 225.3 lb

## 2018-12-28 DIAGNOSIS — E785 Hyperlipidemia, unspecified: Secondary | ICD-10-CM | POA: Diagnosis not present

## 2018-12-28 DIAGNOSIS — D509 Iron deficiency anemia, unspecified: Secondary | ICD-10-CM

## 2018-12-28 DIAGNOSIS — D472 Monoclonal gammopathy: Secondary | ICD-10-CM | POA: Insufficient documentation

## 2018-12-28 DIAGNOSIS — Z7982 Long term (current) use of aspirin: Secondary | ICD-10-CM | POA: Insufficient documentation

## 2018-12-28 DIAGNOSIS — Z87891 Personal history of nicotine dependence: Secondary | ICD-10-CM | POA: Diagnosis not present

## 2018-12-28 DIAGNOSIS — M109 Gout, unspecified: Secondary | ICD-10-CM | POA: Insufficient documentation

## 2018-12-28 DIAGNOSIS — C9000 Multiple myeloma not having achieved remission: Secondary | ICD-10-CM

## 2018-12-28 DIAGNOSIS — E119 Type 2 diabetes mellitus without complications: Secondary | ICD-10-CM | POA: Insufficient documentation

## 2018-12-28 DIAGNOSIS — D649 Anemia, unspecified: Secondary | ICD-10-CM

## 2018-12-28 DIAGNOSIS — Z794 Long term (current) use of insulin: Secondary | ICD-10-CM | POA: Diagnosis not present

## 2018-12-28 LAB — IRON AND TIBC
Iron: 42 ug/dL (ref 41–142)
Saturation Ratios: 19 % — ABNORMAL LOW (ref 21–57)
TIBC: 220 ug/dL — ABNORMAL LOW (ref 236–444)
UIBC: 178 ug/dL (ref 120–384)

## 2018-12-28 LAB — CMP (CANCER CENTER ONLY)
ALT: 15 U/L (ref 0–44)
AST: 21 U/L (ref 15–41)
Albumin: 3.7 g/dL (ref 3.5–5.0)
Alkaline Phosphatase: 101 U/L (ref 38–126)
Anion gap: 7 (ref 5–15)
BUN: 38 mg/dL — ABNORMAL HIGH (ref 8–23)
CO2: 18 mmol/L — ABNORMAL LOW (ref 22–32)
Calcium: 10.4 mg/dL — ABNORMAL HIGH (ref 8.9–10.3)
Chloride: 111 mmol/L (ref 98–111)
Creatinine: 1.88 mg/dL — ABNORMAL HIGH (ref 0.44–1.00)
GFR, Est AFR Am: 29 mL/min — ABNORMAL LOW (ref >60)
GFR, Estimated: 25 mL/min — ABNORMAL LOW (ref >60)
Glucose, Bld: 176 mg/dL — ABNORMAL HIGH (ref 70–99)
Potassium: 5.3 mmol/L — ABNORMAL HIGH (ref 3.5–5.1)
Sodium: 136 mmol/L (ref 135–145)
Total Bilirubin: <0.2 mg/dL — ABNORMAL LOW (ref 0.3–1.2)
Total Protein: 7.7 g/dL (ref 6.5–8.1)

## 2018-12-28 LAB — CBC WITH DIFFERENTIAL (CANCER CENTER ONLY)
Abs Immature Granulocytes: 0.03 10*3/uL (ref 0.00–0.07)
Basophils Absolute: 0 10*3/uL (ref 0.0–0.1)
Basophils Relative: 1 %
Eosinophils Absolute: 0.3 10*3/uL (ref 0.0–0.5)
Eosinophils Relative: 4 %
HCT: 32.2 % — ABNORMAL LOW (ref 36.0–46.0)
Hemoglobin: 10.3 g/dL — ABNORMAL LOW (ref 12.0–15.0)
Immature Granulocytes: 0 %
Lymphocytes Relative: 18 %
Lymphs Abs: 1.4 10*3/uL (ref 0.7–4.0)
MCH: 29.6 pg (ref 26.0–34.0)
MCHC: 32 g/dL (ref 30.0–36.0)
MCV: 92.5 fL (ref 80.0–100.0)
Monocytes Absolute: 0.5 10*3/uL (ref 0.1–1.0)
Monocytes Relative: 6 %
Neutro Abs: 5.7 10*3/uL (ref 1.7–7.7)
Neutrophils Relative %: 71 %
Platelet Count: 461 10*3/uL — ABNORMAL HIGH (ref 150–400)
RBC: 3.48 MIL/uL — ABNORMAL LOW (ref 3.87–5.11)
RDW: 14 % (ref 11.5–15.5)
WBC Count: 7.9 10*3/uL (ref 4.0–10.5)
nRBC: 0 % (ref 0.0–0.2)

## 2018-12-28 LAB — SAMPLE TO BLOOD BANK

## 2018-12-28 LAB — FERRITIN: Ferritin: 331 ng/mL — ABNORMAL HIGH (ref 11–307)

## 2018-12-28 NOTE — Telephone Encounter (Signed)
I tried to call patient to give her info from Dr. Louanne Skye, that she could have an infection< but she did not answer the phone. Dr. Louanne Skye has lmom on her VM.

## 2018-12-28 NOTE — Telephone Encounter (Signed)
Scheduled appt per 9/11 los.  Left a voice message of appt date and time,

## 2019-01-16 ENCOUNTER — Other Ambulatory Visit: Payer: Self-pay | Admitting: Medical

## 2019-01-25 ENCOUNTER — Ambulatory Visit (INDEPENDENT_AMBULATORY_CARE_PROVIDER_SITE_OTHER): Payer: Medicare HMO | Admitting: Specialist

## 2019-01-25 ENCOUNTER — Encounter: Payer: Self-pay | Admitting: Specialist

## 2019-01-25 ENCOUNTER — Telehealth: Payer: Self-pay | Admitting: Radiology

## 2019-01-25 ENCOUNTER — Other Ambulatory Visit: Payer: Self-pay

## 2019-01-25 ENCOUNTER — Ambulatory Visit (HOSPITAL_COMMUNITY)
Admission: RE | Admit: 2019-01-25 | Discharge: 2019-01-25 | Disposition: A | Discharge status: Home / Self Care | Payer: Medicare HMO | Source: Ambulatory Visit | Attending: Family | Admitting: Family

## 2019-01-25 ENCOUNTER — Telehealth (HOSPITAL_COMMUNITY): Payer: Self-pay | Admitting: *Deleted

## 2019-01-25 ENCOUNTER — Ambulatory Visit: Payer: Self-pay

## 2019-01-25 VITALS — BP 161/78 | HR 89 | Ht 64.0 in | Wt 218.0 lb

## 2019-01-25 DIAGNOSIS — M7989 Other specified soft tissue disorders: Secondary | ICD-10-CM

## 2019-01-25 DIAGNOSIS — M79644 Pain in right finger(s): Secondary | ICD-10-CM

## 2019-01-25 DIAGNOSIS — M19041 Primary osteoarthritis, right hand: Secondary | ICD-10-CM

## 2019-01-25 DIAGNOSIS — M79662 Pain in left lower leg: Secondary | ICD-10-CM

## 2019-01-25 NOTE — Telephone Encounter (Signed)
I left message on patients voicemail notifying of results for negative DVT per Jeneen Rinks, and that he wants her to follow up with her primary medical doctor to evaluate the fluid on her leg.    Jeneen Rinks also requested I follow up with patients aide, I called a number listed in chart for a Darra Lis, however no voicemail was set up and did not leave message.

## 2019-01-25 NOTE — Telephone Encounter (Signed)
Helene from [facility] called patient is negative for DVT, veins are pulsatile

## 2019-01-25 NOTE — Progress Notes (Signed)
Subjective: She is here for right thumb MCP injection under ultrasound guidance.  Objective: Tender to palpation around the MCP joint, slight bogginess.  No warmth or erythema.  Procedure: Ultrasound-guided right thumb injection: After sterile prep with Betadine, injected 1 cc 1% lidocaine without epinephrine and 20 mg methylprednisolone into the MCP joint.  Injectate was seen filling the joint capsule.  She will follow-up as directed.

## 2019-01-25 NOTE — Progress Notes (Signed)
Office Visit Note   Patient: [name] [name]           Date of Birth: 1940-07-04           MRN: [id number] Visit Date: 01/25/2019              Requested by: Nolene Ebbs, MD [address] [place],  [place] [address] PCP: Nolene Ebbs, MD   Assessment & Plan: Visit Diagnoses:  1. Thumb pain, right   2. 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 Pain of left calf   4. Left leg swelling     Plan: With patient's increase left lower extremity swelling and left calf pain over the last week I recommend getting a stat venous Doppler to rule out DVT.  Patient does have a history of PE which required hospital admission 2013.  Will await callback report.  Patient does have a history of polyarthralgia.  Followed by rheumatologist with history of lupus and gout.  With her right thumb MCP joint pain I asked Dr. Junius Roads to perform an ultrasound-guided MCP Marcaine/Depo-Medrol injection.  She also did have some tenderness at the right thumb A1 pulley and I may consider an injection there when she returns to see me in a couple weeks.  Follow-Up Instructions: Return in about 2 weeks (around 02/08/2019) for with Yazleen Molock.   Orders:  Orders Placed This Encounter  Procedures  . VAS [place] LOWER EXTREMITY VENOUS (DVT)   No orders of the defined types were placed in this encounter.     Procedures: No procedures performed   Clinical Data: No additional findings.   Subjective: Chief Complaint  Patient presents with  . Right Hand - Follow-up    HPI 78 year old black female returns for evaluation of right hand pain.  She continues to have some pain and swelling more at the thumb MCP joint and also over the index finger MCP joint as well.  She is followed by rheumatologist for history of lupus.  She was recently seen by him.  Has had index MCP joint injection by him in the past with some good temporary relief.  Patient also has new complaint today of worsening left lower leg  swelling and calf pain.  This is been more bothersome over the last week.  No recent travel.  Patient has a previous history of PE in 2013 which required hospital admission.  Also has history of COPD but denies any acute worsening of her shortness of breath.  Patient also comes in with persistent that stays with her 7 days a week and she also does not report any changes in her breathing.  Denies chest pain.   Objective: Vital Signs: BP (!) 161/78 (BP Location: Left Arm, Patient Position: Sitting)   Pulse 89   Ht _0  (1.626 m)   Wt 218 lb (98.9 kg)   BMI 37.42 kg/m   Physical Exam Constitutional:      Comments: Extremely pleasant black female alert and oriented in no acute distress.  Pulmonary:     Effort: No respiratory distress.  Musculoskeletal:     Comments: Patient ambulates well with a walker.  Exam right hand she does have moderate tenderness over the thumb MCP joint.  She also has some tenderness over the right thumb A1 pulley where there is a small nodule.  I did not feel any triggering.  Some swelling around the joint.  She is less tender over the index finger MCP joint.  She has  moderate to marked left calf tenderness.  Considerable swelling left lower extremity.  She does have some venous stasis blisters anterior tibia.  No gross signs of infection.  Left calf is firm.  Right calf nontender.  Minimal swelling.  Neurological:     Mental Status: She is alert.     Ortho Exam  Specialty Comments:  No specialty comments available.  Imaging: No results found.   PMFS History: Patient Active Problem List   Diagnosis Date Noted  . Obesity (BMI 30-39.9) 10/31/2018  . CRI (chronic renal insufficiency), stage 3 (moderate) 10/31/2018  . Iron deficiency anemia 04/10/2018  . 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 Abnormality of gait 08/05/2015  . Spinal stenosis of lumbar region 08/05/2015  . Peripheral neuropathy 08/05/2015  . Chest pain 12/28/2013  . Hyperlipidemia  04/05/2013  . Systemic lupus erythematosus ([place]) 04/05/2013  . Pulmonary embolism ([place]) 01/24/2012  . Acute on chronic diastolic (congestive) heart failure ([place]) 01/24/2012  . COPD (chronic obstructive pulmonary disease) ([place]) 01/24/2012  . Diabetes mellitus ([place]) 01/24/2012  . Hypertension 01/24/2012  . Leukocytosis 01/24/2012  . Bronchitis 01/24/2012  . Anemia 01/24/2012   Past Medical History:  Diagnosis Date  . Allergic rhinitis   . Anxiety   . Arthritis   . Asthma   . Cancer [facility]) 2006   breast cancer right  . CHF (congestive heart failure) ([place])   . COPD (chronic obstructive pulmonary disease) ([place])   . Degenerative disc disease, lumbar   . Diabetes mellitus without complication ([place])   . Dyspnea    walking distances  . Eczema   . GERD (gastroesophageal reflux disease)   . Glaucoma   . History of home oxygen therapy    uses 2 liters ay night and prn  . Hyperlipidemia   . Hypertension   . Low back pain   . Lupus ([place])    skin  . Neck pain   . Numbness and tingling   . Obesity   . Osteopenia   . Pulmonary embolism ([place]) 01/2012    CT showed multi small PE and coumadized   . Sleep apnea 2010   no cpap used  . Systemic lupus erythematosus ([facility])     Family History  Problem Relation Age of Onset  . Heart failure Father   . Heart failure Mother   . Diabetes Brother   . Breast cancer Maternal Aunt   . Breast cancer Paternal Aunt     Past Surgical History:  Procedure Laterality Date  . ABDOMINAL HYSTERECTOMY  1976  . BACK SURGERY  2006   lower  . BREAST BIOPSY Right   . BREAST EXCISIONAL BIOPSY Left   . BREAST LUMPECTOMY Right   . COLONOSCOPY WITH PROPOFOL N/A 03/23/2015   Procedure: COLONOSCOPY WITH PROPOFOL;  Surgeon: Laurence Spates, MD;  [facility];  Service: Endoscopy;  Laterality: N/A;  . Dobtamine myoview  05/02/2008   EF 67% ; LV norm  . DOPPLER ECHOCARDIOGRAPHY  01/25/2012   EF 55 TO 60%; LV norm.  . EXPLORATORY LAPAROTOMY    . EYE  SURGERY Bilateral 2010   lens reaplcments for cataracts   . Lower Extrem. venous doppler  01/25/2012    neg.  . TOE SURGERY  1996   Bunion   Social History   Occupational History  . Occupation: Disabled  Tobacco Use  . Smoking status: Former Research scientist (life sciences)  . Smokeless tobacco: Never Used  . Tobacco comment: Quit 1980  Substance and Sexual Activity  .  Alcohol use: No    Alcohol/week: 0.0 standard drinks  . Drug use: No  . Sexual activity: Not on file

## 2019-01-25 NOTE — Addendum Note (Signed)
Addended by: Hortencia Pilar on: 01/25/2019 11:25 AM   Modules accepted: Orders

## 2019-01-25 NOTE — Telephone Encounter (Signed)
The above patient or their representative was contacted and gave the following answers to these questions:         Do you have any of the following symptoms?    NO  Fever                    Cough                   Shortness of breath  Do  you have any of the following other symptoms?    muscle pain         vomiting,        diarrhea        rash         weakness        red eye        abdominal pain         bruising          bruising or bleeding              joint pain           severe headache    Have you been in contact with someone who was or has been sick in the past 2 weeks?  NO  Yes                 Unsure                         Unable to assess   Does the person that you were in contact with have any of the following symptoms?   Cough         shortness of breath           muscle pain         vomiting,            diarrhea            rash            weakness           fever            red eye           abdominal pain           bruising  or  bleeding                joint pain                severe headache                 COMMENTS OR ACTION PLAN FOR THIS PATIENT:   NO TO ALL PER [facility]

## 2019-01-28 NOTE — Telephone Encounter (Signed)
Today I had a phone conversation with Leisa Lenz (patient's assistant) in regards to left lower extremity venous Doppler study that I ordered last week.  I did get callback report from the vascular lab and this was negative for DVT.  Clinical assistant from our office did reach out to 1 of the contact numbers listed in patient's chart.  I advised Anderson Malta to make sure that patient gets in contact with her primary care physician to evaluate the lower extremity swelling and to see if there needs to be adjustments made to her medications.  She may also need appointment with kidney specialist.  Advised that the venous stasis blisters that she has could potentially worsen and leads to infection if the swelling is not under control.  Patient is still at risk for DVT.  Patient will follow-up with me next week as scheduled for her hand pain.

## 2019-01-28 NOTE — Telephone Encounter (Signed)
noted 

## 2019-01-28 NOTE — Telephone Encounter (Signed)
I called patient's assistant Anderson Malta this morning and also spoke to her regarding the venous Doppler study.  No is dictated and attached to clinical assistant note.

## 2019-02-01 ENCOUNTER — Telehealth: Payer: Self-pay | Admitting: Cardiovascular Disease

## 2019-02-01 NOTE — Telephone Encounter (Signed)
Called patient to inform her that Roby Lofts was not going to be in the office on 02-04-19.  Zigmund Daniel gave me permission to add her to Dr. Kennon Holter scheduled on 02-05-19.  Pt to call back and see if she will have transportation and get to the appointment on Tuesday, 02-05-2019.

## 2019-02-04 ENCOUNTER — Other Ambulatory Visit: Payer: Self-pay

## 2019-02-04 ENCOUNTER — Ambulatory Visit: Payer: Medicare HMO | Admitting: Medical

## 2019-02-04 ENCOUNTER — Ambulatory Visit (INDEPENDENT_AMBULATORY_CARE_PROVIDER_SITE_OTHER): Payer: Medicare HMO | Admitting: Cardiovascular Disease

## 2019-02-04 ENCOUNTER — Encounter: Payer: Self-pay | Admitting: Cardiovascular Disease

## 2019-02-04 DIAGNOSIS — E782 Mixed hyperlipidemia: Secondary | ICD-10-CM | POA: Diagnosis not present

## 2019-02-04 DIAGNOSIS — I5033 Acute on chronic diastolic (congestive) heart failure: Secondary | ICD-10-CM | POA: Diagnosis not present

## 2019-02-04 MED ORDER — FUROSEMIDE 80 MG PO TABS: 80.0000 mg | ORAL_TABLET | Freq: Two times a day (BID) | ORAL | 3 refills | Status: DC

## 2019-02-04 NOTE — Assessment & Plan Note (Signed)
History of hyperlipidemia on statin therapy followed by her PCP. 

## 2019-02-04 NOTE — Assessment & Plan Note (Signed)
History of acute on chronic diastolic heart failure with 1-2+ pitting edema, moderate renal insufficiency on furosemide and hydrochlorothiazide.  We have been battling the balance between optimal volume and renal function.  She does not eat salt.  I am going to change her furosemide from 40 mg p.o. 3 times daily to 80 mg p.o. twice daily and we will check a basic metabolic panel in 7 to 10 days.  She will see Kerin Ransom back in 6 weeks and me back in 6 months

## 2019-02-04 NOTE — Patient Instructions (Signed)
Medication Instructions:  Your physician has recommended you make the following change in your medication:  STOP TAKING FUROSEMIDE (LASIX) 40 MG.  START TAKING FUROSEMIDE (LASIX) 80 MG TWICE A DAY.  If you need a refill on your cardiac medications before your next appointment, please call your pharmacy.   Lab work: Your physician recommends that you return for lab work in 10 days: basic metabolic panel  If you have labs (blood work) drawn today and your tests are completely normal, you will receive your results only by: Marland Kitchen MyChart Message (if you have MyChart) OR . A paper copy in the mail If you have any lab test that is abnormal or we need to change your treatment, we will call you to review the results.  Testing/Procedures: none  Follow-Up: At [facility], you and your health needs are our priority.  As part of our continuing mission to provide you with exceptional heart care, we have created designated Provider Care Teams.  These Care Teams include your primary Cardiologist (physician) and Advanced Practice Providers (APPs -  Physician Assistants and Nurse Practitioners) who all work together to provide you with the care you need, when you need it. You will need a follow up appointment in 6 weeks with [name] Ransom, PA-c and in 6 months with Dr. Quay [name].  Please call our office 2 months in advance to schedule each appointment.

## 2019-02-04 NOTE — Progress Notes (Signed)
02/04/2019 [name] [name]   06/28/1940  [id number]  Primary Physician Nolene Ebbs, MD Primary Cardiologist: Lorretta Harp MD FACP, [place], [place], [place]  HPI:  [name] [name] is a 78 y.o.  severely overweight widowed African American female, mother of 1 and grandmother to 2 grandchildren, whom I last saw  09/29/2017.Marland Kitchen She has a history of congestive heart failure probably related to diastolic dysfunction with normal LV function. She had moderate concentric LVH and grade 1 diastolic dysfunction by echo January 25, 2012. Her other problems include hypertension, non-insulin-dependent diabetes, hyperlipidemia, and obstructive sleep apnea. She had a negative Myoview May 02, 2008. She was admitted October 8-13 of this year with shortness of breath. The CT angiogram showed multiple small pulmonary emboli and she was coumadinized. Her lower extremity venous Dopplers were negative. Her primary care physician has since stopped her Coumadin anticoagulation. Since I saw her a year ago she's been asymptomatic other than chronic shortness of breath.  She is had recurrent of her venous ulcers and lower extremity edema and is seen Dr. Dellia Nims at the wound care center. She was on twice daily furosemide which resulted in worsening of renal function so currently she is on daily.  Her serum creatinine runs in the 2 range.  Since I saw her back of a year ago she has seen that [facility] back in July for lower extremity edema.  Her furosemide was adjusted.  She still has 1-2+ pitting edema and does not eat salt.  She does complain of shortness of breath.  I am going to further adjust her diuretic regimen and will have her see Lurena Joiner back in follow-up   Current Meds  Medication Sig  . acetaminophen (TYLENOL) 650 MG CR tablet Take 1,300 mg by mouth every 8 (eight) hours as needed for pain.  Marland Kitchen albuterol (PROVENTIL HFA;VENTOLIN HFA) 108 (90 BASE) MCG/ACT inhaler Inhale 2 puffs into the lungs See admin instructions.  Every 6 hours as needed for SOB but also takes 2 puffs BID scheduled.  Marland Kitchen albuterol (PROVENTIL) (2.5 MG/3ML) 0.083% nebulizer solution Take 2.5 mg by nebulization every 6 (six) hours as needed for wheezing or shortness of breath.   Marland Kitchen alendronate (FOSAMAX) 70 MG tablet Take 70 mg by mouth every Monday. Take with a full glass of water on an empty stomach.  Marland Kitchen allopurinol (ZYLOPRIM) 100 MG tablet Take 100 mg by mouth daily.  Marland Kitchen aspirin 325 MG tablet Take 325 mg by mouth daily.  . brimonidine (ALPHAGAN P) 0.1 % SOLN Place 1 drop into both eyes 2 (two) times a day.  . budesonide (PULMICORT) 0.5 MG/2ML nebulizer solution Take 0.5 mg by nebulization every 6 (six) hours as needed (For wheezing.).   Marland Kitchen calcium carbonate (OS-CAL) 1250 (500 Ca) MG chewable tablet Chew 1 tablet by mouth daily.  . cetirizine (ZYRTEC) 10 MG tablet Take 10 mg by mouth daily.  . cholecalciferol (VITAMIN D) 1000 UNITS tablet Take 1,000 Units by mouth daily.  . citalopram (CELEXA) 10 MG tablet Take 10 mg by mouth daily.   . clobetasol ointment (TEMOVATE) 8.11 % Apply 1 application topically 2 (two) times daily.   Marland Kitchen COLCRYS 0.6 MG tablet Take 1 tablet by mouth twice daily  . desonide (DESOWEN) 0.05 % cream Apply 1 application topically 2 (two) times daily.   Marland Kitchen dexlansoprazole (DEXILANT) 60 MG capsule Take 60 mg by mouth daily.   . diclofenac sodium (VOLTAREN) 1 % GEL Apply 2 g topically daily. Reported on 09/15/2015  .  diltiazem (CARTIA XT) 300 MG 24 hr capsule Take 300 mg by mouth daily. Reported on 09/15/2015  . diphenhydrAMINE (BENADRYL) 25 MG tablet Take 25 mg by mouth every 6 (six) hours as needed for itching.   . furosemide (LASIX) 40 MG tablet Take 1 tablet twice a day for 3 days then 1 tablet once day  . gabapentin (NEURONTIN) 300 MG capsule Take 1 capsule (300 mg total) by mouth 2 (two) times daily.  . hydrALAZINE (APRESOLINE) 50 MG tablet Take 50 mg by mouth 3 (three) times daily.   . hydroxychloroquine (PLAQUENIL) 200 MG  tablet Take 200 mg by mouth daily.   . insulin aspart (NOVOLOG) 100 UNIT/ML injection Inject 10 Units into the skin 3 (three) times daily before meals.   . insulin glargine (LANTUS) 100 UNIT/ML injection Inject 60-70 Units into the skin 2 (two) times daily. She takes 60 units in the morning and 70 units at bedtime.  . isosorbide mononitrate (IMDUR) 30 MG 24 hr tablet Take 30 mg by mouth every morning.  Marland Kitchen ketotifen (ALAWAY) 0.025 % ophthalmic solution Place 1 drop into both eyes daily.   Marland Kitchen lisinopril-hydrochlorothiazide (ZESTORETIC) 20-12.5 MG tablet   . LORazepam (ATIVAN) 0.5 MG tablet Take 1 tablet (0.5 mg total) by mouth every 4 (four) hours as needed for anxiety (For anxiety with PET-CT or MRI imaging).  . metoprolol tartrate (LOPRESSOR) 50 MG tablet Take 1 tablet by mouth twice daily  . oxybutynin (DITROPAN-XL) 10 MG 24 hr tablet Take 10 mg by mouth daily.  . OXYGEN Inhale into the lungs. 2L/min - prn during day and every evening.  . pravastatin (PRAVACHOL) 40 MG tablet Take 40 mg by mouth at bedtime.   . SYMBICORT 160-4.5 MCG/ACT inhaler Inhale 2 puffs into the lungs 2 (two) times daily. Reported on 09/15/2015  . tizanidine (ZANAFLEX) 2 MG capsule Take 2 mg by mouth 2 (two) times daily.  . traMADol (ULTRAM) 50 MG tablet Take 1 tablet (50 mg total) by mouth every 6 (six) hours as needed for severe pain.  Marland Kitchen trimethoprim (TRIMPEX) 100 MG tablet Take 100 mg by mouth daily.  . vitamin B-12 (CYANOCOBALAMIN) 1000 MCG tablet Take 1,000 mcg by mouth daily. Reported on 09/15/2015     Allergies  Allergen Reactions  . Penicillins Swelling    Has patient had a PCN reaction causing immediate rash, facial/tongue/throat swelling, SOB or lightheadedness with hypotension: Yes Has patient had a PCN reaction causing severe rash involving mucus membranes or skin necrosis: Yes Has patient had a PCN reaction that required hospitalization Yes Has patient had a PCN reaction occurring within the last 10 years: No,  more than 10 yrs ago If all of the above answers are "NO", then may proceed with Cephalosporin use.   . Fentanyl Itching  . Peach [Prunus Persica] Hives  . Shellfish Allergy Hives    Social History   Socioeconomic History  . Marital status: Single    Spouse name: Not on file  . Number of children: 5  . Years of education: 68  . Highest education level: Not on file  Occupational History  . Occupation: Disabled  Social Needs  . Financial resource strain: Not on file  . Food insecurity    Worry: Not on file    Inability: Not on file  . Transportation needs    Medical: Not on file    Non-medical: Not on file  Tobacco Use  . Smoking status: Former Research scientist (life sciences)  . Smokeless tobacco: Never Used  .  Tobacco comment: Quit 1980  Substance and Sexual Activity  . Alcohol use: No    Alcohol/week: 0.0 standard drinks  . Drug use: No  . Sexual activity: Not on file  Lifestyle  . Physical activity    Days per week: Not on file    Minutes per session: Not on file  . Stress: Not on file  Relationships  . Social Herbalist on phone: Not on file    Gets together: Not on file    Attends religious service: Not on file    Active member of club or organization: Not on file    Attends meetings of clubs or organizations: Not on file    Relationship status: Not on file  . Intimate partner violence    Fear of current or ex partner: Not on file    Emotionally abused: Not on file    Physically abused: Not on file    Forced sexual activity: Not on file  Other Topics Concern  . Not on file  Social History Narrative   Lives at home alone.   Right-handed.   2-4 cups caffeine daily.     Review of Systems: General: negative for chills, fever, night sweats or weight changes.  Cardiovascular: negative for chest pain, dyspnea on exertion, edema, orthopnea, palpitations, paroxysmal nocturnal dyspnea or shortness of breath Dermatological: negative for rash Respiratory: negative for cough  or wheezing Urologic: negative for hematuria Abdominal: negative for nausea, vomiting, diarrhea, bright red blood per rectum, melena, or hematemesis Neurologic: negative for visual changes, syncope, or dizziness All other systems reviewed and are otherwise negative except as noted above.    Blood pressure (!) 168/81, pulse 85, height 5\' 4"  (1.626 m), weight 226 lb (102.5 kg), SpO2 95 %.  General appearance: alert and no distress Neck: no adenopathy, no carotid bruit, no JVD, supple, symmetrical, trachea midline and thyroid not enlarged, symmetric, no tenderness/mass/nodules Lungs: clear to auscultation bilaterally Heart: regular rate and rhythm, S1, S2 normal, no murmur, click, rub or gallop Extremities: 1-2+ pitting left worse than right with some venous stasis changes. Pulses: Diminished pedal pulses Skin: Venous stasis changes bilaterally Neurologic: Alert and oriented X 3, normal strength and tone. Normal symmetric reflexes. Normal coordination and gait  EKG not performed today  ASSESSMENT AND PLAN:   Acute on chronic diastolic (congestive) heart failure (HCC) History of acute on chronic diastolic heart failure with 1-2+ pitting edema, moderate renal insufficiency on furosemide and hydrochlorothiazide.  We have been battling the balance between optimal volume and renal function.  She does not eat salt.  I am going to change her furosemide from 40 mg p.o. 3 times daily to 80 mg p.o. twice daily and we will check a basic metabolic panel in 7 to 10 days.  She will see Kerin Ransom back in 6 weeks and me back in 6 months  Hyperlipidemia History of hyperlipidemia on statin therapy followed by her PCP      Lorretta Harp MD [facility], [facility] 02/04/2019 3:13 PM

## 2019-02-13 ENCOUNTER — Ambulatory Visit: Payer: Medicare HMO | Admitting: Surgery

## 2019-02-15 ENCOUNTER — Inpatient Hospital Stay (HOSPITAL_COMMUNITY)
Admission: EM | Admit: 2019-02-15 | Discharge: 2019-02-21 | DRG: 871 | Disposition: A | Discharge status: Skilled Nursing Facility | Payer: Medicare HMO | Attending: Internal Medicine | Admitting: Internal Medicine

## 2019-02-15 ENCOUNTER — Encounter (HOSPITAL_COMMUNITY): Payer: Self-pay

## 2019-02-15 ENCOUNTER — Other Ambulatory Visit: Payer: Self-pay

## 2019-02-15 ENCOUNTER — Emergency Department (HOSPITAL_COMMUNITY): Payer: Medicare HMO

## 2019-02-15 DIAGNOSIS — E872 Acidosis: Secondary | ICD-10-CM | POA: Diagnosis present

## 2019-02-15 DIAGNOSIS — Z87891 Personal history of nicotine dependence: Secondary | ICD-10-CM

## 2019-02-15 DIAGNOSIS — N184 Chronic kidney disease, stage 4 (severe): Secondary | ICD-10-CM | POA: Diagnosis not present

## 2019-02-15 DIAGNOSIS — M47812 Spondylosis without myelopathy or radiculopathy, cervical region: Secondary | ICD-10-CM

## 2019-02-15 DIAGNOSIS — A4189 Other specified sepsis: Principal | ICD-10-CM | POA: Diagnosis present

## 2019-02-15 DIAGNOSIS — R6510 Systemic inflammatory response syndrome (SIRS) of non-infectious origin without acute organ dysfunction: Secondary | ICD-10-CM | POA: Diagnosis present

## 2019-02-15 DIAGNOSIS — N189 Chronic kidney disease, unspecified: Secondary | ICD-10-CM

## 2019-02-15 DIAGNOSIS — J440 Chronic obstructive pulmonary disease with acute lower respiratory infection: Secondary | ICD-10-CM | POA: Diagnosis present

## 2019-02-15 DIAGNOSIS — I10 Essential (primary) hypertension: Secondary | ICD-10-CM | POA: Diagnosis present

## 2019-02-15 DIAGNOSIS — J129 Viral pneumonia, unspecified: Principal | ICD-10-CM

## 2019-02-15 DIAGNOSIS — J9601 Acute respiratory failure with hypoxia: Secondary | ICD-10-CM

## 2019-02-15 DIAGNOSIS — Z66 Do not resuscitate: Secondary | ICD-10-CM | POA: Diagnosis present

## 2019-02-15 DIAGNOSIS — I4510 Unspecified right bundle-branch block: Secondary | ICD-10-CM | POA: Diagnosis present

## 2019-02-15 DIAGNOSIS — E119 Type 2 diabetes mellitus without complications: Secondary | ICD-10-CM | POA: Diagnosis not present

## 2019-02-15 DIAGNOSIS — E1165 Type 2 diabetes mellitus with hyperglycemia: Secondary | ICD-10-CM | POA: Diagnosis present

## 2019-02-15 DIAGNOSIS — E1169 Type 2 diabetes mellitus with other specified complication: Secondary | ICD-10-CM | POA: Diagnosis present

## 2019-02-15 DIAGNOSIS — I5033 Acute on chronic diastolic (congestive) heart failure: Secondary | ICD-10-CM | POA: Diagnosis not present

## 2019-02-15 DIAGNOSIS — R509 Fever, unspecified: Secondary | ICD-10-CM

## 2019-02-15 DIAGNOSIS — E869 Volume depletion, unspecified: Secondary | ICD-10-CM | POA: Diagnosis present

## 2019-02-15 DIAGNOSIS — J9621 Acute and chronic respiratory failure with hypoxia: Secondary | ICD-10-CM | POA: Diagnosis present

## 2019-02-15 DIAGNOSIS — Z794 Long term (current) use of insulin: Secondary | ICD-10-CM

## 2019-02-15 DIAGNOSIS — E785 Hyperlipidemia, unspecified: Secondary | ICD-10-CM | POA: Diagnosis not present

## 2019-02-15 DIAGNOSIS — R6520 Severe sepsis without septic shock: Secondary | ICD-10-CM | POA: Diagnosis present

## 2019-02-15 DIAGNOSIS — J1289 Other viral pneumonia: Secondary | ICD-10-CM | POA: Diagnosis present

## 2019-02-15 DIAGNOSIS — Z880 Allergy status to penicillin: Secondary | ICD-10-CM

## 2019-02-15 DIAGNOSIS — Z86711 Personal history of pulmonary embolism: Secondary | ICD-10-CM

## 2019-02-15 DIAGNOSIS — M8580 Other specified disorders of bone density and structure, unspecified site: Secondary | ICD-10-CM | POA: Diagnosis present

## 2019-02-15 DIAGNOSIS — L8990 Pressure ulcer of unspecified site, unspecified stage: Secondary | ICD-10-CM | POA: Insufficient documentation

## 2019-02-15 DIAGNOSIS — N179 Acute kidney failure, unspecified: Secondary | ICD-10-CM | POA: Diagnosis present

## 2019-02-15 DIAGNOSIS — F0390 Unspecified dementia without behavioral disturbance: Secondary | ICD-10-CM | POA: Diagnosis present

## 2019-02-15 DIAGNOSIS — I1310 Hypertensive heart and chronic kidney disease without heart failure, with stage 1 through stage 4 chronic kidney disease, or unspecified chronic kidney disease: Secondary | ICD-10-CM | POA: Diagnosis present

## 2019-02-15 DIAGNOSIS — Z7901 Long term (current) use of anticoagulants: Secondary | ICD-10-CM

## 2019-02-15 DIAGNOSIS — J069 Acute upper respiratory infection, unspecified: Secondary | ICD-10-CM | POA: Diagnosis not present

## 2019-02-15 DIAGNOSIS — Z20822 Contact with and (suspected) exposure to covid-19: Secondary | ICD-10-CM

## 2019-02-15 DIAGNOSIS — M6281 Muscle weakness (generalized): Secondary | ICD-10-CM | POA: Diagnosis not present

## 2019-02-15 DIAGNOSIS — A419 Sepsis, unspecified organism: Secondary | ICD-10-CM

## 2019-02-15 DIAGNOSIS — D631 Anemia in chronic kidney disease: Secondary | ICD-10-CM | POA: Diagnosis present

## 2019-02-15 DIAGNOSIS — E1142 Type 2 diabetes mellitus with diabetic polyneuropathy: Secondary | ICD-10-CM | POA: Diagnosis not present

## 2019-02-15 DIAGNOSIS — M329 Systemic lupus erythematosus, unspecified: Secondary | ICD-10-CM | POA: Diagnosis not present

## 2019-02-15 DIAGNOSIS — J449 Chronic obstructive pulmonary disease, unspecified: Secondary | ICD-10-CM | POA: Diagnosis not present

## 2019-02-15 DIAGNOSIS — Z8249 Family history of ischemic heart disease and other diseases of the circulatory system: Secondary | ICD-10-CM

## 2019-02-15 DIAGNOSIS — I272 Pulmonary hypertension, unspecified: Secondary | ICD-10-CM | POA: Diagnosis present

## 2019-02-15 DIAGNOSIS — R262 Difficulty in walking, not elsewhere classified: Secondary | ICD-10-CM | POA: Diagnosis not present

## 2019-02-15 DIAGNOSIS — D472 Monoclonal gammopathy: Secondary | ICD-10-CM | POA: Diagnosis present

## 2019-02-15 DIAGNOSIS — N1831 Chronic kidney disease, stage 3a: Secondary | ICD-10-CM | POA: Diagnosis not present

## 2019-02-15 DIAGNOSIS — E1159 Type 2 diabetes mellitus with other circulatory complications: Secondary | ICD-10-CM | POA: Diagnosis present

## 2019-02-15 DIAGNOSIS — U071 COVID-19: Secondary | ICD-10-CM | POA: Diagnosis not present

## 2019-02-15 DIAGNOSIS — Z91013 Allergy to seafood: Secondary | ICD-10-CM

## 2019-02-15 DIAGNOSIS — E1122 Type 2 diabetes mellitus with diabetic chronic kidney disease: Secondary | ICD-10-CM | POA: Diagnosis present

## 2019-02-15 DIAGNOSIS — Z7982 Long term (current) use of aspirin: Secondary | ICD-10-CM

## 2019-02-15 DIAGNOSIS — Z79899 Other long term (current) drug therapy: Secondary | ICD-10-CM

## 2019-02-15 DIAGNOSIS — Z803 Family history of malignant neoplasm of breast: Secondary | ICD-10-CM

## 2019-02-15 DIAGNOSIS — D638 Anemia in other chronic diseases classified elsewhere: Secondary | ICD-10-CM | POA: Diagnosis present

## 2019-02-15 DIAGNOSIS — Z90710 Acquired absence of both cervix and uterus: Secondary | ICD-10-CM

## 2019-02-15 DIAGNOSIS — K219 Gastro-esophageal reflux disease without esophagitis: Secondary | ICD-10-CM | POA: Diagnosis present

## 2019-02-15 DIAGNOSIS — R2681 Unsteadiness on feet: Secondary | ICD-10-CM | POA: Diagnosis not present

## 2019-02-15 DIAGNOSIS — M19012 Primary osteoarthritis, left shoulder: Secondary | ICD-10-CM

## 2019-02-15 DIAGNOSIS — Z79891 Long term (current) use of opiate analgesic: Secondary | ICD-10-CM

## 2019-02-15 DIAGNOSIS — I5032 Chronic diastolic (congestive) heart failure: Secondary | ICD-10-CM | POA: Diagnosis present

## 2019-02-15 DIAGNOSIS — Z853 Personal history of malignant neoplasm of breast: Secondary | ICD-10-CM

## 2019-02-15 DIAGNOSIS — I152 Hypertension secondary to endocrine disorders: Secondary | ICD-10-CM | POA: Diagnosis present

## 2019-02-15 DIAGNOSIS — F339 Major depressive disorder, recurrent, unspecified: Secondary | ICD-10-CM | POA: Diagnosis not present

## 2019-02-15 DIAGNOSIS — Z833 Family history of diabetes mellitus: Secondary | ICD-10-CM

## 2019-02-15 DIAGNOSIS — G5623 Lesion of ulnar nerve, bilateral upper limbs: Secondary | ICD-10-CM

## 2019-02-15 LAB — COMPREHENSIVE METABOLIC PANEL
ALT: 34 U/L (ref 0–44)
AST: 56 U/L — ABNORMAL HIGH (ref 15–41)
Albumin: 3.4 g/dL — ABNORMAL LOW (ref 3.5–5.0)
Alkaline Phosphatase: 63 U/L (ref 38–126)
Anion gap: 11 (ref 5–15)
BUN: 57 mg/dL — ABNORMAL HIGH (ref 8–23)
CO2: 19 mmol/L — ABNORMAL LOW (ref 22–32)
Calcium: 9.1 mg/dL (ref 8.9–10.3)
Chloride: 106 mmol/L (ref 98–111)
Creatinine, Ser: 2.63 mg/dL — ABNORMAL HIGH (ref 0.44–1.00)
GFR calc Af Amer: 20 mL/min — ABNORMAL LOW (ref >60)
GFR calc non Af Amer: 17 mL/min — ABNORMAL LOW (ref >60)
Glucose, Bld: 143 mg/dL — ABNORMAL HIGH (ref 70–99)
Potassium: 3.7 mmol/L (ref 3.5–5.1)
Sodium: 136 mmol/L (ref 135–145)
Total Bilirubin: 0.4 mg/dL (ref 0.3–1.2)
Total Protein: 7.2 g/dL (ref 6.5–8.1)

## 2019-02-15 LAB — CBC WITH DIFFERENTIAL/PLATELET
Abs Immature Granulocytes: 0.01 10*3/uL (ref 0.00–0.07)
Abs Immature Granulocytes: 0.01 10*3/uL (ref 0.00–0.07)
Basophils Absolute: 0 10*3/uL (ref 0.0–0.1)
Basophils Absolute: 0 10*3/uL (ref 0.0–0.1)
Basophils Relative: 0 %
Basophils Relative: 0 %
Eosinophils Absolute: 0 10*3/uL (ref 0.0–0.5)
Eosinophils Absolute: 0 10*3/uL (ref 0.0–0.5)
Eosinophils Relative: 0 %
Eosinophils Relative: 0 %
HCT: 34.8 % — ABNORMAL LOW (ref 36.0–46.0)
HCT: 34.7 % — ABNORMAL LOW (ref 36.0–46.0)
Hemoglobin: 10.6 g/dL — ABNORMAL LOW (ref 12.0–15.0)
Hemoglobin: 10.6 g/dL — ABNORMAL LOW (ref 12.0–15.0)
Immature Granulocytes: 0 %
Immature Granulocytes: 0 %
Lymphocytes Relative: 13 %
Lymphocytes Relative: 23 %
Lymphs Abs: 0.7 10*3/uL (ref 0.7–4.0)
Lymphs Abs: 1.1 10*3/uL (ref 0.7–4.0)
MCH: 28.6 pg (ref 26.0–34.0)
MCH: 28.7 pg (ref 26.0–34.0)
MCHC: 30.5 g/dL (ref 30.0–36.0)
MCHC: 30.5 g/dL (ref 30.0–36.0)
MCV: 94.1 fL (ref 80.0–100.0)
MCV: 94 fL (ref 80.0–100.0)
Monocytes Absolute: 0.4 10*3/uL (ref 0.1–1.0)
Monocytes Absolute: 0.4 10*3/uL (ref 0.1–1.0)
Monocytes Relative: 7 %
Monocytes Relative: 8 %
Neutro Abs: 4.3 10*3/uL (ref 1.7–7.7)
Neutro Abs: 3.3 10*3/uL (ref 1.7–7.7)
Neutrophils Relative %: 80 %
Neutrophils Relative %: 69 %
Platelets: 204 10*3/uL (ref 150–400)
Platelets: 178 10*3/uL (ref 150–400)
RBC: 3.7 MIL/uL — ABNORMAL LOW (ref 3.87–5.11)
RBC: 3.69 MIL/uL — ABNORMAL LOW (ref 3.87–5.11)
RDW: 13.8 % (ref 11.5–15.5)
RDW: 14 % (ref 11.5–15.5)
WBC: 5.4 10*3/uL (ref 4.0–10.5)
WBC: 4.8 10*3/uL (ref 4.0–10.5)
nRBC: 0 % (ref 0.0–0.2)
nRBC: 0 % (ref 0.0–0.2)

## 2019-02-15 LAB — FERRITIN: Ferritin: 686 ng/mL — ABNORMAL HIGH (ref 11–307)

## 2019-02-15 LAB — C-REACTIVE PROTEIN: CRP: 6.8 mg/dL — ABNORMAL HIGH (ref <1.0)

## 2019-02-15 LAB — D-DIMER, QUANTITATIVE
D-Dimer, Quant: 3.99 ug/mL-FEU — ABNORMAL HIGH (ref 0.00–0.50)
D-Dimer, Quant: 3.67 ug/mL-FEU — ABNORMAL HIGH (ref 0.00–0.50)

## 2019-02-15 LAB — BRAIN NATRIURETIC PEPTIDE: B Natriuretic Peptide: 21.7 pg/mL (ref 0.0–100.0)

## 2019-02-15 LAB — LACTATE DEHYDROGENASE: LDH: 265 U/L — ABNORMAL HIGH (ref 98–192)

## 2019-02-15 LAB — TROPONIN I (HIGH SENSITIVITY): Troponin I (High Sensitivity): 64 ng/L — ABNORMAL HIGH (ref <18)

## 2019-02-15 LAB — ABO/RH: ABO/RH(D): B POS

## 2019-02-15 LAB — LACTIC ACID, PLASMA
Lactic Acid, Venous: 1 mmol/L (ref 0.5–1.9)
Lactic Acid, Venous: 0.9 mmol/L (ref 0.5–1.9)

## 2019-02-15 LAB — PROCALCITONIN: Procalcitonin: 1.71 ng/mL

## 2019-02-15 LAB — FIBRINOGEN: Fibrinogen: 541 mg/dL — ABNORMAL HIGH (ref 210–475)

## 2019-02-15 LAB — TRIGLYCERIDES: Triglycerides: 124 mg/dL (ref <150)

## 2019-02-15 MED ORDER — METOPROLOL TARTRATE 50 MG PO TABS
50.0000 mg | ORAL_TABLET | Freq: Two times a day (BID) | ORAL | Status: DC
  Administered 2019-02-16 – 2019-02-18 (×5): 50 mg via ORAL
  Filled 2019-02-15: qty 2
  Filled 2019-02-15 (×2): qty 1
  Filled 2019-02-15: qty 2
  Filled 2019-02-15 (×2): qty 1
  Filled 2019-02-15: qty 2

## 2019-02-15 MED ORDER — SENNOSIDES-DOCUSATE SODIUM 8.6-50 MG PO TABS
1.0000 | ORAL_TABLET | Freq: Every evening | ORAL | Status: DC | PRN
  Administered 2019-02-16: 12:00:00 1 via ORAL
  Filled 2019-02-15: qty 1

## 2019-02-15 MED ORDER — VITAMIN C 500 MG PO TABS
500.0000 mg | ORAL_TABLET | Freq: Every day | ORAL | Status: DC
  Administered 2019-02-16 – 2019-02-21 (×7): 500 mg via ORAL
  Filled 2019-02-15 (×7): qty 1

## 2019-02-15 MED ORDER — HEPARIN SODIUM (PORCINE) 5000 UNIT/ML IJ SOLN
5000.0000 [IU] | Freq: Three times a day (TID) | INTRAMUSCULAR | Status: DC
  Administered 2019-02-16 – 2019-02-17 (×4): 5000 [IU] via SUBCUTANEOUS
  Filled 2019-02-15 (×6): qty 1

## 2019-02-15 MED ORDER — IBUPROFEN 200 MG PO TABS
600.0000 mg | ORAL_TABLET | Freq: Once | ORAL | Status: AC
  Administered 2019-02-15: 600 mg via ORAL
  Filled 2019-02-15: qty 3

## 2019-02-15 MED ORDER — LEVOFLOXACIN IN D5W 500 MG/100ML IV SOLN
500.0000 mg | Freq: Once | INTRAVENOUS | Status: AC
  Administered 2019-02-15: 500 mg via INTRAVENOUS
  Filled 2019-02-15: qty 100

## 2019-02-15 MED ORDER — PRAVASTATIN SODIUM 40 MG PO TABS
40.0000 mg | ORAL_TABLET | Freq: Every day | ORAL | Status: DC
  Administered 2019-02-16 – 2019-02-20 (×6): 40 mg via ORAL
  Filled 2019-02-15: qty 2
  Filled 2019-02-15 (×2): qty 1
  Filled 2019-02-15: qty 2
  Filled 2019-02-15 (×2): qty 1

## 2019-02-15 MED ORDER — ALBUTEROL SULFATE HFA 108 (90 BASE) MCG/ACT IN AERS: 2.0000 | INHALATION_SPRAY | RESPIRATORY_TRACT | Status: DC | PRN

## 2019-02-15 MED ORDER — ZINC SULFATE 220 (50 ZN) MG PO CAPS
220.0000 mg | ORAL_CAPSULE | Freq: Every day | ORAL | Status: DC
  Administered 2019-02-16 – 2019-02-21 (×6): 220 mg via ORAL
  Filled 2019-02-15 (×6): qty 1

## 2019-02-15 MED ORDER — LEVOFLOXACIN 500 MG PO TABS: 500.0000 mg | ORAL_TABLET | Freq: Every day | ORAL | Status: DC

## 2019-02-15 MED ORDER — SODIUM CHLORIDE 0.9 % IV BOLUS
500.0000 mL | Freq: Once | INTRAVENOUS | Status: AC
  Administered 2019-02-16: 01:00:00 500 mL via INTRAVENOUS

## 2019-02-15 MED ORDER — DILTIAZEM HCL ER BEADS 300 MG PO CP24
300.0000 mg | ORAL_CAPSULE | Freq: Every day | ORAL | Status: DC
  Administered 2019-02-16 – 2019-02-17 (×2): 300 mg via ORAL
  Filled 2019-02-15 (×3): qty 1

## 2019-02-15 MED ORDER — ACETAMINOPHEN 325 MG PO TABS
650.0000 mg | ORAL_TABLET | Freq: Four times a day (QID) | ORAL | Status: DC | PRN
  Administered 2019-02-16 – 2019-02-17 (×2): 650 mg via ORAL
  Filled 2019-02-15 (×2): qty 2

## 2019-02-15 MED ORDER — ISOSORBIDE MONONITRATE ER 30 MG PO TB24
30.0000 mg | ORAL_TABLET | Freq: Every day | ORAL | Status: DC
  Administered 2019-02-16 – 2019-02-18 (×3): 30 mg via ORAL
  Filled 2019-02-15 (×4): qty 1

## 2019-02-15 MED ORDER — HYDRALAZINE HCL 50 MG PO TABS
50.0000 mg | ORAL_TABLET | Freq: Three times a day (TID) | ORAL | Status: DC
  Administered 2019-02-16 – 2019-02-18 (×9): 50 mg via ORAL
  Filled 2019-02-15 (×10): qty 1

## 2019-02-15 MED ORDER — HYDROXYCHLOROQUINE SULFATE 200 MG PO TABS
200.0000 mg | ORAL_TABLET | Freq: Every day | ORAL | Status: DC
  Administered 2019-02-16 – 2019-02-21 (×6): 200 mg via ORAL
  Filled 2019-02-15 (×7): qty 1

## 2019-02-15 MED ORDER — ACETAMINOPHEN 325 MG PO TABS: 650.0000 mg | ORAL_TABLET | Freq: Once | ORAL | Status: DC

## 2019-02-15 MED ORDER — ALBUTEROL SULFATE HFA 108 (90 BASE) MCG/ACT IN AERS: 2.0000 | INHALATION_SPRAY | RESPIRATORY_TRACT | Status: DC

## 2019-02-15 MED ORDER — IPRATROPIUM-ALBUTEROL 20-100 MCG/ACT IN AERS
1.0000 | INHALATION_SPRAY | Freq: Four times a day (QID) | RESPIRATORY_TRACT | Status: DC
  Administered 2019-02-16 – 2019-02-21 (×21): 1 via RESPIRATORY_TRACT
  Filled 2019-02-15: qty 4

## 2019-02-15 MED ORDER — INSULIN ASPART 100 UNIT/ML ~~LOC~~ SOLN
0.0000 [IU] | Freq: Three times a day (TID) | SUBCUTANEOUS | Status: DC
  Administered 2019-02-16: 13:00:00 1 [IU] via SUBCUTANEOUS
  Administered 2019-02-16: 17:00:00 2 [IU] via SUBCUTANEOUS
  Administered 2019-02-17: 17:00:00 7 [IU] via SUBCUTANEOUS
  Administered 2019-02-17: 12:00:00 9 [IU] via SUBCUTANEOUS

## 2019-02-15 NOTE — H&P (Signed)
History and Physical    [name] [name]:[id number] DOB: 1941-01-28 DOA: 02/15/2019  PCP: Nolene Ebbs, MD  Patient coming from: Home  I have personally briefly reviewed patient's old medical records in [place]  Chief Complaint: Weakness, exposure to COVID-19  HPI: [name] [name] is a 78 y.o. 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  History limited from patient due to somnolence, otherwise supplemented from EDP, chart review, and caretaker by phone.  Caretaker states that she (the caretaker) was tested for COVID-19 on 02/07/2019 and found out she was positive on 02/08/2019.  She was in contact with patient prior to positive result but has been quarantining since then and speaking to her by phone/video call since then.  She states that patient had one episode of vomiting about a week ago and reported 1 episode of bowel incontinence in the interim but otherwise had no complaints.  Today home health nurse came to visit the patient and noted that she had a fever.  EMS were called and per triage notes temperature is 101.7 F with SPO2 85% on room air which improved to 98% on 2 L supplemental O2 via [name] [name].  She is brought to the ED for further evaluation.  Per caretaker, patient normally ambulates on her own and is usually alert and oriented and communicates well.  She has no known history of dementia per caretaker.  At time of admission, patient is somnolent but will intermittently awaken to voice and light touch.  She is oriented to self, place, and knows the current president.  She states the year is 2006.  She otherwise nods her head yes to all other questions.  ED Course:  Initial vitals showed BP 145/66, pulse 109, RR  27, temp 101.4 Fahrenheit rectally, SPO2 85% on room air per triage notes improved to 100% on 2 L supplemental O2 via .  Labs are notable for BUN 57, creatinine 2.63, EGFR 20, sodium 136, potassium 3.7, bicarb 19, serum glucose 143, WBC 5.4, hemoglobin 10.6, platelets 204,000, lactic acid 1.0, LDH 265, ferritin 686, fibrinogen 541, CRP 6.8, high-sensitivity troponin I 64, procalcitonin 1.71.  Blood cultures were obtained and pending.  SARS-CoV-2 test was obtained and pending.  Portable chest x-ray showed scattered bilateral groundglass airspace opacities.  Patient was given Advil and started on empiric IV antibiotics with Levaquin (penicillin allergy).  The hospitalist service was consulted to admit for further evaluation and management.  Review of Systems:  Full review of systems limited due to patient's hypersomnolence.   Past Medical History:  Diagnosis Date  . Allergic rhinitis   . Anxiety   . Arthritis   . Asthma   . Cancer [facility]) 2006   breast cancer right  . CHF (congestive heart failure) ([place])   . COPD (chronic obstructive pulmonary disease) ([place])   . Degenerative disc disease, lumbar   . Diabetes mellitus without complication ([place])   . Dyspnea    walking distances  . Eczema   . GERD (gastroesophageal reflux disease)   . Glaucoma   . History of home oxygen therapy    uses 2 liters ay night and prn  . Hyperlipidemia   . Hypertension   . Low back pain   .  Lupus ([place])    skin  . Neck pain   . Numbness and tingling   . Obesity   . Osteopenia   . Pulmonary embolism ([place]) 01/2012    CT showed multi small PE and coumadized   . Sleep apnea 2010   no cpap used  . Systemic lupus erythematosus ([place])     Past Surgical History:  Procedure Laterality Date  . ABDOMINAL HYSTERECTOMY  1976  . BACK SURGERY  2006   lower  . BREAST BIOPSY Right   . BREAST EXCISIONAL BIOPSY Left   . BREAST LUMPECTOMY Right   . COLONOSCOPY WITH PROPOFOL N/A 03/23/2015   Procedure:  COLONOSCOPY WITH PROPOFOL;  Surgeon: Laurence Spates, MD;  Location: [facility] ENDOSCOPY;  Service: Endoscopy;  Laterality: N/A;  . Dobtamine myoview  05/02/2008   EF 67% ; LV norm  . DOPPLER ECHOCARDIOGRAPHY  01/25/2012   EF 55 TO 60%; LV norm.  . EXPLORATORY LAPAROTOMY    . EYE SURGERY Bilateral 2010   lens reaplcments for cataracts   . Lower Extrem. venous doppler  01/25/2012    neg.  . TOE SURGERY  1996   Bunion    Social History:  reports that she has quit smoking. She has never used smokeless tobacco. She reports that she does not drink alcohol or use drugs.  Allergies  Allergen Reactions  . Penicillins Swelling    Has patient had a PCN reaction causing immediate rash, facial/tongue/throat swelling, SOB or lightheadedness with hypotension: Yes Has patient had a PCN reaction causing severe rash involving mucus membranes or skin necrosis: Yes Has patient had a PCN reaction that required hospitalization Yes Has patient had a PCN reaction occurring within the last 10 years: No, more than 10 yrs ago If all of the above answers are "NO", then may proceed with Cephalosporin use.   . Fentanyl Itching  . Peach [Prunus Persica] Hives  . Shellfish Allergy Hives    Family History  Problem Relation Age of Onset  . Heart failure Father   . Heart failure Mother   . Diabetes Brother   . Breast cancer Maternal Aunt   . Breast cancer Paternal Aunt      Prior to Admission medications   Medication Sig Start Date End Date Taking? Authorizing Provider  acetaminophen (TYLENOL) 650 MG CR tablet Take 1,300 mg by mouth every 8 (eight) hours as needed for pain.    [provider]  albuterol (PROVENTIL HFA;VENTOLIN HFA) 108 (90 BASE) MCG/ACT inhaler Inhale 2 puffs into the lungs See admin instructions. Every 6 hours as needed for SOB but also takes 2 puffs BID scheduled.    [provider]  albuterol (PROVENTIL) (2.5 MG/3ML) 0.083% nebulizer solution Take 2.5 mg by nebulization every  6 (six) hours as needed for wheezing or shortness of breath.     [provider]  alendronate (FOSAMAX) 70 MG tablet Take 70 mg by mouth every Monday. Take with a full glass of water on an empty stomach.    [provider]  allopurinol (ZYLOPRIM) 100 MG tablet Take 100 mg by mouth daily.    [provider]  aspirin 325 MG tablet Take 325 mg by mouth daily.    [provider]  brimonidine (ALPHAGAN P) 0.1 % SOLN Place 1 drop into both eyes 2 (two) times a day.    [provider]  budesonide (PULMICORT) 0.5 MG/2ML nebulizer solution Take 0.5 mg by nebulization every 6 (six) hours as needed (For wheezing.).  [provider]  calcium carbonate (OS-CAL) 1250 (500 Ca) MG chewable tablet Chew 1 tablet by mouth daily.    [provider]  cetirizine (ZYRTEC) 10 MG tablet Take 10 mg by mouth daily.    [provider]  cholecalciferol (VITAMIN D) 1000 UNITS tablet Take 1,000 Units by mouth daily.    [provider]  citalopram (CELEXA) 10 MG tablet Take 10 mg by mouth daily.     [provider]  clobetasol ointment (TEMOVATE) 1.60 % Apply 1 application topically 2 (two) times daily.     [provider]  COLCRYS 0.6 MG tablet Take 1 tablet by mouth twice daily 12/31/18   Jessy Oto, MD  desonide (DESOWEN) 0.05 % cream Apply 1 application topically 2 (two) times daily.     [provider]  dexlansoprazole (DEXILANT) 60 MG capsule Take 60 mg by mouth daily.     [provider]  diclofenac sodium (VOLTAREN) 1 % GEL Apply 2 g topically daily. Reported on 09/15/2015    [provider]  diltiazem (CARTIA XT) 300 MG 24 hr capsule Take 300 mg by mouth daily. Reported on 09/15/2015    [provider]  diphenhydrAMINE (BENADRYL) 25 MG tablet Take 25 mg by mouth every 6 (six) hours as needed for itching.     [provider]  furosemide (LASIX) 80 MG tablet Take 1 tablet (80 mg  total) by mouth 2 (two) times daily. 02/04/19   Lorretta Harp, MD  gabapentin (NEURONTIN) 300 MG capsule Take 1 capsule (300 mg total) by mouth 2 (two) times daily. 04/05/18   Jessy Oto, MD  hydrALAZINE (APRESOLINE) 50 MG tablet Take 50 mg by mouth 3 (three) times daily.     [provider]  hydroxychloroquine (PLAQUENIL) 200 MG tablet Take 200 mg by mouth daily.     [provider]  insulin aspart (NOVOLOG) 100 UNIT/ML injection Inject 10 Units into the skin 3 (three) times daily before meals.     [provider]  insulin glargine (LANTUS) 100 UNIT/ML injection Inject 60-70 Units into the skin 2 (two) times daily. She takes 60 units in the morning and 70 units at bedtime. 01/29/12   Rai, Vernelle Emerald, MD  isosorbide mononitrate (IMDUR) 30 MG 24 hr tablet Take 30 mg by mouth every morning.    [provider]  ketotifen (ALAWAY) 0.025 % ophthalmic solution Place 1 drop into both eyes daily.     [provider]  lisinopril-hydrochlorothiazide (ZESTORETIC) 20-12.5 MG tablet  11/12/18   [provider]  LORazepam (ATIVAN) 0.5 MG tablet Take 1 tablet (0.5 mg total) by mouth every 4 (four) hours as needed for anxiety (For anxiety with PET-CT or MRI imaging). 05/10/17   Ardath Sax, MD  metoprolol tartrate (LOPRESSOR) 50 MG tablet Take 1 tablet by mouth twice daily 01/16/19   Kroeger, Lorelee Cover., PA-C  oxybutynin (DITROPAN-XL) 10 MG 24 hr tablet Take 10 mg by mouth daily. 11/11/18   [provider]  OXYGEN Inhale into the lungs. 2L/min - prn during day and every evening.    [provider]  pravastatin (PRAVACHOL) 40 MG tablet Take 40 mg by mouth at bedtime.     [provider]  SYMBICORT 160-4.5 MCG/ACT inhaler Inhale 2 puffs into the lungs 2 (two) times daily. Reported on 09/15/2015 04/22/13   [provider]  tizanidine (ZANAFLEX) 2 MG capsule Take 2 mg by mouth 2 (two) times daily.  [provider]   traMADol (ULTRAM) 50 MG tablet Take 1 tablet (50 mg total) by mouth every 6 (six) hours as needed for severe pain. 04/05/18   Jessy Oto, MD  trimethoprim (TRIMPEX) 100 MG tablet Take 100 mg by mouth daily. 11/23/18   [provider]  vitamin B-12 (CYANOCOBALAMIN) 1000 MCG tablet Take 1,000 mcg by mouth daily. Reported on 09/15/2015    [provider]    Physical Exam: Vitals:   02/15/19 1930 02/15/19 2000 02/15/19 2030 02/15/19 2100  BP:      Pulse: (!) 105 (!) 102 (!) 106 95  Resp: (!) 28 (!) 25 (!) 24 (!) 28  Temp:      TempSrc:      SpO2: 98% 100% 100% 99%    Constitutional: Obese woman resting in bed with head slightly elevated, NAD, calm, comfortable, somnolent Eyes: PERRL, lids and conjunctivae normal ENMT: Mucous membranes are moist. Posterior pharynx clear of any exudate or lesions. Neck: normal, supple, no masses. Respiratory: Rhonchi bilaterally anteriorly. Normal respiratory effort. No accessory muscle use.  Cardiovascular: Tachycardic, no murmurs / rubs / gallops. No extremity edema. 2+ pedal pulses. Abdomen: no tenderness, no masses palpated. No hepatosplenomegaly. Bowel sounds positive.  Musculoskeletal: no clubbing / cyanosis. No joint deformity upper and lower extremities.  Slow to move extremities but ROM intact, no contractures. Normal muscle tone.  Skin: no rashes, lesions, ulcers. Neurologic: CN 2-12 grossly intact. Sensation intact, slow to move extremities but strength 5/5 in all 4.  Psychiatric: Somnolent, intermittently awakens to voice and light touch.  She is oriented to self, place, and knows who the current president is Elenore Rota Trump).  She says the year is 2006.    Labs on Admission: I have personally reviewed following labs and imaging studies  CBC: Recent Labs  Lab 02/15/19 1810  WBC 5.4  NEUTROABS 4.3  HGB 10.6*  HCT 34.8*  MCV 94.1  PLT 882   Basic Metabolic Panel: Recent Labs  Lab 02/15/19 1810  NA 136  K 3.7  CL  106  CO2 19*  GLUCOSE 143*  BUN 57*  CREATININE 2.63*  CALCIUM 9.1   GFR: Estimated Creatinine Clearance: 20.9 mL/min (A) (by C-G formula based on SCr of 2.63 mg/dL (H)). Liver Function Tests: Recent Labs  Lab 02/15/19 1810  AST 56*  ALT 34  ALKPHOS 63  BILITOT 0.4  PROT 7.2  ALBUMIN 3.4*   No results for input(s): LIPASE, AMYLASE in the last 168 hours. No results for input(s): AMMONIA in the last 168 hours. Coagulation Profile: No results for input(s): INR, PROTIME in the last 168 hours. Cardiac Enzymes: No results for input(s): CKTOTAL, CKMB, CKMBINDEX, TROPONINI in the last 168 hours. BNP (last 3 results) Recent Labs    10/31/18 1528  PROBNP 478   HbA1C: No results for input(s): HGBA1C in the last 72 hours. CBG: No results for input(s): GLUCAP in the last 168 hours. Lipid Profile: Recent Labs    02/15/19 1810  TRIG 124   Thyroid Function Tests: No results for input(s): TSH, T4TOTAL, FREET4, T3FREE, THYROIDAB in the last 72 hours. Anemia Panel: Recent Labs    02/15/19 1810  FERRITIN 686*   Urine analysis:    Component Value Date/Time   COLORURINE YELLOW 02/04/2015 [place] 02/04/2015 1547   LABSPEC 1.012 02/04/2015 1547   PHURINE 5.0 02/04/2015 1547   GLUCOSEU NEGATIVE 02/04/2015 1547   HGBUR NEGATIVE 02/04/2015 [place] 02/04/2015 1547  KETONESUR NEGATIVE 02/04/2015 1547   PROTEINUR NEGATIVE 02/04/2015 1547   UROBILINOGEN 0.2 02/04/2015 1547   NITRITE NEGATIVE 02/04/2015 1547   LEUKOCYTESUR NEGATIVE 02/04/2015 1547    Radiological Exams on Admission: Dg Chest Port 1 View  Result Date: 02/15/2019 CLINICAL DATA:  Viral pneumonia EXAM: PORTABLE CHEST 1 VIEW COMPARISON:  12/22/2014 FINDINGS: The heart size is enlarged. Aortic calcifications are noted. There are scattered bilateral ground-glass airspace opacities, greatest within the left lower lobe. There is no pneumothorax. No large pleural effusion. There are  end-stage degenerative changes of the left glenohumeral joint. There is no acute osseous abnormality. IMPRESSION: Bilateral ground-glass airspace opacities which can be seen in patients with viral pneumonia. Electronically Signed   By: Constance Holster M.D.   On: 02/15/2019 19:25    EKG: Independently reviewed. Sinus tachycardia, RBBB.  When compared to prior rate is faster.  Assessment/Plan Principal Problem:   Acute on chronic respiratory failure with hypoxia (HCC) Active Problems:   Chronic diastolic CHF (congestive heart failure) (HCC)   COPD (chronic obstructive pulmonary disease) (HCC)   Diabetes mellitus (HCC)   Hypertension associated with diabetes ([place])   Hyperlipidemia associated with type 2 diabetes mellitus ([place])   Systemic lupus erythematosus ([place])   Acute-on-chronic kidney injury ([place])   Sepsis ([place])  Shakeitha TIRZA SENTENO is a 78 y.o. female with medical history significant for chronic diastolic CHF (EF 42-35%, T6RW, mod pHTN by TTE 10/29/2015), COPD on supplemental O2 via [place] as needed, CKD stage III, SLE on Plaquenil, IDDM, HTN, HLD, Hx of PE s/p Coumadin treatment, RBBB, and MGUS who is admitted for acute on chronic respiratory failure with hypoxia.   Sepsis with acute on chronic respiratory failure with hypoxia due to suspected viral pneumonia: Patient presented with hypoxia, fever, tachycardia, tachypnea, acute on chronic kidney injury, and chest x-ray findings suspicious for viral pneumonia.  Concern for COVID-19 infection, SARS-CoV-2 test is currently in process.  Lower suspicion for bacterial pneumonia, procalcitonin is elevated.  She was given IV Levaquin in the ED.  Inflammatory markers are elevated including D-dimer.  Consider evaluation for PE with VQ scan pending progress and further testing results, cannot obtain CTA due to renal function. -Admit to PUI telemetry bed -Follow-up SARS-CoV-2 test, add on influenza and respiratory viral panel -Continue supplemental O2,  maintain O2 sats 90-94% as to try not to over oxygenate in the setting of COPD -Continue Combivent and as needed albuterol inhaler -Monitor inflammatory markers -Start vitamin C, zinc -Follow blood cultures -Switch to oral Levaquin beginning tomorrow, de-escalate antibiotics as able -If SARS-CoV-2 positive - consider addition of steroids and remdesivir  Acute on chronic stage III kidney injury due to sepsis: Appears volume depleted on admission. -Give 500 mL normal saline once -Holding home Lasix, lisinopril-HCTZ for now -Monitor renal function  COPD: Continue Combivent, as needed albuterol, supplemental oxygen via [place] as above.  Chronic diastolic CHF: EF 43-15%, Q0GQ, mod pHTN by TTE 10/29/2015.  Appears volume depleted on admission.   -Given gentle fluids and holding home Lasix for now. Restart diuretics as soon as able. -Strict I/O's, daily weights -Monitor renal function, electrolytes  SLE: Continue home Plaquenil.  Insulin-dependent diabetes: Current home regimen unclear.  Serum glucose 143 on admission.  Will start sensitive SSI and check A1c, adjust insulin regimen as needed.  Hypertension: Mildly hypertensive on admission.  Will resume home diltiazem, hydralazine, Imdur, Lopressor tomorrow.  Holding Lasix and lisinopril-HCTZ as above.  Hyperlipidemia: Continue pravastatin.  Anemia of chronic disease: Currently stable, continue  to monitor.   DVT prophylaxis: Subcutaneous heparin Code Status: DNR, confirmed with patient Family Communication: Per patient request, discussed with caregiver Leisa Lenz by phone at [phone] Disposition Plan: Pending clinical progress Consults called: None Admission status: Inpatient for management of sepsis, acute on chronic respiratory failure with hypoxia, suspected viral pneumonia, acute kidney injury she will need close monitoring due to high risk for decompensation given her multiple comorbidities as documented above while  awaiting further cultural/infectious data.  Zada Finders MD [facility]  If 7PM-7AM, please contact night-coverage [email or web address]  02/15/2019, 9:13 PM

## 2019-02-15 NOTE — Progress Notes (Signed)
A consult was received from an ED physician for [name] [name] per pharmacy dosing.  The patient's profile has been reviewed for ht/wt/allergies/indication/available labs.   A one time order has been placed for Levaquin 500mg .  Further antibiotics/pharmacy consults should be ordered by admitting physician if indicated.                       Thank you, Kara Mead 02/15/2019  8:18 PM

## 2019-02-15 NOTE — ED Provider Notes (Addendum)
[facility] DEPT Provider Note   CSN: [id number] Arrival date & time: 02/15/19  1732     History   Chief Complaint Chief Complaint  Patient presents with  . Fever  . Weakness  . COVID exposure    HPI [name] [name] is a 78 y.o. female.  Presents emerge department with cough, chills.  Reportedly patient has been having weakness since yesterday, cough for the past couple days.  States cough is nonproductive, nonbloody.  No documented fever at home but has had chills.  Generalized weakness, nonfocal weakness.  Patient denies any pain.  She is alert, talking, answering questions appropriately; per EMS at baseline dementia.  Report from EMS patient had exposure to her caretaker that has been positive for COVID-19, last time patient saw caretaker was 7 days ago.     HPI  Past Medical History:  Diagnosis Date  . Allergic rhinitis   . Anxiety   . Arthritis   . Asthma   . Cancer [facility]) 2006   breast cancer right  . CHF (congestive heart failure) ([place])   . COPD (chronic obstructive pulmonary disease) ([place])   . Degenerative disc disease, lumbar   . Diabetes mellitus without complication ([place])   . Dyspnea    walking distances  . Eczema   . GERD (gastroesophageal reflux disease)   . Glaucoma   . History of home oxygen therapy    uses 2 liters ay night and prn  . Hyperlipidemia   . Hypertension   . Low back pain   . Lupus ([place])    skin  . Neck pain   . Numbness and tingling   . Obesity   . Osteopenia   . Pulmonary embolism ([place]) 01/2012    CT showed multi small PE and coumadized   . Sleep apnea 2010   no cpap used  . Systemic lupus erythematosus ([place])     Patient Active Problem List   Diagnosis Date Noted  . Acute on chronic respiratory failure with hypoxia ([place]) 02/15/2019  . Acute-on-chronic kidney injury ([place]) 02/15/2019  . Obesity (BMI 30-39.9) 10/31/2018  . CRI (chronic renal insufficiency), stage 3 (moderate) 10/31/2018  . Iron  deficiency anemia 04/10/2018  . 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 Abnormality of gait 08/05/2015  . Spinal stenosis of lumbar region 08/05/2015  . Peripheral neuropathy 08/05/2015  . Chest pain 12/28/2013  . Hyperlipidemia associated with type 2 diabetes mellitus ([place]) 04/05/2013  . Systemic lupus erythematosus ([place]) 04/05/2013  . Pulmonary embolism ([place]) 01/24/2012  . Chronic diastolic CHF (congestive heart failure) ([place]) 01/24/2012  . COPD (chronic obstructive pulmonary disease) ([place]) 01/24/2012  . Diabetes mellitus ([place]) 01/24/2012  . Hypertension associated with diabetes ([place]) 01/24/2012  . Leukocytosis 01/24/2012  . Bronchitis 01/24/2012  . Anemia 01/24/2012    Past Surgical History:  Procedure Laterality Date  . ABDOMINAL HYSTERECTOMY  1976  . BACK SURGERY  2006   lower  . BREAST BIOPSY Right   . BREAST EXCISIONAL BIOPSY Left   . BREAST LUMPECTOMY Right   . COLONOSCOPY WITH PROPOFOL N/A 03/23/2015   Procedure: COLONOSCOPY WITH PROPOFOL;  Surgeon: Laurence Spates, MD;  Location: [facility] ENDOSCOPY;  Service: Endoscopy;  Laterality: N/A;  . Dobtamine myoview  05/02/2008   EF 67% ; LV norm  . DOPPLER ECHOCARDIOGRAPHY  01/25/2012   EF 55 TO 60%; LV norm.  . EXPLORATORY LAPAROTOMY    . EYE SURGERY Bilateral 2010   lens reaplcments for cataracts   .  Lower Extrem. venous doppler  01/25/2012    neg.  . TOE SURGERY  1996   Bunion     OB History   No obstetric history on file.      Home Medications    Prior to Admission medications   Medication Sig Start Date End Date Taking? Authorizing Provider  acetaminophen (TYLENOL) 650 MG CR tablet Take 1,300 mg by mouth every 8 (eight) hours as needed for pain.    [provider]  albuterol (PROVENTIL HFA;VENTOLIN HFA) 108 (90 BASE) MCG/ACT inhaler Inhale 2 puffs into the lungs See admin instructions. Every 6 hours as needed for SOB but also takes 2 puffs BID scheduled.    [provider]  albuterol  (PROVENTIL) (2.5 MG/3ML) 0.083% nebulizer solution Take 2.5 mg by nebulization every 6 (six) hours as needed for wheezing or shortness of breath.     [provider]  alendronate (FOSAMAX) 70 MG tablet Take 70 mg by mouth every Monday. Take with a full glass of water on an empty stomach.    [provider]  allopurinol (ZYLOPRIM) 100 MG tablet Take 100 mg by mouth daily.    [provider]  aspirin 325 MG tablet Take 325 mg by mouth daily.    [provider]  brimonidine (ALPHAGAN P) 0.1 % SOLN Place 1 drop into both eyes 2 (two) times a day.    [provider]  budesonide (PULMICORT) 0.5 MG/2ML nebulizer solution Take 0.5 mg by nebulization every 6 (six) hours as needed (For wheezing.).     [provider]  calcium carbonate (OS-CAL) 1250 (500 Ca) MG chewable tablet Chew 1 tablet by mouth daily.    [provider]  cetirizine (ZYRTEC) 10 MG tablet Take 10 mg by mouth daily.    [provider]  cholecalciferol (VITAMIN D) 1000 UNITS tablet Take 1,000 Units by mouth daily.    [provider]  citalopram (CELEXA) 10 MG tablet Take 10 mg by mouth daily.     [provider]  clobetasol ointment (TEMOVATE) 5.44 % Apply 1 application topically 2 (two) times daily.     [provider]  COLCRYS 0.6 MG tablet Take 1 tablet by mouth twice daily 12/31/18   Jessy Oto, MD  desonide (DESOWEN) 0.05 % cream Apply 1 application topically 2 (two) times daily.     [provider]  dexlansoprazole (DEXILANT) 60 MG capsule Take 60 mg by mouth daily.     [provider]  diclofenac sodium (VOLTAREN) 1 % GEL Apply 2 g topically daily. Reported on 09/15/2015    [provider]  diltiazem (CARTIA XT) 300 MG 24 hr capsule Take 300 mg by mouth daily. Reported on 09/15/2015    [provider]  diphenhydrAMINE (BENADRYL) 25 MG tablet Take 25 mg by mouth every 6 (six) hours as needed for  itching.     [provider]  furosemide (LASIX) 80 MG tablet Take 1 tablet (80 mg total) by mouth 2 (two) times daily. 02/04/19   Lorretta Harp, MD  gabapentin (NEURONTIN) 300 MG capsule Take 1 capsule (300 mg total) by mouth 2 (two) times daily. 04/05/18   Jessy Oto, MD  hydrALAZINE (APRESOLINE) 50 MG tablet Take 50 mg by mouth 3 (three) times daily.     [provider]  hydroxychloroquine (PLAQUENIL) 200 MG tablet Take 200 mg by mouth daily.     [provider]  insulin aspart (NOVOLOG) 100 UNIT/ML injection Inject  10 Units into the skin 3 (three) times daily before meals.     [provider]  insulin glargine (LANTUS) 100 UNIT/ML injection Inject 60-70 Units into the skin 2 (two) times daily. She takes 60 units in the morning and 70 units at bedtime. 01/29/12   Rai, Vernelle Emerald, MD  isosorbide mononitrate (IMDUR) 30 MG 24 hr tablet Take 30 mg by mouth every morning.    [provider]  ketotifen (ALAWAY) 0.025 % ophthalmic solution Place 1 drop into both eyes daily.     [provider]  lisinopril-hydrochlorothiazide (ZESTORETIC) 20-12.5 MG tablet  11/12/18   [provider]  LORazepam (ATIVAN) 0.5 MG tablet Take 1 tablet (0.5 mg total) by mouth every 4 (four) hours as needed for anxiety (For anxiety with PET-CT or MRI imaging). 05/10/17   Ardath Sax, MD  metoprolol tartrate (LOPRESSOR) 50 MG tablet Take 1 tablet by mouth twice daily 01/16/19   Kroeger, Lorelee Cover., PA-C  oxybutynin (DITROPAN-XL) 10 MG 24 hr tablet Take 10 mg by mouth daily. 11/11/18   [provider]  OXYGEN Inhale into the lungs. 2L/min - prn during day and every evening.    [provider]  pravastatin (PRAVACHOL) 40 MG tablet Take 40 mg by mouth at bedtime.     [provider]  SYMBICORT 160-4.5 MCG/ACT inhaler Inhale 2 puffs into the lungs 2 (two) times daily. Reported on 09/15/2015 04/22/13   [provider]  tizanidine  (ZANAFLEX) 2 MG capsule Take 2 mg by mouth 2 (two) times daily.    [provider]  traMADol (ULTRAM) 50 MG tablet Take 1 tablet (50 mg total) by mouth every 6 (six) hours as needed for severe pain. 04/05/18   Jessy Oto, MD  trimethoprim (TRIMPEX) 100 MG tablet Take 100 mg by mouth daily. 11/23/18   [provider]  vitamin B-12 (CYANOCOBALAMIN) 1000 MCG tablet Take 1,000 mcg by mouth daily. Reported on 09/15/2015    [provider]    Family History Family History  Problem Relation Age of Onset  . Heart failure Father   . Heart failure Mother   . Diabetes Brother   . Breast cancer Maternal Aunt   . Breast cancer Paternal Aunt     Social History Social History   Tobacco Use  . Smoking status: Former Research scientist (life sciences)  . Smokeless tobacco: Never Used  . Tobacco comment: Quit 1980  Substance Use Topics  . Alcohol use: No    Alcohol/week: 0.0 standard drinks  . Drug use: No     Allergies   Penicillins, Fentanyl, Peach [prunus persica], and Shellfish allergy   Review of Systems Review of Systems  Constitutional: Positive for chills and fever.  HENT: Negative for ear pain and sore throat.   Eyes: Negative for pain and visual disturbance.  Respiratory: Positive for cough and shortness of breath.   Cardiovascular: Negative for chest pain and palpitations.  Gastrointestinal: Negative for abdominal pain and vomiting.  Genitourinary: Negative for dysuria and hematuria.  Musculoskeletal: Negative for arthralgias and back pain.  Skin: Negative for color change and rash.  Neurological: Negative for seizures and syncope.  All other systems reviewed and are negative.    Physical Exam Updated Vital Signs BP (!) 145/66   Pulse (!) 112   Temp (!) 101.4 F (38.6 C) (Rectal)   Resp (!) 28   SpO2 100%   Physical Exam Vitals signs and nursing note reviewed.  Constitutional:  General: She is not in acute distress.    Appearance: She is well-developed.   HENT:     Head: Normocephalic and atraumatic.     Mouth/Throat:     Mouth: Mucous membranes are dry.  Eyes:     Conjunctiva/sclera: Conjunctivae normal.  Neck:     Musculoskeletal: Neck supple.  Cardiovascular:     Rate and Rhythm: Regular rhythm. Tachycardia present.     Heart sounds: No murmur.  Pulmonary:     Comments: Coarse breath sounds bilaterally, mild tachypnea, mild increase in effort Abdominal:     Palpations: Abdomen is soft.     Tenderness: There is no abdominal tenderness.  Musculoskeletal:        General: No swelling or tenderness.  Skin:    General: Skin is warm and dry.     Capillary Refill: Capillary refill takes less than 2 seconds.  Neurological:     General: No focal deficit present.     Mental Status: She is alert.  Psychiatric:        Mood and Affect: Mood normal.        Behavior: Behavior normal.      ED Treatments / Results  Labs (all labs ordered are listed, but only abnormal results are displayed) Labs Reviewed  CBC WITH DIFFERENTIAL/PLATELET - Abnormal; Notable for the following components:      Result Value   RBC 3.70 (*)    Hemoglobin 10.6 (*)    HCT 34.8 (*)    All other components within normal limits  COMPREHENSIVE METABOLIC PANEL - Abnormal; Notable for the following components:   CO2 19 (*)    Glucose, Bld 143 (*)    BUN 57 (*)    Creatinine, Ser 2.63 (*)    Albumin 3.4 (*)    AST 56 (*)    GFR calc non Af Amer 17 (*)    GFR calc Af Amer 20 (*)    All other components within normal limits  D-DIMER, QUANTITATIVE (NOT AT [facility]) - Abnormal; Notable for the following components:   D-Dimer, Quant 3.99 (*)    All other components within normal limits  LACTATE DEHYDROGENASE - Abnormal; Notable for the following components:   LDH 265 (*)    All other components within normal limits  FERRITIN - Abnormal; Notable for the following components:   Ferritin 686 (*)    All other components within normal limits  FIBRINOGEN - Abnormal;  Notable for the following components:   Fibrinogen 541 (*)    All other components within normal limits  C-REACTIVE PROTEIN - Abnormal; Notable for the following components:   CRP 6.8 (*)    All other components within normal limits  TROPONIN I (HIGH SENSITIVITY) - Abnormal; Notable for the following components:   Troponin I (High Sensitivity) 64 (*)    All other components within normal limits  SARS CORONAVIRUS 2 (TAT 6-24 HRS)  CULTURE, BLOOD (ROUTINE X 2)  CULTURE, BLOOD (ROUTINE X 2)  LACTIC ACID, PLASMA  PROCALCITONIN  TRIGLYCERIDES  LACTIC ACID, PLASMA  URINALYSIS, ROUTINE W REFLEX MICROSCOPIC  TROPONIN I (HIGH SENSITIVITY)    EKG EKG Interpretation  Date/Time:  Friday February 15 2019 18:36:59 EDT Ventricular Rate:  114 PR Interval:    QRS Duration: 58 QT Interval:  359 QTC Calculation: 495 R Axis:   -157 Text Interpretation: Sinus tachycardia No acute STEMI Confirmed by Madalyn Rob [phone]) on 02/15/2019 6:51:30 PM   Radiology Dg Chest Port 1 View  Result Date:  02/15/2019 CLINICAL DATA:  Viral pneumonia EXAM: PORTABLE CHEST 1 VIEW COMPARISON:  12/22/2014 FINDINGS: The heart size is enlarged. Aortic calcifications are noted. There are scattered bilateral ground-glass airspace opacities, greatest within the left lower lobe. There is no pneumothorax. No large pleural effusion. There are end-stage degenerative changes of the left glenohumeral joint. There is no acute osseous abnormality. IMPRESSION: Bilateral ground-glass airspace opacities which can be seen in patients with viral pneumonia. Electronically Signed   By: Constance Holster M.D.   On: 02/15/2019 19:25    Procedures .Critical Care Performed by: Lucrezia Starch, MD Authorized by: Lucrezia Starch, MD   Critical care provider statement:    Critical care time (minutes):  45   Critical care was necessary to treat or prevent imminent or life-threatening deterioration of the following conditions:   Respiratory failure and sepsis   Critical care was time spent personally by me on the following activities:  Discussions with consultants, evaluation of patient's response to treatment, examination of patient, ordering and performing treatments and interventions, ordering and review of laboratory studies, ordering and review of radiographic studies, pulse oximetry, re-evaluation of patient's condition, obtaining history from patient or surrogate and review of old charts   (including critical care time)  Medications Ordered in ED Medications  ibuprofen (ADVIL) tablet 600 mg (600 mg Oral Given 02/15/19 1839)     Initial Impression / Assessment and Plan / ED Course  I have reviewed the triage vital signs and the nursing notes.  Pertinent labs & imaging results that were available during my care of the patient were reviewed by me and considered in my medical decision making (see chart for details).  Clinical Course as of Feb 14 2002  Fri Feb 15, 2019  1944 Reviewed labs, will consult hospitalist for admission   [RD]  1959 Discussed with Posey Pronto who agrees to admit patient, reviewed labs, procalcitonin came back and is quite elevated, will start antibiotics while awaiting Covid testing   [RD]  1959 Patient has a bad penicillin allergy, will treat with levofloxacin   [RD]    Clinical Course User Index [RD] Lucrezia Starch, MD       78 year old lady presents to ER with cough, weakness.  Here she was noted to be tachycardic, febrile, borderline O2 saturations with EMS.  Work-up was concerning for pneumonia, likely viral pattern on CXR.  Given Covid exposure, believe this is most likely diagnosis at this time.  Patient does not qualify for rapid Covid testing at this time, her procalcitonin is elevated, will cover for possible bacterial etiology with antibiotics.  Patient has severe penicillin allergy, will start Levaquin.  Discussed case with Posey Pronto who agrees to admit patient.  Final  Clinical Impressions(s) / ED Diagnoses   Final diagnoses:  Viral pneumonia  Suspected COVID-19 virus infection  Fever, unspecified fever cause  SIRS (systemic inflammatory response syndrome) (HCC)  Acute respiratory failure with hypoxia (HCC)  AKI (acute kidney injury) [facility])    ED Discharge Orders    None       Lucrezia Starch, MD 02/15/19 2003    Lucrezia Starch, MD 02/15/19 2004

## 2019-02-15 NOTE — ED Triage Notes (Signed)
Per EMS: Pt c/o of weakness starting yesterday.  Pt's caretaker tested positive for COVID 7 days ago (the last time pt has seen caretaker).  Pt hx of dementia, pt at baseline,   Temp 101.7 temporal 168/90 RR 34 SPO2 85% RA.  98 % 2 L [place] 136 CBG ETCO2 30-32 1000 mg tylenol given 100 mL NS 20 gauge R hand

## 2019-02-15 NOTE — Progress Notes (Signed)
Pt arrived to unit via stretcher, alert and oriented to name, place, and situation but thinks it is September, skin intact, purewick in place, hooked up to cannister suction, iv infusing in patent right hand site, lungs diminished with some expiratory wheezes noted throughout, no SOB noted, no cough presently, on RA, will attempt to obtain as much admission info as possible given hx of dementia, airborne and contact precautions initiated, patient wearing mask during transport, covid test pending, bed in low position with alarm set, instructed on call bell use, tele monitor applied, will monitor.

## 2019-02-16 DIAGNOSIS — E1159 Type 2 diabetes mellitus with other circulatory complications: Secondary | ICD-10-CM

## 2019-02-16 DIAGNOSIS — U071 COVID-19: Secondary | ICD-10-CM | POA: Diagnosis present

## 2019-02-16 DIAGNOSIS — I10 Essential (primary) hypertension: Secondary | ICD-10-CM

## 2019-02-16 DIAGNOSIS — N179 Acute kidney failure, unspecified: Secondary | ICD-10-CM

## 2019-02-16 DIAGNOSIS — I5032 Chronic diastolic (congestive) heart failure: Secondary | ICD-10-CM

## 2019-02-16 DIAGNOSIS — N1831 Chronic kidney disease, stage 3a: Secondary | ICD-10-CM

## 2019-02-16 DIAGNOSIS — J069 Acute upper respiratory infection, unspecified: Secondary | ICD-10-CM | POA: Diagnosis present

## 2019-02-16 LAB — COMPREHENSIVE METABOLIC PANEL
ALT: 33 U/L (ref 0–44)
ALT: 31 U/L (ref 0–44)
AST: 54 U/L — ABNORMAL HIGH (ref 15–41)
AST: 55 U/L — ABNORMAL HIGH (ref 15–41)
Albumin: 3.2 g/dL — ABNORMAL LOW (ref 3.5–5.0)
Albumin: 3 g/dL — ABNORMAL LOW (ref 3.5–5.0)
Alkaline Phosphatase: 59 U/L (ref 38–126)
Alkaline Phosphatase: 58 U/L (ref 38–126)
Anion gap: 10 (ref 5–15)
Anion gap: 9 (ref 5–15)
BUN: 60 mg/dL — ABNORMAL HIGH (ref 8–23)
BUN: 59 mg/dL — ABNORMAL HIGH (ref 8–23)
CO2: 20 mmol/L — ABNORMAL LOW (ref 22–32)
CO2: 18 mmol/L — ABNORMAL LOW (ref 22–32)
Calcium: 8.8 mg/dL — ABNORMAL LOW (ref 8.9–10.3)
Calcium: 8.6 mg/dL — ABNORMAL LOW (ref 8.9–10.3)
Chloride: 105 mmol/L (ref 98–111)
Chloride: 107 mmol/L (ref 98–111)
Creatinine, Ser: 2.74 mg/dL — ABNORMAL HIGH (ref 0.44–1.00)
Creatinine, Ser: 2.52 mg/dL — ABNORMAL HIGH (ref 0.44–1.00)
GFR calc Af Amer: 19 mL/min — ABNORMAL LOW (ref >60)
GFR calc Af Amer: 21 mL/min — ABNORMAL LOW (ref >60)
GFR calc non Af Amer: 16 mL/min — ABNORMAL LOW (ref >60)
GFR calc non Af Amer: 18 mL/min — ABNORMAL LOW (ref >60)
Glucose, Bld: 119 mg/dL — ABNORMAL HIGH (ref 70–99)
Glucose, Bld: 135 mg/dL — ABNORMAL HIGH (ref 70–99)
Potassium: 3.7 mmol/L (ref 3.5–5.1)
Potassium: 4 mmol/L (ref 3.5–5.1)
Sodium: 135 mmol/L (ref 135–145)
Sodium: 134 mmol/L — ABNORMAL LOW (ref 135–145)
Total Bilirubin: 0.4 mg/dL (ref 0.3–1.2)
Total Bilirubin: 0.5 mg/dL (ref 0.3–1.2)
Total Protein: 7 g/dL (ref 6.5–8.1)
Total Protein: 6.7 g/dL (ref 6.5–8.1)

## 2019-02-16 LAB — FERRITIN
Ferritin: 849 ng/mL — ABNORMAL HIGH (ref 11–307)
Ferritin: 681 ng/mL — ABNORMAL HIGH (ref 11–307)

## 2019-02-16 LAB — INFLUENZA PANEL BY PCR (TYPE A & B)
Influenza A By PCR: NEGATIVE
Influenza B By PCR: NEGATIVE

## 2019-02-16 LAB — MAGNESIUM
Magnesium: 1.4 mg/dL — ABNORMAL LOW (ref 1.7–2.4)
Magnesium: 1.5 mg/dL — ABNORMAL LOW (ref 1.7–2.4)

## 2019-02-16 LAB — CBC
HCT: 33.7 % — ABNORMAL LOW (ref 36.0–46.0)
Hemoglobin: 10 g/dL — ABNORMAL LOW (ref 12.0–15.0)
MCH: 29.2 pg (ref 26.0–34.0)
MCHC: 29.7 g/dL — ABNORMAL LOW (ref 30.0–36.0)
MCV: 98.5 fL (ref 80.0–100.0)
Platelets: 141 10*3/uL — ABNORMAL LOW (ref 150–400)
RBC: 3.42 MIL/uL — ABNORMAL LOW (ref 3.87–5.11)
RDW: 13.9 % (ref 11.5–15.5)
WBC: 4.1 10*3/uL (ref 4.0–10.5)
nRBC: 0 % (ref 0.0–0.2)

## 2019-02-16 LAB — RETICULOCYTES
Immature Retic Fract: 7.8 % (ref 2.3–15.9)
RBC.: 3.55 MIL/uL — ABNORMAL LOW (ref 3.87–5.11)
Retic Count, Absolute: 21.3 10*3/uL (ref 19.0–186.0)
Retic Ct Pct: 0.6 % (ref 0.4–3.1)

## 2019-02-16 LAB — FOLATE: Folate: 8.2 ng/mL (ref >5.9)

## 2019-02-16 LAB — GLUCOSE, CAPILLARY
Glucose-Capillary: 84 mg/dL (ref 70–99)
Glucose-Capillary: 73 mg/dL (ref 70–99)
Glucose-Capillary: 122 mg/dL — ABNORMAL HIGH (ref 70–99)
Glucose-Capillary: 158 mg/dL — ABNORMAL HIGH (ref 70–99)
Glucose-Capillary: 220 mg/dL — ABNORMAL HIGH (ref 70–99)

## 2019-02-16 LAB — D-DIMER, QUANTITATIVE: D-Dimer, Quant: 3.09 ug/mL-FEU — ABNORMAL HIGH (ref 0.00–0.50)

## 2019-02-16 LAB — C-REACTIVE PROTEIN
CRP: 7 mg/dL — ABNORMAL HIGH (ref <1.0)
CRP: 8 mg/dL — ABNORMAL HIGH (ref <1.0)

## 2019-02-16 LAB — PHOSPHORUS: Phosphorus: 4.2 mg/dL (ref 2.5–4.6)

## 2019-02-16 LAB — HEMOGLOBIN A1C
Hgb A1c MFr Bld: 8.9 % — ABNORMAL HIGH (ref 4.8–5.6)
Mean Plasma Glucose: 208.73 mg/dL

## 2019-02-16 LAB — PROCALCITONIN
Procalcitonin: 2.38 ng/mL
Procalcitonin: 2.44 ng/mL

## 2019-02-16 LAB — VITAMIN B12: Vitamin B-12: 4316 pg/mL — ABNORMAL HIGH (ref 180–914)

## 2019-02-16 LAB — IRON AND TIBC
Iron: 17 ug/dL — ABNORMAL LOW (ref 28–170)
Saturation Ratios: 10 % — ABNORMAL LOW (ref 10.4–31.8)
TIBC: 162 ug/dL — ABNORMAL LOW (ref 250–450)
UIBC: 145 ug/dL

## 2019-02-16 LAB — TROPONIN I (HIGH SENSITIVITY): Troponin I (High Sensitivity): 73 ng/L — ABNORMAL HIGH (ref <18)

## 2019-02-16 LAB — SARS CORONAVIRUS 2 (TAT 6-24 HRS): SARS Coronavirus 2: POSITIVE — AB

## 2019-02-16 MED ORDER — LEVALBUTEROL TARTRATE 45 MCG/ACT IN AERO
2.0000 | INHALATION_SPRAY | RESPIRATORY_TRACT | Status: DC | PRN
  Administered 2019-02-21: 10:00:00 2 via RESPIRATORY_TRACT
  Filled 2019-02-16: qty 15

## 2019-02-16 MED ORDER — KETOTIFEN FUMARATE 0.025 % OP SOLN: 1.0000 [drp] | Freq: Every day | OPHTHALMIC | Status: DC

## 2019-02-16 MED ORDER — CLOBETASOL PROPIONATE 0.05 % EX OINT
1.0000 "application " | TOPICAL_OINTMENT | Freq: Two times a day (BID) | CUTANEOUS | Status: DC
  Administered 2019-02-16 – 2019-02-21 (×10): 1 via TOPICAL
  Filled 2019-02-16: qty 15

## 2019-02-16 MED ORDER — METHYLPREDNISOLONE SODIUM SUCC 125 MG IJ SOLR
60.0000 mg | Freq: Two times a day (BID) | INTRAMUSCULAR | Status: DC
  Administered 2019-02-16 – 2019-02-17 (×2): 60 mg via INTRAVENOUS
  Filled 2019-02-16 (×2): qty 2

## 2019-02-16 MED ORDER — LEVOFLOXACIN 500 MG PO TABS
500.0000 mg | ORAL_TABLET | ORAL | Status: AC
  Administered 2019-02-17 – 2019-02-19 (×2): 500 mg via ORAL
  Filled 2019-02-16 (×3): qty 1

## 2019-02-16 MED ORDER — MAGNESIUM SULFATE 2 GM/50ML IV SOLN
2.0000 g | Freq: Once | INTRAVENOUS | Status: AC
  Administered 2019-02-16: 01:00:00 2 g via INTRAVENOUS
  Filled 2019-02-16: qty 50

## 2019-02-16 MED ORDER — ALENDRONATE SODIUM 70 MG PO TABS: 70.0000 mg | ORAL_TABLET | ORAL | Status: DC

## 2019-02-16 MED ORDER — MOMETASONE FURO-FORMOTEROL FUM 200-5 MCG/ACT IN AERO
2.0000 | INHALATION_SPRAY | Freq: Two times a day (BID) | RESPIRATORY_TRACT | Status: DC
  Administered 2019-02-16 – 2019-02-21 (×10): 2 via RESPIRATORY_TRACT
  Filled 2019-02-16: qty 8.8

## 2019-02-16 MED ORDER — CALCIUM CARBONATE 1250 (500 CA) MG PO TABS
1250.0000 mg | ORAL_TABLET | Freq: Every day | ORAL | Status: DC
  Administered 2019-02-17 – 2019-02-21 (×5): 1250 mg via ORAL
  Filled 2019-02-16 (×6): qty 1

## 2019-02-16 MED ORDER — ASPIRIN 325 MG PO TABS
325.0000 mg | ORAL_TABLET | Freq: Every day | ORAL | Status: DC
  Administered 2019-02-17 – 2019-02-21 (×5): 325 mg via ORAL
  Filled 2019-02-16 (×6): qty 1

## 2019-02-16 MED ORDER — LORATADINE 10 MG PO TABS
10.0000 mg | ORAL_TABLET | Freq: Every day | ORAL | Status: DC
  Administered 2019-02-17 – 2019-02-21 (×5): 10 mg via ORAL
  Filled 2019-02-16 (×5): qty 1

## 2019-02-16 MED ORDER — DIPHENHYDRAMINE HCL 25 MG PO CAPS: 25.0000 mg | ORAL_CAPSULE | Freq: Four times a day (QID) | ORAL | Status: DC | PRN

## 2019-02-16 MED ORDER — GABAPENTIN 300 MG PO CAPS
300.0000 mg | ORAL_CAPSULE | Freq: Two times a day (BID) | ORAL | Status: DC
  Administered 2019-02-16 – 2019-02-21 (×10): 300 mg via ORAL
  Filled 2019-02-16 (×10): qty 1

## 2019-02-16 MED ORDER — OXYBUTYNIN CHLORIDE ER 10 MG PO TB24
10.0000 mg | ORAL_TABLET | Freq: Every day | ORAL | Status: DC
  Administered 2019-02-17 – 2019-02-21 (×5): 10 mg via ORAL
  Filled 2019-02-16: qty 2
  Filled 2019-02-16 (×5): qty 1

## 2019-02-16 MED ORDER — SODIUM CHLORIDE 0.9 % IV SOLN
INTRAVENOUS | Status: DC
  Administered 2019-02-16 – 2019-02-17 (×3): via INTRAVENOUS

## 2019-02-16 MED ORDER — METHYLPREDNISOLONE SODIUM SUCC 125 MG IJ SOLR
60.0000 mg | Freq: Two times a day (BID) | INTRAMUSCULAR | Status: DC
  Administered 2019-02-16: 12:00:00 60 mg via INTRAVENOUS
  Filled 2019-02-16: qty 2

## 2019-02-16 MED ORDER — BRIMONIDINE TARTRATE 0.15 % OP SOLN
1.0000 [drp] | Freq: Two times a day (BID) | OPHTHALMIC | Status: DC
  Administered 2019-02-16 – 2019-02-21 (×10): 1 [drp] via OPHTHALMIC
  Filled 2019-02-16: qty 5

## 2019-02-16 MED ORDER — SODIUM CHLORIDE 0.9 % IV SOLN
200.0000 mg | Freq: Once | INTRAVENOUS | Status: AC
  Administered 2019-02-16: 200 mg via INTRAVENOUS
  Filled 2019-02-16: qty 40

## 2019-02-16 MED ORDER — PANTOPRAZOLE SODIUM 40 MG PO TBEC
40.0000 mg | DELAYED_RELEASE_TABLET | Freq: Every day | ORAL | Status: DC
  Administered 2019-02-17 – 2019-02-21 (×5): 40 mg via ORAL
  Filled 2019-02-16 (×5): qty 1

## 2019-02-16 MED ORDER — ALLOPURINOL 100 MG PO TABS
100.0000 mg | ORAL_TABLET | Freq: Every day | ORAL | Status: DC
  Administered 2019-02-17 – 2019-02-21 (×5): 100 mg via ORAL
  Filled 2019-02-16 (×5): qty 1

## 2019-02-16 MED ORDER — DICLOFENAC SODIUM 1 % TD GEL: 2.0000 g | Freq: Every day | TRANSDERMAL | Status: DC

## 2019-02-16 MED ORDER — VITAMIN D 25 MCG (1000 UNIT) PO TABS
1000.0000 [IU] | ORAL_TABLET | Freq: Every day | ORAL | Status: DC
  Administered 2019-02-17 – 2019-02-21 (×5): 1000 [IU] via ORAL
  Filled 2019-02-16 (×5): qty 1

## 2019-02-16 MED ORDER — CITALOPRAM HYDROBROMIDE 10 MG PO TABS
10.0000 mg | ORAL_TABLET | Freq: Every day | ORAL | Status: DC
  Administered 2019-02-17 – 2019-02-21 (×5): 10 mg via ORAL
  Filled 2019-02-16 (×5): qty 1

## 2019-02-16 MED ORDER — SODIUM CHLORIDE 0.9 % IV SOLN
100.0000 mg | INTRAVENOUS | Status: AC
  Administered 2019-02-17 – 2019-02-20 (×4): 100 mg via INTRAVENOUS
  Filled 2019-02-16 (×5): qty 20

## 2019-02-16 MED ORDER — COLCHICINE 0.6 MG PO TABS
0.6000 mg | ORAL_TABLET | Freq: Two times a day (BID) | ORAL | Status: DC
  Administered 2019-02-16 – 2019-02-19 (×6): 0.6 mg via ORAL
  Filled 2019-02-16 (×8): qty 1

## 2019-02-16 NOTE — Progress Notes (Signed)
PHARMACY NOTE:  ANTIMICROBIAL RENAL DOSAGE ADJUSTMENT  Current antimicrobial regimen includes a mismatch between antimicrobial dosage and estimated renal function.  As per policy approved by the Pharmacy & Therapeutics and Medical Executive Committees, the antimicrobial dosage will be adjusted accordingly.  Current antimicrobial dosage:  levaquin 500 mg daily  Indication: possible PNA, COVID +   Renal Function:  Estimated Creatinine Clearance: 19.8 mL/min (A) (by C-G formula based on SCr of 2.74 mg/dL (H)). []      On intermittent HD, scheduled: []      On CRRT    Antimicrobial dosage has been changed to:  Levaquin 500 mg IV q48h   Thank you for allowing pharmacy to be a part of this patient's care.  [place], [place], [facility] 02/16/2019 4:02 AM

## 2019-02-16 NOTE — Plan of Care (Signed)

## 2019-02-16 NOTE — Progress Notes (Signed)
Spoke with Bodenheimer, NP,he will make rounding physician aware of +covid results in the morning, patient to remain on [place] and not transfer to [facility] at this time.

## 2019-02-16 NOTE — Progress Notes (Addendum)
PROGRESS NOTE    [name] [name]  YBW:[id number] DOB: 1940-10-17 DOA: 02/15/2019 PCP: Nolene Ebbs, MD    Brief Narrative:  HPI per Dr Juanetta Gosling is a 78 y.o. 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  History limited from patient due to somnolence, otherwise supplemented from EDP, chart review, and caretaker by phone.  Caretaker states that she (the caretaker) was tested for COVID-19 on 02/07/2019 and found out she was positive on 02/08/2019.  She was in contact with patient prior to positive result but has been quarantining since then and speaking to her by phone/video call since then.  She states that patient had one episode of vomiting about a week ago and reported 1 episode of bowel incontinence in the interim but otherwise had no complaints.  Today home health nurse came to visit the patient and noted that she had a fever.  EMS were called and per triage notes temperature is 101.7 F with SPO2 85% on room air which improved to 98% on 2 L supplemental O2 via [place].  She is brought to the ED for further evaluation.  Per caretaker, patient normally ambulates on her own and is usually alert and oriented and communicates well.  She has no known history of dementia per caretaker.  At time of admission, patient is somnolent but will intermittently awaken to voice and light touch.  She is oriented to self, place, and knows the current president.  She states the year is 2006.  She otherwise nods her head yes to all other questions.  ED Course:  Initial vitals showed BP 145/66, pulse 109, RR 27, temp 101.4 Fahrenheit rectally, SPO2 85% on room air per triage notes improved to 100% on 2 L supplemental O2 via [place].  Labs are  notable for BUN 57, creatinine 2.63, EGFR 20, sodium 136, potassium 3.7, bicarb 19, serum glucose 143, WBC 5.4, hemoglobin 10.6, platelets 204,000, lactic acid 1.0, LDH 265, ferritin 686, fibrinogen 541, CRP 6.8, high-sensitivity troponin I 64, procalcitonin 1.71.  Blood cultures were obtained and pending.  SARS-CoV-2 test was obtained and pending.  Portable chest x-ray showed scattered bilateral groundglass airspace opacities.  Patient was given Advil and started on empiric IV antibiotics with Levaquin (penicillin allergy).  The hospitalist service was consulted to admit for further evaluation and management.  Assessment & Plan:   Principal Problem:   Acute respiratory disease due to COVID-19 virus Active Problems:   Chronic diastolic CHF (congestive heart failure) (HCC)   COPD (chronic obstructive pulmonary disease) (HCC)   Diabetes mellitus ([place])   Hypertension associated with diabetes ([place])   Hyperlipidemia associated with type 2 diabetes mellitus ([place])   Systemic lupus erythematosus (HCC)   Acute on chronic respiratory failure with hypoxia (HCC)   Acute-on-chronic kidney injury ([place])   Sepsis ([place])  #1 sepsis with acute on chronic respiratory distress secondary to COVID-19 virus infection Patient had presented with fevers, lethargy, hypoxia with sats of 85% on room air that improved to 98% on 2 L.  Work-up in the ED with chest x-ray did show scattered bilateral groundglass airspace opacities.  Procalcitonin level obtained was 1.71.  Ferritin was 849.  Fibrinogen of 541.  WBC of 5.4.  CRP elevated  at 7.0.  Procalcitonin was 2.38.  COVID-19 was positive.  Will place patient on IV Solu-Medrol and IV remdesivir, vitamin C and zinc.  IV fluids.  Continue Levaquin.  Supportive care.  2.  Acute renal failure on chronic kidney disease stage III Likely secondary to prerenal azotemia in the setting of diuretics, ACE inhibitor.  Order UA with cultures and sensitivities.  Check a fractional  excretion of sodium.  Hold diuretics and ACE inhibitor.  Place on gentle IV fluid hydration.  Follow.  3.  COPD Stable.  Continue Combivent.  Albuterol as needed.  O2.  4.  Chronic diastolic CHF Patient looks more on the dry side.  EF of 55 to 60% with grade 1 diastolic dysfunction per 2D echo 10/29/2015.  Patient volume depleted on examination with extremely dry mucous membranes.  Continue to hold diuretics.  Continue Lopressor, Pravachol, Imdur, hydralazine, diltiazem, gentle hydration and monitor closely for volume overload.  Strict I's and O's.  Daily weights.  Follow.  5.  SLE Continue Plaquenil.  6.  Insulin-dependent diabetes mellitus Hemoglobin A1c 8.9.  CBG of 73 this morning.  Continue sliding scale insulin.  7.  Hypertension Continue home regimen diltiazem, hydralazine, Imdur, Lopressor.  Continue to hold diuretic and ACE inhibitor secondary to acute on chronic kidney disease.  8.  Hyperlipidemia Continue statin.  9.  Anemia of chronic disease Stable.  Follow H&H.   DVT prophylaxis: Heparin Code Status: DNR Family Communication: Updated patient.  No family at bedside. Disposition Plan: Transfer to [facility]   Consultants:   None  Procedures:   Chest x-ray 02/15/2019    Antimicrobials:   Levaquin 02/15/2019   Subjective: Patient lethargic however opens eyes to verbal stimuli.  Alert and oriented to self and place.  Thinks it is November.  Denies any chest pain or shortness of breath.  Feeling very weak.  Objective: Vitals:   02/15/19 2209 02/16/19 0003 02/16/19 0203 02/16/19 0626  BP: (!) 152/67 (!) 155/70 (!) 142/63 (!) 147/76  Pulse: (!) 115 (!) 102 (!) 102 (!) 101  Resp: (!) 22 (!) '22 20 18  '$ Temp: 99.8 F (37.7 C) 98.9 F (37.2 C) 98.9 F (37.2 C) 98.2 F (36.8 C)  TempSrc:      SpO2: 97% 98% 95% 97%  Weight:      Height:        Intake/Output Summary (Last 24 hours) at 02/16/2019 1034 Last data filed at 02/16/2019 0928 Gross per 24 hour   Intake 872.15 ml  Output -  Net 872.15 ml   Filed Weights   02/15/19 2204  Weight: 100.7 kg    Examination:  General exam: NAD.  Extremely dry mucous membranes. Respiratory system: Clear to auscultation anterior lung fields.Marland Kitchen Respiratory effort normal. Cardiovascular system: S1 & S2 heard, RRR. No JVD, murmurs, rubs, gallops or clicks. No pedal edema. Gastrointestinal system: Abdomen is nondistended, soft and nontender. No organomegaly or masses felt. Normal bowel sounds heard. Central nervous system: Alert and oriented. No focal neurological deficits. Extremities: Symmetric 5 x 5 power. Skin: No rashes, lesions or ulcers Psychiatry: Judgement and insight appear normal. Mood & affect appropriate.     Data Reviewed: I have personally reviewed following labs and imaging studies  CBC: Recent Labs  Lab 02/15/19 1810 02/15/19 2246  WBC 5.4 4.8  NEUTROABS 4.3 3.3  HGB 10.6* 10.6*  HCT 34.8* 34.7*  MCV 94.1 94.0  PLT 204 212   Basic Metabolic Panel: Recent Labs  Lab 02/15/19 1810 02/15/19 2246  NA 136 135  K 3.7 3.7  CL 106 105  CO2 19* 20*  GLUCOSE 143* 119*  BUN 57* 60*  CREATININE 2.63* 2.74*  CALCIUM 9.1 8.8*  MG  --  1.5*  1.4*  PHOS  --  4.2   GFR: Estimated Creatinine Clearance: 19.8 mL/min (A) (by C-G formula based on SCr of 2.74 mg/dL (H)). Liver Function Tests: Recent Labs  Lab 02/15/19 1810 02/15/19 2246  AST 56* 54*  ALT 34 33  ALKPHOS 63 59  BILITOT 0.4 0.4  PROT 7.2 7.0  ALBUMIN 3.4* 3.2*   No results for input(s): LIPASE, AMYLASE in the last 168 hours. No results for input(s): AMMONIA in the last 168 hours. Coagulation Profile: No results for input(s): INR, PROTIME in the last 168 hours. Cardiac Enzymes: No results for input(s): CKTOTAL, CKMB, CKMBINDEX, TROPONINI in the last 168 hours. BNP (last 3 results) Recent Labs    10/31/18 1528  PROBNP 478   HbA1C: Recent Labs    02/15/19 2246  HGBA1C 8.9*   CBG: Recent Labs  Lab  02/16/19 0101 02/16/19 0750  GLUCAP 84 73   Lipid Profile: Recent Labs    02/15/19 1810  TRIG 124   Thyroid Function Tests: No results for input(s): TSH, T4TOTAL, FREET4, T3FREE, THYROIDAB in the last 72 hours. Anemia Panel: Recent Labs    02/15/19 1810 02/15/19 2246 02/16/19 0839  FOLATE  --   --  8.2  FERRITIN 686* 849*  --   TIBC  --   --  162*  IRON  --   --  17*  RETICCTPCT  --   --  0.6   Sepsis Labs: Recent Labs  Lab 02/15/19 1810 02/15/19 2246 02/16/19 0839  PROCALCITON 1.71 2.38 2.44  LATICACIDVEN 1.0 0.9  --     Recent Results (from the past 240 hour(s))  SARS CORONAVIRUS 2 (TAT 6-24 HRS) Nasopharyngeal Nasopharyngeal Swab     Status: Abnormal   Collection Time: 02/15/19  6:10 PM   Specimen: Nasopharyngeal Swab  Result Value Ref Range Status   SARS Coronavirus 2 POSITIVE (A) NEGATIVE Final    Comment: RESULT CALLED TO, READ BACK BY AND VERIFIED WITH: B.CERIC RN 0124 02/16/2019 MCCORMICK K (NOTE) SARS-CoV-2 target nucleic acids are DETECTED. The SARS-CoV-2 RNA is generally detectable in upper and lower respiratory specimens during the acute phase of infection. Positive results are indicative of active infection with SARS-CoV-2. Clinical  correlation with patient history and other diagnostic information is necessary to determine patient infection status. Positive results do  not rule out bacterial infection or co-infection with other viruses. The expected result is Negative. Fact Sheet for Patients: SugarRoll.be Fact Sheet for Healthcare Providers: https://www.woods-mathews.com/ This test is not yet approved or cleared by the [place] FDA and  has been authorized for detection and/or diagnosis of SARS-CoV-2 by FDA under an Emergency Use Authorization (EUA). This EUA will remain  in effect (meaning this test can be used) fo r the duration of the COVID-19 declaration under Section 564(b)(1) of the Act, 21  U.S.C. section 360bbb-3(b)(1), unless the authorization is terminated or revoked sooner. Performed at [facility], [place] [address] [place], [place] [address]   Blood Culture (routine x 2)     Status: None (Preliminary result)   Collection Time: 02/15/19  6:10 PM   Specimen: BLOOD LEFT FOREARM  Result Value Ref Range Status   Specimen Description   Final    BLOOD LEFT FOREARM Performed at [facility], [place] [address], [place], [place] [address]    Special Requests   Final    BOTTLES DRAWN AEROBIC AND ANAEROBIC Blood Culture adequate volume Performed at [facility], [place] [address] [place], [place] [address]    Culture   Final    NO GROWTH < 12 HOURS Performed at [place] [address] [place], [place] [address]    Report Status PENDING  Incomplete  Blood Culture (routine x 2)     Status: None (Preliminary result)   Collection Time: 02/15/19  6:10 PM   Specimen: BLOOD  Result Value Ref Range Status   Specimen Description   Final    BLOOD RIGHT ANTECUBITAL Performed at [facility], [place] [address], [place], [place] [address]    Special Requests   Final    BOTTLES DRAWN AEROBIC AND ANAEROBIC Blood Culture adequate volume Performed at [facility], [place] [address] [place], [place] [address]    Culture   Final    NO GROWTH < 12 HOURS Performed at [place] [address], [place], [place] [address]    Report Status PENDING  Incomplete         Radiology Studies: Dg Chest Port 1 View  Result Date: 02/15/2019 CLINICAL DATA:  Viral pneumonia EXAM: PORTABLE CHEST 1 VIEW COMPARISON:  12/22/2014 FINDINGS: The heart size is enlarged. Aortic calcifications are noted. There are scattered bilateral ground-glass airspace opacities, greatest within the left lower lobe. There is no pneumothorax. No large pleural effusion. There are end-stage degenerative  changes of the left glenohumeral joint. There is no acute osseous abnormality. IMPRESSION: Bilateral ground-glass airspace opacities which can be seen in patients with viral pneumonia. Electronically Signed   By: Constance Holster M.D.   On: 02/15/2019 19:25        Scheduled Meds: . diltiazem  300 mg Oral Daily  . heparin  5,000 Units Subcutaneous Q8H  . hydrALAZINE  50 mg Oral TID  . hydroxychloroquine  200 mg Oral Daily  . insulin aspart  0-9 Units Subcutaneous TID WC  . Ipratropium-Albuterol  1 puff Inhalation Q6H  . isosorbide mononitrate  30 mg Oral Daily  . [START ON 02/17/2019] levofloxacin  500 mg Oral Q48H  . methylPREDNISolone (SOLU-MEDROL) injection  60 mg Intravenous Q12H  . metoprolol tartrate  50 mg Oral BID  . pravastatin  40 mg Oral QHS  . vitamin C  500 mg Oral Daily  . zinc sulfate  220 mg Oral Daily   Continuous Infusions: . sodium chloride    . remdesivir 200 mg in NS 250 mL     Followed by  . [START ON 02/17/2019] remdesivir 100 mg in NS 250 mL       LOS: 1 day    Time spent: 40 minutes    Irine Seal, MD [facility]  If 7PM-7AM, please contact night-coverage [email or web address] 02/16/2019, 10:34 AM

## 2019-02-16 NOTE — Progress Notes (Signed)
Pharmacy: Remdesivir   Patient is a 78 y.o. female with COVID.  Pharmacy has been consulted for remdesivir dosing.   - ALT: 33 - CXR shows: LLL ground-glass appearance - Pt is requiring supplemental oxygen: (2L) - Ddimer: admit - 3.67, with Cl ~ 20 ml/min, Heparin SQ for VTE prophylaxis. DAily Ddimer ordered    A/P:  - Patient meets criteria for remdesivir. Will initiate remdesivir 200 mg once followed by 100 mg daily x 4 days.  - Daily CMET while on remdesivir - ordered - Will f/u pt's ALT and clinical condition  [place], [place] PharmD 02/16/2019, 9:12 AM

## 2019-02-16 NOTE — Progress Notes (Signed)
CRITICAL VALUE ALERT  Critical Value:  COVID (+)  Date & Time Notied:  02/16/19 0140  Provider Notified: Silas [place], NP by primary RN   Orders Received/Actions taken: pending

## 2019-02-17 DIAGNOSIS — E872 Acidosis: Secondary | ICD-10-CM

## 2019-02-17 DIAGNOSIS — J069 Acute upper respiratory infection, unspecified: Secondary | ICD-10-CM

## 2019-02-17 DIAGNOSIS — U071 COVID-19: Secondary | ICD-10-CM

## 2019-02-17 LAB — COMPREHENSIVE METABOLIC PANEL
ALT: 26 U/L (ref 0–44)
AST: 38 U/L (ref 15–41)
Albumin: 2.7 g/dL — ABNORMAL LOW (ref 3.5–5.0)
Alkaline Phosphatase: 56 U/L (ref 38–126)
Anion gap: 10 (ref 5–15)
BUN: 68 mg/dL — ABNORMAL HIGH (ref 8–23)
CO2: 17 mmol/L — ABNORMAL LOW (ref 22–32)
Calcium: 8.2 mg/dL — ABNORMAL LOW (ref 8.9–10.3)
Chloride: 107 mmol/L (ref 98–111)
Creatinine, Ser: 2.32 mg/dL — ABNORMAL HIGH (ref 0.44–1.00)
GFR calc Af Amer: 23 mL/min — ABNORMAL LOW (ref >60)
GFR calc non Af Amer: 20 mL/min — ABNORMAL LOW (ref >60)
Glucose, Bld: 254 mg/dL — ABNORMAL HIGH (ref 70–99)
Potassium: 4.5 mmol/L (ref 3.5–5.1)
Sodium: 134 mmol/L — ABNORMAL LOW (ref 135–145)
Total Bilirubin: 0.1 mg/dL — ABNORMAL LOW (ref 0.3–1.2)
Total Protein: 6.2 g/dL — ABNORMAL LOW (ref 6.5–8.1)

## 2019-02-17 LAB — CBC WITH DIFFERENTIAL/PLATELET
Abs Immature Granulocytes: 0.01 10*3/uL (ref 0.00–0.07)
Basophils Absolute: 0 10*3/uL (ref 0.0–0.1)
Basophils Relative: 0 %
Eosinophils Absolute: 0 10*3/uL (ref 0.0–0.5)
Eosinophils Relative: 0 %
HCT: 32.4 % — ABNORMAL LOW (ref 36.0–46.0)
Hemoglobin: 10 g/dL — ABNORMAL LOW (ref 12.0–15.0)
Immature Granulocytes: 0 %
Lymphocytes Relative: 14 %
Lymphs Abs: 0.4 10*3/uL — ABNORMAL LOW (ref 0.7–4.0)
MCH: 28.7 pg (ref 26.0–34.0)
MCHC: 30.9 g/dL (ref 30.0–36.0)
MCV: 92.8 fL (ref 80.0–100.0)
Monocytes Absolute: 0.1 10*3/uL (ref 0.1–1.0)
Monocytes Relative: 2 %
Neutro Abs: 2.2 10*3/uL (ref 1.7–7.7)
Neutrophils Relative %: 84 %
Platelets: 165 10*3/uL (ref 150–400)
RBC: 3.49 MIL/uL — ABNORMAL LOW (ref 3.87–5.11)
RDW: 13.7 % (ref 11.5–15.5)
WBC: 2.7 10*3/uL — ABNORMAL LOW (ref 4.0–10.5)
nRBC: 0 % (ref 0.0–0.2)

## 2019-02-17 LAB — GLUCOSE, CAPILLARY
Glucose-Capillary: 352 mg/dL — ABNORMAL HIGH (ref 70–99)
Glucose-Capillary: 350 mg/dL — ABNORMAL HIGH (ref 70–99)
Glucose-Capillary: 432 mg/dL — ABNORMAL HIGH (ref 70–99)

## 2019-02-17 LAB — FERRITIN: Ferritin: 780 ng/mL — ABNORMAL HIGH (ref 11–307)

## 2019-02-17 LAB — PROCALCITONIN: Procalcitonin: 1.87 ng/mL

## 2019-02-17 LAB — D-DIMER, QUANTITATIVE: D-Dimer, Quant: 2.03 ug/mL-FEU — ABNORMAL HIGH (ref 0.00–0.50)

## 2019-02-17 LAB — C-REACTIVE PROTEIN: CRP: 7.8 mg/dL — ABNORMAL HIGH (ref <1.0)

## 2019-02-17 LAB — MAGNESIUM: Magnesium: 2 mg/dL (ref 1.7–2.4)

## 2019-02-17 LAB — PHOSPHORUS: Phosphorus: 4.7 mg/dL — ABNORMAL HIGH (ref 2.5–4.6)

## 2019-02-17 MED ORDER — INSULIN ASPART 100 UNIT/ML IV SOLN: 10.0000 [IU] | Freq: Once | INTRAVENOUS | Status: DC

## 2019-02-17 MED ORDER — SODIUM BICARBONATE 650 MG PO TABS
650.0000 mg | ORAL_TABLET | Freq: Two times a day (BID) | ORAL | Status: DC
  Administered 2019-02-17 – 2019-02-21 (×9): 650 mg via ORAL
  Filled 2019-02-17 (×11): qty 1

## 2019-02-17 MED ORDER — INSULIN GLARGINE 100 UNIT/ML ~~LOC~~ SOLN
5.0000 [IU] | Freq: Every day | SUBCUTANEOUS | Status: DC
  Administered 2019-02-17: 5 [IU] via SUBCUTANEOUS
  Filled 2019-02-17 (×2): qty 0.05

## 2019-02-17 MED ORDER — METHYLPREDNISOLONE SODIUM SUCC 40 MG IJ SOLR
40.0000 mg | Freq: Two times a day (BID) | INTRAMUSCULAR | Status: DC
  Administered 2019-02-17 – 2019-02-19 (×4): 40 mg via INTRAVENOUS
  Filled 2019-02-17 (×4): qty 1

## 2019-02-17 MED ORDER — INSULIN ASPART 100 UNIT/ML ~~LOC~~ SOLN
10.0000 [IU] | Freq: Once | SUBCUTANEOUS | Status: AC
  Administered 2019-02-17: 23:00:00 10 [IU] via SUBCUTANEOUS

## 2019-02-17 MED ORDER — HEPARIN SODIUM (PORCINE) 10000 UNIT/ML IJ SOLN
7500.0000 [IU] | Freq: Three times a day (TID) | INTRAMUSCULAR | Status: DC
  Administered 2019-02-17 – 2019-02-21 (×12): 7500 [IU] via SUBCUTANEOUS
  Filled 2019-02-17 (×10): qty 1

## 2019-02-17 NOTE — Progress Notes (Signed)
Paged with CBG 432.  Will order 10u novolog now and order repeat CBG in 1hr.

## 2019-02-17 NOTE — Progress Notes (Signed)
RN attempted to return phone call to Vickii Chafe, sister. No answer on cell phone or home phone.

## 2019-02-17 NOTE — Progress Notes (Signed)
Ok to increase the SQ heparin to 7500 units SQ q8 due to BMI>35 per Dr Grandville Silos.  Onnie Boer, PharmD, BCIDP, AAHIVP, CPP Infectious Disease Pharmacist 02/17/2019 1:09 PM

## 2019-02-17 NOTE — Progress Notes (Signed)
PROGRESS NOTE    [name] [name]  CWU:[id number] DOB: 06-18-1940 DOA: 02/15/2019 PCP: Nolene Ebbs, MD    Brief Narrative:  HPI per Dr Juanetta Gosling is a 78 y.o. 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  History limited from patient due to somnolence, otherwise supplemented from EDP, chart review, and caretaker by phone.  Caretaker states that she (the caretaker) was tested for COVID-19 on 02/07/2019 and found out she was positive on 02/08/2019.  She was in contact with patient prior to positive result but has been quarantining since then and speaking to her by phone/video call since then.  She states that patient had one episode of vomiting about a week ago and reported 1 episode of bowel incontinence in the interim but otherwise had no complaints.  Today home health nurse came to visit the patient and noted that she had a fever.  EMS were called and per triage notes temperature is 101.7 F with SPO2 85% on room air which improved to 98% on 2 L supplemental O2 via [place].  She is brought to the ED for further evaluation.  Per caretaker, patient normally ambulates on her own and is usually alert and oriented and communicates well.  She has no known history of dementia per caretaker.  At time of admission, patient is somnolent but will intermittently awaken to voice and light touch.  She is oriented to self, place, and knows the current president.  She states the year is 2006.  She otherwise nods her head yes to all other questions.  ED Course:  Initial vitals showed BP 145/66, pulse 109, RR 27, temp 101.4 Fahrenheit rectally, SPO2 85% on room air per triage notes improved to 100% on 2 L supplemental O2 via [place].  Labs are  notable for BUN 57, creatinine 2.63, EGFR 20, sodium 136, potassium 3.7, bicarb 19, serum glucose 143, WBC 5.4, hemoglobin 10.6, platelets 204,000, lactic acid 1.0, LDH 265, ferritin 686, fibrinogen 541, CRP 6.8, high-sensitivity troponin I 64, procalcitonin 1.71.  Blood cultures were obtained and pending.  SARS-CoV-2 test was obtained and pending.  Portable chest x-ray showed scattered bilateral groundglass airspace opacities.  Patient was given Advil and started on empiric IV antibiotics with Levaquin (penicillin allergy).  The hospitalist service was consulted to admit for further evaluation and management.  Assessment & Plan:   Principal Problem:   Acute respiratory disease due to COVID-19 virus Active Problems:   Chronic diastolic CHF (congestive heart failure) (HCC)   COPD (chronic obstructive pulmonary disease) (HCC)   Diabetes mellitus ([place])   Hypertension associated with diabetes ([place])   Hyperlipidemia associated with type 2 diabetes mellitus ([place])   Systemic lupus erythematosus (HCC)   Acute on chronic respiratory failure with hypoxia (HCC)   Acute-on-chronic kidney injury ([place])   Sepsis ([place])  #1 sepsis with acute on chronic respiratory distress secondary to COVID-19 virus infection Patient had presented with fevers, lethargy, hypoxia with sats of 85% on room air that improved to 98% on 2 L.  Work-up in the ED with chest x-ray did show scattered bilateral groundglass airspace opacities.  Procalcitonin level obtained was 1.71.  Ferritin was 849.  Fibrinogen of 541.  WBC of 5.4.  CRP elevated  at 7.0.  Procalcitonin was 2.38.  COVID-19 was positive.  Ferritin level currently at 780 with a CRP of 7.8 and a procalcitonin of 1.87.  Continue IV Solu-Medrol and IV remdesivir, vitamin C and zinc.  IV fluids.  Continue Levaquin.  Supportive care.  2.  Acute renal failure on chronic kidney disease stage III/metabolic acidosis. Likely secondary to prerenal azotemia in the setting of  diuretics, ACE inhibitor.  Urinalysis ordered however not done.  Urine electrolytes pending.  Continue to hold diuretics and ACE inhibitor.  Patient on gentle hydration with improvement with renal function.  Creatinine trending down currently at 2.32 from 2.74.  Continue gentle hydration and monitor for volume overload.  Continue bicarb tablets.  Avoid nephrotoxins.  3.  COPD Continue Combivent.  Albuterol as needed.  Continue O2.  Stable.   4.  Chronic diastolic CHF Patient looks more on the dry side.  EF of 55 to 60% with grade 1 diastolic dysfunction per 2D echo 10/29/2015.  Patient volume depleted on examination with dry mucous membranes.  Continue to hold diuretics.  Continue Lopressor, Pravachol, Imdur, hydralazine, diltiazem, gentle hydration and monitor closely for volume overload.  Strict I's and O's.  Daily weights.  Follow.  5.  SLE Continue Plaquenil.  6.  Insulin-dependent diabetes mellitus  Hemoglobin A1c 8.9.  CBG of 352 this afternoon.  Patient on steroids.  Will place on Lantus 5 units daily in addition to sliding scale insulin while on steroids.   7.  Hypertension Blood pressure stable.  Continue home regimen diltiazem, hydralazine, Imdur, Lopressor.  Continue to hold diuretic and ACE inhibitor secondary to acute on chronic kidney disease.  8.  Hyperlipidemia Continue statin.  9.  Anemia of chronic disease Hemoglobin stable.  Follow.    DVT prophylaxis: Heparin Code Status: DNR Family Communication: Updated patient.  No family at bedside. Disposition Plan: Transfer to [facility]   Consultants:   None  Procedures:   Chest x-ray 02/15/2019    Antimicrobials:   Levaquin 02/15/2019   Subjective: Patient more alert today.  Less drowsy.  Less lethargic.  Sitting up in bed trying to make a phone call.  Denies any significant shortness of breath.  Denies any chest pain.  Awaiting to be transferred to [facility].  Objective: Vitals:   02/16/19 1238 02/16/19  1405 02/16/19 2118 02/17/19 0502  BP: 136/67 130/66 130/69 (!) 145/70  Pulse: (!) 108 100 80 77  Resp: '18 18 18 18  '$ Temp: 98.2 F (36.8 C) 97.6 F (36.4 C) 98.5 F (36.9 C) 98.4 F (36.9 C)  TempSrc:  Oral    SpO2: 99% 100% 98% 99%  Weight:    95.3 kg  Height:        Intake/Output Summary (Last 24 hours) at 02/17/2019 1101 Last data filed at 02/17/2019 0904 Gross per 24 hour  Intake 2040.33 ml  Output 1250 ml  Net 790.33 ml   Filed Weights   02/15/19 2204 02/17/19 0502  Weight: 100.7 kg 95.3 kg    Examination:  General exam: NAD.  Dry mucous membranes. Respiratory system: Clear to auscultation bilaterally anterior lung fields.  No wheezing.  Cardiovascular system: Regular rate rhythm no murmurs rubs or gallops.  No JVD.  No lower extremity edema.  Gastrointestinal system: Abdomen is soft, obese, nontender, nondistended, positive bowel sounds.  No rebound.  No guarding. Central nervous system: Alert and oriented. No focal neurological deficits. Extremities: Symmetric 5 x 5 power. Skin: No rashes, lesions or ulcers Psychiatry: Judgement  and insight appear normal. Mood & affect appropriate.     Data Reviewed: I have personally reviewed following labs and imaging studies  CBC: Recent Labs  Lab 02/15/19 1810 02/15/19 2246 02/16/19 1418 02/17/19 0502  WBC 5.4 4.8 4.1 2.7*  NEUTROABS 4.3 3.3  --  2.2  HGB 10.6* 10.6* 10.0* 10.0*  HCT 34.8* 34.7* 33.7* 32.4*  MCV 94.1 94.0 98.5 92.8  PLT 204 178 141* 098   Basic Metabolic Panel: Recent Labs  Lab 02/15/19 1810 02/15/19 2246 02/16/19 1418 02/17/19 0502  NA 136 135 134* 134*  K 3.7 3.7 4.0 4.5  CL 106 105 107 107  CO2 19* 20* 18* 17*  GLUCOSE 143* 119* 135* 254*  BUN 57* 60* 59* 68*  CREATININE 2.63* 2.74* 2.52* 2.32*  CALCIUM 9.1 8.8* 8.6* 8.2*  MG  --  1.5*  1.4*  --  2.0  PHOS  --  4.2  --  4.7*   GFR: Estimated Creatinine Clearance: 22.7 mL/min (A) (by C-G formula based on SCr of 2.32 mg/dL (H)).  Liver Function Tests: Recent Labs  Lab 02/15/19 1810 02/15/19 2246 02/16/19 1418 02/17/19 0502  AST 56* 54* 55* 38  ALT 34 33 31 26  ALKPHOS 63 59 58 56  BILITOT 0.4 0.4 0.5 0.1*  PROT 7.2 7.0 6.7 6.2*  ALBUMIN 3.4* 3.2* 3.0* 2.7*   No results for input(s): LIPASE, AMYLASE in the last 168 hours. No results for input(s): AMMONIA in the last 168 hours. Coagulation Profile: No results for input(s): INR, PROTIME in the last 168 hours. Cardiac Enzymes: No results for input(s): CKTOTAL, CKMB, CKMBINDEX, TROPONINI in the last 168 hours. BNP (last 3 results) Recent Labs    10/31/18 1528  PROBNP 478   HbA1C: Recent Labs    02/15/19 2246  HGBA1C 8.9*   CBG: Recent Labs  Lab 02/16/19 0101 02/16/19 0750 02/16/19 1217 02/16/19 1617 02/16/19 2114  GLUCAP 84 73 122* 158* 220*   Lipid Profile: Recent Labs    02/15/19 1810  TRIG 124   Thyroid Function Tests: No results for input(s): TSH, T4TOTAL, FREET4, T3FREE, THYROIDAB in the last 72 hours. Anemia Panel: Recent Labs    02/16/19 0839 02/16/19 1418 02/17/19 0502  VITAMINB12 4,316*  --   --   FOLATE 8.2  --   --   FERRITIN  --  681* 780*  TIBC 162*  --   --   IRON 17*  --   --   RETICCTPCT 0.6  --   --    Sepsis Labs: Recent Labs  Lab 02/15/19 1810 02/15/19 2246 02/16/19 0839 02/17/19 0502  PROCALCITON 1.71 2.38 2.44 1.87  LATICACIDVEN 1.0 0.9  --   --     Recent Results (from the past 240 hour(s))  SARS CORONAVIRUS 2 (TAT 6-24 HRS) Nasopharyngeal Nasopharyngeal Swab     Status: Abnormal   Collection Time: 02/15/19  6:10 PM   Specimen: Nasopharyngeal Swab  Result Value Ref Range Status   SARS Coronavirus 2 POSITIVE (A) NEGATIVE Final    Comment: RESULT CALLED TO, READ BACK BY AND VERIFIED WITH: B.CERIC RN 0124 02/16/2019 MCCORMICK K (NOTE) SARS-CoV-2 target nucleic acids are DETECTED. The SARS-CoV-2 RNA is generally detectable in upper and lower respiratory specimens during the acute phase of  infection. Positive results are indicative of active infection with SARS-CoV-2. Clinical  correlation with patient history and other diagnostic information is necessary to determine patient infection status. Positive results do  not rule out bacterial infection or  co-infection with other viruses. The expected result is Negative. Fact Sheet for Patients: SugarRoll.be Fact Sheet for Healthcare Providers: https://www.woods-mathews.com/ This test is not yet approved or cleared by the [place] FDA and  has been authorized for detection and/or diagnosis of SARS-CoV-2 by FDA under an Emergency Use Authorization (EUA). This EUA will remain  in effect (meaning this test can be used) fo r the duration of the COVID-19 declaration under Section 564(b)(1) of the Act, 21 U.S.C. section 360bbb-3(b)(1), unless the authorization is terminated or revoked sooner. Performed at [facility], [place] [address] [place], [place] [address]   Blood Culture (routine x 2)     Status: None (Preliminary result)   Collection Time: 02/15/19  6:10 PM   Specimen: BLOOD LEFT FOREARM  Result Value Ref Range Status   Specimen Description   Final    BLOOD LEFT FOREARM Performed at [facility], [place] [address], [place], [place] [address]    Special Requests   Final    BOTTLES DRAWN AEROBIC AND ANAEROBIC Blood Culture adequate volume Performed at [facility], [place] [address], [place], [place] [address]    Culture   Final    NO GROWTH 2 DAYS Performed at [facility] [address] [place], [place] [address]    Report Status PENDING  Incomplete  Blood Culture (routine x 2)     Status: None (Preliminary result)   Collection Time: 02/15/19  6:10 PM   Specimen: BLOOD  Result Value Ref Range Status   Specimen Description   Final    BLOOD RIGHT ANTECUBITAL Performed at [facility], [place] [address] [place], [place] [address]    Special Requests   Final    BOTTLES DRAWN AEROBIC AND ANAEROBIC Blood Culture adequate volume Performed at [facility], [place] [address] [place], [place] [address]    Culture   Final    NO GROWTH 2 DAYS Performed at [place] [address], [place], [place] [address]    Report Status PENDING  Incomplete         Radiology Studies: Dg Chest Port 1 View  Result Date: 02/15/2019 CLINICAL DATA:  Viral pneumonia EXAM: PORTABLE CHEST 1 VIEW COMPARISON:  12/22/2014 FINDINGS: The heart size is enlarged. Aortic calcifications are noted. There are scattered bilateral ground-glass airspace opacities, greatest within the left lower lobe. There is no pneumothorax. No large pleural effusion. There are end-stage degenerative changes of the left glenohumeral joint. There is no acute osseous abnormality. IMPRESSION: Bilateral ground-glass airspace opacities which can be seen in patients with viral pneumonia. Electronically Signed   By: Constance Holster M.D.   On: 02/15/2019 19:25        Scheduled Meds: . allopurinol  100 mg Oral Daily  . aspirin  325 mg Oral Daily  . brimonidine  1 drop Both Eyes BID  . calcium carbonate  1,250 mg Oral QAC breakfast  . cholecalciferol  1,000 Units Oral Daily  . citalopram  10 mg Oral Daily  . clobetasol ointment  1 application Topical BID  . colchicine  0.6 mg Oral BID  . diltiazem  300 mg Oral Daily  . gabapentin  300 mg Oral BID  . heparin  5,000 Units Subcutaneous Q8H  . hydrALAZINE  50 mg Oral TID  . hydroxychloroquine  200 mg Oral Daily  . insulin aspart  0-9 Units Subcutaneous TID WC  . insulin  glargine  5 Units Subcutaneous Daily  . Ipratropium-Albuterol  1 puff Inhalation Q6H  . isosorbide mononitrate  30 mg Oral Daily  . levofloxacin  500 mg Oral Q48H  . loratadine  10 mg Oral Daily  . methylPREDNISolone (SOLU-MEDROL) injection  60 mg Intravenous BID  . metoprolol  tartrate  50 mg Oral BID  . mometasone-formoterol  2 puff Inhalation BID  . oxybutynin  10 mg Oral Daily  . pantoprazole  40 mg Oral Daily  . pravastatin  40 mg Oral QHS  . sodium bicarbonate  650 mg Oral BID  . vitamin C  500 mg Oral Daily  . zinc sulfate  220 mg Oral Daily   Continuous Infusions: . sodium chloride 75 mL/hr at 02/17/19 0536  . remdesivir 100 mg in NS 250 mL       LOS: 2 days    Time spent: 40 minutes    Irine Seal, MD [facility]  If 7PM-7AM, please contact night-coverage [email or web address] 02/17/2019, 11:01 AM

## 2019-02-17 NOTE — Progress Notes (Signed)
[facility] transporting pt to [facility]- 9121. Packet given to [facility] and updated on status of pt. COVID precautions taken prior to transport. Report attempted to [facility], no answer.

## 2019-02-18 LAB — GLUCOSE, CAPILLARY
Glucose-Capillary: 433 mg/dL — ABNORMAL HIGH (ref 70–99)
Glucose-Capillary: 375 mg/dL — ABNORMAL HIGH (ref 70–99)
Glucose-Capillary: 197 mg/dL — ABNORMAL HIGH (ref 70–99)
Glucose-Capillary: 173 mg/dL — ABNORMAL HIGH (ref 70–99)

## 2019-02-18 LAB — COMPREHENSIVE METABOLIC PANEL
ALT: 29 U/L (ref 0–44)
AST: 43 U/L — ABNORMAL HIGH (ref 15–41)
Albumin: 2.7 g/dL — ABNORMAL LOW (ref 3.5–5.0)
Alkaline Phosphatase: 60 U/L (ref 38–126)
Anion gap: 12 (ref 5–15)
BUN: 84 mg/dL — ABNORMAL HIGH (ref 8–23)
CO2: 16 mmol/L — ABNORMAL LOW (ref 22–32)
Calcium: 8.3 mg/dL — ABNORMAL LOW (ref 8.9–10.3)
Chloride: 105 mmol/L (ref 98–111)
Creatinine, Ser: 2.29 mg/dL — ABNORMAL HIGH (ref 0.44–1.00)
GFR calc Af Amer: 23 mL/min — ABNORMAL LOW (ref >60)
GFR calc non Af Amer: 20 mL/min — ABNORMAL LOW (ref >60)
Glucose, Bld: 467 mg/dL — ABNORMAL HIGH (ref 70–99)
Potassium: 4.5 mmol/L (ref 3.5–5.1)
Sodium: 133 mmol/L — ABNORMAL LOW (ref 135–145)
Total Bilirubin: 0.6 mg/dL (ref 0.3–1.2)
Total Protein: 6.2 g/dL — ABNORMAL LOW (ref 6.5–8.1)

## 2019-02-18 LAB — BRAIN NATRIURETIC PEPTIDE: B Natriuretic Peptide: 303.7 pg/mL — ABNORMAL HIGH (ref 0.0–100.0)

## 2019-02-18 LAB — CBC WITH DIFFERENTIAL/PLATELET
Abs Immature Granulocytes: 0.02 10*3/uL (ref 0.00–0.07)
Basophils Absolute: 0 10*3/uL (ref 0.0–0.1)
Basophils Relative: 0 %
Eosinophils Absolute: 0 10*3/uL (ref 0.0–0.5)
Eosinophils Relative: 0 %
HCT: 32.1 % — ABNORMAL LOW (ref 36.0–46.0)
Hemoglobin: 10.2 g/dL — ABNORMAL LOW (ref 12.0–15.0)
Immature Granulocytes: 0 %
Lymphocytes Relative: 7 %
Lymphs Abs: 0.4 10*3/uL — ABNORMAL LOW (ref 0.7–4.0)
MCH: 29 pg (ref 26.0–34.0)
MCHC: 31.8 g/dL (ref 30.0–36.0)
MCV: 91.2 fL (ref 80.0–100.0)
Monocytes Absolute: 0.2 10*3/uL (ref 0.1–1.0)
Monocytes Relative: 3 %
Neutro Abs: 4.7 10*3/uL (ref 1.7–7.7)
Neutrophils Relative %: 90 %
Platelets: 201 10*3/uL (ref 150–400)
RBC: 3.52 MIL/uL — ABNORMAL LOW (ref 3.87–5.11)
RDW: 13.8 % (ref 11.5–15.5)
WBC: 5.3 10*3/uL (ref 4.0–10.5)
nRBC: 0 % (ref 0.0–0.2)

## 2019-02-18 LAB — TYPE AND SCREEN
ABO/RH(D): B POS
Antibody Screen: NEGATIVE

## 2019-02-18 LAB — MAGNESIUM: Magnesium: 2.1 mg/dL (ref 1.7–2.4)

## 2019-02-18 LAB — C-REACTIVE PROTEIN: CRP: 5 mg/dL — ABNORMAL HIGH (ref <1.0)

## 2019-02-18 LAB — ABO/RH: ABO/RH(D): B POS

## 2019-02-18 LAB — PROCALCITONIN: Procalcitonin: 1.31 ng/mL

## 2019-02-18 LAB — D-DIMER, QUANTITATIVE: D-Dimer, Quant: 2.14 ug/mL-FEU — ABNORMAL HIGH (ref 0.00–0.50)

## 2019-02-18 MED ORDER — INSULIN ASPART 100 UNIT/ML ~~LOC~~ SOLN
30.0000 [IU] | Freq: Once | SUBCUTANEOUS | Status: AC
  Administered 2019-02-18: 09:00:00 30 [IU] via SUBCUTANEOUS

## 2019-02-18 MED ORDER — INSULIN ASPART 100 UNIT/ML ~~LOC~~ SOLN
5.0000 [IU] | Freq: Three times a day (TID) | SUBCUTANEOUS | Status: DC
  Administered 2019-02-18 – 2019-02-21 (×10): 5 [IU] via SUBCUTANEOUS

## 2019-02-18 MED ORDER — INSULIN GLARGINE 100 UNIT/ML ~~LOC~~ SOLN: 25.0000 [IU] | Freq: Every day | SUBCUTANEOUS | Status: DC

## 2019-02-18 MED ORDER — FUROSEMIDE 20 MG PO TABS: 40.0000 mg | ORAL_TABLET | Freq: Two times a day (BID) | ORAL | Status: DC

## 2019-02-18 MED ORDER — INSULIN ASPART 100 UNIT/ML ~~LOC~~ SOLN
0.0000 [IU] | Freq: Three times a day (TID) | SUBCUTANEOUS | Status: DC
  Administered 2019-02-18: 12:00:00 15 [IU] via SUBCUTANEOUS
  Administered 2019-02-18: 3 [IU] via SUBCUTANEOUS
  Administered 2019-02-19: 5 [IU] via SUBCUTANEOUS
  Administered 2019-02-19 (×2): 8 [IU] via SUBCUTANEOUS
  Administered 2019-02-20: 09:00:00 5 [IU] via SUBCUTANEOUS
  Administered 2019-02-20: 3 [IU] via SUBCUTANEOUS
  Administered 2019-02-20: 13:00:00 5 [IU] via SUBCUTANEOUS
  Administered 2019-02-21: 10:00:00 2 [IU] via SUBCUTANEOUS
  Administered 2019-02-21: 3 [IU] via SUBCUTANEOUS

## 2019-02-18 MED ORDER — INSULIN ASPART 100 UNIT/ML ~~LOC~~ SOLN
0.0000 [IU] | Freq: Every day | SUBCUTANEOUS | Status: DC
  Administered 2019-02-19: 3 [IU] via SUBCUTANEOUS

## 2019-02-18 MED ORDER — DILTIAZEM HCL ER COATED BEADS 180 MG PO CP24
300.0000 mg | ORAL_CAPSULE | Freq: Every day | ORAL | Status: DC
  Administered 2019-02-19 – 2019-02-21 (×3): 300 mg via ORAL
  Filled 2019-02-18 (×5): qty 1

## 2019-02-18 MED ORDER — INSULIN GLARGINE 100 UNIT/ML ~~LOC~~ SOLN
35.0000 [IU] | Freq: Two times a day (BID) | SUBCUTANEOUS | Status: DC
  Administered 2019-02-18 – 2019-02-21 (×7): 35 [IU] via SUBCUTANEOUS
  Filled 2019-02-18 (×8): qty 0.35

## 2019-02-18 MED ORDER — FUROSEMIDE 10 MG/ML IJ SOLN
40.0000 mg | Freq: Once | INTRAMUSCULAR | Status: AC
  Administered 2019-02-18: 40 mg via INTRAVENOUS
  Filled 2019-02-18: qty 4

## 2019-02-18 NOTE — Evaluation (Signed)
Occupational Therapy Evaluation Patient Details Name: [name] [name] MRN: [id number] DOB: 10/15/1940 Today's Date: 02/18/2019    History of Present Illness [name] [name] is a 78 y.o. female with medical history significant for chronic diastolic CHF (EF 56-43%, P2RJ, mod pHTN by TTE 10/29/2015), COPD on oxygen as needed, CKD stage III, SLE , IDDM, HTN, HLD, Hx of PE , RBBB, and MGUS who presents to the ED 02/15/19 for evaluation of generalized weakness and fever at home, diagnosed with Covid 19 PNA.   Clinical Impression   This 78 y/o female presents with the above. PTA pt reports use of rollator for functional mobility, received daily assist from a caregiver for iADL and intermittent ADL tasks. Pt with flat affect throughout session, delayed processing but overall following simple commands. Pt currently requires maxA(+2) for functional transfers using RW, maxA for LB ADL and minA for seated UB ADL. Vitals monitored throughout including BP (see below); lowest SpO2 noted 84% on RA, applied 2L O2 with SpO2 96% end of session, HR 62 and RR 22.   BP after transfer from [facility] to recliner: 106/49 (67) Approx 2-3 min later: 99/49 (65) End of session while seated in recliner: 103/54 (65)  Pt denies dizziness throughout session/activity.   Pt will benefit from continued acute OT services and recommend follow up therapy services in SNF setting after discharge to maximize her safety and independence with ADL and mobility. Will follow.    Follow Up Recommendations  SNF;Supervision/Assistance - 24 hour    Equipment Recommendations  (defer to next venue)           Precautions / Restrictions Precautions Precautions: Fall Precaution Comments: BP low with sitting/standing Restrictions Weight Bearing Restrictions: No      Mobility Bed Mobility Overal bed mobility: Needs Assistance Bed Mobility: Rolling;Sidelying to Sit Rolling: Mod assist Sidelying to sit: Max assist;+2 for physical assistance;+2 for  safety/equipment       General bed mobility comments: multimodal cues   , extra time to initiate , required assist with legs and trunk, and scooting to bed edge  Transfers Overall transfer level: Needs assistance Equipment used: Rolling walker (2 wheeled) Transfers: Sit to/from [facility] Sit to Stand: Max assist;+2 physical assistance;+2 safety/equipment;From elevated surface Stand pivot transfers: +2 safety/equipment;+2 physical assistance;Max assist       General transfer comment: 2 assist to rise from bed and  BSC, cues for hand placem,ent. small shuffle steps from bed to [facility] then to recliner, Decreased step length of the left leg, more of a dragging the leg.    Balance Overall balance assessment: Needs assistance Sitting-balance support: Bilateral upper extremity supported;Feet supported Sitting balance-Leahy Scale: Poor Sitting balance - Comments: steady assist to sit upright. Postural control: Posterior lean Standing balance support: During functional activity;Bilateral upper extremity supported Standing balance-Leahy Scale: Poor Standing balance comment: reliant on RW, at times,  not pushing down into RW.                           ADL either performed or assessed with clinical judgement   ADL Overall ADL's : Needs assistance/impaired Eating/Feeding: Set up;Sitting   Grooming: Set up;Min guard;Sitting   Upper Body Bathing: Minimal assistance;Sitting   Lower Body Bathing: +2 for physical assistance;+2 for safety/equipment;Sit to/from stand;Maximal assistance   Upper Body Dressing : Minimal assistance;Sitting   Lower Body Dressing: +2 for physical assistance;+2 for safety/equipment;Sit to/from stand;Maximal assistance   Toilet Transfer: +2 for physical  assistance;+2 for safety/equipment;Stand-pivot;BSC;RW;Moderate assistance;Maximal assistance   Toileting- Clothing Manipulation and Hygiene: Maximal assistance;Total assistance;+2 for  physical assistance;+2 for safety/equipment;Sit to/from stand Toileting - Clothing Manipulation Details (indicate cue type and reason): assist for pericare at bed level initially as purewick had leaked      Functional mobility during ADLs: Maximal assistance;+2 for physical assistance;+2 for safety/equipment;Rolling walker(stand pivot transfer)                           Pertinent Vitals/Pain Pain Assessment: No/denies pain     Hand Dominance     Extremity/Trunk Assessment Upper Extremity Assessment Upper Extremity Assessment: Generalized weakness   Lower Extremity Assessment Lower Extremity Assessment: Defer to PT evaluation   Cervical / Trunk Assessment Cervical / Trunk Assessment: Normal   Communication Communication Communication: No difficulties   Cognition Arousal/Alertness: Awake/alert Behavior During Therapy: Flat affect Overall Cognitive Status: No family/caregiver present to determine baseline cognitive functioning Area of Impairment: Orientation                               General Comments: patient stated end of October,  at the [facility] and knew she has covid; overall following commands but with mild delay, flat affect   General Comments       Exercises     Shoulder Instructions      Home Living Family/patient expects to be discharged to:: Private residence Living Arrangements: Alone;Other (Comment)(has a caregiver) Available Help at Discharge: Personal care attendant Type of Home: Apartment Home Access: Level entry     Home Layout: One level     Bathroom Shower/Tub: Teacher, early years/pre: Standard     Home Equipment: Environmental consultant - 4 wheels;Walker - 2 wheels;Shower seat          Prior Functioning/Environment Level of Independence: Needs assistance  Gait / Transfers Assistance Needed: used RW ADL's / Homemaking Assistance Needed: pt reports aid helps with cleaning, cooking, helps with bathing and  intermittently with dressing            OT Problem List: Decreased strength;Decreased range of motion;Decreased activity tolerance;Decreased safety awareness;Decreased knowledge of use of DME or AE;Obesity;Cardiopulmonary status limiting activity;Decreased knowledge of precautions;Decreased cognition;Impaired balance (sitting and/or standing)      OT Treatment/Interventions: Self-care/ADL training;Therapeutic exercise;Energy conservation;DME and/or AE instruction;Therapeutic activities;Patient/family education;Balance training;Cognitive remediation/compensation    OT Goals(Current goals can be found in the care plan section) Acute Rehab OT Goals Patient Stated Goal: agreed to OOB OT Goal Formulation: With patient Time For Goal Achievement: 03/04/19 Potential to Achieve Goals: Good  OT Frequency: Min 2X/week   Barriers to D/C:            Co-evaluation PT/OT/SLP Co-Evaluation/Treatment: Yes Reason for Co-Treatment: For patient/therapist safety;To address functional/ADL transfers PT goals addressed during session: Mobility/safety with mobility OT goals addressed during session: ADL's and self-care      AM-PAC OT "6 Clicks" Daily Activity     Outcome Measure Help from another person eating meals?: A Little Help from another person taking care of personal grooming?: A Little Help from another person toileting, which includes using toliet, bedpan, or urinal?: A Lot Help from another person bathing (including washing, rinsing, drying)?: A Lot Help from another person to put on and taking off regular upper body clothing?: A Little Help from another person to put on and taking off regular lower body clothing?: A Lot 6  Click Score: 15   End of Session Equipment Utilized During Treatment: Gait belt;Rolling walker;Oxygen Nurse Communication: Mobility status  Activity Tolerance: Patient tolerated treatment well Patient left: in chair;with call bell/phone within reach;with chair alarm  set  OT Visit Diagnosis: Muscle weakness (generalized) (M62.81);Unsteadiness on feet (R26.81)                Time: 1010-1047 OT Time Calculation (min): 37 min Charges:  OT General Charges $OT Visit: 1 Visit OT Evaluation $OT Eval Moderate Complexity: 1 Mod  Lou Cal, OT [employer] Pager [phone] Office [phone]   Raymondo Band 02/18/2019, 3:27 PM

## 2019-02-18 NOTE — Progress Notes (Signed)
Tim [phone]. Patients son

## 2019-02-18 NOTE — Progress Notes (Signed)
PROGRESS NOTE                                                                                                                                                                                                             Patient Demographics:    [name] [name], is a 78 y.o. 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   Subjective:    [name] [name] today has, No headache, No chest pain, No abdominal pain - No Nausea, No new weakness tingling or numbness, mild Cough - no SOB.    Assessment  & Plan :     1. 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   [place]    02/15/19 1810 02/15/19 2246 02/16/19 1418 02/17/19 0502 02/18/19 0240  DDIMER 3.99* 3.67* 3.09* 2.03* 2.14*  FERRITIN 686* 849* 681* 780*  --   LDH 265*  --   --   --   --   CRP 6.8* 7.0* 8.0* 7.8* 5.0*    Lab Results  Component Value Date   SARSCOV2NAA POSITIVE (A) 02/15/2019     SpO2: 95 % O2 Flow Rate (L/min): 2 L/min  Hepatic Function Latest Ref Rng & Units 02/18/2019 02/17/2019 02/16/2019  Total Protein 6.5 - 8.1 g/dL 6.2(L) 6.2(L) 6.7  Albumin 3.5 - 5.0 g/dL  2.7(L) 2.7(L) 3.0(L)  AST 15 - 41 U/L 43(H) 38 55(H)  ALT 0 - 44 U/L _0 Alk Phosphatase 38 - 126 U/L 60 56 58  Total Bilirubin 0.3 -  1.2 mg/dL 0.6 0.1(L) 0.5        Component Value Date/Time   BNP 303.7 (H) 02/18/2019 0240    2.  Acute renal failure on chronic kidney disease stage III/metabolic acidosis -   Likely secondary to prerenal azotemia in the setting of diuretics, ACE inhibitor.  baseline Creat 1.9, improved, monitor - Hold ACE.  3.  COPD - Continue Combivent.  Albuterol as needed.  Continue O2.  Stable.   4.  Chronic diastolic CHF -  Patient looks more on the dry side.  EF of 55 to 60% with grade 1 diastolic dysfunction per 2D echo 10/29/2015.  Continue Lopressor, Pravachol, Imdur, hydralazine, diltiazem, few rales and + BNP hence start Lasix.  Follow.  5.  SLE - Continue Plaquenil.  6. Anemia of chronic disease - Hemoglobin stable.  Follow.   7.  Hypertension - Blood pressure stable.  Continue home regimen diltiazem, hydralazine, Imdur, Lopressor.  Continue to hold diuretic and ACE inhibitor secondary to acute on chronic kidney disease.  8.  Hyperlipidemia - Continue statin.  9.   Insulin-dependent diabetes mellitus -  Hemoglobin A1c 8.9.  suggesting poor outpt control due to Hyperglycemia, CBGs high here, Insulin adjusted 02/18/19  CBG (last 3)  Recent Labs    02/17/19 1646 02/17/19 2206 02/18/19 0744  GLUCAP 350* 432* 433*       Condition - Extremely Guarded  Family Communication  : Son 02/18/19   Code Status :  DNR  Diet :   Diet Order            Diet heart healthy/carb modified Room service appropriate? Yes; Fluid consistency: Thin  Diet effective now               Disposition Plan  :  Inpt  Consults  :  None  Procedures  :     PUD Prophylaxis :  PPI  DVT Prophylaxis  :    Heparin   Lab Results  Component Value Date   PLT 201 02/18/2019    Inpatient Medications  Scheduled Meds:  allopurinol  100 mg Oral Daily    aspirin  325 mg Oral Daily   brimonidine  1 drop Both Eyes BID   calcium carbonate  1,250 mg Oral QAC breakfast   cholecalciferol  1,000 Units Oral Daily   citalopram  10 mg Oral Daily   clobetasol ointment  1 application Topical BID   colchicine  0.6 mg Oral BID   diltiazem  300 mg Oral Daily   furosemide  40 mg Intravenous Once   furosemide  40 mg Oral BID   gabapentin  300 mg Oral BID   heparin  7,500 Units Subcutaneous Q8H   hydrALAZINE  50 mg Oral TID   hydroxychloroquine  200 mg Oral Daily   insulin aspart  0-15 Units Subcutaneous TID WC   insulin aspart  0-5 Units Subcutaneous QHS   insulin aspart  5 Units Subcutaneous TID WC   insulin glargine  35 Units Subcutaneous BID   Ipratropium-Albuterol  1 puff Inhalation Q6H   isosorbide mononitrate  30 mg Oral Daily   levofloxacin  500 mg Oral Q48H   loratadine  10 mg Oral Daily   methylPREDNISolone (SOLU-MEDROL) injection  40 mg Intravenous BID   metoprolol tartrate  50 mg Oral BID   mometasone-formoterol  2 puff Inhalation BID   oxybutynin  10 mg Oral Daily   pantoprazole  40 mg Oral Daily   pravastatin  40 mg Oral  QHS   sodium bicarbonate  650 mg Oral BID   vitamin C  500 mg Oral Daily   zinc sulfate  220 mg Oral Daily   Continuous Infusions:  remdesivir 100 mg in NS 250 mL 100 mg (02/17/19 1307)   PRN Meds:.acetaminophen, diphenhydrAMINE, levalbuterol, senna-docusate  Antibiotics  :    Anti-infectives (From admission, onward)   Start     Dose/Rate Route Frequency Ordered Stop   02/17/19 1700  levofloxacin (LEVAQUIN) tablet 500 mg     500 mg Oral Every 48 hours 02/16/19 0401     02/17/19 1200  remdesivir 100 mg in sodium chloride 0.9 % 250 mL IVPB     100 mg 500 mL/hr over 30 Minutes Intravenous Every 24 hours 02/16/19 0906 02/21/19 1559   02/16/19 1000  hydroxychloroquine (PLAQUENIL) tablet 200 mg     200 mg Oral Daily 02/15/19 2058     02/16/19 1000  levofloxacin (LEVAQUIN) tablet  500 mg  Status:  Discontinued     500 mg Oral Daily 02/15/19 2120 02/16/19 0401   02/16/19 1000  remdesivir 200 mg in sodium chloride 0.9 % 250 mL IVPB     200 mg 500 mL/hr over 30 Minutes Intravenous Once 02/16/19 0906 02/16/19 1301   02/15/19 2030  levofloxacin (LEVAQUIN) IVPB 500 mg     500 mg 100 mL/hr over 60 Minutes Intravenous  Once 02/15/19 2019 02/15/19 2218       Time Spent in minutes  30   Lala Lund M.D on 02/18/2019 at 9:14 AM  To page go to [email or web address] - password [place]  [facility] -  Office  [phone]   See all Orders from today for further details    Objective:   Vitals:   02/17/19 1943 02/17/19 2328 02/18/19 0305 02/18/19 0747  BP: (!) 130/55 (!) 124/56 128/60 (!) 122/59  Pulse: 69 72 67 60  Resp: (!) 22 (!) 24 (!) 25 20  Temp: 97.7 F (36.5 C) 97.8 F (36.6 C) 98.3 F (36.8 C) (!) 97.2 F (36.2 C)  TempSrc: Oral Oral Oral Axillary  SpO2: 94% 94% 95% 95%  Weight:      Height:        Wt Readings from Last 3 Encounters:  02/17/19 95.3 kg  02/04/19 102.5 kg  01/25/19 98.9 kg     Intake/Output Summary (Last 24 hours) at 02/18/2019 0914 Last data filed at 02/18/2019 0300 Gross per 24 hour  Intake 640 ml  Output 500 ml  Net 140 ml     Physical Exam  Awake Alert, Oriented X 3, No new F.N deficits, Normal affect [place].AT,PERRAL Supple Neck,No JVD, No cervical lymphadenopathy appriciated.  Symmetrical Chest wall movement, Good air movement bilaterally, few rales RRR,No Gallops,Rubs or new Murmurs, No Parasternal Heave +ve B.Sounds, Abd Soft, No tenderness, No organomegaly appriciated, No rebound - guarding or rigidity. No Cyanosis, Clubbing or edema, No new Rash or bruise       Data Review:    CBC Recent Labs  Lab 02/15/19 1810 02/15/19 2246 02/16/19 1418 02/17/19 0502 02/18/19 0240  WBC 5.4 4.8 4.1 2.7* 5.3  HGB 10.6* 10.6* 10.0* 10.0* 10.2*  HCT 34.8* 34.7* 33.7* 32.4* 32.1*  PLT 204 178 141* 165 201  MCV 94.1 94.0  98.5 92.8 91.2  MCH 28.6 28.7 29.2 28.7 29.0  MCHC 30.5 30.5 29.7* 30.9 31.8  RDW 13.8 14.0 13.9 13.7 13.8  LYMPHSABS 0.7 1.1  --  0.4* 0.4*  MONOABS 0.4 0.4  --  0.1 0.2  EOSABS 0.0 0.0  --  0.0 0.0  BASOSABS 0.0 0.0  --  0.0 0.0    Chemistries  Recent Labs  Lab 02/15/19 1810 02/15/19 2246 02/16/19 1418 02/17/19 0502 02/18/19 0240  NA 136 135 134* 134* 133*  K 3.7 3.7 4.0 4.5 4.5  CL 106 105 107 107 105  CO2 19* 20* 18* 17* 16*  GLUCOSE 143* 119* 135* 254* 467*  BUN 57* 60* 59* 68* 84*  CREATININE 2.63* 2.74* 2.52* 2.32* 2.29*  CALCIUM 9.1 8.8* 8.6* 8.2* 8.3*  MG  --  1.5*   1.4*  --  2.0 2.1  AST 56* 54* 55* 38 43*  ALT 34 33 _0 ALKPHOS 63 59 58 56 60  BILITOT 0.4 0.4 0.5 0.1* 0.6   ------------------------------------------------------------------------------------------------------------------ Recent Labs    02/15/19 1810  TRIG 124    Lab Results  Component Value Date   HGBA1C 8.9 (H) 02/15/2019   ------------------------------------------------------------------------------------------------------------------ No results for input(s): TSH, T4TOTAL, T3FREE, THYROIDAB in the last 72 hours.  Invalid input(s): FREET3  Cardiac Enzymes No results for input(s): CKMB, TROPONINI, MYOGLOBIN in the last 168 hours.  Invalid input(s): CK ------------------------------------------------------------------------------------------------------------------    Component Value Date/Time   BNP 303.7 (H) 02/18/2019 0240    Micro Results Recent Results (from the past 240 hour(s))  SARS CORONAVIRUS 2 (TAT 6-24 HRS) Nasopharyngeal Nasopharyngeal Swab     Status: Abnormal   Collection Time: 02/15/19  6:10 PM   Specimen: Nasopharyngeal Swab  Result Value Ref Range Status   SARS Coronavirus 2 POSITIVE (A) NEGATIVE Final    Comment: RESULT CALLED TO, READ BACK BY AND VERIFIED WITH: B.CERIC RN 0124 02/16/2019 MCCORMICK K (NOTE) SARS-CoV-2 target nucleic acids are  DETECTED. The SARS-CoV-2 RNA is generally detectable in upper and lower respiratory specimens during the acute phase of infection. Positive results are indicative of active infection with SARS-CoV-2. Clinical  correlation with patient history and other diagnostic information is necessary to determine patient infection status. Positive results do  not rule out bacterial infection or co-infection with other viruses. The expected result is Negative. Fact Sheet for Patients: SugarRoll.be Fact Sheet for Healthcare Providers: https://www.woods-mathews.com/ This test is not yet approved or cleared by the [place] FDA and  has been authorized for detection and/or diagnosis of SARS-CoV-2 by FDA under an Emergency Use Authorization (EUA). This EUA will remain  in effect (meaning this test can be used) fo r the duration of the COVID-19 declaration under Section 564(b)(1) of the Act, 21 U.S.C. section 360bbb-3(b)(1), unless the authorization is terminated or revoked sooner. Performed at [facility], [place] [address] [place], [place] [address]   Blood Culture (routine x 2)     Status: None (Preliminary result)   Collection Time: 02/15/19  6:10 PM   Specimen: BLOOD LEFT FOREARM  Result Value Ref Range Status   Specimen Description   Final    BLOOD LEFT FOREARM Performed at [facility], [place] [address], [place], [place] [address]    Special Requests   Final    BOTTLES DRAWN AEROBIC AND ANAEROBIC Blood Culture adequate volume Performed at [facility], [place] [address], [place], [place] [address]    Culture   Final    NO GROWTH 3 DAYS Performed at [facility], [place] [address], [place], [place] [address]    Report Status PENDING  Incomplete  Blood Culture (routine x 2)  Status: None (Preliminary result)   Collection Time: 02/15/19  6:10 PM   Specimen: BLOOD  Result Value Ref Range  Status   Specimen Description   Final    BLOOD RIGHT ANTECUBITAL Performed at [facility], [place] [address], [place], [place] [address]    Special Requests   Final    BOTTLES DRAWN AEROBIC AND ANAEROBIC Blood Culture adequate volume Performed at [facility], [place] [address] [place], [place] [address]    Culture   Final    NO GROWTH 3 DAYS Performed at [facility], [place] [address] [place], [place] [address]    Report Status PENDING  Incomplete    Radiology Reports Dg Chest Port 1 View  Result Date: 02/15/2019 CLINICAL DATA:  Viral pneumonia EXAM: PORTABLE CHEST 1 VIEW COMPARISON:  12/22/2014 FINDINGS: The heart size is enlarged. Aortic calcifications are noted. There are scattered bilateral ground-glass airspace opacities, greatest within the left lower lobe. There is no pneumothorax. No large pleural effusion. There are end-stage degenerative changes of the left glenohumeral joint. There is no acute osseous abnormality. IMPRESSION: Bilateral ground-glass airspace opacities which can be seen in patients with viral pneumonia. Electronically Signed   By: Constance Holster M.D.   On: 02/15/2019 19:25   Vas [place] Lower Extremity Venous (dvt)  Result Date: 01/25/2019  Lower Venous Study Indications: Pain.  Risk Factors: Remote history. Performing Technologist: Ralene Cork RVT  Examination Guidelines: A complete evaluation includes B-mode imaging, spectral Doppler, color Doppler, and power Doppler as needed of all accessible portions of each vessel. Bilateral testing is considered an integral part of a complete examination. Limited examinations for reoccurring indications may be performed as noted.  +---------+---------------+---------+-----------+----------+--------------+  LEFT      Compressibility Phasicity Spontaneity Properties Thrombus Aging  +---------+---------------+---------+-----------+----------+--------------+  CFV       Full             Yes       Yes                                    +---------+---------------+---------+-----------+----------+--------------+  SFJ       Full                      Yes                                    +---------+---------------+---------+-----------+----------+--------------+  FV Prox   Full            Yes       Yes                                    +---------+---------------+---------+-----------+----------+--------------+  FV Mid    Full            Yes       Yes                                    +---------+---------------+---------+-----------+----------+--------------+  FV Distal Full            Yes       Yes                                    +---------+---------------+---------+-----------+----------+--------------+  POP       Full            Yes       Yes                                    +---------+---------------+---------+-----------+----------+--------------+  PTV       Full                      Yes                                    +---------+---------------+---------+-----------+----------+--------------+  PERO      Full                                                             +---------+---------------+---------+-----------+----------+--------------+  GSV       Full            Yes       Yes                                    +---------+---------------+---------+-----------+----------+--------------+  Findings reported to Brynn at 3:25pm.  Summary: Left: There is no obvious evidence of deep vein thrombosis in the lower extremity.There is no evidence of superficial venous thrombosis. Veins are pulsatile.  *See table(s) above for measurements and observations. Electronically signed by Servando Snare MD on 01/25/2019 at 4:06:06 PM.    Final

## 2019-02-18 NOTE — Evaluation (Signed)
Physical Therapy Evaluation Patient Details Name: [name] [name] MRN: [id number] DOB: 1941-02-15 Today's Date: 02/18/2019   History of Present Illness  [name] [name] KIMM is a 78 y.o. 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  Clinical Impression  The patient required 2 mod/ma.sign   assist to sit on bed edge and then  transfer to [facility] using RW. Patient  Taking small shuffle steps. Patient then stood and shuffled steps to recliner with 2 assist for stability and balance. Per  Patient, from home, is alone  Except for several hours in daytime when  caregiver is there. Recommend SNF for rehab as patient is requiring 2 assist for mobility. Pt admitted with above diagnosis.   Patient 's SPO2 dropped to 84% on RA,Placed on 2 L, RN aware . Patient also noted  Have  Drop on Bp: supine 122/59 @ 8AM, 106/49 after transfer to recliner(67), 99/49  Then 103/54(65. HR stable at 59-65. RN aware of BP. Patient did appear [pale while sitting, patient reports no dizziness. Continue PT.  Pt currently with functional limitations due to the deficits listed below (see PT Problem List). Pt will benefit from skilled PT to increase their independence and safety with mobility to allow discharge to the venue listed below.     Follow Up Recommendations SNF;Supervision/Assistance - 24 hour    Equipment Recommendations  None recommended by PT    Recommendations for Other Services       Precautions / Restrictions Precautions Precautions: Fall Precaution Comments: BP low with sitting/standing      Mobility  Bed Mobility Overal bed mobility: Needs Assistance Bed Mobility: Rolling;Sidelying to Sit Rolling: Mod assist Sidelying to sit: Max assist;+2 for  physical assistance;+2 for safety/equipment       General bed mobility comments: multimodal cues   , extra time to initiate , required assist with legs and trunk, and scooting to bed edge  Transfers Overall transfer level: Needs assistance Equipment used: Rolling walker (2 wheeled) Transfers: Sit to/from [facility] Sit to Stand: Max assist;+2 physical assistance;+2 safety/equipment;From elevated surface Stand pivot transfers: +2 safety/equipment;+2 physical assistance;Max assist       General transfer comment: 2 assist to rise from bed and  BSC, cues for hand placem,ent. small shuffle steps from bed to [facility] then to recliner, Decreased step length of the left leg, more of a dragging the leg.  Ambulation/Gait             General Gait Details: NT  Stairs            Wheelchair Mobility    Modified Rankin (Stroke Patients Only)       Balance Overall balance assessment: Needs assistance Sitting-balance support: Bilateral upper extremity supported;Feet supported Sitting balance-Leahy Scale: Poor Sitting balance - Comments: steady assist to sit upright. Postural control: Posterior lean Standing balance support: During functional activity;Bilateral upper extremity supported Standing balance-Leahy Scale: Poor Standing balance comment: reliant on RW, at times,  not pushing down into RW.                             Pertinent Vitals/Pain Pain Assessment: No/denies pain    Home Living  Family/patient expects to be discharged to:: Private residence   Available Help at Discharge: Personal care attendant Type of Home: Apartment Home Access: Level entry     Home Layout: One level Home Equipment: [place] - 4 wheels;Walker - 2 wheels      Prior Function Level of Independence: Needs assistance   Gait / Transfers Assistance Needed: used RW  ADL's / Homemaking Assistance Needed: pt reports aid helps with cleaning, cooking,        Hand  Dominance        Extremity/Trunk Assessment        Lower Extremity Assessment Lower Extremity Assessment: Generalized weakness    Cervical / Trunk Assessment Cervical / Trunk Assessment: Normal  Communication      Cognition Arousal/Alertness: Awake/alert Behavior During Therapy: WFL for tasks assessed/performed;Flat affect Overall Cognitive Status: No family/caregiver present to determine baseline cognitive functioning Area of Impairment: Orientation                               General Comments: patient stated end of October,  at the [facility] [place] and new she has covid      General Comments      Exercises     Assessment/Plan    PT Assessment Patient needs continued PT services  PT Problem List Decreased strength;Decreased mobility;Decreased safety awareness;Decreased knowledge of precautions;Obesity;Decreased activity tolerance;Decreased cognition;Cardiopulmonary status limiting activity;Decreased balance       PT Treatment Interventions DME instruction;Therapeutic activities;Cognitive remediation;Gait training;Therapeutic exercise;Patient/family education;Functional mobility training    PT Goals (Current goals can be found in the Care Plan section)  Acute Rehab PT Goals Patient Stated Goal: agreed to OOB PT Goal Formulation: Patient unable to participate in goal setting Time For Goal Achievement: 03/04/19 Potential to Achieve Goals: Fair    Frequency Min 2X/week   Barriers to discharge Decreased caregiver support      Co-evaluation PT/OT/SLP Co-Evaluation/Treatment: Yes Reason for Co-Treatment: For patient/therapist safety;To address functional/ADL transfers PT goals addressed during session: Mobility/safety with mobility         AM-PAC PT "6 Clicks" Mobility  Outcome Measure Help needed turning from your back to your side while in a flat bed without using bedrails?: Total Help needed moving from lying on your back to sitting on the side  of a flat bed without using bedrails?: Total Help needed moving to and from a bed to a chair (including a wheelchair)?: A Lot Help needed standing up from a chair using your arms (e.g., wheelchair or bedside chair)?: A Lot Help needed to walk in hospital room?: Total Help needed climbing 3-5 steps with a railing? : Total 6 Click Score: 8    End of Session Equipment Utilized During Treatment: Gait belt;Oxygen Activity Tolerance: Patient limited by fatigue Patient left: in chair;with call bell/phone within reach;with chair alarm set Nurse Communication: Mobility status(BP drop and  needed 2 L O2) PT Visit Diagnosis: Difficulty in walking, not elsewhere classified (R26.2);Unsteadiness on feet (R26.81)    Time: 4696-2952 PT Time Calculation (min) (ACUTE ONLY): 53 min   Charges:   PT Evaluation $PT Eval Moderate Complexity: 1 Mod PT Treatments $Therapeutic Activity: 8-22 mins        Tresa Endo PT Acute Rehabilitation Services  Office [phone]   Claretha Cooper 02/18/2019, 1:44 PM

## 2019-02-18 NOTE — Progress Notes (Signed)
   02/18/19 0747  Family/Significant Other Communication  Family/Significant Other Update Called;Updated (Patients son)

## 2019-02-19 LAB — COMPREHENSIVE METABOLIC PANEL
ALT: 26 U/L (ref 0–44)
AST: 34 U/L (ref 15–41)
Albumin: 2.8 g/dL — ABNORMAL LOW (ref 3.5–5.0)
Alkaline Phosphatase: 56 U/L (ref 38–126)
Anion gap: 9 (ref 5–15)
BUN: 91 mg/dL — ABNORMAL HIGH (ref 8–23)
CO2: 17 mmol/L — ABNORMAL LOW (ref 22–32)
Calcium: 8.3 mg/dL — ABNORMAL LOW (ref 8.9–10.3)
Chloride: 108 mmol/L (ref 98–111)
Creatinine, Ser: 2.21 mg/dL — ABNORMAL HIGH (ref 0.44–1.00)
GFR calc Af Amer: 24 mL/min — ABNORMAL LOW (ref >60)
GFR calc non Af Amer: 21 mL/min — ABNORMAL LOW (ref >60)
Glucose, Bld: 221 mg/dL — ABNORMAL HIGH (ref 70–99)
Potassium: 4.3 mmol/L (ref 3.5–5.1)
Sodium: 134 mmol/L — ABNORMAL LOW (ref 135–145)
Total Bilirubin: 0.7 mg/dL (ref 0.3–1.2)
Total Protein: 6.1 g/dL — ABNORMAL LOW (ref 6.5–8.1)

## 2019-02-19 LAB — MAGNESIUM: Magnesium: 2 mg/dL (ref 1.7–2.4)

## 2019-02-19 LAB — GLUCOSE, CAPILLARY
Glucose-Capillary: 238 mg/dL — ABNORMAL HIGH (ref 70–99)
Glucose-Capillary: 257 mg/dL — ABNORMAL HIGH (ref 70–99)
Glucose-Capillary: 256 mg/dL — ABNORMAL HIGH (ref 70–99)
Glucose-Capillary: 261 mg/dL — ABNORMAL HIGH (ref 70–99)

## 2019-02-19 LAB — CBC WITH DIFFERENTIAL/PLATELET
Abs Immature Granulocytes: 0.06 10*3/uL (ref 0.00–0.07)
Basophils Absolute: 0 10*3/uL (ref 0.0–0.1)
Basophils Relative: 0 %
Eosinophils Absolute: 0 10*3/uL (ref 0.0–0.5)
Eosinophils Relative: 0 %
HCT: 33.4 % — ABNORMAL LOW (ref 36.0–46.0)
Hemoglobin: 10.6 g/dL — ABNORMAL LOW (ref 12.0–15.0)
Immature Granulocytes: 1 %
Lymphocytes Relative: 6 %
Lymphs Abs: 0.5 10*3/uL — ABNORMAL LOW (ref 0.7–4.0)
MCH: 28.9 pg (ref 26.0–34.0)
MCHC: 31.7 g/dL (ref 30.0–36.0)
MCV: 91 fL (ref 80.0–100.0)
Monocytes Absolute: 0.1 10*3/uL (ref 0.1–1.0)
Monocytes Relative: 2 %
Neutro Abs: 7.4 10*3/uL (ref 1.7–7.7)
Neutrophils Relative %: 91 %
Platelets: 235 10*3/uL (ref 150–400)
RBC: 3.67 MIL/uL — ABNORMAL LOW (ref 3.87–5.11)
RDW: 13.9 % (ref 11.5–15.5)
WBC: 8 10*3/uL (ref 4.0–10.5)
nRBC: 0 % (ref 0.0–0.2)

## 2019-02-19 LAB — D-DIMER, QUANTITATIVE: D-Dimer, Quant: 1.86 ug/mL-FEU — ABNORMAL HIGH (ref 0.00–0.50)

## 2019-02-19 LAB — C-REACTIVE PROTEIN: CRP: 2.5 mg/dL — ABNORMAL HIGH (ref <1.0)

## 2019-02-19 LAB — BRAIN NATRIURETIC PEPTIDE: B Natriuretic Peptide: 133.6 pg/mL — ABNORMAL HIGH (ref 0.0–100.0)

## 2019-02-19 MED ORDER — HYDRALAZINE HCL 10 MG PO TABS
10.0000 mg | ORAL_TABLET | Freq: Three times a day (TID) | ORAL | Status: DC
  Administered 2019-02-19 – 2019-02-21 (×7): 10 mg via ORAL
  Filled 2019-02-19 (×11): qty 1

## 2019-02-19 MED ORDER — SPIRONOLACTONE 25 MG PO TABS
25.0000 mg | ORAL_TABLET | Freq: Once | ORAL | Status: AC
  Administered 2019-02-19: 09:00:00 25 mg via ORAL
  Filled 2019-02-19: qty 1

## 2019-02-19 MED ORDER — COLCHICINE 0.6 MG PO TABS
0.3000 mg | ORAL_TABLET | Freq: Every day | ORAL | Status: DC
  Administered 2019-02-20 – 2019-02-21 (×2): 0.3 mg via ORAL
  Filled 2019-02-19 (×3): qty 0.5

## 2019-02-19 MED ORDER — METOPROLOL TARTRATE 25 MG PO TABS
25.0000 mg | ORAL_TABLET | Freq: Two times a day (BID) | ORAL | Status: DC
  Administered 2019-02-19 – 2019-02-21 (×4): 25 mg via ORAL
  Filled 2019-02-19 (×4): qty 1

## 2019-02-19 MED ORDER — ISOSORBIDE MONONITRATE ER 30 MG PO TB24
15.0000 mg | ORAL_TABLET | Freq: Every day | ORAL | Status: DC
  Administered 2019-02-19 – 2019-02-21 (×3): 15 mg via ORAL
  Filled 2019-02-19: qty 1

## 2019-02-19 MED ORDER — METHYLPREDNISOLONE SODIUM SUCC 40 MG IJ SOLR
40.0000 mg | Freq: Every day | INTRAMUSCULAR | Status: DC
  Administered 2019-02-20 – 2019-02-21 (×2): 40 mg via INTRAVENOUS
  Filled 2019-02-19 (×2): qty 1

## 2019-02-19 MED ORDER — FUROSEMIDE 10 MG/ML IJ SOLN
40.0000 mg | Freq: Two times a day (BID) | INTRAMUSCULAR | Status: DC
  Administered 2019-02-19 – 2019-02-20 (×3): 40 mg via INTRAVENOUS
  Filled 2019-02-19 (×3): qty 4

## 2019-02-19 NOTE — TOC Progression Note (Signed)
Transition of Care [facility]) - Progression Note    Patient Details  Name: [name] [name] MRN: [id number] Date of Birth: 05-24-1940  [facility] CM/SW Contact  Joaquin Courts, RN Phone Number: 02/19/2019, 11:52 AM  Clinical Narrative:    Patient admitted from home. Cm spoke with her via telephone to discuss recommendation for SNF. Patient declines SNF at this time. Reports she lives alone in her apartment but does have an aide that comes 5 days a week from 8-5. She plans to return back home when she is medically stable. States she has all necessary dme.    Expected Discharge Plan: Home/Self Care Barriers to Discharge: Continued Medical Work up  Expected Discharge Plan and Services Expected Discharge Plan: Home/Self Care   Discharge Planning Services: CM Consult   Living arrangements for the past 2 months: Apartment                 DME Arranged: N/A DME Agency: NA       HH Arranged: NA HH Agency: NA         Social Determinants of Health (SDOH) Interventions    Readmission Risk Interventions No flowsheet data found.

## 2019-02-19 NOTE — Progress Notes (Signed)
PROGRESS NOTE                                                                                                                                                                                                             Patient Demographics:    [name] [name], is a 78 y.o. female, DOB - May 15, 1940, MBO:[id number]  Outpatient Primary MD for the patient is Nolene Ebbs, MD    LOS - 4  Admit date - 02/15/2019    Chief Complaint  Patient presents with  . Fever  . Weakness  . 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   Subjective:   Patient in bed, appears comfortable, denies any headache, no fever, no chest pain or pressure, improved shortness of breath , no abdominal pain. No focal weakness.     Assessment  & Plan :     1. 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  Clinically appears to have stabilized and gradually improving.  Will taper Solu-Medrol on 02/19/2019.   COVID-19 Labs  Recent Labs    02/16/19 1418 02/17/19 0502 02/18/19 0240 02/19/19 0105  DDIMER 3.09* 2.03* 2.14* 1.86*  FERRITIN 681* 780*  --   --   CRP 8.0* 7.8* 5.0* 2.5*    Lab Results  Component Value Date   SARSCOV2NAA POSITIVE (A) 02/15/2019     SpO2: 95 % O2 Flow Rate (L/min): 2 L/min  Hepatic Function Latest Ref Rng & Units 02/19/2019 02/18/2019 02/17/2019  Total  Protein 6.5 - 8.1 g/dL 6.1(L) 6.2(L) 6.2(L)  Albumin 3.5 - 5.0 g/dL 2.8(L) 2.7(L) 2.7(L)  AST 15 - 41 U/L 34 43(H) 38  ALT 0 - 44 U/L '26 29 26  '$ Alk Phosphatase 38 - 126 U/L 56 60 56  Total Bilirubin 0.3 - 1.2 mg/dL 0.7  0.6 0.1(L)        Component Value Date/Time   BNP 133.6 (H) 02/19/2019 0105    2.  Acute renal failure on chronic kidney disease stage III/metabolic acidosis -   Likely secondary to prerenal azotemia in the setting of diuretics, ACE inhibitor.  baseline Creat 1.9, improved, monitor on IV Lasix and continue to hold  ACE.  3.  COPD - Continue Combivent.  Albuterol as needed.  Continue O2.  Stable.   4.  Chronic diastolic CHF -  Patient looks more on the dry side.  EF of 55 to 60% with grade 1 diastolic dysfunction per 2D echo 10/29/2015.  Continue Lopressor, Pravachol, Imdur, hydralazine, diltiazem, few rales and + BNP hence have started IV Lasix.  Follow renal function which seems to be improving with diuresis.  5.  SLE - Continue Plaquenil.  6. Anemia of chronic disease - Hemoglobin stable.  Follow.   7.  Hypertension - Blood pressure stable.  Continue home regimen diltiazem, hydralazine, Imdur, Lopressor.  Continue to hold diuretic and ACE inhibitor secondary to acute on chronic kidney disease.  8.  Hyperlipidemia - Continue statin.  9.   Insulin-dependent diabetes mellitus - currently on combination of Lantus, sliding scale and premeal NovoLog.  Hemoglobin A1c 8.9.  suggesting poor outpt control due to Hyperglycemia, CBGs high here, have tapered Solu-Medrol on 02/19/2019 will monitor CBGs closely.  CBG (last 3)  Recent Labs    02/18/19 1631 02/18/19 2030 02/19/19 0759  GLUCAP 197* 173* 238*       Condition - Extremely Guarded  Family Communication  : Son 02/18/19   Code Status :  DNR  Diet :   Diet Order            Diet heart healthy/carb modified Room service appropriate? Yes; Fluid consistency: Thin  Diet effective now                Disposition Plan  :  Inpt  Consults  :  None  Procedures  :     PUD Prophylaxis :  PPI  DVT Prophylaxis  :    Heparin   Lab Results  Component Value Date   PLT 235 02/19/2019    Inpatient Medications  Scheduled Meds: . allopurinol  100 mg Oral Daily  . aspirin  325 mg Oral Daily  . brimonidine  1 drop Both Eyes BID  . calcium carbonate  1,250 mg Oral QAC breakfast  . cholecalciferol  1,000 Units Oral Daily  . citalopram  10 mg Oral Daily  . clobetasol ointment  1 application Topical BID  . colchicine  0.6 mg Oral BID  . diltiazem  300 mg Oral Daily  . furosemide  40 mg Intravenous BID  . gabapentin  300 mg Oral BID  . heparin  7,500 Units Subcutaneous Q8H  . hydrALAZINE  10 mg Oral TID  . hydroxychloroquine  200 mg Oral Daily  . insulin aspart  0-15 Units Subcutaneous TID WC  . insulin aspart  0-5 Units Subcutaneous QHS  . insulin aspart  5 Units Subcutaneous TID WC  . insulin glargine  35 Units Subcutaneous BID  . Ipratropium-Albuterol  1 puff Inhalation Q6H  . isosorbide mononitrate  15 mg Oral Daily  . levofloxacin  500 mg Oral Q48H  . loratadine  10 mg Oral Daily  . methylPREDNISolone (SOLU-MEDROL) injection  40 mg Intravenous BID  . metoprolol tartrate  25 mg Oral BID  . mometasone-formoterol  2 puff Inhalation BID  .  oxybutynin  10 mg Oral Daily  . pantoprazole  40 mg Oral Daily  . pravastatin  40 mg Oral QHS  . sodium bicarbonate  650 mg Oral BID  . spironolactone  25 mg Oral Once  . vitamin C  500 mg Oral Daily  . zinc sulfate  220 mg Oral Daily   Continuous Infusions: . remdesivir 100 mg in NS 250 mL 100 mg (02/18/19 1643)   PRN Meds:.acetaminophen, levalbuterol, senna-docusate  Antibiotics  :    Anti-infectives (From admission, onward)   Start     Dose/Rate Route Frequency Ordered Stop   02/17/19 1700  levofloxacin (LEVAQUIN) tablet 500 mg     500 mg Oral Every 48 hours 02/16/19 0401     02/17/19 1200  remdesivir 100 mg in sodium chloride 0.9 %  250 mL IVPB     100 mg 500 mL/hr over 30 Minutes Intravenous Every 24 hours 02/16/19 0906 02/21/19 1559   02/16/19 1000  hydroxychloroquine (PLAQUENIL) tablet 200 mg     200 mg Oral Daily 02/15/19 2058     02/16/19 1000  levofloxacin (LEVAQUIN) tablet 500 mg  Status:  Discontinued     500 mg Oral Daily 02/15/19 2120 02/16/19 0401   02/16/19 1000  remdesivir 200 mg in sodium chloride 0.9 % 250 mL IVPB     200 mg 500 mL/hr over 30 Minutes Intravenous Once 02/16/19 0906 02/16/19 1301   02/15/19 2030  levofloxacin (LEVAQUIN) IVPB 500 mg     500 mg 100 mL/hr over 60 Minutes Intravenous  Once 02/15/19 2019 02/15/19 2218       Time Spent in minutes  30   Lala Lund M.D on 02/19/2019 at 9:10 AM  To page go to [email or web address] - password [facility]  [facility] -  Office  [phone]   See all Orders from today for further details    Objective:   Vitals:   02/18/19 1935 02/19/19 0420 02/19/19 0500 02/19/19 0759  BP: 117/63 122/60  (!) 157/72  Pulse: 64 66  73  Resp: 19 18  (!) 21  Temp: 98 F (36.7 C) 98.1 F (36.7 C)  97.7 F (36.5 C)  TempSrc: Oral Oral    SpO2: 95% 98%  95%  Weight:   95 kg   Height:        Wt Readings from Last 3 Encounters:  02/19/19 95 kg  02/04/19 102.5 kg  01/25/19 98.9 kg     Intake/Output Summary (Last 24 hours) at 02/19/2019 0910 Last data filed at 02/19/2019 0400 Gross per 24 hour  Intake 360 ml  Output 400 ml  Net -40 ml     Physical Exam  Awake Alert,   No new F.N deficits,   [place].AT,PERRAL Supple Neck,No JVD, No cervical lymphadenopathy appriciated.  Symmetrical Chest wall movement, Good air movement bilaterally, few rales RRR,No Gallops, Rubs or new Murmurs, No Parasternal Heave +ve B.Sounds, Abd Soft, No tenderness, No organomegaly appriciated, No rebound - guarding or rigidity. No Cyanosis, Clubbing or edema, No new Rash or bruise   Data Review:    CBC Recent Labs  Lab 02/15/19 1810 02/15/19 2246 02/16/19 1418  02/17/19 0502 02/18/19 0240 02/19/19 0105  WBC 5.4 4.8 4.1 2.7* 5.3 8.0  HGB 10.6* 10.6* 10.0* 10.0* 10.2* 10.6*  HCT 34.8* 34.7* 33.7* 32.4* 32.1* 33.4*  PLT 204 178 141* 165 201 235  MCV 94.1 94.0 98.5 92.8 91.2 91.0  MCH 28.6 28.7 29.2 28.7 29.0 28.9  MCHC 30.5 30.5 29.7*  30.9 31.8 31.7  RDW 13.8 14.0 13.9 13.7 13.8 13.9  LYMPHSABS 0.7 1.1  --  0.4* 0.4* 0.5*  MONOABS 0.4 0.4  --  0.1 0.2 0.1  EOSABS 0.0 0.0  --  0.0 0.0 0.0  BASOSABS 0.0 0.0  --  0.0 0.0 0.0    Chemistries  Recent Labs  Lab 02/15/19 2246 02/16/19 1418 02/17/19 0502 02/18/19 0240 02/19/19 0105  NA 135 134* 134* 133* 134*  K 3.7 4.0 4.5 4.5 4.3  CL 105 107 107 105 108  CO2 20* 18* 17* 16* 17*  GLUCOSE 119* 135* 254* 467* 221*  BUN 60* 59* 68* 84* 91*  CREATININE 2.74* 2.52* 2.32* 2.29* 2.21*  CALCIUM 8.8* 8.6* 8.2* 8.3* 8.3*  MG 1.5*  1.4*  --  2.0 2.1 2.0  AST 54* 55* 38 43* 34  ALT 33 '31 26 29 26  '$ ALKPHOS 59 58 56 60 56  BILITOT 0.4 0.5 0.1* 0.6 0.7   ------------------------------------------------------------------------------------------------------------------ No results for input(s): CHOL, HDL, LDLCALC, TRIG, CHOLHDL, LDLDIRECT in the last 72 hours.  Lab Results  Component Value Date   HGBA1C 8.9 (H) 02/15/2019   ------------------------------------------------------------------------------------------------------------------ No results for input(s): TSH, T4TOTAL, T3FREE, THYROIDAB in the last 72 hours.  Invalid input(s): FREET3  Cardiac Enzymes No results for input(s): CKMB, TROPONINI, MYOGLOBIN in the last 168 hours.  Invalid input(s): CK ------------------------------------------------------------------------------------------------------------------    Component Value Date/Time   BNP 133.6 (H) 02/19/2019 0105    Micro Results Recent Results (from the past 240 hour(s))  SARS CORONAVIRUS 2 (TAT 6-24 HRS) Nasopharyngeal Nasopharyngeal Swab     Status: Abnormal   Collection  Time: 02/15/19  6:10 PM   Specimen: Nasopharyngeal Swab  Result Value Ref Range Status   SARS Coronavirus 2 POSITIVE (A) NEGATIVE Final    Comment: RESULT CALLED TO, READ BACK BY AND VERIFIED WITH: B.CERIC RN 0124 02/16/2019 MCCORMICK K (NOTE) SARS-CoV-2 target nucleic acids are DETECTED. The SARS-CoV-2 RNA is generally detectable in upper and lower respiratory specimens during the acute phase of infection. Positive results are indicative of active infection with SARS-CoV-2. Clinical  correlation with patient history and other diagnostic information is necessary to determine patient infection status. Positive results do  not rule out bacterial infection or co-infection with other viruses. The expected result is Negative. Fact Sheet for Patients: SugarRoll.be Fact Sheet for Healthcare Providers: https://www.woods-mathews.com/ This test is not yet approved or cleared by the [place] FDA and  has been authorized for detection and/or diagnosis of SARS-CoV-2 by FDA under an Emergency Use Authorization (EUA). This EUA will remain  in effect (meaning this test can be used) fo r the duration of the COVID-19 declaration under Section 564(b)(1) of the Act, 21 U.S.C. section 360bbb-3(b)(1), unless the authorization is terminated or revoked sooner. Performed at [facility], [place] [address], [place], [place] [address]   Blood Culture (routine x 2)     Status: None (Preliminary result)   Collection Time: 02/15/19  6:10 PM   Specimen: BLOOD LEFT FOREARM  Result Value Ref Range Status   Specimen Description   Final    BLOOD LEFT FOREARM Performed at [facility], [place] [address] [place], [place] [address]    Special Requests   Final    BOTTLES DRAWN AEROBIC AND ANAEROBIC Blood Culture adequate volume Performed at [facility], [place] [address] [place],  [address]    Culture   Final     NO GROWTH 3  DAYS Performed at [facility], [place] [address], [place], [place] [address]    Report Status PENDING  Incomplete  Blood Culture (routine x 2)     Status: None (Preliminary result)   Collection Time: 02/15/19  6:10 PM   Specimen: BLOOD  Result Value Ref Range Status   Specimen Description   Final    BLOOD RIGHT ANTECUBITAL Performed at [facility], [place] [address] [place], [place] [address]    Special Requests   Final    BOTTLES DRAWN AEROBIC AND ANAEROBIC Blood Culture adequate volume Performed at [facility], [place] [address], [place], [place] [address]    Culture   Final    NO GROWTH 3 DAYS Performed at [facility], [place] [address], [place],  [address]    Report Status PENDING  Incomplete    Radiology Reports Dg Chest Port 1 View  Result Date: 02/15/2019 CLINICAL DATA:  Viral pneumonia EXAM: PORTABLE CHEST 1 VIEW COMPARISON:  12/22/2014 FINDINGS: The heart size is enlarged. Aortic calcifications are noted. There are scattered bilateral ground-glass airspace opacities, greatest within the left lower lobe. There is no pneumothorax. No large pleural effusion. There are end-stage degenerative changes of the left glenohumeral joint. There is no acute osseous abnormality. IMPRESSION: Bilateral ground-glass airspace opacities which can be seen in patients with viral pneumonia. Electronically Signed   By: Constance Holster M.D.   On: 02/15/2019 19:25   Vas [place] Lower Extremity Venous (dvt)  Result Date: 01/25/2019  Lower Venous Study Indications: Pain.  Risk Factors: Remote history. Performing Technologist: Ralene Cork RVT  Examination Guidelines: A complete evaluation includes B-mode imaging, spectral Doppler, color Doppler, and power Doppler as needed of all accessible portions of each vessel. Bilateral testing is considered an integral part of a complete examination. Limited examinations for reoccurring  indications may be performed as noted.  +---------+---------------+---------+-----------+----------+--------------+ LEFT     CompressibilityPhasicitySpontaneityPropertiesThrombus Aging +---------+---------------+---------+-----------+----------+--------------+ CFV      Full           Yes      Yes                                 +---------+---------------+---------+-----------+----------+--------------+ SFJ      Full                    Yes                                 +---------+---------------+---------+-----------+----------+--------------+ FV Prox  Full           Yes      Yes                                 +---------+---------------+---------+-----------+----------+--------------+ FV Mid   Full           Yes      Yes                                 +---------+---------------+---------+-----------+----------+--------------+ FV DistalFull           Yes      Yes                                 +---------+---------------+---------+-----------+----------+--------------+  POP      Full           Yes      Yes                                 +---------+---------------+---------+-----------+----------+--------------+ PTV      Full                    Yes                                 +---------+---------------+---------+-----------+----------+--------------+ PERO     Full                                                        +---------+---------------+---------+-----------+----------+--------------+ GSV      Full           Yes      Yes                                 +---------+---------------+---------+-----------+----------+--------------+  Findings reported to Brynn at 3:25pm.  Summary: Left: There is no obvious evidence of deep vein thrombosis in the lower extremity.There is no evidence of superficial venous thrombosis. Veins are pulsatile.  *See table(s) above for measurements and observations. Electronically signed by Servando Snare MD on  01/25/2019 at 4:06:06 PM.    Final

## 2019-02-20 DIAGNOSIS — Z794 Long term (current) use of insulin: Secondary | ICD-10-CM

## 2019-02-20 DIAGNOSIS — M329 Systemic lupus erythematosus, unspecified: Secondary | ICD-10-CM

## 2019-02-20 DIAGNOSIS — J449 Chronic obstructive pulmonary disease, unspecified: Secondary | ICD-10-CM

## 2019-02-20 DIAGNOSIS — E1169 Type 2 diabetes mellitus with other specified complication: Secondary | ICD-10-CM

## 2019-02-20 DIAGNOSIS — E1142 Type 2 diabetes mellitus with diabetic polyneuropathy: Secondary | ICD-10-CM

## 2019-02-20 DIAGNOSIS — E785 Hyperlipidemia, unspecified: Secondary | ICD-10-CM

## 2019-02-20 LAB — GLUCOSE, CAPILLARY
Glucose-Capillary: 247 mg/dL — ABNORMAL HIGH (ref 70–99)
Glucose-Capillary: 222 mg/dL — ABNORMAL HIGH (ref 70–99)
Glucose-Capillary: 193 mg/dL — ABNORMAL HIGH (ref 70–99)
Glucose-Capillary: 154 mg/dL — ABNORMAL HIGH (ref 70–99)

## 2019-02-20 LAB — CBC WITH DIFFERENTIAL/PLATELET
Abs Immature Granulocytes: 0.12 10*3/uL — ABNORMAL HIGH (ref 0.00–0.07)
Basophils Absolute: 0 10*3/uL (ref 0.0–0.1)
Basophils Relative: 0 %
Eosinophils Absolute: 0 10*3/uL (ref 0.0–0.5)
Eosinophils Relative: 0 %
HCT: 35.9 % — ABNORMAL LOW (ref 36.0–46.0)
Hemoglobin: 11.5 g/dL — ABNORMAL LOW (ref 12.0–15.0)
Immature Granulocytes: 1 %
Lymphocytes Relative: 5 %
Lymphs Abs: 0.5 10*3/uL — ABNORMAL LOW (ref 0.7–4.0)
MCH: 28.6 pg (ref 26.0–34.0)
MCHC: 32 g/dL (ref 30.0–36.0)
MCV: 89.3 fL (ref 80.0–100.0)
Monocytes Absolute: 0.3 10*3/uL (ref 0.1–1.0)
Monocytes Relative: 3 %
Neutro Abs: 8.7 10*3/uL — ABNORMAL HIGH (ref 1.7–7.7)
Neutrophils Relative %: 91 %
Platelets: 258 10*3/uL (ref 150–400)
RBC: 4.02 MIL/uL (ref 3.87–5.11)
RDW: 13.8 % (ref 11.5–15.5)
WBC: 9.5 10*3/uL (ref 4.0–10.5)
nRBC: 0 % (ref 0.0–0.2)

## 2019-02-20 LAB — COMPREHENSIVE METABOLIC PANEL
ALT: 37 U/L (ref 0–44)
AST: 60 U/L — ABNORMAL HIGH (ref 15–41)
Albumin: 2.8 g/dL — ABNORMAL LOW (ref 3.5–5.0)
Alkaline Phosphatase: 64 U/L (ref 38–126)
Anion gap: 12 (ref 5–15)
BUN: 93 mg/dL — ABNORMAL HIGH (ref 8–23)
CO2: 18 mmol/L — ABNORMAL LOW (ref 22–32)
Calcium: 8.6 mg/dL — ABNORMAL LOW (ref 8.9–10.3)
Chloride: 108 mmol/L (ref 98–111)
Creatinine, Ser: 2.23 mg/dL — ABNORMAL HIGH (ref 0.44–1.00)
GFR calc Af Amer: 24 mL/min — ABNORMAL LOW (ref >60)
GFR calc non Af Amer: 21 mL/min — ABNORMAL LOW (ref >60)
Glucose, Bld: 275 mg/dL — ABNORMAL HIGH (ref 70–99)
Potassium: 4 mmol/L (ref 3.5–5.1)
Sodium: 138 mmol/L (ref 135–145)
Total Bilirubin: 0.2 mg/dL — ABNORMAL LOW (ref 0.3–1.2)
Total Protein: 6.3 g/dL — ABNORMAL LOW (ref 6.5–8.1)

## 2019-02-20 LAB — CULTURE, BLOOD (ROUTINE X 2)
Culture: NO GROWTH
Culture: NO GROWTH
Special Requests: ADEQUATE
Special Requests: ADEQUATE

## 2019-02-20 LAB — MAGNESIUM: Magnesium: 1.9 mg/dL (ref 1.7–2.4)

## 2019-02-20 LAB — D-DIMER, QUANTITATIVE: D-Dimer, Quant: 1.51 ug/mL-FEU — ABNORMAL HIGH (ref 0.00–0.50)

## 2019-02-20 LAB — C-REACTIVE PROTEIN: CRP: 1.9 mg/dL — ABNORMAL HIGH (ref <1.0)

## 2019-02-20 LAB — BRAIN NATRIURETIC PEPTIDE: B Natriuretic Peptide: 106.8 pg/mL — ABNORMAL HIGH (ref 0.0–100.0)

## 2019-02-20 MED ORDER — FUROSEMIDE 20 MG PO TABS
80.0000 mg | ORAL_TABLET | Freq: Two times a day (BID) | ORAL | Status: DC
  Administered 2019-02-20 – 2019-02-21 (×2): 80 mg via ORAL
  Filled 2019-02-20 (×2): qty 4

## 2019-02-20 NOTE — Progress Notes (Signed)
Inpatient Diabetes Program Recommendations  AACE/ADA: New Consensus Statement on Inpatient Glycemic Control (2015)  Target Ranges:  Prepandial:   less than 140 mg/dL      Peak postprandial:   less than 180 mg/dL (1-2 hours)      Critically ill patients:  140 - 180 mg/dL   Lab Results  Component Value Date   GLUCAP 222 (H) 02/20/2019   HGBA1C 8.9 (H) 02/15/2019    Review of Glycemic Control Results for ELAINNA, ESHLEMAN (MRN [id number]) as of 02/20/2019 12:32  Ref. Range 02/19/2019 07:59 02/19/2019 12:10 02/19/2019 15:33 02/19/2019 20:52 02/20/2019 07:35 02/20/2019 11:51  Glucose-Capillary Latest Ref Range: 70 - 99 mg/dL 238 (H) 257 (H) 256 (H) 261 (H) 247 (H) 222 (H)   Diabetes history: DM 2 Outpatient Diabetes medications: Lantus 60 units qam, 70 units qpm, Novolog 10 units tid meal coverage Current orders for Inpatient glycemic control:  Lantus 35 units bid Novolog 0-15 units tid + hs Novolog 5 units tid meal coverage  Solumedrol 40 mg Daily  Inpatient Diabetes Program Recommendations:    If steroids remain at current dose. Consider increasing Lantus to 38 units bid.   Thanks,  Tama Headings RN, MSN, BC-ADM Inpatient Diabetes Coordinator Team Pager [phone] (8a-5p)

## 2019-02-20 NOTE — Plan of Care (Signed)
  Problem: Skin Integrity: Goal: Risk for impaired skin integrity will decrease Outcome: Progressing   

## 2019-02-20 NOTE — Progress Notes (Addendum)
PROGRESS NOTE    [name] [name]  BMW:[id number] DOB: 11-May-1940 DOA: 02/15/2019 PCP: Nolene Ebbs, MD    Brief Narrative:  78 year old female who presented with weakness.  She does have significant past medical history for diastolic heart failure, COPD, chronic hypoxic respiratory failure, chronic kidney disease stage III, SLE, type 2 diabetes mellitus, hypertension, dyslipidemia and history of pulmonary embolism.  Contact for COVID-19, about 7 days prior to development symptoms.  Home health nurse following her febrile, EMS was called, her oximetry was 85% on room air.  He was transported to the hospital where her blood pressure was 145/66, heart rate 109, respiratory 27, temperature 101.4, oximetry 85% on room air, up to 100% on 2 L of submental oxygen per nasal cannula.  Her lungs had rhonchi bilaterally, she was tachycardic, abdomen soft nontender, no lower extremity edema, patient was somnolent.  Sodium 136, potassium 3.7, chloride 106, bicarb 19, glucose 143, BUN 57, creatinine 2.63, white count 5.4, hemoglobin 10.6, hematocrit 34.8, platelets 204., her chest x-ray had a left base interstitial infiltrate.  EKG had 114 bpm, left axis deviation, right bundle branch block, sinus rhythm, no ST segment or T wave changes.  Patient was admitted to the hospital with working diagnosis of SARS COVID-19 viral pneumonia.  Patient has been treated with systemic corticosteroids and remdesivir with improvement of her symptoms.  Her hospitalization has been complicated for acute on chronic diastolic heart failure, hyperglycemia and acute kidney injury.    Assessment & Plan:   Principal Problem:   Acute respiratory disease due to COVID-19 virus Active Problems:   Chronic diastolic CHF (congestive heart failure) (HCC)   COPD (chronic obstructive pulmonary disease) (HCC)   Diabetes mellitus (HCC)   Hypertension associated with diabetes ([place])   Hyperlipidemia associated with type 2 diabetes mellitus  ([place])   Systemic lupus erythematosus ([place])   Acute on chronic respiratory failure with hypoxia ([place])   Acute-on-chronic kidney injury ([place])   Sepsis ([place])  1. Acute on chronic hypoxic respiratory failure due to SARS COVID 19 viral pneumonia. Patient with improved dyspnea, no chest pain or significant cough.   RR: 20  Pulse oxymetry: 96%  Fi02: 28% on 2 LPM per [place].  COVID-19 Labs  Recent Labs    02/18/19 0240 02/19/19 0105 02/20/19 0050  DDIMER 2.14* 1.86* 1.51*  CRP 5.0* 2.5* 1.9*    Inflammatory markers are improving.   Patient is tolerating well medical therapy with Remdesivir #5/5. Will continue with systemic steroids for one more day, with methylprednisolone 40 mg daily. Continue dulera, albuterol, ipratropium, for bronchodilator therapy. Airway clearing techniques with flutter valve and incentive spirometer.   2. AKI on CKD stage IV, with metabolic acidosis. Renal function stable with serum cr at 2,23 with K at 4,0 and serum bicarbonate at 18. Urine output over last 24 H is 1,300 ml. Edema has improved. Will resume home dose furosemide 80 mg po bid.  3. Acute on chronic diastolic heart failure. EF 55 to 60%. Today with improved volume status, will continue blood pressure control and will resume home dose of furosemide 80 mg po bid.   4. HTN. Continue blood pressure control with diltiazem, hydralazine, isosorbide and metoprolol. Holding on ace inh due to aki.   5. T2DM with dyslipidemia. Will continue glucose cover and monitoring with insulin sliding scale and basal insulin 35 units bid/ pre-meal coverage with 5 units. Patient is tolerating po well. Continue statin therapy.   6. SLE. No signs of acute flare, will continue  with hydroxychloroquine.    DVT prophylaxis: enoxaparin   Code Status:  dnr  Family Communication: no family at the bedside  Disposition Plan/ discharge barriers: Patient has declined SNF will plan to dc home in am.   Body mass index is 35.95 kg/m.  Malnutrition Type:      Malnutrition Characteristics:      Nutrition Interventions:     RN Pressure Injury Documentation:     Consultants:     Procedures:     Antimicrobials:       Subjective: Patient is feeling better, her dyspnea continue to improve, no nausea or vomiting, no chest pain.   Objective: Vitals:   02/19/19 1625 02/19/19 1915 02/20/19 0450 02/20/19 0737  BP: (!) 136/58 (!) 121/57 131/79 (!) 146/69  Pulse: (!) 58 62 (!) 54 70  Resp: 19 20 20  (!) 23  Temp: 97.7 F (36.5 C) 98 F (36.7 C) 98.1 F (36.7 C) (!) 97.5 F (36.4 C)  TempSrc: Axillary Oral Oral Oral  SpO2: 99% 95% 93% 93%  Weight:      Height:        Intake/Output Summary (Last 24 hours) at 02/20/2019 0824 Last data filed at 02/19/2019 1915 Gross per 24 hour  Intake -  Output 1300 ml  Net -1300 ml   Filed Weights   02/15/19 2204 02/17/19 0502 02/19/19 0500  Weight: 100.7 kg 95.3 kg 95 kg    Examination:   General: Not in pain or dyspnea, but deconditioned.  Neurology: Awake and alert, non focal  E ENT: mild pallor, no icterus, oral mucosa moist Cardiovascular: No JVD. S1-S2 present, rhythmic, no gallops, rubs, or murmurs. No lower extremity edema. Pulmonary: positive breath sounds bilaterally. Gastrointestinal. Abdomen with no organomegaly, non tender, no rebound or guarding Skin. No rashes Musculoskeletal: no joint deformities     Data Reviewed: I have personally reviewed following labs and imaging studies  CBC: Recent Labs  Lab 02/15/19 2246 02/16/19 1418 02/17/19 0502 02/18/19 0240 02/19/19 0105 02/20/19 0050  WBC 4.8 4.1 2.7* 5.3 8.0 9.5  NEUTROABS 3.3  --  2.2 4.7 7.4 8.7*  HGB 10.6* 10.0* 10.0* 10.2* 10.6* 11.5*  HCT 34.7* 33.7* 32.4* 32.1* 33.4* 35.9*  MCV 94.0 98.5 92.8 91.2 91.0 89.3  PLT 178 141* 165 201 235 810   Basic Metabolic Panel: Recent Labs  Lab 02/15/19 2246 02/16/19 1418 02/17/19 0502 02/18/19 0240 02/19/19 0105 02/20/19 0050   NA 135 134* 134* 133* 134* 138  K 3.7 4.0 4.5 4.5 4.3 4.0  CL 105 107 107 105 108 108  CO2 20* 18* 17* 16* 17* 18*  GLUCOSE 119* 135* 254* 467* 221* 275*  BUN 60* 59* 68* 84* 91* 93*  CREATININE 2.74* 2.52* 2.32* 2.29* 2.21* 2.23*  CALCIUM 8.8* 8.6* 8.2* 8.3* 8.3* 8.6*  MG 1.5*  1.4*  --  2.0 2.1 2.0 1.9  PHOS 4.2  --  4.7*  --   --   --    GFR: Estimated Creatinine Clearance: 23.6 mL/min (A) (by C-G formula based on SCr of 2.23 mg/dL (H)). Liver Function Tests: Recent Labs  Lab 02/16/19 1418 02/17/19 0502 02/18/19 0240 02/19/19 0105 02/20/19 0050  AST 55* 38 43* 34 60*  ALT 31 26 29 26  37  ALKPHOS 58 56 60 56 64  BILITOT 0.5 0.1* 0.6 0.7 0.2*  PROT 6.7 6.2* 6.2* 6.1* 6.3*  ALBUMIN 3.0* 2.7* 2.7* 2.8* 2.8*   No results for input(s): LIPASE, AMYLASE in the last 168 hours. No  results for input(s): AMMONIA in the last 168 hours. Coagulation Profile: No results for input(s): INR, PROTIME in the last 168 hours. Cardiac Enzymes: No results for input(s): CKTOTAL, CKMB, CKMBINDEX, TROPONINI in the last 168 hours. BNP (last 3 results) Recent Labs    10/31/18 1528  PROBNP 478   HbA1C: No results for input(s): HGBA1C in the last 72 hours. CBG: Recent Labs  Lab 02/19/19 0759 02/19/19 1210 02/19/19 1533 02/19/19 2052 02/20/19 0735  GLUCAP 238* 257* 256* 261* 247*   Lipid Profile: No results for input(s): CHOL, HDL, LDLCALC, TRIG, CHOLHDL, LDLDIRECT in the last 72 hours. Thyroid Function Tests: No results for input(s): TSH, T4TOTAL, FREET4, T3FREE, THYROIDAB in the last 72 hours. Anemia Panel: No results for input(s): VITAMINB12, FOLATE, FERRITIN, TIBC, IRON, RETICCTPCT in the last 72 hours.    Radiology Studies: I have reviewed all of the imaging during this hospital visit personally     Scheduled Meds: . allopurinol  100 mg Oral Daily  . aspirin  325 mg Oral Daily  . brimonidine  1 drop Both Eyes BID  . calcium carbonate  1,250 mg Oral QAC breakfast  .  cholecalciferol  1,000 Units Oral Daily  . citalopram  10 mg Oral Daily  . clobetasol ointment  1 application Topical BID  . colchicine  0.3 mg Oral Daily  . diltiazem  300 mg Oral Daily  . furosemide  40 mg Intravenous BID  . gabapentin  300 mg Oral BID  . heparin  7,500 Units Subcutaneous Q8H  . hydrALAZINE  10 mg Oral TID  . hydroxychloroquine  200 mg Oral Daily  . insulin aspart  0-15 Units Subcutaneous TID WC  . insulin aspart  0-5 Units Subcutaneous QHS  . insulin aspart  5 Units Subcutaneous TID WC  . insulin glargine  35 Units Subcutaneous BID  . Ipratropium-Albuterol  1 puff Inhalation Q6H  . isosorbide mononitrate  15 mg Oral Daily  . loratadine  10 mg Oral Daily  . methylPREDNISolone (SOLU-MEDROL) injection  40 mg Intravenous Daily  . metoprolol tartrate  25 mg Oral BID  . mometasone-formoterol  2 puff Inhalation BID  . oxybutynin  10 mg Oral Daily  . pantoprazole  40 mg Oral Daily  . pravastatin  40 mg Oral QHS  . sodium bicarbonate  650 mg Oral BID  . vitamin C  500 mg Oral Daily  . zinc sulfate  220 mg Oral Daily   Continuous Infusions: . remdesivir 100 mg in NS 250 mL 100 mg (02/19/19 1634)     LOS: 5 days        Angela Vazguez Gerome Apley, MD

## 2019-02-21 DIAGNOSIS — F339 Major depressive disorder, recurrent, unspecified: Secondary | ICD-10-CM | POA: Diagnosis not present

## 2019-02-21 DIAGNOSIS — J449 Chronic obstructive pulmonary disease, unspecified: Secondary | ICD-10-CM | POA: Diagnosis not present

## 2019-02-21 DIAGNOSIS — M6281 Muscle weakness (generalized): Secondary | ICD-10-CM | POA: Diagnosis not present

## 2019-02-21 DIAGNOSIS — N184 Chronic kidney disease, stage 4 (severe): Secondary | ICD-10-CM | POA: Diagnosis not present

## 2019-02-21 DIAGNOSIS — J9621 Acute and chronic respiratory failure with hypoxia: Secondary | ICD-10-CM | POA: Diagnosis not present

## 2019-02-21 DIAGNOSIS — I5033 Acute on chronic diastolic (congestive) heart failure: Secondary | ICD-10-CM | POA: Diagnosis not present

## 2019-02-21 DIAGNOSIS — L8990 Pressure ulcer of unspecified site, unspecified stage: Secondary | ICD-10-CM | POA: Insufficient documentation

## 2019-02-21 DIAGNOSIS — N179 Acute kidney failure, unspecified: Secondary | ICD-10-CM | POA: Diagnosis not present

## 2019-02-21 DIAGNOSIS — R262 Difficulty in walking, not elsewhere classified: Secondary | ICD-10-CM | POA: Diagnosis not present

## 2019-02-21 DIAGNOSIS — E119 Type 2 diabetes mellitus without complications: Secondary | ICD-10-CM | POA: Diagnosis not present

## 2019-02-21 DIAGNOSIS — I10 Essential (primary) hypertension: Secondary | ICD-10-CM | POA: Diagnosis not present

## 2019-02-21 DIAGNOSIS — M329 Systemic lupus erythematosus, unspecified: Secondary | ICD-10-CM | POA: Diagnosis not present

## 2019-02-21 DIAGNOSIS — E785 Hyperlipidemia, unspecified: Secondary | ICD-10-CM | POA: Diagnosis not present

## 2019-02-21 DIAGNOSIS — R2681 Unsteadiness on feet: Secondary | ICD-10-CM | POA: Diagnosis not present

## 2019-02-21 DIAGNOSIS — E1159 Type 2 diabetes mellitus with other circulatory complications: Secondary | ICD-10-CM | POA: Diagnosis not present

## 2019-02-21 DIAGNOSIS — U071 COVID-19: Secondary | ICD-10-CM | POA: Diagnosis not present

## 2019-02-21 LAB — COMPREHENSIVE METABOLIC PANEL
ALT: 35 U/L (ref 0–44)
AST: 32 U/L (ref 15–41)
Albumin: 2.9 g/dL — ABNORMAL LOW (ref 3.5–5.0)
Alkaline Phosphatase: 58 U/L (ref 38–126)
Anion gap: 12 (ref 5–15)
BUN: 99 mg/dL — ABNORMAL HIGH (ref 8–23)
CO2: 20 mmol/L — ABNORMAL LOW (ref 22–32)
Calcium: 9.2 mg/dL (ref 8.9–10.3)
Chloride: 108 mmol/L (ref 98–111)
Creatinine, Ser: 2.16 mg/dL — ABNORMAL HIGH (ref 0.44–1.00)
GFR calc Af Amer: 25 mL/min — ABNORMAL LOW (ref >60)
GFR calc non Af Amer: 21 mL/min — ABNORMAL LOW (ref >60)
Glucose, Bld: 133 mg/dL — ABNORMAL HIGH (ref 70–99)
Potassium: 3.9 mmol/L (ref 3.5–5.1)
Sodium: 140 mmol/L (ref 135–145)
Total Bilirubin: 0.3 mg/dL (ref 0.3–1.2)
Total Protein: 6.3 g/dL — ABNORMAL LOW (ref 6.5–8.1)

## 2019-02-21 LAB — GLUCOSE, CAPILLARY: Glucose-Capillary: 133 mg/dL — ABNORMAL HIGH (ref 70–99)

## 2019-02-21 MED ORDER — FLUTICASONE-SALMETEROL 500-50 MCG/DOSE IN AEPB: 1.0000 | INHALATION_SPRAY | Freq: Two times a day (BID) | RESPIRATORY_TRACT | 0 refills | Status: DC

## 2019-02-21 MED ORDER — INSULIN GLARGINE 100 UNIT/ML ~~LOC~~ SOLN: 35.0000 [IU] | Freq: Two times a day (BID) | SUBCUTANEOUS | 0 refills | Status: DC

## 2019-02-21 MED ORDER — COLCHICINE 0.6 MG PO TABS: 0.3000 mg | ORAL_TABLET | Freq: Every day | ORAL | 0 refills | Status: DC

## 2019-02-21 MED ORDER — TRAMADOL HCL 50 MG PO TABS: 50.0000 mg | ORAL_TABLET | Freq: Four times a day (QID) | ORAL | 0 refills | Status: DC | PRN

## 2019-02-21 NOTE — TOC Transition Note (Addendum)
Transition of Care [facility]) - CM/SW Discharge Note   Patient Details  Name: [name] [name] MRN: [id number] Date of Birth: 09/14/1940  Transition of Care [facility]) CM/SW Contact:  Ninfa Meeker, RN Phone Number: 02/21/2019, 3:31 PM   Clinical Narrative:   Patient will DC to: [facility] in [place] date: 02/21/19 Family notified: Sister in law: Darra Lis (unable to reach) Transport by: PTAR 4:15pm  Patient is ready for discharge per MD.Charge nurse, Bedside RN, patient and facility notified of discharge plan. Patient's sister-in-law did not answer the phone and unable to leave message. Case manager called patient's room with no answer. Asked Nurse to update her on transportation.  Bedside RN to call report to [phone]. Patient going to room 219B. Case Manager sent DC summary, FL2 to facility via the [facility]. Medical Necessity and face sheet printed to the unit, signed DNR to be completed. Ambulance transport has been requested for patient.         Final next level of care: Skilled Nursing Facility Barriers to Discharge: No Barriers Identified   Patient Goals and CMS Choice Patient states their goals for this hospitalization and ongoing recovery are:: to get stronger CMS Medicare.gov Compare Post Acute Care list provided to:: Patient Choice offered to / list presented to : Patient(choices offered via telephone)  Discharge Placement                       Discharge Plan and Services   Discharge Planning Services: CM Consult            DME Arranged: N/A DME Agency: NA       HH Arranged: NA HH Agency: NA        Social Determinants of Health (SDOH) Interventions     Readmission Risk Interventions No flowsheet data found.

## 2019-02-21 NOTE — [place] FL2 (Signed)
[place] MEDICAID FL2 LEVEL OF CARE SCREENING TOOL     IDENTIFICATION  Patient Name: [name] [name] Birthdate: [birth year] Sex: female Admission Date (Current Location): 02/15/2019  [facility] and [place] Number:  Herbalist and Address:  [facility] [place], [place] [address] [place], [place] [address]([address]      Provider Number: [id number]  Attending Physician Name and Address:  Tawni Millers,*  Relative Name and Phone Number:  Sister-in-law: Darra Lis  [phone]    Current Level of Care: Hospital Recommended Level of Care: [place] Prior Approval Number:    Date Approved/Denied:   PASRR Number: [id number]  Discharge Plan: SNF    Current Diagnoses: Patient Active Problem List   Diagnosis Date Noted  . Acute respiratory disease due to COVID-19 virus 02/16/2019  . Acute on chronic respiratory failure with hypoxia ([place]) 02/15/2019  . Acute-on-chronic kidney injury ([place]) 02/15/2019  . Sepsis ([place]) 02/15/2019  . Obesity (BMI 30-39.9) 10/31/2018  . CRI (chronic renal insufficiency), stage 3 (moderate) 10/31/2018  . Iron deficiency anemia 04/10/2018  . 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 Abnormality of gait 08/05/2015  . Spinal stenosis of lumbar region 08/05/2015  . Peripheral neuropathy 08/05/2015  . Chest pain 12/28/2013  . Hyperlipidemia associated with type 2 diabetes mellitus ([place]) 04/05/2013  . Systemic lupus erythematosus ([place]) 04/05/2013  . Pulmonary embolism ([place]) 01/24/2012  . Chronic diastolic CHF (congestive heart failure) ([place]) 01/24/2012  . COPD (chronic obstructive pulmonary disease) ([place]) 01/24/2012  . Diabetes mellitus ([place]) 01/24/2012  . Hypertension associated with diabetes ([place]) 01/24/2012  . Leukocytosis 01/24/2012  . Bronchitis 01/24/2012  . Anemia 01/24/2012    Orientation RESPIRATION BLADDER Height & Weight     Self, Time, Situation, Place  O2(1 Liter)  Incontinent Weight: 95 kg Height:  '5\' 4"'$  (162.6 cm)  BEHAVIORAL SYMPTOMS/MOOD NEUROLOGICAL BOWEL NUTRITION STATUS      Incontinent Diet  AMBULATORY STATUS COMMUNICATION OF NEEDS Skin   Limited Assist Verbally Normal, PU Stage and Appropriate Care                       Personal Care Assistance Level of Assistance  Dressing, Bathing Bathing Assistance: Maximum assistance   Dressing Assistance: Maximum assistance     Functional Limitations Info             SPECIAL CARE FACTORS FREQUENCY  PT (By licensed PT), OT (By licensed OT)     PT Frequency: 5x/week OT Frequency: min 3x/week            Contractures Contractures Info: Not present    Additional Factors Info  Code Status, Allergies, Isolation Precautions Code Status Info: DNR Allergies Info: Penicilliun-swelling, Fentanyl-itching, Peaches-Hives, Shelfish-hives     Isolation Precautions Info: Airborne - contact     Current Medications (02/21/2019):  This is the current hospital active medication list Current Facility-Administered Medications  Medication Dose Route Frequency Provider Last Rate Last Dose  . acetaminophen (TYLENOL) tablet 650 mg  650 mg Oral Q6H PRN Lenore Cordia, MD   650 mg at 02/17/19 0843  . allopurinol (ZYLOPRIM) tablet 100 mg  100 mg Oral Daily Eugenie Filler, MD   100 mg at 02/21/19 0950  . aspirin tablet 325 mg  325 mg Oral Daily Eugenie Filler, MD   325 mg at 02/21/19 1696  . brimonidine (ALPHAGAN) 0.15 % ophthalmic solution 1 drop  1 drop Both Eyes BID Irine Seal  V, MD   1 drop at 02/21/19 0955  . calcium carbonate (OS-CAL - dosed in mg of elemental calcium) tablet 1,250 mg  1,250 mg Oral QAC breakfast Eugenie Filler, MD   1,250 mg at 02/21/19 0953  . cholecalciferol (VITAMIN D3) tablet 1,000 Units  1,000 Units Oral Daily Eugenie Filler, MD   1,000 Units at 02/21/19 920 050 1510  . citalopram (CELEXA) tablet 10 mg  10 mg Oral Daily Eugenie Filler, MD   10 mg at 02/21/19  [phone]  . clobetasol ointment (TEMOVATE) 2.56 % 1 application  1 application Topical BID Eugenie Filler, MD   1 application at 38/93/73 [phone]  . colchicine tablet 0.3 mg  0.3 mg Oral Daily Thurnell Lose, MD   0.3 mg at 02/21/19 0957  . diltiazem (CARDIZEM CD) 24 hr capsule 300 mg  300 mg Oral Daily Thurnell Lose, MD   300 mg at 02/21/19 0951  . furosemide (LASIX) tablet 80 mg  80 mg Oral BID Tawni Millers, MD   80 mg at 02/21/19 0950  . gabapentin (NEURONTIN) capsule 300 mg  300 mg Oral BID Eugenie Filler, MD   300 mg at 02/21/19 0950  . heparin injection 7,500 Units  7,500 Units Subcutaneous Q8H Eugenie Filler, MD   7,500 Units at 02/21/19 [phone]  . hydrALAZINE (APRESOLINE) tablet 10 mg  10 mg Oral TID Thurnell Lose, MD   10 mg at 02/21/19 0954  . hydroxychloroquine (PLAQUENIL) tablet 200 mg  200 mg Oral Daily Zada Finders R, MD   200 mg at 02/21/19 0953  . insulin aspart (novoLOG) injection 0-15 Units  0-15 Units Subcutaneous TID WC Thurnell Lose, MD   2 Units at 02/21/19 [phone]  . insulin aspart (novoLOG) injection 0-5 Units  0-5 Units Subcutaneous QHS Thurnell Lose, MD   3 Units at 02/19/19 2100  . insulin aspart (novoLOG) injection 5 Units  5 Units Subcutaneous TID WC Thurnell Lose, MD   5 Units at 02/21/19 0959  . insulin glargine (LANTUS) injection 35 Units  35 Units Subcutaneous BID Thurnell Lose, MD   35 Units at 02/21/19 0954  . Ipratropium-Albuterol (COMBIVENT) respimat 1 puff  1 puff Inhalation Q6H Lenore Cordia, MD   1 puff at 02/21/19 0955  . isosorbide mononitrate (IMDUR) 24 hr tablet 15 mg  15 mg Oral Daily Thurnell Lose, MD   15 mg at 02/20/19 1004  . levalbuterol (XOPENEX HFA) inhaler 2 puff  2 puff Inhalation Q2H PRN Eugenie Filler, MD   2 puff at 02/21/19 [phone]  . loratadine (CLARITIN) tablet 10 mg  10 mg Oral Daily Eugenie Filler, MD   10 mg at 02/21/19 0951  . methylPREDNISolone sodium succinate (SOLU-MEDROL) 40 mg/mL  injection 40 mg  40 mg Intravenous Daily Thurnell Lose, MD   40 mg at 02/21/19 0954  . metoprolol tartrate (LOPRESSOR) tablet 25 mg  25 mg Oral BID Thurnell Lose, MD   25 mg at 02/21/19 0950  . mometasone-formoterol (DULERA) 200-5 MCG/ACT inhaler 2 puff  2 puff Inhalation BID Eugenie Filler, MD   2 puff at 02/21/19 [phone]  . oxybutynin (DITROPAN-XL) 24 hr tablet 10 mg  10 mg Oral Daily Eugenie Filler, MD   10 mg at 02/21/19 0954  . pantoprazole (PROTONIX) EC tablet 40 mg  40 mg Oral Daily Eugenie Filler, MD   40 mg at 02/21/19 0950  . pravastatin (  PRAVACHOL) tablet 40 mg  40 mg Oral QHS Lenore Cordia, MD   40 mg at 02/20/19 2042  . senna-docusate (Senokot-S) tablet 1 tablet  1 tablet Oral QHS PRN Lenore Cordia, MD   1 tablet at 02/16/19 1227  . sodium bicarbonate tablet 650 mg  650 mg Oral BID Eugenie Filler, MD   650 mg at 02/21/19 0953  . vitamin C (ASCORBIC ACID) tablet 500 mg  500 mg Oral Daily Zada Finders R, MD   500 mg at 02/21/19 0951  . zinc sulfate capsule 220 mg  220 mg Oral Daily Lenore Cordia, MD   220 mg at 02/21/19 4174     Discharge Medications: Please see discharge summary for a list of discharge medications.  Relevant Imaging Results:  Relevant Lab Results:   Additional Information Pts. SSN: [id number]  Ninfa Meeker, RN

## 2019-02-21 NOTE — Discharge Instructions (Signed)
Person Under Monitoring Name: [name] [name]  Location: [place] [address]   Infection Prevention Recommendations for Individuals Confirmed to have, or Being Evaluated for, 2019 Novel Coronavirus (COVID-19) Infection Who Receive Care at Home  Individuals who are confirmed to have, or are being evaluated for, COVID-19 should follow the prevention steps below until a healthcare provider or local or state health department says they can return to normal activities.  Stay home except to get medical care You should restrict activities outside your home, except for getting medical care. Do not go to work, school, or public areas, and do not use public transportation or taxis.  Call ahead before visiting your doctor Before your medical appointment, call the healthcare provider and tell them that you have, or are being evaluated for, COVID-19 infection. This will help the healthcare providers office take steps to keep other people from getting infected. Ask your healthcare provider to call the local or state health department.  Monitor your symptoms Seek prompt medical attention if your illness is worsening (e.g., difficulty breathing). Before going to your medical appointment, call the healthcare provider and tell them that you have, or are being evaluated for, COVID-19 infection. Ask your healthcare provider to call the local or state health department.  Wear a facemask You should wear a facemask that covers your nose and mouth when you are in the same room with other people and when you visit a healthcare provider. People who live with or visit you should also wear a facemask while they are in the same room with you.  Separate yourself from other people in your home As much as possible, you should stay in a different room from other people in your home. Also, you should use a separate bathroom, if available.  Avoid sharing household items You should not  share dishes, drinking glasses, cups, eating utensils, towels, bedding, or other items with other people in your home. After using these items, you should wash them thoroughly with soap and water.  Cover your coughs and sneezes Cover your mouth and nose with a tissue when you cough or sneeze, or you can cough or sneeze into your sleeve. Throw used tissues in a lined trash can, and immediately wash your hands with soap and water for at least 20 seconds or use an alcohol-based hand rub.  Wash your [facility] your hands often and thoroughly with soap and water for at least 20 seconds. You can use an alcohol-based hand sanitizer if soap and water are not available and if your hands are not visibly dirty. Avoid touching your eyes, nose, and mouth with unwashed hands.   Prevention Steps for Caregivers and Household Members of Individuals Confirmed to have, or Being Evaluated for, COVID-19 Infection Being Cared for in the Home  If you live with, or provide care at home for, a person confirmed to have, or being evaluated for, COVID-19 infection please follow these guidelines to prevent infection:  Follow healthcare providers instructions Make sure that you understand and can help the patient follow any healthcare provider instructions for all care.  Provide for the patients basic needs You should help the patient with basic needs in the home and provide support for getting groceries, prescriptions, and other personal needs.  Monitor the patients symptoms If they are getting sicker, call his or her medical provider and tell them that the patient has, or is being evaluated for, COVID-19 infection. This will help the healthcare  providers office take steps to keep other people from getting infected. Ask the healthcare provider to call the local or state health department.  Limit the number of people who have contact with the patient  If possible, have only one caregiver for the  patient.  Other household members should stay in another home or place of residence. If this is not possible, they should stay  in another room, or be separated from the patient as much as possible. Use a separate bathroom, if available.  Restrict visitors who do not have an essential need to be in the home.  Keep older adults, very young children, and other sick people away from the patient Keep older adults, very young children, and those who have compromised immune systems or chronic health conditions away from the patient. This includes people with chronic heart, lung, or kidney conditions, diabetes, and cancer.  Ensure good ventilation Make sure that shared spaces in the home have good air flow, such as from an air conditioner or an opened window, weather permitting.  Wash your hands often  Wash your hands often and thoroughly with soap and water for at least 20 seconds. You can use an alcohol based hand sanitizer if soap and water are not available and if your hands are not visibly dirty.  Avoid touching your eyes, nose, and mouth with unwashed hands.  Use disposable paper towels to dry your hands. If not available, use dedicated cloth towels and replace them when they become wet.  Wear a facemask and gloves  Wear a disposable facemask at all times in the room and gloves when you touch or have contact with the patients blood, body fluids, and/or secretions or excretions, such as sweat, saliva, sputum, nasal mucus, vomit, urine, or feces.  Ensure the mask fits over your nose and mouth tightly, and do not touch it during use.  Throw out disposable facemasks and gloves after using them. Do not reuse.  Wash your hands immediately after removing your facemask and gloves.  If your personal clothing becomes contaminated, carefully remove clothing and launder. Wash your hands after handling contaminated clothing.  Place all used disposable facemasks, gloves, and other waste in a lined  container before disposing them with other household waste.  Remove gloves and wash your hands immediately after handling these items.  Do not share dishes, glasses, or other household items with the patient  Avoid sharing household items. You should not share dishes, drinking glasses, cups, eating utensils, towels, bedding, or other items with a patient who is confirmed to have, or being evaluated for, COVID-19 infection.  After the person uses these items, you should wash them thoroughly with soap and water.  Wash laundry thoroughly  Immediately remove and wash clothes or bedding that have blood, body fluids, and/or secretions or excretions, such as sweat, saliva, sputum, nasal mucus, vomit, urine, or feces, on them.  Wear gloves when handling laundry from the patient.  Read and follow directions on labels of laundry or clothing items and detergent. In general, wash and dry with the warmest temperatures recommended on the label.  Clean all areas the individual has used often  Clean all touchable surfaces, such as counters, tabletops, doorknobs, bathroom fixtures, toilets, phones, keyboards, tablets, and bedside tables, every day. Also, clean any surfaces that may have blood, body fluids, and/or secretions or excretions on them.  Wear gloves when cleaning surfaces the patient has come in contact with.  Use a diluted bleach solution (e.g., dilute bleach with  1 part bleach and 10 parts water) or a household disinfectant with a label that says EPA-registered for coronaviruses. To make a bleach solution at home, add 1 tablespoon of bleach to 1 quart (4 cups) of water. For a larger supply, add  cup of bleach to 1 gallon (16 cups) of water.  Read labels of cleaning products and follow recommendations provided on product labels. Labels contain instructions for safe and effective use of the cleaning product including precautions you should take when applying the product, such as wearing gloves or  eye protection and making sure you have good ventilation during use of the product.  Remove gloves and wash hands immediately after cleaning.  Monitor yourself for signs and symptoms of illness Caregivers and household members are considered close contacts, should monitor their health, and will be asked to limit movement outside of the home to the extent possible. Follow the monitoring steps for close contacts listed on the symptom monitoring form.   ? If you have additional questions, contact your local health department or call the epidemiologist on call at [phone] (available 24/7). ? This guidance is subject to change. For the most up-to-date guidance from [facility], please refer to their website: [email or web address]    Lab Results  Component Value Date   [facility] (A) 02/15/2019

## 2019-02-21 NOTE — Progress Notes (Signed)
Report given to facility.

## 2019-02-21 NOTE — Progress Notes (Signed)
Physical Therapy Treatment Patient Details Name: [name] [name] MRN: [id number] DOB: [birth year] Today's Date: 02/21/2019    History of Present Illness [name] [name] is a 78 y.o. 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    PT Comments    Slightly better tolerance to tx today, was able to get to EOB with max a x 1, able to sit EOB x approx 37mins needs cues and continue repositioning but completed task well. Able to complete sit<>stand at EOB with RW and max a  X 1 and also maintain stance x approx 3-32mins with max cues and max a.     Follow Up Recommendations  SNF;Supervision/Assistance - 24 hour     Equipment Recommendations  None recommended by PT    Recommendations for Other Services       Precautions / Restrictions Precautions Precautions: Fall Restrictions Weight Bearing Restrictions: No    Mobility  Bed Mobility Overal bed mobility: Needs Assistance Bed Mobility: Rolling;Supine to Sit;Sit to Supine Rolling: Mod assist   Supine to sit: Max assist Sit to supine: Max assist   General bed mobility comments: multimodal cues   , extra time to initiate , required assist with legs and trunk, and scooting to bed edge  Transfers Overall transfer level: Needs assistance Equipment used: Rolling walker (2 wheeled) Transfers: Sit to/from Stand Sit to Stand: Max assist         General transfer comment: able to stand EOB with max a x1 today and maintains stance x 1mins max   Ambulation/Gait                 Stairs             Wheelchair Mobility    Modified Rankin (Stroke Patients Only)       Balance Overall balance assessment: Needs assistance Sitting-balance support: Feet  unsupported;Single extremity supported Sitting balance-Leahy Scale: Poor Sitting balance - Comments: falling backwards needig continued repositioning Postural control: Posterior lean Standing balance support: Bilateral upper extremity supported Standing balance-Leahy Scale: Poor Standing balance comment: able to stand with walker and max but needs max a to maintain stance and max cues                            Cognition Arousal/Alertness: Awake/alert Behavior During Therapy: Flat affect Overall Cognitive Status: No family/caregiver present to determine baseline cognitive functioning                                 General Comments: seemed confused at first stating she was going home and had assist 8-5 daily 5x week, nurse reports had agreed to SNF with physician earlier      Exercises      General Comments        Pertinent Vitals/Pain Pain Assessment: Faces Faces Pain Scale: Hurts little more Pain Location: w/ mobility and assist with cleaning up Pain Descriptors / Indicators: Discomfort;Grimacing;Guarding Pain Intervention(s): Limited activity within patient's tolerance;Monitored during session    Home Living  Prior Function            PT Goals (current goals can now be found in the care plan section) Acute Rehab PT Goals Patient Stated Goal: upon arrival was still stating she was going home, but nurse came in later with news had been accepted to SNF Time For Goal Achievement: 03/04/19 Potential to Achieve Goals: Fair Progress towards PT goals: Progressing toward goals    Frequency    Min 2X/week      PT Plan Current plan remains appropriate    Co-evaluation              AM-PAC PT "6 Clicks" Mobility   Outcome Measure  Help needed turning from your back to your side while in a flat bed without using bedrails?: A Lot Help needed moving from lying on your back to sitting on the side of a flat bed  without using bedrails?: A Lot Help needed moving to and from a bed to a chair (including a wheelchair)?: A Lot Help needed standing up from a chair using your arms (e.g., wheelchair or bedside chair)?: A Lot Help needed to walk in hospital room?: Total Help needed climbing 3-5 steps with a railing? : Total 6 Click Score: 10    End of Session Equipment Utilized During Treatment: Gait belt Activity Tolerance: Patient limited by lethargy;Patient limited by fatigue Patient left: in bed;with nursing/sitter in room Nurse Communication: Mobility status PT Visit Diagnosis: Difficulty in walking, not elsewhere classified (R26.2);Unsteadiness on feet (R26.81)     Time: 7353-2992 PT Time Calculation (min) (ACUTE ONLY): 32 min  Charges:  $Therapeutic Activity: 23-37 mins                     Horald Chestnut, PT    Delford Field 02/21/2019, 4:20 PM

## 2019-02-21 NOTE — Discharge Summary (Addendum)
Physician Discharge Summary  [name] [name] EML:[id number] DOB: 08-05-1940 DOA: 02/15/2019  PCP: Nolene Ebbs, MD  Admit date: 02/15/2019 Discharge date: 02/21/2019  Admitted From: Home  Disposition:  SNF   Recommendations for Outpatient Follow-up and new medication changes:  1. Follow up with Dr. Jeanie Cooks in 2 weeks.  2. Insulin glargine has been decreased to prevent hypoglycemia, to 35 units bid.  3. Continue self quarantine for 2 weeks, use a mask in public and maintain physical distancing.  4. Patient has accepted SNF  5. Follow-up kidney function in 1 to 2 weeks. 6. Patient in the record is DNR, I was not able to reach her family over the phone.   Home Health: Yes   Equipment/Devices: Home 02     Discharge Condition: stable  CODE STATUS: DNR   Diet recommendation: Heart healthy and diabetic prudent.   Brief/Interim Summary: 78 year old female who presented with weakness.  She does have significant past medical history for diastolic heart failure, COPD, chronic hypoxic respiratory failure, chronic kidney disease stage III, SLE, type 2 diabetes mellitus, hypertension, dyslipidemia and history of pulmonary embolism.  She had a positive contact for COVID-19, about 7 days prior to development symptoms.  Home health nurse following found her febrile, EMS was called, and her oximetry was 85% on room air.  She was transported to the hospital where her blood pressure was 145/66, heart rate 109, respiratory rate 27, temperature 101.4, oximetry 85% on room air, up to 100% on 2 L of supplemental oxygen per nasal cannula.  Her lungs had rhonchi bilaterally, she was tachycardic, abdomen soft nontender, no lower extremity edema, patient was somnolent.  Sodium 136, potassium 3.7, chloride 106, bicarb 19, glucose 143, BUN 57, creatinine 2.63, white count 5.4, hemoglobin 10.6, hematocrit 34.8, platelets 204., her chest x-ray had a left base interstitial infiltrate.  EKG had 114 bpm, left axis deviation,  right bundle branch block, sinus rhythm, no ST segment or T wave changes.  Patient was admitted to the hospital with working diagnosis of SARS COVID-19 viral pneumonia, complicated with acute on chronic hypoxic respiratory failure.  Patient has been treated with systemic corticosteroids and remdesivir with improvement of her symptoms.  Her hospitalization has been complicated for acute on chronic diastolic heart failure, hyperglycemia and acute kidney injury.  1.  Acute on chronic hypoxic respiratory failure due to SARS COVID-19 viral pneumonia.  Patient was admitted to the medical ward, she received supplemental oxygen per nasal cannula, she was treated with remdesivir and dexamethasone.  Bronchodilator therapy with albuterol, ipratropium and airway clearance techniques with flutter valve and incentive spirometer.  Patient responded well to medical therapy and she will be discharged home to continue self quarantine for the next 2 weeks, use mask in public and maintain physical distancing.  Patient was seen by physical therapy and recommended skilled nursing facility she has declined this services and will be discharged home with home health care.   2.  Acute kidney injury chronic kidney disease stage IV (base creatinine 2.0, calculated GFR 27), non gap metabolic acidosis.  Her peak creatinine reached 2.7, she was found hypervolemic and underwent diuresis with IV furosemide, her metabolic acidosis was non-anion gap and received sodium bicarb supplements.  She responded well with improvement of her kidney function and acid-base disorder, her discharge creatinine is 2.16, serum bicarb 20, potassium 3.9 and sodium 140.  Patient will continue diuresis with oral furosemide.  3.  Acute on chronic diastolic heart failure.  Ejection fraction left ventricle 55  to 60%.  Patient was diuresed with IV furosemide with improvement of volume status.  Continue blood pressure control and oral furosemide at  discharge.  4.  Hypertension.  Her discharge blood pressure is 121/51, 154/80 will continue blood pressure control with diltiazem, hydralazine, isosorbide and metoprolol.  At discharge will resume ACE inhibitor and hydrochlorothiazide.  5.  Type 2 diabetes mellitus with dyslipidemia.  Patient was placed on insulin sliding scale, premeal insulin coverage and basal insulin.  She required less insulin than her home regimen, despite on high-dose systemic corticosteroids, her discharge fasting glucose is 133.  Basal dose of insulin has been decreased to 35 units twice daily, continue titration at home.  6.  Systemic lupus erythematous.  No active flare, continue hydroxychloroquine.  Discharge Diagnoses:  Principal Problem:   Acute respiratory disease due to COVID-19 virus Active Problems:   Chronic diastolic CHF (congestive heart failure) (HCC)   COPD (chronic obstructive pulmonary disease) (HCC)   Diabetes mellitus (HCC)   Hypertension associated with diabetes ([place])   Hyperlipidemia associated with type 2 diabetes mellitus ([place])   Systemic lupus erythematosus (HCC)   Acute on chronic respiratory failure with hypoxia (HCC)   Acute-on-chronic kidney injury ([place])   Sepsis ([place])    Discharge Instructions   Allergies as of 02/21/2019      Reactions   Penicillins Swelling   Has patient had a PCN reaction causing immediate rash, facial/tongue/throat swelling, SOB or lightheadedness with hypotension: Yes Has patient had a PCN reaction causing severe rash involving mucus membranes or skin necrosis: Yes Has patient had a PCN reaction that required hospitalization Yes Has patient had a PCN reaction occurring within the last 10 years: No, more than 10 yrs ago If all of the above answers are "NO", then may proceed with Cephalosporin use.   Fentanyl Itching   Peach [prunus Persica] Hives   Shellfish Allergy Hives      Medication List    STOP taking these medications   budesonide 0.5 MG/2ML  nebulizer solution Commonly known as: PULMICORT   LORazepam 0.5 MG tablet Commonly known as: ATIVAN     TAKE these medications   acetaminophen 650 MG CR tablet Commonly known as: TYLENOL Take 1,300 mg by mouth every 8 (eight) hours as needed for pain.   Alaway 0.025 % ophthalmic solution Generic drug: ketotifen Place 1 drop into both eyes daily as needed (allergy symptoms).   albuterol 108 (90 Base) MCG/ACT inhaler Commonly known as: VENTOLIN HFA Inhale 2 puffs into the lungs See admin instructions. Every 6 hours as needed for SOB but also takes 2 puffs BID scheduled. What changed: Another medication with the same name was removed. Continue taking this medication, and follow the directions you see here.   alendronate 70 MG tablet Commonly known as: FOSAMAX Take 70 mg by mouth every Monday. Take with a full glass of water on an empty stomach.   allopurinol 100 MG tablet Commonly known as: ZYLOPRIM Take 100 mg by mouth daily.   aspirin 325 MG tablet Take 325 mg by mouth daily.   brimonidine 0.1 % Soln Commonly known as: ALPHAGAN P Place 1 drop into both eyes 2 (two) times a day.   calcium carbonate 1250 (500 Ca) MG chewable tablet Commonly known as: OS-CAL Chew 1 tablet by mouth daily.   Cartia XT 300 MG 24 hr capsule Generic drug: diltiazem Take 300 mg by mouth daily. Reported on 09/15/2015   cetirizine 10 MG tablet Commonly known as: ZYRTEC Take 10  mg by mouth daily.   cholecalciferol 1000 units tablet Commonly known as: VITAMIN D Take 1,000 Units by mouth daily.   citalopram 10 MG tablet Commonly known as: CELEXA Take 10 mg by mouth daily.   clobetasol ointment 0.05 % Commonly known as: TEMOVATE Apply 1 application topically 2 (two) times daily.   colchicine 0.6 MG tablet Commonly known as: Colcrys Take 0.5 tablets (0.3 mg total) by mouth daily. What changed:   how much to take  when to take this   desonide 0.05 % cream Commonly known as:  DESOWEN Apply 1 application topically 2 (two) times daily.   dexlansoprazole 60 MG capsule Commonly known as: DEXILANT Take 60 mg by mouth daily.   diclofenac sodium 1 % Gel Commonly known as: VOLTAREN Apply 2 g topically daily as needed (leg pain). Reported on 09/15/2015   diphenhydrAMINE 25 MG tablet Commonly known as: BENADRYL Take 25 mg by mouth every 6 (six) hours as needed for itching.   Fluticasone-Salmeterol 500-50 MCG/DOSE Aepb Commonly known as: Advair Diskus Inhale 1 puff into the lungs 2 (two) times daily.   furosemide 80 MG tablet Commonly known as: LASIX Take 1 tablet (80 mg total) by mouth 2 (two) times daily.   gabapentin 300 MG capsule Commonly known as: NEURONTIN Take 1 capsule (300 mg total) by mouth 2 (two) times daily.   hydrALAZINE 50 MG tablet Commonly known as: APRESOLINE Take 50 mg by mouth 3 (three) times daily.   hydroxychloroquine 200 MG tablet Commonly known as: PLAQUENIL Take 200 mg by mouth daily.   insulin aspart 100 UNIT/ML injection Commonly known as: novoLOG Inject 10 Units into the skin 3 (three) times daily before meals.   insulin glargine 100 UNIT/ML injection Commonly known as: LANTUS Inject 0.35 mLs (35 Units total) into the skin 2 (two) times daily. What changed:   how much to take  additional instructions   isosorbide mononitrate 30 MG 24 hr tablet Commonly known as: IMDUR Take 30 mg by mouth every morning.   lisinopril-hydrochlorothiazide 20-12.5 MG tablet Commonly known as: ZESTORETIC   metoprolol tartrate 50 MG tablet Commonly known as: LOPRESSOR Take 1 tablet by mouth twice daily   oxybutynin 10 MG 24 hr tablet Commonly known as: DITROPAN-XL Take 10 mg by mouth daily.   OXYGEN Inhale into the lungs. 2L/min - prn during day and every evening.   pravastatin 40 MG tablet Commonly known as: PRAVACHOL Take 40 mg by mouth at bedtime.   Symbicort 160-4.5 MCG/ACT inhaler Generic drug:  budesonide-formoterol Inhale 2 puffs into the lungs 2 (two) times daily. Reported on 09/15/2015   tizanidine 2 MG capsule Commonly known as: ZANAFLEX Take 2 mg by mouth 2 (two) times daily.   traMADol 50 MG tablet Commonly known as: ULTRAM Take 1 tablet (50 mg total) by mouth every 6 (six) hours as needed for severe pain.   trimethoprim 100 MG tablet Commonly known as: TRIMPEX Take 100 mg by mouth daily.   vitamin B-12 1000 MCG tablet Commonly known as: CYANOCOBALAMIN Take 1,000 mcg by mouth daily. Reported on 09/15/2015       Allergies  Allergen Reactions  . Penicillins Swelling    Has patient had a PCN reaction causing immediate rash, facial/tongue/throat swelling, SOB or lightheadedness with hypotension: Yes Has patient had a PCN reaction causing severe rash involving mucus membranes or skin necrosis: Yes Has patient had a PCN reaction that required hospitalization Yes Has patient had a PCN reaction occurring within the last 10  years: No, more than 10 yrs ago If all of the above answers are "NO", then may proceed with Cephalosporin use.   . Fentanyl Itching  . Peach [Prunus Persica] Hives  . Shellfish Allergy Hives    Consultations:     Procedures/Studies: Dg Chest Port 1 View  Result Date: 02/15/2019 CLINICAL DATA:  Viral pneumonia EXAM: PORTABLE CHEST 1 VIEW COMPARISON:  12/22/2014 FINDINGS: The heart size is enlarged. Aortic calcifications are noted. There are scattered bilateral ground-glass airspace opacities, greatest within the left lower lobe. There is no pneumothorax. No large pleural effusion. There are end-stage degenerative changes of the left glenohumeral joint. There is no acute osseous abnormality. IMPRESSION: Bilateral ground-glass airspace opacities which can be seen in patients with viral pneumonia. Electronically Signed   By: Constance Holster M.D.   On: 02/15/2019 19:25   Vas [place] Lower Extremity Venous (dvt)  Result Date: 01/25/2019  Lower Venous  Study Indications: Pain.  Risk Factors: Remote history. Performing Technologist: Ralene Cork RVT  Examination Guidelines: A complete evaluation includes B-mode imaging, spectral Doppler, color Doppler, and power Doppler as needed of all accessible portions of each vessel. Bilateral testing is considered an integral part of a complete examination. Limited examinations for reoccurring indications may be performed as noted.  +---------+---------------+---------+-----------+----------+--------------+ LEFT     CompressibilityPhasicitySpontaneityPropertiesThrombus Aging +---------+---------------+---------+-----------+----------+--------------+ CFV      Full           Yes      Yes                                 +---------+---------------+---------+-----------+----------+--------------+ SFJ      Full                    Yes                                 +---------+---------------+---------+-----------+----------+--------------+ FV Prox  Full           Yes      Yes                                 +---------+---------------+---------+-----------+----------+--------------+ FV Mid   Full           Yes      Yes                                 +---------+---------------+---------+-----------+----------+--------------+ FV DistalFull           Yes      Yes                                 +---------+---------------+---------+-----------+----------+--------------+ POP      Full           Yes      Yes                                 +---------+---------------+---------+-----------+----------+--------------+ PTV      Full                    Yes                                 +---------+---------------+---------+-----------+----------+--------------+  PERO     Full                                                        +---------+---------------+---------+-----------+----------+--------------+ GSV      Full           Yes      Yes                                  +---------+---------------+---------+-----------+----------+--------------+  Findings reported to Brynn at 3:25pm.  Summary: Left: There is no obvious evidence of deep vein thrombosis in the lower extremity.There is no evidence of superficial venous thrombosis. Veins are pulsatile.  *See table(s) above for measurements and observations. Electronically signed by Servando Snare MD on 01/25/2019 at 4:06:06 PM.    Final       Procedures:   Subjective: Patient is very weak and deconditioned, required significant assistance per nursing to bath and ear. No nausea or vomiting, and no dyspnea at rest.   Discharge Exam: Vitals:   02/21/19 0415 02/21/19 0717  BP: (!) 125/51 (!) 154/80  Pulse: 66 87  Resp: 16 (!) 21  Temp: 98 F (36.7 C) 97.8 F (36.6 C)  SpO2: 98% 93%   Vitals:   02/20/19 1917 02/20/19 2354 02/21/19 0415 02/21/19 0717  BP: 131/62 (!) 116/58 (!) 125/51 (!) 154/80  Pulse: 62 60 66 87  Resp: 16 15 16  (!) 21  Temp: 97.8 F (36.6 C) 98.1 F (36.7 C) 98 F (36.7 C) 97.8 F (36.6 C)  TempSrc: Oral Axillary Oral Axillary  SpO2: 97% 100% 98% 93%  Weight:      Height:        General: Not in pain or dyspnea.  Neurology: Awake and alert, non focal  E ENT: no pallor, no icterus, oral mucosa moist Cardiovascular: No JVD. S1-S2 present, rhythmic, no gallops, rubs, or murmurs. Trace lower extremity edema. Pulmonary: vesicular breath sounds bilaterally. Gastrointestinal. Abdomen with no organomegaly, non tender, no rebound or guarding Skin. No rashes Musculoskeletal: no joint deformities   The results of significant diagnostics from this hospitalization (including imaging, microbiology, ancillary and laboratory) are listed below for reference.     Microbiology: Recent Results (from the past 240 hour(s))  SARS CORONAVIRUS 2 (TAT 6-24 HRS) Nasopharyngeal Nasopharyngeal Swab     Status: Abnormal   Collection Time: 02/15/19  6:10 PM   Specimen: Nasopharyngeal Swab  Result Value  Ref Range Status   SARS Coronavirus 2 POSITIVE (A) NEGATIVE Final    Comment: RESULT CALLED TO, READ BACK BY AND VERIFIED WITH: B.CERIC RN 0124 02/16/2019 MCCORMICK K (NOTE) SARS-CoV-2 target nucleic acids are DETECTED. The SARS-CoV-2 RNA is generally detectable in upper and lower respiratory specimens during the acute phase of infection. Positive results are indicative of active infection with SARS-CoV-2. Clinical  correlation with patient history and other diagnostic information is necessary to determine patient infection status. Positive results do  not rule out bacterial infection or co-infection with other viruses. The expected result is Negative. Fact Sheet for Patients: SugarRoll.be Fact Sheet for Healthcare Providers: https://www.woods-mathews.com/ This test is not yet approved or cleared by the [place] FDA and  has been authorized for detection and/or diagnosis of SARS-CoV-2 by FDA under an Emergency Use Authorization (  EUA). This EUA will remain  in effect (meaning this test can be used) fo r the duration of the COVID-19 declaration under Section 564(b)(1) of the Act, 21 U.S.C. section 360bbb-3(b)(1), unless the authorization is terminated or revoked sooner. Performed at [facility], [place] [address], [place], [place] [address]   Blood Culture (routine x 2)     Status: None   Collection Time: 02/15/19  6:10 PM   Specimen: BLOOD LEFT FOREARM  Result Value Ref Range Status   Specimen Description   Final    BLOOD LEFT FOREARM Performed at [facility], [place] [address] [place], [place] [address]    Special Requests   Final    BOTTLES DRAWN AEROBIC AND ANAEROBIC Blood Culture adequate volume Performed at [facility], [place] [address], [place], [place] [address]    Culture   Final    NO GROWTH 5 DAYS Performed at [facility], [place] [address], [place], [place]  [address]    Report Status 02/20/2019 FINAL  Final  Blood Culture (routine x 2)     Status: None   Collection Time: 02/15/19  6:10 PM   Specimen: BLOOD  Result Value Ref Range Status   Specimen Description   Final    BLOOD RIGHT ANTECUBITAL Performed at [facility], [place] [address], [place], [place] [address]    Special Requests   Final    BOTTLES DRAWN AEROBIC AND ANAEROBIC Blood Culture adequate volume Performed at [facility], [place] [address], [place], [place] [address]    Culture   Final    NO GROWTH 5 DAYS Performed at [facility], [place] [address] [place], [place] [address]    Report Status 02/20/2019 FINAL  Final     Labs: BNP (last 3 results) Recent Labs    02/18/19 0240 02/19/19 0105 02/20/19 0050  BNP 303.7* 133.6* 412.8*   Basic Metabolic Panel: Recent Labs  Lab 02/15/19 2246  02/17/19 0502 02/18/19 0240 02/19/19 0105 02/20/19 0050 02/21/19 0035  NA 135   < > 134* 133* 134* 138 140  K 3.7   < > 4.5 4.5 4.3 4.0 3.9  CL 105   < > 107 105 108 108 108  CO2 20*   < > 17* 16* 17* 18* 20*  GLUCOSE 119*   < > 254* 467* 221* 275* 133*  BUN 60*   < > 68* 84* 91* 93* 99*  CREATININE 2.74*   < > 2.32* 2.29* 2.21* 2.23* 2.16*  CALCIUM 8.8*   < > 8.2* 8.3* 8.3* 8.6* 9.2  MG 1.5*  1.4*  --  2.0 2.1 2.0 1.9  --   PHOS 4.2  --  4.7*  --   --   --   --    < > = values in this interval not displayed.   Liver Function Tests: Recent Labs  Lab 02/17/19 0502 02/18/19 0240 02/19/19 0105 02/20/19 0050 02/21/19 0035  AST 38 43* 34 60* 32  ALT 26 29 26  37 35  ALKPHOS 56 60 56 64 58  BILITOT 0.1* 0.6 0.7 0.2* 0.3  PROT 6.2* 6.2* 6.1* 6.3* 6.3*  ALBUMIN 2.7* 2.7* 2.8* 2.8* 2.9*   No results for input(s): LIPASE, AMYLASE in the last 168 hours. No results for input(s): AMMONIA in the last 168 hours. CBC: Recent Labs  Lab 02/15/19 2246 02/16/19 1418 02/17/19 0502 02/18/19 0240 02/19/19 0105 02/20/19 0050  WBC 4.8 4.1 2.7* 5.3 8.0 9.5  NEUTROABS 3.3  --  2.2 4.7 7.4 8.7*  HGB 10.6* 10.0* 10.0* 10.2* 10.6* 11.5*  HCT 34.7* 33.7* 32.4* 32.1* 33.4* 35.9*  MCV 94.0 98.5 92.8 91.2 91.0 89.3  PLT 178 141* 165 201 235 258   Cardiac Enzymes: No results for input(s): CKTOTAL, CKMB, CKMBINDEX, TROPONINI in the last 168 hours. BNP: Invalid input(s): POCBNP CBG: Recent Labs  Lab 02/19/19 2052 02/20/19 0735 02/20/19 1151 02/20/19 1553 02/20/19 2019  GLUCAP 261* 247* 222* 193* 154*   D-Dimer Recent Labs    02/19/19 0105 02/20/19 0050  DDIMER 1.86* 1.51*   Hgb A1c No results for input(s): HGBA1C in the last 72 hours. Lipid Profile No results for input(s): CHOL, HDL, LDLCALC, TRIG, CHOLHDL, LDLDIRECT in the last 72 hours. Thyroid function studies No results for input(s): TSH, T4TOTAL, T3FREE, THYROIDAB in the last 72 hours.  Invalid input(s): FREET3 Anemia work up No results for input(s): VITAMINB12, FOLATE, FERRITIN, TIBC, IRON, RETICCTPCT in the last 72 hours. Urinalysis    Component Value Date/Time   COLORURINE YELLOW 02/04/2015 1547   APPEARANCEUR CLEAR 02/04/2015 1547   LABSPEC 1.012 02/04/2015 1547   PHURINE 5.0 02/04/2015 1547   GLUCOSEU NEGATIVE 02/04/2015 1547   HGBUR NEGATIVE 02/04/2015 1547   BILIRUBINUR NEGATIVE 02/04/2015 1547   KETONESUR NEGATIVE 02/04/2015 1547   PROTEINUR NEGATIVE 02/04/2015 1547   UROBILINOGEN 0.2 02/04/2015 1547   NITRITE NEGATIVE 02/04/2015 1547   LEUKOCYTESUR NEGATIVE 02/04/2015 1547   Sepsis Labs Invalid input(s): PROCALCITONIN,  WBC,  LACTICIDVEN Microbiology Recent Results (from the past 240 hour(s))  SARS CORONAVIRUS 2 (TAT 6-24 HRS) Nasopharyngeal Nasopharyngeal Swab     Status: Abnormal   Collection Time: 02/15/19  6:10 PM   Specimen: Nasopharyngeal Swab  Result Value Ref Range Status   SARS Coronavirus 2 POSITIVE (A) NEGATIVE Final    Comment: RESULT CALLED TO, READ BACK BY AND VERIFIED WITH: B.CERIC RN 0124 02/16/2019  MCCORMICK K (NOTE) SARS-CoV-2 target nucleic acids are DETECTED. The SARS-CoV-2 RNA is generally detectable in upper and lower respiratory specimens during the acute phase of infection. Positive results are indicative of active infection with SARS-CoV-2. Clinical  correlation with patient history and other diagnostic information is necessary to determine patient infection status. Positive results do  not rule out bacterial infection or co-infection with other viruses. The expected result is Negative. Fact Sheet for Patients: SugarRoll.be Fact Sheet for Healthcare Providers: https://www.woods-mathews.com/ This test is not yet approved or cleared by the [place] FDA and  has been authorized for detection and/or diagnosis of SARS-CoV-2 by FDA under an Emergency Use Authorization (EUA). This EUA will remain  in effect (meaning this test can be used) fo r the duration of the COVID-19 declaration under Section 564(b)(1) of the Act, 21 U.S.C. section 360bbb-3(b)(1), unless the authorization is terminated or revoked sooner. Performed at [facility], [place] [address], [place], [place] [address]   Blood Culture (routine x 2)     Status: None   Collection Time: 02/15/19  6:10 PM   Specimen: BLOOD LEFT FOREARM  Result Value Ref Range Status   Specimen Description   Final    BLOOD LEFT FOREARM Performed at [facility], [place] [address], [place], [place] [address]    Special Requests   Final    BOTTLES DRAWN AEROBIC AND ANAEROBIC Blood Culture adequate volume Performed at [facility], [place] [address] [place] [address] [place], [place] [address]  Culture   Final    NO GROWTH 5 DAYS Performed at [facility], [place] [address], [place], [place] [address]    Report Status 02/20/2019 FINAL  Final  Blood Culture (routine x 2)     Status: None   Collection Time: 02/15/19  6:10 PM   Specimen: BLOOD  Result  Value Ref Range Status   Specimen Description   Final    BLOOD RIGHT ANTECUBITAL Performed at [facility], [place] [address], [place], [place] [address]    Special Requests   Final    BOTTLES DRAWN AEROBIC AND ANAEROBIC Blood Culture adequate volume Performed at [facility], [place] [address] [place], [place] [address]    Culture   Final    NO GROWTH 5 DAYS Performed at [facility], [place] [address], [place], [place] [address]    Report Status 02/20/2019 FINAL  Final     Time coordinating discharge: 45 minutes  SIGNED:   Tawni Millers, MD  [facility] 02/21/2019, 7:46 AM

## 2019-02-21 NOTE — TOC Progression Note (Addendum)
Transition of Care [facility]) - Progression Note    Patient Details  Name: MAHARI VANKIRK MRN: [id number] Date of Birth: [birth year]  Transition of [facility] CM/SW Contact  Loletha Grayer Beverely Pace, RN Phone Number: 02/21/2019, 12:02 PM  Clinical Narrative:   Case manager spoke with patient via telephone to discuss available choices for SNF. Patient states she lives alone, her brother and sister-in-law are her contact persons. Case manager received permission from patient to send her information to [employer] and [employer]. CM has completed FL2 and assessment, Will call [place] to begin authorization process.  1212: Reference Number [id number]. Clinicals,FL2 H&P, PT eval notes faxed to [phone].    Expected Discharge Plan: [place] Barriers to Discharge: No Barriers Identified  Expected Discharge Plan and Services Expected Discharge Plan: [place]   Discharge Planning Services: CM Consult   Living arrangements for the past 2 months: Apartment                 DME Arranged: N/A DME Agency: NA       HH Arranged: NA HH Agency: NA         Social Determinants of Health (SDOH) Interventions    Readmission Risk Interventions No flowsheet data found.

## 2019-02-21 NOTE — Care Management Important Message (Signed)
Important Message  Patient Details  Name: [name] MRN: [id number] Date of Birth: 1940/07/24   Medicare Important Message Given:  Yes - Important Message mailed due to current National Emergency  Verbal consent obtained due to current National Emergency  Relationship to patient: Family Member Contact Name: Darra Lis Call Date: 02/21/19  Time: 1458 Phone: [phone] Outcome: No Answer/Busy Important Message mailed to: Patient address on file    Delorse Lek 02/21/2019, 2:58 PM

## 2019-02-22 LAB — GLUCOSE, CAPILLARY: Glucose-Capillary: 160 mg/dL — ABNORMAL HIGH (ref 70–99)

## 2019-02-28 ENCOUNTER — Ambulatory Visit: Payer: Medicare HMO | Admitting: Surgery

## 2019-03-05 DIAGNOSIS — J449 Chronic obstructive pulmonary disease, unspecified: Secondary | ICD-10-CM | POA: Diagnosis not present

## 2019-03-05 DIAGNOSIS — I5033 Acute on chronic diastolic (congestive) heart failure: Secondary | ICD-10-CM | POA: Diagnosis not present

## 2019-03-05 DIAGNOSIS — J9621 Acute and chronic respiratory failure with hypoxia: Secondary | ICD-10-CM | POA: Diagnosis not present

## 2019-03-05 DIAGNOSIS — I10 Essential (primary) hypertension: Secondary | ICD-10-CM | POA: Diagnosis not present

## 2019-03-05 DIAGNOSIS — R2681 Unsteadiness on feet: Secondary | ICD-10-CM | POA: Diagnosis not present

## 2019-03-05 DIAGNOSIS — N184 Chronic kidney disease, stage 4 (severe): Secondary | ICD-10-CM | POA: Diagnosis not present

## 2019-03-05 DIAGNOSIS — M329 Systemic lupus erythematosus, unspecified: Secondary | ICD-10-CM | POA: Diagnosis not present

## 2019-03-05 DIAGNOSIS — F339 Major depressive disorder, recurrent, unspecified: Secondary | ICD-10-CM | POA: Diagnosis not present

## 2019-03-05 DIAGNOSIS — E119 Type 2 diabetes mellitus without complications: Secondary | ICD-10-CM | POA: Diagnosis not present

## 2019-03-05 DIAGNOSIS — R262 Difficulty in walking, not elsewhere classified: Secondary | ICD-10-CM | POA: Diagnosis not present

## 2019-03-05 DIAGNOSIS — E785 Hyperlipidemia, unspecified: Secondary | ICD-10-CM | POA: Diagnosis not present

## 2019-03-05 DIAGNOSIS — U071 COVID-19: Secondary | ICD-10-CM | POA: Diagnosis not present

## 2019-03-05 DIAGNOSIS — M6281 Muscle weakness (generalized): Secondary | ICD-10-CM | POA: Diagnosis not present

## 2019-05-09 ENCOUNTER — Encounter: Payer: Self-pay | Admitting: Surgery

## 2019-05-09 ENCOUNTER — Other Ambulatory Visit: Payer: Self-pay

## 2019-05-09 ENCOUNTER — Ambulatory Visit (INDEPENDENT_AMBULATORY_CARE_PROVIDER_SITE_OTHER): Payer: Medicare HMO | Admitting: Surgery

## 2019-05-09 DIAGNOSIS — M25512 Pain in left shoulder: Secondary | ICD-10-CM

## 2019-05-09 DIAGNOSIS — G5601 Carpal tunnel syndrome, right upper limb: Secondary | ICD-10-CM | POA: Diagnosis not present

## 2019-05-09 DIAGNOSIS — M19012 Primary osteoarthritis, left shoulder: Secondary | ICD-10-CM | POA: Diagnosis not present

## 2019-05-09 NOTE — Progress Notes (Signed)
79 year old black female comes in today with complaints of left shoulder pain and right hand numbness and tingling.  Patient has known history of end-stage DJD left shoulder.  States that Dr. Donavan Burnet did a left shoulder injection last year I gave temporary improvement.  She is requesting repeat injection today.  Patient also has chronic right hand numbness and tingling.  Previous NCV/EMG study August 2020 showed severe right carpal tunnel syndrome.  Dr. Louanne Skye had discussed doing right carpal tunnel release.  Patient has multiple medical issues and I wonder whether or not she would be a good operative candidate even for carpal tunnel.  I did schedule her to come in to see Dr. Legrand Como hilts tomorrow for ultrasound-guided left shoulder intra-articular Marcaine/Depo-Medrol injection.  She can return back to see Dr. Louanne Skye in about 2 months regarding her right carpal tunnel.  Patient comes in with caregiver today.  I did asked caregiver to help patient contact her cardiologist, PCP and kidney doctor for clearances that we would need for entertaining the idea of surgery.  I did advise patient and caregiver that I have some concern that even if we are able to get a clearance that we may not be able to do the surgery in an outpatient setting due to all of the medical issues that she has.  All questions answered.

## 2019-05-10 ENCOUNTER — Ambulatory Visit (INDEPENDENT_AMBULATORY_CARE_PROVIDER_SITE_OTHER): Payer: Medicare HMO | Admitting: Family Medicine

## 2019-05-10 ENCOUNTER — Ambulatory Visit: Payer: Self-pay

## 2019-05-10 ENCOUNTER — Encounter: Payer: Self-pay | Admitting: Family Medicine

## 2019-05-10 DIAGNOSIS — M25512 Pain in left shoulder: Secondary | ICD-10-CM

## 2019-05-10 NOTE — Progress Notes (Signed)
Subjective: Patient is here for ultrasound-guided intra-articular left glenohumeral injection.  She has end-stage glenohumeral arthritis and is a poor surgical candidate.  Incidentally, she is still having right hand pain.  She has carpal tunnel syndrome and DJD in the thumb MCP joint.  I injected her thumb in October but it really only helped for a couple weeks.  She is discussing surgical options with Dr. Louanne Skye, awaiting clearance from cardiologist.  Objective: Limited range of motion in the left shoulder.  Procedure: Ultrasound-guided left glenohumeral injection: After sterile prep with Betadine, injected 8 cc 1% lidocaine without epinephrine and 40 mg methylprednisolone using a 22-gauge spinal needle, passing the needle through approach into the glenohumeral joint.  Injectate seen filling the joint capsule.  Follow-up as directed.

## 2019-05-21 ENCOUNTER — Emergency Department (HOSPITAL_COMMUNITY): Payer: Medicare HMO

## 2019-05-21 ENCOUNTER — Encounter (HOSPITAL_COMMUNITY): Payer: Self-pay

## 2019-05-21 ENCOUNTER — Inpatient Hospital Stay (HOSPITAL_COMMUNITY)
Admission: EM | Admit: 2019-05-21 | Discharge: 2019-05-24 | DRG: 190 | Disposition: A | Discharge status: Home Health | Payer: Medicare HMO | Attending: Internal Medicine | Admitting: Internal Medicine

## 2019-05-21 ENCOUNTER — Other Ambulatory Visit: Payer: Self-pay

## 2019-05-21 DIAGNOSIS — Z7951 Long term (current) use of inhaled steroids: Secondary | ICD-10-CM

## 2019-05-21 DIAGNOSIS — Z7982 Long term (current) use of aspirin: Secondary | ICD-10-CM

## 2019-05-21 DIAGNOSIS — G629 Polyneuropathy, unspecified: Secondary | ICD-10-CM

## 2019-05-21 DIAGNOSIS — K219 Gastro-esophageal reflux disease without esophagitis: Secondary | ICD-10-CM | POA: Diagnosis present

## 2019-05-21 DIAGNOSIS — M79605 Pain in left leg: Secondary | ICD-10-CM

## 2019-05-21 DIAGNOSIS — Z7983 Long term (current) use of bisphosphonates: Secondary | ICD-10-CM

## 2019-05-21 DIAGNOSIS — Z66 Do not resuscitate: Secondary | ICD-10-CM | POA: Diagnosis present

## 2019-05-21 DIAGNOSIS — Z91018 Allergy to other foods: Secondary | ICD-10-CM

## 2019-05-21 DIAGNOSIS — Z20822 Contact with and (suspected) exposure to covid-19: Secondary | ICD-10-CM | POA: Diagnosis present

## 2019-05-21 DIAGNOSIS — Z8616 Personal history of COVID-19: Secondary | ICD-10-CM

## 2019-05-21 DIAGNOSIS — Z880 Allergy status to penicillin: Secondary | ICD-10-CM

## 2019-05-21 DIAGNOSIS — I130 Hypertensive heart and chronic kidney disease with heart failure and stage 1 through stage 4 chronic kidney disease, or unspecified chronic kidney disease: Secondary | ICD-10-CM | POA: Diagnosis present

## 2019-05-21 DIAGNOSIS — Z87891 Personal history of nicotine dependence: Secondary | ICD-10-CM

## 2019-05-21 DIAGNOSIS — E1169 Type 2 diabetes mellitus with other specified complication: Secondary | ICD-10-CM | POA: Diagnosis present

## 2019-05-21 DIAGNOSIS — F419 Anxiety disorder, unspecified: Secondary | ICD-10-CM | POA: Diagnosis present

## 2019-05-21 DIAGNOSIS — Z833 Family history of diabetes mellitus: Secondary | ICD-10-CM

## 2019-05-21 DIAGNOSIS — Z9181 History of falling: Secondary | ICD-10-CM

## 2019-05-21 DIAGNOSIS — Z8249 Family history of ischemic heart disease and other diseases of the circulatory system: Secondary | ICD-10-CM

## 2019-05-21 DIAGNOSIS — W19XXXA Unspecified fall, initial encounter: Secondary | ICD-10-CM

## 2019-05-21 DIAGNOSIS — Z885 Allergy status to narcotic agent status: Secondary | ICD-10-CM

## 2019-05-21 DIAGNOSIS — N1832 Chronic kidney disease, stage 3b: Secondary | ICD-10-CM

## 2019-05-21 DIAGNOSIS — G4730 Sleep apnea, unspecified: Secondary | ICD-10-CM | POA: Diagnosis present

## 2019-05-21 DIAGNOSIS — D649 Anemia, unspecified: Secondary | ICD-10-CM | POA: Diagnosis present

## 2019-05-21 DIAGNOSIS — H409 Unspecified glaucoma: Secondary | ICD-10-CM | POA: Diagnosis present

## 2019-05-21 DIAGNOSIS — M5136 Other intervertebral disc degeneration, lumbar region: Secondary | ICD-10-CM | POA: Diagnosis present

## 2019-05-21 DIAGNOSIS — Z853 Personal history of malignant neoplasm of breast: Secondary | ICD-10-CM

## 2019-05-21 DIAGNOSIS — J441 Chronic obstructive pulmonary disease with (acute) exacerbation: Principal | ICD-10-CM | POA: Diagnosis present

## 2019-05-21 DIAGNOSIS — J449 Chronic obstructive pulmonary disease, unspecified: Secondary | ICD-10-CM | POA: Diagnosis present

## 2019-05-21 DIAGNOSIS — Z91013 Allergy to seafood: Secondary | ICD-10-CM

## 2019-05-21 DIAGNOSIS — Z8701 Personal history of pneumonia (recurrent): Secondary | ICD-10-CM

## 2019-05-21 DIAGNOSIS — I10 Essential (primary) hypertension: Secondary | ICD-10-CM

## 2019-05-21 DIAGNOSIS — E1142 Type 2 diabetes mellitus with diabetic polyneuropathy: Secondary | ICD-10-CM | POA: Diagnosis present

## 2019-05-21 DIAGNOSIS — E669 Obesity, unspecified: Secondary | ICD-10-CM | POA: Diagnosis present

## 2019-05-21 DIAGNOSIS — I5032 Chronic diastolic (congestive) heart failure: Secondary | ICD-10-CM | POA: Diagnosis present

## 2019-05-21 DIAGNOSIS — M8580 Other specified disorders of bone density and structure, unspecified site: Secondary | ICD-10-CM | POA: Diagnosis present

## 2019-05-21 DIAGNOSIS — M329 Systemic lupus erythematosus, unspecified: Secondary | ICD-10-CM | POA: Diagnosis present

## 2019-05-21 DIAGNOSIS — E785 Hyperlipidemia, unspecified: Secondary | ICD-10-CM

## 2019-05-21 DIAGNOSIS — Z9981 Dependence on supplemental oxygen: Secondary | ICD-10-CM

## 2019-05-21 DIAGNOSIS — Z794 Long term (current) use of insulin: Secondary | ICD-10-CM

## 2019-05-21 DIAGNOSIS — E1122 Type 2 diabetes mellitus with diabetic chronic kidney disease: Secondary | ICD-10-CM | POA: Diagnosis present

## 2019-05-21 DIAGNOSIS — Z79899 Other long term (current) drug therapy: Secondary | ICD-10-CM

## 2019-05-21 DIAGNOSIS — M109 Gout, unspecified: Secondary | ICD-10-CM | POA: Diagnosis present

## 2019-05-21 DIAGNOSIS — J9621 Acute and chronic respiratory failure with hypoxia: Secondary | ICD-10-CM | POA: Diagnosis not present

## 2019-05-21 DIAGNOSIS — E119 Type 2 diabetes mellitus without complications: Secondary | ICD-10-CM

## 2019-05-21 DIAGNOSIS — Z803 Family history of malignant neoplasm of breast: Secondary | ICD-10-CM

## 2019-05-21 DIAGNOSIS — Z86711 Personal history of pulmonary embolism: Secondary | ICD-10-CM

## 2019-05-21 LAB — COMPREHENSIVE METABOLIC PANEL
ALT: 20 U/L (ref 0–44)
AST: 19 U/L (ref 15–41)
Albumin: 3.7 g/dL (ref 3.5–5.0)
Alkaline Phosphatase: 81 U/L (ref 38–126)
Anion gap: 11 (ref 5–15)
BUN: 38 mg/dL — ABNORMAL HIGH (ref 8–23)
CO2: 27 mmol/L (ref 22–32)
Calcium: 10.1 mg/dL (ref 8.9–10.3)
Chloride: 103 mmol/L (ref 98–111)
Creatinine, Ser: 1.39 mg/dL — ABNORMAL HIGH (ref 0.44–1.00)
GFR calc Af Amer: 42 mL/min — ABNORMAL LOW (ref >60)
GFR calc non Af Amer: 36 mL/min — ABNORMAL LOW (ref >60)
Glucose, Bld: 80 mg/dL (ref 70–99)
Potassium: 4.5 mmol/L (ref 3.5–5.1)
Sodium: 141 mmol/L (ref 135–145)
Total Bilirubin: 0.6 mg/dL (ref 0.3–1.2)
Total Protein: 8 g/dL (ref 6.5–8.1)

## 2019-05-21 LAB — GLUCOSE, CAPILLARY
Glucose-Capillary: 71 mg/dL (ref 70–99)
Glucose-Capillary: 159 mg/dL — ABNORMAL HIGH (ref 70–99)

## 2019-05-21 LAB — RESPIRATORY PANEL BY RT PCR (FLU A&B, COVID)
Influenza A by PCR: NEGATIVE
Influenza B by PCR: NEGATIVE
SARS Coronavirus 2 by RT PCR: NEGATIVE

## 2019-05-21 LAB — URINALYSIS, ROUTINE W REFLEX MICROSCOPIC
Bacteria, UA: NONE SEEN
Bilirubin Urine: NEGATIVE
Glucose, UA: NEGATIVE mg/dL
Hgb urine dipstick: NEGATIVE
Ketones, ur: NEGATIVE mg/dL
Leukocytes,Ua: NEGATIVE
Nitrite: NEGATIVE
Protein, ur: 30 mg/dL — AB
Specific Gravity, Urine: 1.009 (ref 1.005–1.030)
pH: 8 (ref 5.0–8.0)

## 2019-05-21 LAB — CBC WITH DIFFERENTIAL/PLATELET
Abs Immature Granulocytes: 0.05 10*3/uL (ref 0.00–0.07)
Basophils Absolute: 0 10*3/uL (ref 0.0–0.1)
Basophils Relative: 0 %
Eosinophils Absolute: 0.2 10*3/uL (ref 0.0–0.5)
Eosinophils Relative: 2 %
HCT: 34.7 % — ABNORMAL LOW (ref 36.0–46.0)
Hemoglobin: 10.4 g/dL — ABNORMAL LOW (ref 12.0–15.0)
Immature Granulocytes: 0 %
Lymphocytes Relative: 11 %
Lymphs Abs: 1.2 10*3/uL (ref 0.7–4.0)
MCH: 28.1 pg (ref 26.0–34.0)
MCHC: 30 g/dL (ref 30.0–36.0)
MCV: 93.8 fL (ref 80.0–100.0)
Monocytes Absolute: 0.5 10*3/uL (ref 0.1–1.0)
Monocytes Relative: 5 %
Neutro Abs: 9.2 10*3/uL — ABNORMAL HIGH (ref 1.7–7.7)
Neutrophils Relative %: 82 %
Platelets: 342 10*3/uL (ref 150–400)
RBC: 3.7 MIL/uL — ABNORMAL LOW (ref 3.87–5.11)
RDW: 16.9 % — ABNORMAL HIGH (ref 11.5–15.5)
WBC: 11.3 10*3/uL — ABNORMAL HIGH (ref 4.0–10.5)
nRBC: 0 % (ref 0.0–0.2)

## 2019-05-21 LAB — TROPONIN I (HIGH SENSITIVITY)
Troponin I (High Sensitivity): 11 ng/L (ref <18)
Troponin I (High Sensitivity): 15 ng/L (ref <18)

## 2019-05-21 LAB — HEMOGLOBIN A1C
Hgb A1c MFr Bld: 5.2 % (ref 4.8–5.6)
Mean Plasma Glucose: 102.54 mg/dL

## 2019-05-21 MED ORDER — TRAMADOL HCL 50 MG PO TABS: 50.0000 mg | ORAL_TABLET | Freq: Four times a day (QID) | ORAL | Status: DC | PRN

## 2019-05-21 MED ORDER — METHYLPREDNISOLONE SODIUM SUCC 40 MG IJ SOLR
40.0000 mg | Freq: Two times a day (BID) | INTRAMUSCULAR | Status: DC
  Administered 2019-05-22 – 2019-05-23 (×3): 40 mg via INTRAVENOUS
  Filled 2019-05-21 (×4): qty 1

## 2019-05-21 MED ORDER — HYDROCHLOROTHIAZIDE 12.5 MG PO CAPS
12.5000 mg | ORAL_CAPSULE | Freq: Every day | ORAL | Status: DC
  Administered 2019-05-21 – 2019-05-22 (×2): 12.5 mg via ORAL
  Filled 2019-05-21 (×2): qty 1

## 2019-05-21 MED ORDER — FUROSEMIDE 40 MG PO TABS
40.0000 mg | ORAL_TABLET | Freq: Two times a day (BID) | ORAL | Status: DC
  Administered 2019-05-21 – 2019-05-24 (×6): 40 mg via ORAL
  Filled 2019-05-21 (×6): qty 1

## 2019-05-21 MED ORDER — GABAPENTIN 300 MG PO CAPS
300.0000 mg | ORAL_CAPSULE | Freq: Two times a day (BID) | ORAL | Status: DC
  Administered 2019-05-21 – 2019-05-24 (×7): 300 mg via ORAL
  Filled 2019-05-21 (×7): qty 1

## 2019-05-21 MED ORDER — CITALOPRAM HYDROBROMIDE 10 MG PO TABS
10.0000 mg | ORAL_TABLET | Freq: Every day | ORAL | Status: DC
  Administered 2019-05-21 – 2019-05-24 (×4): 10 mg via ORAL
  Filled 2019-05-21 (×4): qty 1

## 2019-05-21 MED ORDER — ISOSORBIDE MONONITRATE ER 30 MG PO TB24
30.0000 mg | ORAL_TABLET | Freq: Every day | ORAL | Status: DC
  Administered 2019-05-21 – 2019-05-24 (×4): 30 mg via ORAL
  Filled 2019-05-21 (×4): qty 1

## 2019-05-21 MED ORDER — VITAMIN B-12 1000 MCG PO TABS
1000.0000 ug | ORAL_TABLET | Freq: Every day | ORAL | Status: DC
  Administered 2019-05-21 – 2019-05-24 (×4): 1000 ug via ORAL
  Filled 2019-05-21 (×4): qty 1

## 2019-05-21 MED ORDER — ACETAMINOPHEN 500 MG PO TABS: 500.0000 mg | ORAL_TABLET | Freq: Four times a day (QID) | ORAL | Status: DC | PRN

## 2019-05-21 MED ORDER — ALLOPURINOL 100 MG PO TABS
100.0000 mg | ORAL_TABLET | Freq: Every day | ORAL | Status: DC
  Administered 2019-05-21 – 2019-05-24 (×4): 100 mg via ORAL
  Filled 2019-05-21 (×4): qty 1

## 2019-05-21 MED ORDER — HEPARIN SODIUM (PORCINE) 5000 UNIT/ML IJ SOLN
5000.0000 [IU] | Freq: Three times a day (TID) | INTRAMUSCULAR | Status: DC
  Administered 2019-05-21 – 2019-05-24 (×8): 5000 [IU] via SUBCUTANEOUS
  Filled 2019-05-21 (×8): qty 1

## 2019-05-21 MED ORDER — MORPHINE SULFATE (PF) 4 MG/ML IV SOLN
4.0000 mg | Freq: Once | INTRAVENOUS | Status: AC
  Administered 2019-05-21: 12:00:00 4 mg via INTRAVENOUS
  Filled 2019-05-21: qty 1

## 2019-05-21 MED ORDER — IPRATROPIUM-ALBUTEROL 0.5-2.5 (3) MG/3ML IN SOLN
3.0000 mL | Freq: Four times a day (QID) | RESPIRATORY_TRACT | Status: DC
  Administered 2019-05-22 (×3): 3 mL via RESPIRATORY_TRACT
  Filled 2019-05-21 (×3): qty 3

## 2019-05-21 MED ORDER — ALBUTEROL SULFATE HFA 108 (90 BASE) MCG/ACT IN AERS
4.0000 | INHALATION_SPRAY | Freq: Once | RESPIRATORY_TRACT | Status: AC
  Administered 2019-05-21: 12:00:00 4 via RESPIRATORY_TRACT
  Filled 2019-05-21: qty 6.7

## 2019-05-21 MED ORDER — IPRATROPIUM-ALBUTEROL 0.5-2.5 (3) MG/3ML IN SOLN
3.0000 mL | Freq: Four times a day (QID) | RESPIRATORY_TRACT | Status: DC
  Administered 2019-05-21 (×2): 3 mL via RESPIRATORY_TRACT
  Filled 2019-05-21: qty 3

## 2019-05-21 MED ORDER — LISINOPRIL 20 MG PO TABS
20.0000 mg | ORAL_TABLET | Freq: Every day | ORAL | Status: DC
  Administered 2019-05-21 – 2019-05-22 (×2): 20 mg via ORAL
  Filled 2019-05-21 (×2): qty 1

## 2019-05-21 MED ORDER — ASPIRIN EC 81 MG PO TBEC
81.0000 mg | DELAYED_RELEASE_TABLET | Freq: Every day | ORAL | Status: DC
  Administered 2019-05-21 – 2019-05-24 (×4): 81 mg via ORAL
  Filled 2019-05-21 (×4): qty 1

## 2019-05-21 MED ORDER — PANTOPRAZOLE SODIUM 40 MG PO TBEC
40.0000 mg | DELAYED_RELEASE_TABLET | Freq: Every day | ORAL | Status: DC
  Administered 2019-05-21 – 2019-05-24 (×4): 40 mg via ORAL
  Filled 2019-05-21 (×4): qty 1

## 2019-05-21 MED ORDER — METOPROLOL TARTRATE 50 MG PO TABS
50.0000 mg | ORAL_TABLET | Freq: Two times a day (BID) | ORAL | Status: DC
  Administered 2019-05-21 – 2019-05-24 (×7): 50 mg via ORAL
  Filled 2019-05-21 (×7): qty 1

## 2019-05-21 MED ORDER — AZITHROMYCIN 250 MG PO TABS
500.0000 mg | ORAL_TABLET | Freq: Every day | ORAL | Status: AC
  Administered 2019-05-21 – 2019-05-23 (×3): 500 mg via ORAL
  Filled 2019-05-21 (×3): qty 2

## 2019-05-21 MED ORDER — INSULIN ASPART 100 UNIT/ML ~~LOC~~ SOLN: 0.0000 [IU] | Freq: Every day | SUBCUTANEOUS | Status: DC

## 2019-05-21 MED ORDER — PRAVASTATIN SODIUM 20 MG PO TABS
40.0000 mg | ORAL_TABLET | Freq: Every day | ORAL | Status: DC
  Administered 2019-05-21 – 2019-05-23 (×3): 40 mg via ORAL
  Filled 2019-05-21 (×4): qty 2

## 2019-05-21 MED ORDER — HYDRALAZINE HCL 50 MG PO TABS
50.0000 mg | ORAL_TABLET | Freq: Three times a day (TID) | ORAL | Status: DC
  Administered 2019-05-21 – 2019-05-24 (×9): 50 mg via ORAL
  Filled 2019-05-21 (×9): qty 1

## 2019-05-21 MED ORDER — METHYLPREDNISOLONE SODIUM SUCC 125 MG IJ SOLR
125.0000 mg | Freq: Once | INTRAMUSCULAR | Status: AC
  Administered 2019-05-21: 12:00:00 125 mg via INTRAVENOUS
  Filled 2019-05-21: qty 2

## 2019-05-21 MED ORDER — IPRATROPIUM-ALBUTEROL 0.5-2.5 (3) MG/3ML IN SOLN
3.0000 mL | Freq: Once | RESPIRATORY_TRACT | Status: AC
  Administered 2019-05-21: 14:00:00 3 mL via RESPIRATORY_TRACT
  Filled 2019-05-21: qty 3

## 2019-05-21 MED ORDER — LISINOPRIL-HYDROCHLOROTHIAZIDE 20-12.5 MG PO TABS: 1.0000 | ORAL_TABLET | Freq: Every day | ORAL | Status: DC

## 2019-05-21 MED ORDER — INSULIN ASPART 100 UNIT/ML ~~LOC~~ SOLN
0.0000 [IU] | Freq: Three times a day (TID) | SUBCUTANEOUS | Status: DC
  Administered 2019-05-22: 13:00:00 11 [IU] via SUBCUTANEOUS
  Administered 2019-05-22: 08:00:00 4 [IU] via SUBCUTANEOUS
  Administered 2019-05-23: 7 [IU] via SUBCUTANEOUS
  Administered 2019-05-23: 11 [IU] via SUBCUTANEOUS
  Administered 2019-05-23: 09:00:00 4 [IU] via SUBCUTANEOUS
  Administered 2019-05-24: 12:00:00 7 [IU] via SUBCUTANEOUS
  Administered 2019-05-24: 4 [IU] via SUBCUTANEOUS

## 2019-05-21 MED ORDER — GUAIFENESIN ER 600 MG PO TB12
1200.0000 mg | ORAL_TABLET | Freq: Two times a day (BID) | ORAL | Status: DC
  Administered 2019-05-21 – 2019-05-24 (×6): 1200 mg via ORAL
  Filled 2019-05-21 (×7): qty 2

## 2019-05-21 MED ORDER — INSULIN ASPART 100 UNIT/ML ~~LOC~~ SOLN
10.0000 [IU] | Freq: Three times a day (TID) | SUBCUTANEOUS | Status: DC
  Administered 2019-05-22: 13:00:00 10 [IU] via SUBCUTANEOUS
  Filled 2019-05-21: qty 0.1

## 2019-05-21 MED ORDER — MOMETASONE FURO-FORMOTEROL FUM 200-5 MCG/ACT IN AERO
2.0000 | INHALATION_SPRAY | Freq: Two times a day (BID) | RESPIRATORY_TRACT | Status: DC
  Administered 2019-05-21 – 2019-05-24 (×6): 2 via RESPIRATORY_TRACT
  Filled 2019-05-21: qty 8.8

## 2019-05-21 MED ORDER — DILTIAZEM HCL ER COATED BEADS 300 MG PO CP24
300.0000 mg | ORAL_CAPSULE | Freq: Every day | ORAL | Status: DC
  Administered 2019-05-21 – 2019-05-24 (×4): 300 mg via ORAL
  Filled 2019-05-21 (×4): qty 1

## 2019-05-21 MED ORDER — LABETALOL HCL 5 MG/ML IV SOLN
10.0000 mg | INTRAVENOUS | Status: DC | PRN
  Filled 2019-05-21: qty 4

## 2019-05-21 MED ORDER — INSULIN GLARGINE 100 UNIT/ML ~~LOC~~ SOLN
40.0000 [IU] | Freq: Every day | SUBCUTANEOUS | Status: DC
  Administered 2019-05-22 – 2019-05-24 (×3): 40 [IU] via SUBCUTANEOUS
  Filled 2019-05-21 (×4): qty 0.4

## 2019-05-21 NOTE — H&P (Addendum)
History and Physical    [name] [name] XNA:[id number] DOB: [birth year] DOA: 05/21/2019  PCP: [name] Ebbs, MD   Patient coming from: Home    Chief Complaint: Shortness of breath, subjective fever, fall  HPI: [name] [name] is a 79 y.o. female with medical history significant of Covid pneumonia on November 5427, diastolic CHF, COPD on 2 L of oxygen at home, CKD stage IIIb, SLE, diabetes type 2, hypertension, hyperlipidemia, history of PE who presented from home with complaints of shortness of breath, fever, fall.  Patient said she fell tripped over her oxygen canister and fell to the ground.  Denies any dizziness or presyncopal symptoms.  Patient also complained of some shortness of breath since yesterday and was also wheezing.  She has been using her home nebulizer machine without minimal relief.  Also had subjective fever at home but afebrile on presentation.  Patient is a poor historian.  She follows with pulmonology as an outpatient for management of COPD.  She also follows with cardiology. Patient seen and examined at the bedside in the emergency department.  She was  hypertensive.  She was not in any acute respiratory distress.  She denied any chest pain, abdomen pain, nausea, vomiting or diarrhea.  ED Course: Required 3 L of oxygen per minute in the emergency department.  Chest x-ray showed interstitial scarring and central vascular congestion but not   pneumonia.  Admitted for the management of COPD exacerbation.  Started on bronchodilators and steroids.  Review of Systems: As per HPI otherwise 10 point review of systems negative.    Past Medical History:  Diagnosis Date  . Allergic rhinitis   . Anxiety   . Arthritis   . Asthma   . Cancer [facility]) 2006   breast cancer right  . CHF (congestive heart failure) ([place])   . COPD (chronic obstructive pulmonary disease) ([place])   . Degenerative disc disease, lumbar   . Diabetes mellitus without complication ([place])   . Dyspnea    walking  distances  . Eczema   . GERD (gastroesophageal reflux disease)   . Glaucoma   . History of home oxygen therapy    uses 2 liters ay night and prn  . Hyperlipidemia   . Hypertension   . Low back pain   . Lupus ([place])    skin  . Neck pain   . Numbness and tingling   . Obesity   . Osteopenia   . Pulmonary embolism ([place]) 01/2012    CT showed multi small PE and coumadized   . Sleep apnea 2010   no cpap used  . Systemic lupus erythematosus ([place])     Past Surgical History:  Procedure Laterality Date  . ABDOMINAL HYSTERECTOMY  1976  . BACK SURGERY  2006   lower  . BREAST BIOPSY Right   . BREAST EXCISIONAL BIOPSY Left   . BREAST LUMPECTOMY Right   . COLONOSCOPY WITH PROPOFOL N/A 03/23/2015   Procedure: COLONOSCOPY WITH PROPOFOL;  Surgeon: Laurence Spates, MD;  Location: [facility] ENDOSCOPY;  Service: Endoscopy;  Laterality: N/A;  . Dobtamine myoview  05/02/2008   EF 67% ; LV norm  . DOPPLER ECHOCARDIOGRAPHY  01/25/2012   EF 55 TO 60%; LV norm.  . EXPLORATORY LAPAROTOMY    . EYE SURGERY Bilateral 2010   lens reaplcments for cataracts   . Lower Extrem. venous doppler  01/25/2012    neg.  . [place]     reports that she has quit  smoking. She has never used smokeless tobacco. She reports that she does not drink alcohol or use drugs.  Allergies  Allergen Reactions  . Penicillins Swelling    Has patient had a PCN reaction causing immediate rash, facial/tongue/throat swelling, SOB or lightheadedness with hypotension: Yes Has patient had a PCN reaction causing severe rash involving mucus membranes or skin necrosis: Yes Has patient had a PCN reaction that required hospitalization Yes Has patient had a PCN reaction occurring within the last 10 years: No, more than 10 yrs ago If all of the above answers are "NO", then may proceed with Cephalosporin use.   . Fentanyl Itching  . Peach [Prunus Persica] Hives  . Shellfish Allergy Hives    Family History  Problem Relation  Age of Onset  . Heart failure Father   . Heart failure Mother   . Diabetes Brother   . Breast cancer Maternal Aunt   . Breast cancer Paternal Aunt      Prior to Admission medications   Medication Sig Start Date End Date Taking? Authorizing Provider  acetaminophen (TYLENOL) 500 MG tablet Take 500 mg by mouth every 6 (six) hours as needed for mild pain or moderate pain.   Yes [provider]  albuterol (PROVENTIL HFA;VENTOLIN HFA) 108 (90 BASE) MCG/ACT inhaler Inhale 2 puffs into the lungs See admin instructions. Every 6 hours as needed for SOB but also takes 2 puffs BID scheduled.   Yes [provider]  allopurinol (ZYLOPRIM) 100 MG tablet Take 100 mg by mouth daily.   Yes [provider]  aspirin EC 81 MG tablet Take 81 mg by mouth daily.   Yes [provider]  brimonidine (ALPHAGAN P) 0.1 % SOLN Place 1 drop into both eyes 2 (two) times a day.   Yes [provider]  calcium carbonate (OS-CAL) 1250 (500 Ca) MG chewable tablet Chew 1 tablet by mouth daily.   Yes [provider]  cetirizine (ZYRTEC) 10 MG tablet Take 10 mg by mouth daily.   Yes [provider]  cholecalciferol (VITAMIN D) 1000 UNITS tablet Take 1,000 Units by mouth daily.   Yes [provider]  citalopram (CELEXA) 10 MG tablet Take 10 mg by mouth daily.    Yes [provider]  clobetasol ointment (TEMOVATE) 4.81 % Apply 1 application topically 2 (two) times daily.    Yes [provider]  colchicine (COLCRYS) 0.6 MG tablet Take 0.5 tablets (0.3 mg total) by mouth daily. 02/21/19 05/21/19 Yes Arrien, Jimmy Picket, MD  desonide (DESOWEN) 0.05 % cream Apply 1 application topically 2 (two) times daily.    Yes [provider]  dexlansoprazole (DEXILANT) 60 MG capsule Take 60 mg by mouth daily.    Yes [provider]  diclofenac sodium (VOLTAREN) 1 % GEL Apply 2 g topically daily as needed (leg pain). Reported on 09/15/2015   Yes  [provider]  diltiazem (CARTIA XT) 300 MG 24 hr capsule Take 300 mg by mouth daily. Reported on 09/15/2015   Yes [provider]  Fluticasone-Salmeterol (ADVAIR DISKUS) 500-50 MCG/DOSE AEPB Inhale 1 puff into the lungs 2 (two) times daily. Patient taking differently: Inhale 2 puffs into the lungs 2 (two) times daily.  02/21/19 05/21/19 Yes Arrien, Jimmy Picket, MD  furosemide (LASIX) 80 MG tablet Take 1 tablet (80 mg total) by mouth 2 (two) times daily. Patient taking differently: Take 40 mg by mouth 2 (two) times daily.  02/04/19  Yes Lorretta Harp, MD  gabapentin (  NEURONTIN) 300 MG capsule Take 1 capsule (300 mg total) by mouth 2 (two) times daily. Patient taking differently: Take 100 mg by mouth 3 (three) times daily.  04/05/18  Yes Jessy Oto, MD  hydrALAZINE (APRESOLINE) 50 MG tablet Take 50 mg by mouth 3 (three) times daily.    Yes [provider]  insulin aspart (NOVOLOG) 100 UNIT/ML injection Inject 10 Units into the skin 3 (three) times daily before meals.    Yes [provider]  insulin glargine (LANTUS) 100 UNIT/ML injection Inject 0.35 mLs (35 Units total) into the skin 2 (two) times daily. Patient taking differently: Inject 45-50 Units into the skin See admin instructions. Take 45 units in the morning and 50 units at bedtime 02/21/19 05/21/19 Yes Arrien, Jimmy Picket, MD  isosorbide mononitrate (IMDUR) 30 MG 24 hr tablet Take 30 mg by mouth every morning.   Yes [provider]  ketotifen (ALAWAY) 0.025 % ophthalmic solution Place 1 drop into both eyes daily as needed (allergy symptoms).    Yes [provider]  lisinopril-hydrochlorothiazide (ZESTORETIC) 20-12.5 MG tablet Take 1 tablet by mouth daily.  11/12/18  Yes [provider]  metoprolol tartrate (LOPRESSOR) 50 MG tablet Take 1 tablet by mouth twice daily Patient taking differently: Take 50 mg by mouth 2 (two) times daily.  01/16/19  Yes Kroeger, Lorelee Cover., PA-C   OXYGEN Inhale 2 L into the lungs daily as needed (shortness of breath). 2L/min - prn during day and every evening.    Yes [provider]  pravastatin (PRAVACHOL) 40 MG tablet Take 40 mg by mouth at bedtime.    Yes [provider]  SYMBICORT 160-4.5 MCG/ACT inhaler Inhale 2 puffs into the lungs 2 (two) times daily. Reported on 09/15/2015 04/22/13  Yes [provider]  tizanidine (ZANAFLEX) 2 MG capsule Take 2 mg by mouth 2 (two) times daily.   Yes [provider]  traMADol (ULTRAM) 50 MG tablet Take 1 tablet (50 mg total) by mouth every 6 (six) hours as needed for severe pain. 02/21/19  Yes Arrien, Jimmy Picket, MD  trimethoprim (TRIMPEX) 100 MG tablet Take 100 mg by mouth daily. 11/23/18  Yes [provider]  vitamin B-12 (CYANOCOBALAMIN) 1000 MCG tablet Take 1,000 mcg by mouth daily. Reported on 09/15/2015   Yes [provider]  alendronate (FOSAMAX) 70 MG tablet Take 70 mg by mouth every Monday. Take with a full glass of water on an empty stomach.    [provider]    Physical Exam: Vitals:   05/21/19 1330 05/21/19 1345 05/21/19 1400 05/21/19 1500  BP: (!) 179/88  (!) 166/80 (!) 173/85  Pulse: 89 94 87 88  Resp: (!) 21 (!) 22 18 20   Temp:      TempSrc:      SpO2: 98% 95% 98% 99%  Weight:      Height:        Constitutional: Not in distress, morbidly obese Vitals:   05/21/19 1330 05/21/19 1345 05/21/19 1400 05/21/19 1500  BP: (!) 179/88  (!) 166/80 (!) 173/85  Pulse: 89 94 87 88  Resp: (!) 21 (!) 22 18 20   Temp:      TempSrc:      SpO2: 98% 95% 98% 99%  Weight:      Height:       Eyes: PERRL, lids and conjunctivae normal ENMT: Mucous membranes are moist.  Neck: normal, supple, no masses, no thyromegaly Respiratory: Decreased bilateral air entry, expiratory wheezes normal  respiratory effort. No accessory muscle use.  Cardiovascular: Regular rate and rhythm, no murmurs / rubs / gallops. No extremity edema.  Abdomen:  no tenderness, no masses palpated. No hepatosplenomegaly. Bowel sounds positive.  Musculoskeletal: no clubbing / cyanosis. No joint deformity upper and lower extremities.  Skin: Chronic skin changes on bilateral lower extremities, no rashes, lesions, ulcers. No induration Neurologic: CN 2-12 grossly intact.  Strength 5/5 in all 4.  Psychiatric: Normal judgment and insight. Alert and oriented x 3. Normal mood.   Foley Catheter:None  Labs on Admission: I have personally reviewed following labs and imaging studies  CBC: Recent Labs  Lab 05/21/19 1147  WBC 11.3*  NEUTROABS 9.2*  HGB 10.4*  HCT 34.7*  MCV 93.8  PLT 740   Basic Metabolic Panel: Recent Labs  Lab 05/21/19 1147  NA 141  K 4.5  CL 103  CO2 27  GLUCOSE 80  BUN 38*  CREATININE 1.39*  CALCIUM 10.1   GFR: Estimated Creatinine Clearance: 38.9 mL/min (A) (by C-G formula based on SCr of 1.39 mg/dL (H)). Liver Function Tests: Recent Labs  Lab 05/21/19 1147  AST 19  ALT 20  ALKPHOS 81  BILITOT 0.6  PROT 8.0  ALBUMIN 3.7   No results for input(s): LIPASE, AMYLASE in the last 168 hours. No results for input(s): AMMONIA in the last 168 hours. Coagulation Profile: No results for input(s): INR, PROTIME in the last 168 hours. Cardiac Enzymes: No results for input(s): CKTOTAL, CKMB, CKMBINDEX, TROPONINI in the last 168 hours. BNP (last 3 results) Recent Labs    10/31/18 1528  PROBNP 478   HbA1C: No results for input(s): HGBA1C in the last 72 hours. CBG: No results for input(s): GLUCAP in the last 168 hours. Lipid Profile: No results for input(s): CHOL, HDL, LDLCALC, TRIG, CHOLHDL, LDLDIRECT in the last 72 hours. Thyroid Function Tests: No results for input(s): TSH, T4TOTAL, FREET4, T3FREE, THYROIDAB in the last 72 hours. Anemia Panel: No results for input(s): VITAMINB12, FOLATE, FERRITIN, TIBC, IRON, RETICCTPCT in the last 72 hours. Urine analysis:    Component Value Date/Time   COLORURINE STRAW (A)  05/21/2019 1147   APPEARANCEUR CLEAR 05/21/2019 1147   LABSPEC 1.009 05/21/2019 1147   PHURINE 8.0 05/21/2019 1147   GLUCOSEU NEGATIVE 05/21/2019 1147   HGBUR NEGATIVE 05/21/2019 1147   [place] 05/21/2019 1147   KETONESUR NEGATIVE 05/21/2019 1147   PROTEINUR 30 (A) 05/21/2019 1147   UROBILINOGEN 0.2 02/04/2015 1547   NITRITE NEGATIVE 05/21/2019 1147   LEUKOCYTESUR NEGATIVE 05/21/2019 1147    Radiological Exams on Admission: DG Chest Portable 1 View  Result Date: 05/21/2019 CLINICAL DATA:  [place] yesterday, COPD, CHF, short of breath EXAM: PORTABLE CHEST 1 VIEW COMPARISON:  04/19/2019 FINDINGS: Single frontal view of the chest demonstrates a stable cardiac silhouette. There is stable diffuse interstitial prominence throughout the lungs. Chronic central vascular congestion without focal airspace disease. No large effusion or pneumothorax. IMPRESSION: 1. Continued interstitial scarring and central vascular congestion. No acute airspace disease. Electronically Signed   By: Randa Ngo M.D.   On: 05/21/2019 11:53   DG Knee Complete 4 Views Left  Result Date: 05/21/2019 CLINICAL DATA:  [place] yesterday, severe left hip and knee pain EXAM: LEFT KNEE - COMPLETE 4+ VIEW COMPARISON:  None FINDINGS: Frontal, oblique, lateral views of the left knee demonstrate no fracture, subluxation, or dislocation. Three compartmental osteoarthritis greatest in the medial compartment. No joint effusion. IMPRESSION: 1. Osteoarthritis, no acute fracture. Electronically Signed   By: Legrand Como  [place] Shark M.D.   On: 05/21/2019 11:51   DG Hip Unilat W or Wo Pelvis 2-3 Views Left  Result Date: 05/21/2019 CLINICAL DATA:  [place] yesterday, severe left hip and knee pain EXAM: DG HIP (WITH OR WITHOUT PELVIS) 2-3V LEFT COMPARISON:  07/20/2012 FINDINGS: Frontal views of the pelvis as well as frontal and frogleg lateral views of the left hip are obtained. Evaluation is slightly limited due to technique and patient body habitus.  Continued right greater than left hip osteoarthritis, with progression of the axial joint space narrowing within the right hip seen previously. No acute displaced fracture. Alignment is anatomic. Soft tissues are unremarkable. IMPRESSION: 1. No acute displaced hip fracture. 2. Right greater than left hip osteoarthritis. Electronically Signed   By: Randa Ngo M.D.   On: 05/21/2019 11:49     Assessment/Plan Principal Problem:   Acute on chronic respiratory failure with hypoxia (HCC) Active Problems:   Chronic diastolic CHF (congestive heart failure) (HCC)   COPD (chronic obstructive pulmonary disease) ([place])   Diabetes mellitus ([place])   Peripheral neuropathy   Fall at home, initial encounter   COPD exacerbation ([place])   Acute on chronic hypoxic respiratory failure: Secondary to COPD exacerbation.  On 2 L of oxygen at home.  Currently on 3 L of oxygen.  Monitor respiratory status  COPD exacerbation: Previous smoker.  On 2 L of oxygen at home.  Continue bronchodilators.  Started on steroids Chest x-ray did not show pneumonia.  Uses albuterol inhaler, Symbicort.Started on azithromycin for COPD exacerbation.  CKD stage IIIb: Baseline creatinine around 2.  Currently kidney function at baseline.  Chronic diastolic CHF: Currently not in exacerbation.  Last echocardiogram showed ejection fraction of 55 to 60%.  On Lasix at home.  Hypertension: Hypertensive in the emergency department.  Continue as needed meds.  Continue home medications  Diabetes type 2: On insulin at home.  Continue sliding scale insulin and   Lantus.  Last hemoglobin A1c was 8.9 on 10/20  Hyperlipidemia: Continue statin  SLE: No active flare, previously taking  hydroxychloroquine .  Recent Covid pneumonia: Admitted to [facility] on 11/20 and was treated with remdesivir and Decadron.  Covid screen test is negative today.  Fall: Mechanical fall at home.  H/P peripheral  neuropathy and is on gabapentin.  X-rays  did not show any fracture or dislocation.  Physical therapy assessment.  Uses walker/cane/wheelchair at home  Normocytic anemia: Currently H&H stable.  Continue to monitor  History of gout: Takes allopurinol ,cochicineat home  Severity of Illness: The appropriate patient status for this patient is OBSERVATION.      DVT prophylaxis: Heparin [place] Code Status:DNR Family Communication: None present at the bedside.  Patient has been talking to her family and no need to call family members Consults called: None     Shelly Coss MD [facility]  05/21/2019, 3:10 PM

## 2019-05-21 NOTE — ED Notes (Signed)
ED TO INPATIENT HANDOFF REPORT  ED Nurse Name and Phone #: [phone]  S Name/Age/Gender [name] [name] 79 y.o. female Room/Bed: WA25/WA25  Code Status   Code Status: Full Code  Home/SNF/Other Home Patient oriented to: self, place, time and situation Is this baseline? Yes   Triage Complete: Triage complete  Chief Complaint COPD exacerbation [facility]) [J44.1]  Triage Note Patient coming from home with c/o fell yesterday afternoon. Patient is her for Left hip pain.     Allergies Allergies  Allergen Reactions  . Penicillins Swelling    Has patient had a PCN reaction causing immediate rash, facial/tongue/throat swelling, SOB or lightheadedness with hypotension: Yes Has patient had a PCN reaction causing severe rash involving mucus membranes or skin necrosis: Yes Has patient had a PCN reaction that required hospitalization Yes Has patient had a PCN reaction occurring within the last 10 years: No, more than 10 yrs ago If all of the above answers are "NO", then may proceed with Cephalosporin use.   . Fentanyl Itching  . Peach [Prunus Persica] Hives  . Shellfish Allergy Hives    Level of Care/Admitting Diagnosis ED Disposition    ED Disposition Condition Comment   Admit  Hospital Area: [place] [[id number]]  Level of Care: Telemetry [5]  Admit to tele based on following criteria: Complex arrhythmia (Bradycardia/Tachycardia)  Covid Evaluation: Confirmed COVID Negative  Date Laboratory Confirmed COVID Negative: 05/21/2019  Diagnosis: COPD exacerbation [facility]) [[id number]]  Admitting Physician: Shelly Coss [[id number]]  Attending Physician: Shelly Coss [[id number]]       B Medical/Surgery History Past Medical History:  Diagnosis Date  . Allergic rhinitis   . Anxiety   . Arthritis   . Asthma   . Cancer [facility]) 2006   breast cancer right  . CHF (congestive heart failure) ([place])   . COPD (chronic obstructive pulmonary disease) ([place])   . Degenerative disc  disease, lumbar   . Diabetes mellitus without complication ([place])   . Dyspnea    walking distances  . Eczema   . GERD (gastroesophageal reflux disease)   . Glaucoma   . History of home oxygen therapy    uses 2 liters ay night and prn  . Hyperlipidemia   . Hypertension   . Low back pain   . Lupus ([place])    skin  . Neck pain   . Numbness and tingling   . Obesity   . Osteopenia   . Pulmonary embolism ([place]) 01/2012    CT showed multi small PE and coumadized   . Sleep apnea 2010   no cpap used  . Systemic lupus erythematosus ([place])    Past Surgical History:  Procedure Laterality Date  . ABDOMINAL HYSTERECTOMY  1976  . BACK SURGERY  2006   lower  . BREAST BIOPSY Right   . BREAST EXCISIONAL BIOPSY Left   . BREAST LUMPECTOMY Right   . COLONOSCOPY WITH PROPOFOL N/A 03/23/2015   Procedure: COLONOSCOPY WITH PROPOFOL;  Surgeon: Laurence Spates, MD;  Location: [facility];  Service: Endoscopy;  Laterality: N/A;  . Dobtamine myoview  05/02/2008   EF 67% ; LV norm  . DOPPLER ECHOCARDIOGRAPHY  01/25/2012   EF 55 TO 60%; LV norm.  . EXPLORATORY LAPAROTOMY    . EYE SURGERY Bilateral 2010   lens reaplcments for cataracts   . Lower [place] venous doppler  01/25/2012    neg.  . TOE SURGERY  1996   Bunion     A IV Location/Drains/Wounds Patient Lines/Drains/Airways Status  Active Line/Drains/Airways    Name:   Placement date:   Placement time:   Site:   Days:   External Urinary Catheter   02/15/19    2200    -   95   Pressure Injury 02/21/19 Buttocks Right;Left Stage II -  Partial thickness loss of dermis presenting as a shallow open ulcer with a red, pink wound bed without slough.   02/21/19    0805     89          Intake/Output Last 24 hours No intake or output data in the 24 hours ending 05/21/19 1501  Labs/Imaging Results for orders placed or performed during the hospital encounter of 05/21/19 (from the past 48 hour(s))  Comprehensive metabolic panel     Status: Abnormal    Collection Time: 05/21/19 11:47 AM  Result Value Ref Range   Sodium 141 135 - 145 mmol/L   Potassium 4.5 3.5 - 5.1 mmol/L   Chloride 103 98 - 111 mmol/L   CO2 27 22 - 32 mmol/L   Glucose, Bld 80 70 - 99 mg/dL   BUN 38 (H) 8 - 23 mg/dL   Creatinine, Ser 1.39 (H) 0.44 - 1.00 mg/dL   Calcium 10.1 8.9 - 10.3 mg/dL   Total Protein 8.0 6.5 - 8.1 g/dL   Albumin 3.7 3.5 - 5.0 g/dL   AST 19 15 - 41 U/L   ALT 20 0 - 44 U/L   Alkaline Phosphatase 81 38 - 126 U/L   Total Bilirubin 0.6 0.3 - 1.2 mg/dL   GFR calc non Af Amer 36 (L) >60 mL/min   GFR calc Af Amer 42 (L) >60 mL/min   Anion gap 11 5 - 15    Comment: Performed at [facility], [place] [address], [place], [place] [address]  CBC with Differential     Status: Abnormal   Collection Time: 05/21/19 11:47 AM  Result Value Ref Range   WBC 11.3 (H) 4.0 - 10.5 K/uL   RBC 3.70 (L) 3.87 - 5.11 MIL/uL   Hemoglobin 10.4 (L) 12.0 - 15.0 g/dL   HCT 34.7 (L) 36.0 - 46.0 %   MCV 93.8 80.0 - 100.0 fL   MCH 28.1 26.0 - 34.0 pg   MCHC 30.0 30.0 - 36.0 g/dL   RDW 16.9 (H) 11.5 - 15.5 %   Platelets 342 150 - 400 K/uL   nRBC 0.0 0.0 - 0.2 %   Neutrophils Relative % 82 %   Neutro Abs 9.2 (H) 1.7 - 7.7 K/uL   Lymphocytes Relative 11 %   Lymphs Abs 1.2 0.7 - 4.0 K/uL   Monocytes Relative 5 %   Monocytes Absolute 0.5 0.1 - 1.0 K/uL   Eosinophils Relative 2 %   Eosinophils Absolute 0.2 0.0 - 0.5 K/uL   Basophils Relative 0 %   Basophils Absolute 0.0 0.0 - 0.1 K/uL   Immature Granulocytes 0 %   Abs Immature Granulocytes 0.05 0.00 - 0.07 K/uL    Comment: Performed at [facility], [place] [address] [place], [place] [address]  Troponin I (High Sensitivity)     Status: None   Collection Time: 05/21/19 11:47 AM  Result Value Ref Range   Troponin I (High Sensitivity) 11 <18 ng/L    Comment: (NOTE) Elevated high sensitivity troponin I (hsTnI) values and significant  changes across serial measurements may suggest ACS but  many other  chronic and acute conditions are known to elevate hsTnI results.  Refer to the "Links" section for chest pain algorithms and additional  guidance. Performed at [facility], [place] [address], [place], [place] [address]   Urinalysis, Routine w reflex microscopic     Status: Abnormal   Collection Time: 05/21/19 11:47 AM  Result Value Ref Range   Color, Urine STRAW (A) YELLOW   APPearance CLEAR CLEAR   Specific Gravity, Urine 1.009 1.005 - 1.030   pH 8.0 5.0 - 8.0   Glucose, UA NEGATIVE NEGATIVE mg/dL   Hgb urine dipstick NEGATIVE NEGATIVE   Bilirubin Urine NEGATIVE NEGATIVE   Ketones, ur NEGATIVE NEGATIVE mg/dL   Protein, ur 30 (A) NEGATIVE mg/dL   Nitrite NEGATIVE NEGATIVE   Leukocytes,Ua NEGATIVE NEGATIVE   RBC / HPF 0-5 0 - 5 RBC/hpf   WBC, UA 0-5 0 - 5 WBC/hpf   Bacteria, UA NONE SEEN NONE SEEN   Squamous Epithelial / LPF 0-5 0 - 5    Comment: Performed at [facility], [place] [address], [place], [place] [address]  Respiratory Panel by RT PCR (Flu A&B, Covid) - Urine, Clean Catch     Status: None   Collection Time: 05/21/19 11:47 AM   Specimen: Urine, Clean Catch  Result Value Ref Range   SARS Coronavirus 2 by RT PCR NEGATIVE NEGATIVE    Comment: (NOTE) SARS-CoV-2 target nucleic acids are NOT DETECTED. The SARS-CoV-2 RNA is generally detectable in upper respiratoy specimens during the acute phase of infection. The lowest concentration of SARS-CoV-2 viral copies this assay can detect is 131 copies/mL. A negative result does not preclude SARS-Cov-2 infection and should not be used as the sole basis for treatment or other patient management decisions. A negative result may occur with  improper specimen collection/handling, submission of specimen other than nasopharyngeal swab, presence of viral mutation(s) within the areas targeted by this assay, and inadequate number of viral copies (<131 copies/mL). A negative result must be  combined with clinical observations, patient history, and epidemiological information. The expected result is Negative. Fact Sheet for Patients:  PinkCheek.be Fact Sheet for Healthcare Providers:  GravelBags.it This test is not yet ap proved or cleared by the [place] FDA and  has been authorized for detection and/or diagnosis of SARS-CoV-2 by FDA under an Emergency Use Authorization (EUA). This EUA will remain  in effect (meaning this test can be used) for the duration of the COVID-19 declaration under Section 564(b)(1) of the Act, 21 U.S.C. section 360bbb-3(b)(1), unless the authorization is terminated or revoked sooner.    Influenza A by PCR NEGATIVE NEGATIVE   Influenza B by PCR NEGATIVE NEGATIVE    Comment: (NOTE) The Xpert Xpress SARS-CoV-2/FLU/RSV assay is intended as an aid in  the diagnosis of influenza from Nasopharyngeal swab specimens and  should not be used as a sole basis for treatment. Nasal washings and  aspirates are unacceptable for Xpert Xpress SARS-CoV-2/FLU/RSV  testing. Fact Sheet for Patients: PinkCheek.be Fact Sheet for Healthcare Providers: GravelBags.it This test is not yet approved or cleared by the [place] FDA and  has been authorized for detection and/or diagnosis of SARS-CoV-2 by  FDA under an Emergency Use Authorization (EUA). This EUA will remain  in effect (meaning this test can be used) for the duration of the  Covid-19 declaration under Section 564(b)(1) of the Act, 21  U.S.C. section 360bbb-3(b)(1), unless the authorization is  terminated or revoked. Performed at [facility], [place] [address] [place], [place] [address]    DG Chest  Portable 1 View  Result Date: 05/21/2019 CLINICAL DATA:  [place] yesterday, COPD, CHF, short of breath EXAM: PORTABLE CHEST 1 VIEW COMPARISON:  04/19/2019 FINDINGS: Single  frontal view of the chest demonstrates a stable cardiac silhouette. There is stable diffuse interstitial prominence throughout the lungs. Chronic central vascular congestion without focal airspace disease. No large effusion or pneumothorax. IMPRESSION: 1. Continued interstitial scarring and central vascular congestion. No acute airspace disease. Electronically Signed   By: Randa Ngo M.D.   On: 05/21/2019 11:53   DG Knee Complete 4 Views Left  Result Date: 05/21/2019 CLINICAL DATA:  [place] yesterday, severe left hip and knee pain EXAM: LEFT KNEE - COMPLETE 4+ VIEW COMPARISON:  None FINDINGS: Frontal, oblique, lateral views of the left knee demonstrate no fracture, subluxation, or dislocation. Three compartmental osteoarthritis greatest in the medial compartment. No joint effusion. IMPRESSION: 1. Osteoarthritis, no acute fracture. Electronically Signed   By: Randa Ngo M.D.   On: 05/21/2019 11:51   DG Hip Unilat W or Wo Pelvis 2-3 Views Left  Result Date: 05/21/2019 CLINICAL DATA:  [place] yesterday, severe left hip and knee pain EXAM: DG HIP (WITH OR WITHOUT PELVIS) 2-3V LEFT COMPARISON:  07/20/2012 FINDINGS: Frontal views of the pelvis as well as frontal and frogleg lateral views of the left hip are obtained. Evaluation is slightly limited due to technique and patient body habitus. Continued right greater than left hip osteoarthritis, with progression of the axial joint space narrowing within the right hip seen previously. No acute displaced fracture. Alignment is anatomic. Soft tissues are unremarkable. IMPRESSION: 1. No acute displaced hip fracture. 2. Right greater than left hip osteoarthritis. Electronically Signed   By: Randa Ngo M.D.   On: 05/21/2019 11:49    Pending Labs Unresulted Labs (From admission, onward)    Start     Ordered   05/22/19 8341  Basic metabolic panel  Tomorrow morning,   R     05/21/19 1422   05/22/19 0500  CBC  Tomorrow morning,   R     05/21/19 1422           Vitals/Pain Today's Vitals   05/21/19 1330 05/21/19 1345 05/21/19 1400 05/21/19 1416  BP: (!) 179/88  (!) 166/80   Pulse: 89 94 87   Resp: (!) 21 (!) 22 18   Temp:      TempSrc:      SpO2: 98% 95% 98%   Weight:      Height:      PainSc:    7     Isolation Precautions No active isolations  Medications Medications  acetaminophen (TYLENOL) tablet 500 mg (has no administration in time range)  allopurinol (ZYLOPRIM) tablet 100 mg (has no administration in time range)  aspirin EC tablet 81 mg (has no administration in time range)  traMADol (ULTRAM) tablet 50 mg (has no administration in time range)  diltiazem (CARDIZEM CD) 24 hr capsule 300 mg (has no administration in time range)  furosemide (LASIX) tablet 40 mg (has no administration in time range)  hydrALAZINE (APRESOLINE) tablet 50 mg (has no administration in time range)  isosorbide mononitrate (IMDUR) 24 hr tablet 30 mg (has no administration in time range)  lisinopril-hydrochlorothiazide (ZESTORETIC) 20-12.5 MG per tablet 1 tablet (has no administration in time range)  metoprolol tartrate (LOPRESSOR) tablet 50 mg (has no administration in time range)  pravastatin (PRAVACHOL) tablet 40 mg (has no administration in time range)  citalopram (CELEXA) tablet 10 mg (has no administration in time range)  insulin aspart (novoLOG) injection 10 Units (has no administration in time range)  insulin glargine (LANTUS) injection 40 Units (has no administration in time range)  pantoprazole (PROTONIX) EC tablet 40 mg (has no administration in time range)  vitamin B-12 (CYANOCOBALAMIN) tablet 1,000 mcg (has no administration in time range)  gabapentin (NEURONTIN) capsule 300 mg (has no administration in time range)  mometasone-formoterol (DULERA) 200-5 MCG/ACT inhaler 2 puff (has no administration in time range)  heparin injection 5,000 Units (has no administration in time range)  morphine 4 MG/ML injection 4 mg (4 mg Intravenous Given  05/21/19 1142)  albuterol (VENTOLIN HFA) 108 (90 Base) MCG/ACT inhaler 4 puff (4 puffs Inhalation Given 05/21/19 1142)  methylPREDNISolone sodium succinate (SOLU-MEDROL) 125 mg/2 mL injection 125 mg (125 mg Intravenous Given 05/21/19 1142)  ipratropium-albuterol (DUONEB) 0.5-2.5 (3) MG/3ML nebulizer solution 3 mL (3 mLs Nebulization Given 05/21/19 1415)    Mobility walks with device Low fall risk   Focused Assessments .   R Recommendations: See Admitting Provider Note  Report given to:   Additional Notes: n/a

## 2019-05-21 NOTE — ED Notes (Signed)
[name] [name], daughter would like an update, [phone].

## 2019-05-21 NOTE — ED Triage Notes (Signed)
Patient coming from home with c/o fell yesterday afternoon. Patient is her for Left hip pain.

## 2019-05-21 NOTE — TOC Initial Note (Signed)
Transition of Care [facility]) - Initial/Assessment Note    Patient Details  Name: [name] [name] MRN: [id number] Date of Birth: July 25, 1940  Transition of Care [facility]) CM/SW Contact:    Erenest Rasher, RN Phone Number: [phone] 05/21/2019, 6:11 PM  Clinical Narrative:                 Spoke to pt and states she has a caregiver, Anderson Malta that comes 5 days per week and sometimes she will spend the night. States she pays for out of pocket. She is active with [facility] for HHPT/OT/RN. She has RW, wheelchair, oxygen Huey Romans), and nebulizer machine. States her caregiver, Anderson Malta will provide transportation, explained to bring portable oxygen tank at dc.  Will need resumption of care orders for [facility], PT and OT.     Expected Discharge Plan: [place] Barriers to Discharge: Continued Medical Work up   Patient Goals and CMS Choice Patient states their goals for this hospitalization and ongoing recovery are:: prefers to return home with [facility] CMS Medicare.gov Compare Post Acute Care list provided to:: Patient Choice offered to / list presented to : Patient  Expected Discharge Plan and Services Expected Discharge Plan: [place] In-house Referral: Clinical Social Work Discharge Planning Services: CM Consult Post Acute Care Choice: [place] arrangements for the past 2 months: [place] Arranged: RN, PT, OT HH Agency: [facility] Date [facility] Agency Contacted: 05/21/19 Time [place]: [id number] Representative spoke with at [place]: Lyda Kalata  Prior Living Arrangements/Services Living arrangements for the past 2 months: [place] with:: Self Patient language and need for interpreter reviewed:: Yes Do you feel safe going back to the place where you live?: Yes      Need for Family Participation in Patient Care: Yes (Comment) Care giver support system in place?: Yes  (comment) Current home services: DME(rolling walker, wheelchair, oxygen, nebulizer machine) Criminal Activity/Legal Involvement Pertinent to Current Situation/Hospitalization: No - Comment as needed  Activities of Daily Living Home Assistive Devices/Equipment: Shower chair with back, Oxygen, Nebulizer, Wheelchair, Other (Comment), Eyeglasses, CBG Meter, Reacher, Blood pressure cuff, Scales, Hand-held shower hose(manual wheelchair, tub/shower unit, pulse oximeter) ADL Screening (condition at time of admission) Patient's cognitive ability adequate to safely complete daily activities?: Yes Is the patient deaf or have difficulty hearing?: No Does the patient have difficulty seeing, even when wearing glasses/contacts?: No Does the patient have difficulty concentrating, remembering, or making decisions?: No Patient able to express need for assistance with ADLs?: Yes Does the patient have difficulty dressing or bathing?: Yes Independently performs ADLs?: No Communication: Independent Dressing (OT): Needs assistance Is this a change from baseline?: Pre-admission baseline Grooming: Needs assistance Is this a change from baseline?: Pre-admission baseline Feeding: Needs assistance Is this a change from baseline?: Pre-admission baseline Bathing: Needs assistance Is this a change from baseline?: Pre-admission baseline Toileting: Needs assistance Is this a change from baseline?: Pre-admission baseline In/Out Bed: Needs assistance Is this a change from baseline?: Pre-admission baseline Walks in Home: Needs assistance Is this a change from baseline?: Pre-admission baseline Does the patient have difficulty walking or climbing stairs?: Yes(secondary to weakness) Weakness of Legs: Both Weakness of Arms/Hands: None  Permission Sought/Granted Permission sought to share information with : Case Manager Permission granted to share information with :  Yes, Verbal Permission Granted  Share Information with  NAME: Leisa Lenz  Permission granted to share info w AGENCY: [facility]  Permission granted to share info w Relationship: caregiver  Permission granted to share info w Contact Information: [phone]  Emotional Assessment   Attitude/Demeanor/Rapport: Engaged Affect (typically observed): Accepting Orientation: : Oriented to Self, Oriented to Place, Oriented to Situation, Oriented to  Time   Psych Involvement: No (comment)  Admission diagnosis:  Left leg pain [M79.605] COPD exacerbation ([place]) [J44.1] Fall, initial encounter [W19.XXXA] Patient Active Problem List   Diagnosis Date Noted  . Fall at home, initial encounter 05/21/2019  . COPD exacerbation ([place]) 05/21/2019  . Pressure injury of skin 02/21/2019  . Acute respiratory disease due to COVID-19 virus 02/16/2019  . Acute on chronic respiratory failure with hypoxia ([place]) 02/15/2019  . Acute-on-chronic kidney injury ([place]) 02/15/2019  . Sepsis ([place]) 02/15/2019  . Obesity (BMI 30-39.9) 10/31/2018  . CRI (chronic renal insufficiency), stage 3 (moderate) 10/31/2018  . Iron deficiency anemia 04/10/2018  . 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 Abnormality of gait 08/05/2015  . Spinal stenosis of lumbar region 08/05/2015  . Peripheral neuropathy 08/05/2015  . Chest pain 12/28/2013  . Hyperlipidemia associated with type 2 diabetes mellitus ([place]) 04/05/2013  . Systemic lupus erythematosus ([place]) 04/05/2013  . Pulmonary embolism ([place]) 01/24/2012  . Chronic diastolic CHF (congestive heart failure) ([place]) 01/24/2012  . COPD (chronic obstructive pulmonary disease) ([place]) 01/24/2012  . Diabetes mellitus ([place]) 01/24/2012  . Hypertension associated with diabetes ([place]) 01/24/2012  . Leukocytosis 01/24/2012  . Bronchitis 01/24/2012  . Anemia 01/24/2012   PCP:  Nolene Ebbs, MD Pharmacy:   [facility], [place] [address] Phone: [phone]  Fax: [phone]     Social Determinants of Health (SDOH) Interventions    Readmission Risk Interventions No flowsheet data found.

## 2019-05-21 NOTE — ED Notes (Signed)
purewick placed on patient.  

## 2019-05-21 NOTE — ED Provider Notes (Signed)
Emergency Department Provider Note   I have reviewed the triage vital signs and the nursing notes.   HISTORY  Chief Complaint No chief complaint on file.   HPI [name] [name] is a 79 y.o. female with PMH of CHF, COPD on 2L home O2, HLD, HTN, and asthma presents to the ED with left hip and leg pain after fall yesterday.  Patient states that she tripped over her oxygen canister and fell to the ground.  She denies presyncopal symptoms or chest pain.  Patient has developed some shortness of breath since yesterday with wheezing.  She has been using her home nebulizer with minimal relief in symptoms.  She did have subjective fever at home this morning.  She reports history of COVID-19 infection in November but made a full recovery.  She continues to have pain in the left hip from her fall and so called EMS.   Past Medical History:  Diagnosis Date  . Allergic rhinitis   . Anxiety   . Arthritis   . Asthma   . Cancer [facility]) 2006   breast cancer right  . CHF (congestive heart failure) ([place])   . COPD (chronic obstructive pulmonary disease) ([place])   . Degenerative disc disease, lumbar   . Diabetes mellitus without complication ([place])   . Dyspnea    walking distances  . Eczema   . GERD (gastroesophageal reflux disease)   . Glaucoma   . History of home oxygen therapy    uses 2 liters ay night and prn  . Hyperlipidemia   . Hypertension   . Low back pain   . Lupus ([place])    skin  . Neck pain   . Numbness and tingling   . Obesity   . Osteopenia   . Pulmonary embolism ([place]) 01/2012    CT showed multi small PE and coumadized   . Sleep apnea 2010   no cpap used  . Systemic lupus erythematosus ([place])     Patient Active Problem List   Diagnosis Date Noted  . Fall at home, initial encounter 05/21/2019  . COPD exacerbation ([place]) 05/21/2019  . Pressure injury of skin 02/21/2019  . Acute respiratory disease due to COVID-19 virus 02/16/2019  . Acute on chronic respiratory failure with  hypoxia ([place]) 02/15/2019  . Acute-on-chronic kidney injury ([place]) 02/15/2019  . Sepsis ([place]) 02/15/2019  . Obesity (BMI 30-39.9) 10/31/2018  . CRI (chronic renal insufficiency), stage 3 (moderate) 10/31/2018  . Iron deficiency anemia 04/10/2018  . 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 Abnormality of gait 08/05/2015  . Spinal stenosis of lumbar region 08/05/2015  . Peripheral neuropathy 08/05/2015  . Chest pain 12/28/2013  . Hyperlipidemia associated with type 2 diabetes mellitus ([place]) 04/05/2013  . Systemic lupus erythematosus ([place]) 04/05/2013  . Pulmonary embolism ([place]) 01/24/2012  . Chronic diastolic CHF (congestive heart failure) ([place]) 01/24/2012  . COPD (chronic obstructive pulmonary disease) ([place]) 01/24/2012  . Diabetes mellitus ([place]) 01/24/2012  . Hypertension associated with diabetes ([place]) 01/24/2012  . Leukocytosis 01/24/2012  . Bronchitis 01/24/2012  . Anemia 01/24/2012    Past Surgical History:  Procedure Laterality Date  . ABDOMINAL HYSTERECTOMY  1976  . BACK SURGERY  2006   lower  . BREAST BIOPSY Right   . BREAST EXCISIONAL BIOPSY Left   . BREAST LUMPECTOMY Right   . COLONOSCOPY WITH PROPOFOL N/A 03/23/2015   Procedure: COLONOSCOPY WITH PROPOFOL;  Surgeon: Laurence Spates, MD;  Location: [facility] ENDOSCOPY;  Service: Endoscopy;  Laterality: N/A;  .  Dobtamine myoview  05/02/2008   EF 67% ; LV norm  . DOPPLER ECHOCARDIOGRAPHY  01/25/2012   EF 55 TO 60%; LV norm.  . EXPLORATORY LAPAROTOMY    . EYE SURGERY Bilateral 2010   lens reaplcments for cataracts   . Lower Extrem. venous doppler  01/25/2012    neg.  . TOE SURGERY  1996   Bunion    Allergies Penicillins, Fentanyl, Peach [prunus persica], and Shellfish allergy  Family History  Problem Relation Age of Onset  . Heart failure Father   . Heart failure Mother   . Diabetes Brother   . Breast cancer Maternal Aunt   . Breast cancer Paternal Aunt     Social History Social History   Tobacco Use  .  Smoking status: Former Research scientist (life sciences)  . Smokeless tobacco: Never Used  . Tobacco comment: Quit 1980  Substance Use Topics  . Alcohol use: No    Alcohol/week: 0.0 standard drinks  . Drug use: No    Review of Systems  Constitutional: Positive subjective fever this AM.  Eyes: No visual changes. ENT: No sore throat. Cardiovascular: Denies chest pain. Respiratory: Positive shortness of breath. Gastrointestinal: No abdominal pain.  No nausea, no vomiting.  No diarrhea.  No constipation. Genitourinary: Negative for dysuria. Musculoskeletal: Negative for back pain. Positive left hip and knee pain.  Skin: Negative for rash. Neurological: Negative for headaches, focal weakness or numbness.  10-point ROS otherwise negative.  ____________________________________________   PHYSICAL EXAM:  VITAL SIGNS: ED Triage Vitals  Enc Vitals Group     BP 05/21/19 1026 (!) 183/94     Pulse Rate 05/21/19 1026 (!) 101     Resp 05/21/19 1026 (!) 25     Temp 05/21/19 1026 98.8 F (37.1 C)     Temp Source 05/21/19 1026 Oral     SpO2 05/21/19 1026 94 %     Weight 05/21/19 1041 220 lb (99.8 kg)     Height 05/21/19 1041 '5\' 4"'$  (1.626 m)   Constitutional: Alert and oriented. Moderate tachypnea noted but able to provide a full history with brief 3-4 word sentences.  Eyes: Conjunctivae are normal.  Head: Atraumatic. Nose: No congestion/rhinnorhea. Mouth/Throat: Mucous membranes are moist.   Neck: No stridor.  Cardiovascular: Tachycardia. Good peripheral circulation. Grossly normal heart sounds.   Respiratory: Increased respiratory effort.  No retractions. Lungs with bilateral end-expiratory wheezing.  Gastrointestinal: Soft and nontender. No distention.  Musculoskeletal: Trace pitting edema in the bilateral LEs. Pain with passive ROM of the left knee and hip. No deformity, malrotation, or shortening.  Neurologic:  Normal speech and language. No gross focal neurologic deficits are appreciated.   Skin:  Skin  is warm, dry and intact. No rash noted.  ____________________________________________   LABS (all labs ordered are listed, but only abnormal results are displayed)  Labs Reviewed  COMPREHENSIVE METABOLIC PANEL - Abnormal; Notable for the following components:      Result Value   BUN 38 (*)    Creatinine, Ser 1.39 (*)    GFR calc non Af Amer 36 (*)    GFR calc Af Amer 42 (*)    All other components within normal limits  CBC WITH DIFFERENTIAL/PLATELET - Abnormal; Notable for the following components:   WBC 11.3 (*)    RBC 3.70 (*)    Hemoglobin 10.4 (*)    HCT 34.7 (*)    RDW 16.9 (*)    Neutro Abs 9.2 (*)    All other components within normal limits  URINALYSIS, ROUTINE W REFLEX MICROSCOPIC - Abnormal; Notable for the following components:   Color, Urine STRAW (*)    Protein, ur 30 (*)    All other components within normal limits  RESPIRATORY PANEL BY RT PCR (FLU A&B, COVID)  HEMOGLOBIN A1C  TROPONIN I (HIGH SENSITIVITY)  TROPONIN I (HIGH SENSITIVITY)   ____________________________________________  EKG   EKG Interpretation  Date/Time:  Tuesday May 21 2019 11:01:28 EST Ventricular Rate:  104 PR Interval:    QRS Duration: 131 QT Interval:  382 QTC Calculation: 503 R Axis:   -78 Text Interpretation: Sinus tachycardia RBBB and LAFB Minimal ST elevation, lateral leads Similar to Oct 2020 tracing. No STEMI. Confirmed by Nanda Quinton [phone]) on 05/21/2019 11:26:12 AM       ____________________________________________  RADIOLOGY  DG Chest Portable 1 View  Result Date: 05/21/2019 CLINICAL DATA:  [place] yesterday, COPD, CHF, short of breath EXAM: PORTABLE CHEST 1 VIEW COMPARISON:  04/19/2019 FINDINGS: Single frontal view of the chest demonstrates a stable cardiac silhouette. There is stable diffuse interstitial prominence throughout the lungs. Chronic central vascular congestion without focal airspace disease. No large effusion or pneumothorax. IMPRESSION: 1. Continued  interstitial scarring and central vascular congestion. No acute airspace disease. Electronically Signed   By: Randa Ngo M.D.   On: 05/21/2019 11:53   DG Knee Complete 4 Views Left  Result Date: 05/21/2019 CLINICAL DATA:  [place] yesterday, severe left hip and knee pain EXAM: LEFT KNEE - COMPLETE 4+ VIEW COMPARISON:  None FINDINGS: Frontal, oblique, lateral views of the left knee demonstrate no fracture, subluxation, or dislocation. Three compartmental osteoarthritis greatest in the medial compartment. No joint effusion. IMPRESSION: 1. Osteoarthritis, no acute fracture. Electronically Signed   By: Randa Ngo M.D.   On: 05/21/2019 11:51   DG Hip Unilat W or Wo Pelvis 2-3 Views Left  Result Date: 05/21/2019 CLINICAL DATA:  [place] yesterday, severe left hip and knee pain EXAM: DG HIP (WITH OR WITHOUT PELVIS) 2-3V LEFT COMPARISON:  07/20/2012 FINDINGS: Frontal views of the pelvis as well as frontal and frogleg lateral views of the left hip are obtained. Evaluation is slightly limited due to technique and patient body habitus. Continued right greater than left hip osteoarthritis, with progression of the axial joint space narrowing within the right hip seen previously. No acute displaced fracture. Alignment is anatomic. Soft tissues are unremarkable. IMPRESSION: 1. No acute displaced hip fracture. 2. Right greater than left hip osteoarthritis. Electronically Signed   By: Randa Ngo M.D.   On: 05/21/2019 11:49    ____________________________________________   PROCEDURES  Procedure(s) performed:   Procedures  CRITICAL CARE Performed by: Margette Fast Total critical care time: 35 minutes Critical care time was exclusive of separately billable procedures and treating other patients. Critical care was necessary to treat or prevent imminent or life-threatening deterioration. Critical care was time spent personally by me on the following activities: development of treatment plan with patient and/or  surrogate as well as nursing, discussions with consultants, evaluation of patient's response to treatment, examination of patient, obtaining history from patient or surrogate, ordering and performing treatments and interventions, ordering and review of laboratory studies, ordering and review of radiographic studies, pulse oximetry and re-evaluation of patient's condition.  Nanda Quinton, MD Emergency Medicine  ____________________________________________   INITIAL IMPRESSION / ASSESSMENT AND PLAN / ED COURSE  Pertinent labs & imaging results that were available during my care of the patient were reviewed by me and considered in my medical decision making (see  chart for details).   Patient presents to the emergency department evaluation of left hip pain after mechanical fall yesterday.  She does report fever this morning but afebrile on arrival.  She does have increased work of breathing with wheezing.  Seems most consistent with COPD exacerbation.  Patient with Covid diagnosis greater than 3 months prior.  Will repeat PCR test here in hopes of possibly giving nebulizer treatments.  Doubt recurrent Covid infection.  Doubt PE clinically as patient is not having chest pain and I do hear wheezing on exam. Plain films of the left hip and knee pending.   Plain films of the leg and chest reviewed.  No acute findings.  Patient's Covid test is negative.  Will give DuoNeb with continued tachypnea and increased oxygen requirement now on 3 L nasal cannula.  She is not requiring additional airway support at this time.  Solu-Medrol given.  Plan for admit for COPD exacerbation along with PT/OT evaluation of left leg pain after fall.   Discussed patient's case with [facility] to request admission. Patient and family (if present) updated with plan. Care transferred to [facility] service.  I reviewed all nursing notes, vitals, pertinent old records, EKGs, labs, imaging (as  available).  ____________________________________________  FINAL CLINICAL IMPRESSION(S) / ED DIAGNOSES  Final diagnoses:  COPD exacerbation ( [place])  Left leg pain  Fall, initial encounter     MEDICATIONS GIVEN DURING THIS VISIT:  Medications  acetaminophen (TYLENOL) tablet 500 mg (has no administration in time range)  allopurinol (ZYLOPRIM) tablet 100 mg (has no administration in time range)  aspirin EC tablet 81 mg (has no administration in time range)  traMADol (ULTRAM) tablet 50 mg (has no administration in time range)  diltiazem (CARDIZEM CD) 24 hr capsule 300 mg (has no administration in time range)  furosemide (LASIX) tablet 40 mg (has no administration in time range)  hydrALAZINE (APRESOLINE) tablet 50 mg (has no administration in time range)  isosorbide mononitrate (IMDUR) 24 hr tablet 30 mg (has no administration in time range)  lisinopril-hydrochlorothiazide (ZESTORETIC) 20-12.5 MG per tablet 1 tablet (has no administration in time range)  metoprolol tartrate (LOPRESSOR) tablet 50 mg (has no administration in time range)  pravastatin (PRAVACHOL) tablet 40 mg (has no administration in time range)  citalopram (CELEXA) tablet 10 mg (has no administration in time range)  insulin aspart (novoLOG) injection 10 Units (has no administration in time range)  insulin glargine (LANTUS) injection 40 Units (has no administration in time range)  pantoprazole (PROTONIX) EC tablet 40 mg (has no administration in time range)  vitamin B-12 (CYANOCOBALAMIN) tablet 1,000 mcg (has no administration in time range)  gabapentin (NEURONTIN) capsule 300 mg (has no administration in time range)  mometasone-formoterol (DULERA) 200-5 MCG/ACT inhaler 2 puff (has no administration in time range)  heparin injection 5,000 Units (has no administration in time range)  ipratropium-albuterol (DUONEB) 0.5-2.5 (3) MG/3ML nebulizer solution 3 mL (has no administration in time range)  methylPREDNISolone sodium  succinate (SOLU-MEDROL) 40 mg/mL injection 40 mg (has no administration in time range)  insulin aspart (novoLOG) injection 0-5 Units (has no administration in time range)  insulin aspart (novoLOG) injection 0-20 Units (has no administration in time range)  azithromycin (ZITHROMAX) tablet 500 mg (has no administration in time range)  guaiFENesin (MUCINEX) 12 hr tablet 1,200 mg (has no administration in time range)  labetalol (NORMODYNE) injection 10 mg (has no administration in time range)  morphine 4 MG/ML injection 4 mg (4 mg Intravenous Given 05/21/19 1142)  albuterol (  VENTOLIN HFA) 108 (90 Base) MCG/ACT inhaler 4 puff (4 puffs Inhalation Given 05/21/19 1142)  methylPREDNISolone sodium succinate (SOLU-MEDROL) 125 mg/2 mL injection 125 mg (125 mg Intravenous Given 05/21/19 1142)  ipratropium-albuterol (DUONEB) 0.5-2.5 (3) MG/3ML nebulizer solution 3 mL (3 mLs Nebulization Given 05/21/19 1415)    Note:  This document was prepared using Dragon voice recognition software and may include unintentional dictation errors.  Nanda Quinton, MD, [facility] Emergency Medicine    Imojean Yoshino, [facility], MD 05/21/19 [phone]

## 2019-05-22 DIAGNOSIS — E1142 Type 2 diabetes mellitus with diabetic polyneuropathy: Secondary | ICD-10-CM

## 2019-05-22 DIAGNOSIS — N1832 Chronic kidney disease, stage 3b: Secondary | ICD-10-CM

## 2019-05-22 DIAGNOSIS — J441 Chronic obstructive pulmonary disease with (acute) exacerbation: Secondary | ICD-10-CM | POA: Diagnosis not present

## 2019-05-22 DIAGNOSIS — Y92009 Unspecified place in unspecified non-institutional (private) residence as the place of occurrence of the external cause: Secondary | ICD-10-CM

## 2019-05-22 DIAGNOSIS — W19XXXA Unspecified fall, initial encounter: Secondary | ICD-10-CM

## 2019-05-22 DIAGNOSIS — J9621 Acute and chronic respiratory failure with hypoxia: Secondary | ICD-10-CM | POA: Diagnosis not present

## 2019-05-22 DIAGNOSIS — E785 Hyperlipidemia, unspecified: Secondary | ICD-10-CM

## 2019-05-22 DIAGNOSIS — I5032 Chronic diastolic (congestive) heart failure: Secondary | ICD-10-CM

## 2019-05-22 DIAGNOSIS — I10 Essential (primary) hypertension: Secondary | ICD-10-CM

## 2019-05-22 DIAGNOSIS — Z794 Long term (current) use of insulin: Secondary | ICD-10-CM

## 2019-05-22 LAB — FOLATE: Folate: 10 ng/mL (ref >5.9)

## 2019-05-22 LAB — CBC
HCT: 30.5 % — ABNORMAL LOW (ref 36.0–46.0)
Hemoglobin: 9.2 g/dL — ABNORMAL LOW (ref 12.0–15.0)
MCH: 28 pg (ref 26.0–34.0)
MCHC: 30.2 g/dL (ref 30.0–36.0)
MCV: 92.7 fL (ref 80.0–100.0)
Platelets: 292 10*3/uL (ref 150–400)
RBC: 3.29 MIL/uL — ABNORMAL LOW (ref 3.87–5.11)
RDW: 16.7 % — ABNORMAL HIGH (ref 11.5–15.5)
WBC: 6.3 10*3/uL (ref 4.0–10.5)
nRBC: 0 % (ref 0.0–0.2)

## 2019-05-22 LAB — BASIC METABOLIC PANEL
Anion gap: 8 (ref 5–15)
BUN: 44 mg/dL — ABNORMAL HIGH (ref 8–23)
CO2: 28 mmol/L (ref 22–32)
Calcium: 9.7 mg/dL (ref 8.9–10.3)
Chloride: 103 mmol/L (ref 98–111)
Creatinine, Ser: 1.38 mg/dL — ABNORMAL HIGH (ref 0.44–1.00)
GFR calc Af Amer: 43 mL/min — ABNORMAL LOW (ref >60)
GFR calc non Af Amer: 37 mL/min — ABNORMAL LOW (ref >60)
Glucose, Bld: 176 mg/dL — ABNORMAL HIGH (ref 70–99)
Potassium: 5.1 mmol/L (ref 3.5–5.1)
Sodium: 139 mmol/L (ref 135–145)

## 2019-05-22 LAB — IRON AND TIBC
Iron: 47 ug/dL (ref 28–170)
Saturation Ratios: 19 % (ref 10.4–31.8)
TIBC: 245 ug/dL — ABNORMAL LOW (ref 250–450)
UIBC: 198 ug/dL

## 2019-05-22 LAB — GLUCOSE, CAPILLARY
Glucose-Capillary: 157 mg/dL — ABNORMAL HIGH (ref 70–99)
Glucose-Capillary: 280 mg/dL — ABNORMAL HIGH (ref 70–99)
Glucose-Capillary: 113 mg/dL — ABNORMAL HIGH (ref 70–99)
Glucose-Capillary: 126 mg/dL — ABNORMAL HIGH (ref 70–99)

## 2019-05-22 LAB — FERRITIN: Ferritin: 280 ng/mL (ref 11–307)

## 2019-05-22 LAB — VITAMIN B12: Vitamin B-12: 930 pg/mL — ABNORMAL HIGH (ref 180–914)

## 2019-05-22 MED ORDER — INSULIN ASPART 100 UNIT/ML ~~LOC~~ SOLN
5.0000 [IU] | Freq: Three times a day (TID) | SUBCUTANEOUS | Status: DC
  Administered 2019-05-23 – 2019-05-24 (×4): 5 [IU] via SUBCUTANEOUS

## 2019-05-22 MED ORDER — FLUTICASONE PROPIONATE 50 MCG/ACT NA SUSP
2.0000 | Freq: Every day | NASAL | Status: DC
  Administered 2019-05-22 – 2019-05-24 (×3): 2 via NASAL
  Filled 2019-05-22: qty 16

## 2019-05-22 MED ORDER — IPRATROPIUM-ALBUTEROL 0.5-2.5 (3) MG/3ML IN SOLN
3.0000 mL | Freq: Three times a day (TID) | RESPIRATORY_TRACT | Status: DC
  Administered 2019-05-23: 3 mL via RESPIRATORY_TRACT
  Filled 2019-05-22: qty 3

## 2019-05-22 MED ORDER — LORATADINE 10 MG PO TABS
10.0000 mg | ORAL_TABLET | Freq: Every day | ORAL | Status: DC
  Administered 2019-05-22 – 2019-05-24 (×3): 10 mg via ORAL
  Filled 2019-05-22 (×3): qty 1

## 2019-05-22 NOTE — Evaluation (Signed)
Occupational Therapy Evaluation Patient Details Name: MARQUITTA PERSICHETTI MRN: [id number] DOB: 09/19/1940 Today's Date: 05/22/2019    History of Present Illness 79 yo female admitted with acute on chronic respiratory failure, fall at home. Hx of COPD-O2 dep, CKD, breast ca, mod-severe OA, lupus, obesity, asthma, carpal tunnel syndrome, COVID 19 02/2019   Clinical Impression   Pt was admitted for the above.  At baseline, she has a PCA, whom she reports stays 24/7.  Pt states caregiver's husband is a Airline pilot, and he has come over to help her transfer when she couldn't get up.  Typically, she is able to get in/out of hospital bed on her own.  The aide assists with bathing and dressing. Will follow in acute setting with min A level goals for mobility which supports her ADls as well as for toilet transfers.    Follow Up Recommendations  Home health OT;Supervision/Assistance - 24 hour(pt refusing snf; would benefit)    Equipment Recommendations  None recommended by OT    Recommendations for Other Services       Precautions / Restrictions Precautions Precautions: Fall Precaution Comments: O2 dep-monitor O2 Restrictions Weight Bearing Restrictions: No      Mobility Bed Mobility Overal bed mobility: Needs Assistance Bed Mobility: Supine to Sit     Supine to sit: Mod assist;HOB elevated Sit to supine: Mod assist   General bed mobility comments: Assist for trunk and to scoot to EOB. Increased time. Utilized bedpad to aid with scooting, positioning. Rest break required after bed mobility.  Transfers Overall transfer level: Needs assistance Equipment used: Rolling walker (2 wheeled) Transfers: Sit to/from Stand Sit to Stand: Max assist;+2 physical assistance         General transfer comment: from recliner. Max +2 assist to come to standing.  Min A for pivot to bed    Balance Overall balance assessment: Needs assistance;History of Falls         Standing balance support:  Bilateral upper extremity supported Standing balance-Leahy Scale: Poor                             ADL either performed or assessed with clinical judgement   ADL Overall ADL's : Needs assistance/impaired Eating/Feeding: Independent   Grooming: Set up   Upper Body Bathing: Minimal assistance   Lower Body Bathing: Maximal assistance   Upper Body Dressing : Minimal assistance   Lower Body Dressing: Total assistance   Toilet Transfer: Maximal assistance;+2 for physical assistance;Stand-pivot;RW(from recliner to bed)   Toileting- Clothing Manipulation and Hygiene: Maximal assistance;+2 for safety/equipment         General ADL Comments: could not assist pt out of recliner with +1 A. RN assisted.  max +2 to stand and min A for SPT     Vision         Perception     Praxis      Pertinent Vitals/Pain Faces Pain Scale: Hurts even more Pain Location: bil LEs Pain Descriptors / Indicators: Discomfort;Sore;Aching Pain Intervention(s): Limited activity within patient's tolerance;Monitored during session;Repositioned     Hand Dominance     Extremity/Trunk Assessment Upper Extremity Assessment Upper Extremity Assessment: RUE deficits/detail;LUE deficits/detail RUE Deficits / Details: states she needs sx for rotator cuff; able to lift to 90 FF   Lower Extremity Assessment Lower Extremity Assessment: Generalized weakness   Cervical / Trunk Assessment Cervical / Trunk Assessment: Kyphotic   Communication     Cognition Arousal/Alertness: Awake/alert Behavior  During Therapy: WFL for tasks assessed/performed Overall Cognitive Status: Within Functional Limits for tasks assessed                                     General Comments  pt on 3 liters 02; uses 2 at baseline. Tends to hold breath    Exercises     Shoulder Instructions      Home Living Family/patient expects to be discharged to:: Private residence Living Arrangements:  Alone Available Help at Discharge: Personal care attendant Type of Home: Apartment             Bathroom Shower/Tub: Teacher, early years/pre: Standard     Home Equipment: Environmental consultant - 4 wheels;Walker - 2 wheels;Shower seat;Wheelchair - manual;Cane - single point;Hospital bed;Bedside commode   Additional Comments: states her caregiver stays 24/7      Prior Functioning/Environment      ADL's / Homemaking Assistance Needed: aide assists with adls            OT Problem List: Decreased strength;Decreased activity tolerance;Impaired balance (sitting and/or standing);Pain;Cardiopulmonary status limiting activity      OT Treatment/Interventions: Self-care/ADL training;DME and/or AE instruction;Therapeutic activities;Patient/family education;Balance training;Therapeutic exercise    OT Goals(Current goals can be found in the care plan section) Acute Rehab OT Goals Patient Stated Goal: home. less pain. OT Goal Formulation: With patient Time For Goal Achievement: 06/05/19 Potential to Achieve Goals: Good ADL Goals Pt Will Transfer to Toilet: with min assist;bedside commode;stand pivot transfer Additional ADL Goal #2: pt will go from sit to stand with min A and maintain with min guard for adls Additional ADL Goal #3: pt will perform bed mobility at min A level in preparation for adls  OT Frequency: Min 2X/week   Barriers to D/C:            Co-evaluation              AM-PAC OT "6 Clicks" Daily Activity     Outcome Measure Help from another person eating meals?: None Help from another person taking care of personal grooming?: A Little Help from another person toileting, which includes using toliet, bedpan, or urinal?: A Lot Help from another person bathing (including washing, rinsing, drying)?: A Lot Help from another person to put on and taking off regular upper body clothing?: A Little Help from another person to put on and taking off regular lower body  clothing?: Total 6 Click Score: 15   End of Session    Activity Tolerance: Patient tolerated treatment well Patient left: in bed;with call bell/phone within reach;with bed alarm set;with nursing/sitter in room  OT Visit Diagnosis: Unsteadiness on feet (R26.81);History of falling (Z91.81);Muscle weakness (generalized) (M62.81)                Time: 2671-2458 OT Time Calculation (min): 20 min Charges:  OT General Charges $OT Visit: 1 Visit OT Evaluation $OT Eval Moderate Complexity: 1 Mod  Wade Asebedo S, OTR/L Acute Rehabilitation Services 05/22/2019  Gleen Ripberger 05/22/2019, 2:44 PM

## 2019-05-22 NOTE — Progress Notes (Signed)
PROGRESS NOTE    [name] [name]  FBP:[id number] DOB: 15-Aug-1940 DOA: 05/21/2019 PCP: Nolene Ebbs, MD    Brief Narrative:  HPI per Dr.Adhikari [name] [name] is a 79 y.o. female with medical history significant of Covid pneumonia on November 8242, diastolic CHF, COPD on 2 L of oxygen at home, CKD stage IIIb, SLE, diabetes type 2, hypertension, hyperlipidemia, history of PE who presented from home with complaints of shortness of breath, fever, fall.  Patient said she fell tripped over her oxygen canister and fell to the ground.  Denies any dizziness or presyncopal symptoms.  Patient also complained of some shortness of breath since yesterday and was also wheezing.  She has been using her home nebulizer machine without minimal relief.  Also had subjective fever at home but afebrile on presentation.  Patient is a poor historian.  She follows with pulmonology as an outpatient for management of COPD.  She also follows with cardiology. Patient seen and examined at the bedside in the emergency department.  She was  hypertensive.  She was not in any acute respiratory distress.  She denied any chest pain, abdomen pain, nausea, vomiting or diarrhea.  ED Course: Required 3 L of oxygen per minute in the emergency department.  Chest x-ray showed interstitial scarring and central vascular congestion but not   pneumonia.  Admitted for the management of COPD exacerbation.  Started on bronchodilators and steroids.  Assessment & Plan:   Principal Problem:   Acute on chronic respiratory failure with hypoxia (HCC) Active Problems:   Chronic diastolic CHF (congestive heart failure) (HCC)   COPD (chronic obstructive pulmonary disease) (HCC)   Diabetes mellitus ([place])   Peripheral neuropathy   Fall at home, initial encounter   COPD exacerbation ([place])   Essential hypertension   Hyperlipidemia   Chronic kidney disease, stage 3b  1 acute on chronic respiratory failure with hypoxia secondary to acute COPD  exacerbation Patient improving slowly and clinically during the hospitalization.  Wheezing is improved.  Currently on 3 L nasal cannula O2.  Continue IV steroid taper, Mucinex, and, scheduled duo nebs,.  Dulera.  Add Flonase, Claritin.  Supportive care.  Follow.  2.  Chronic kidney disease stage IIIb Stable.  Monitor renal function.  3.  Chronic diastolic heart failure Compensated.  Currently euvolemic.  Continue home regimen diuretics of Lasix, Pravachol,imdur,lisinopril, HCTZ, hydralazine, Cardizem, aspirin.  4.  Well-controlled diabetes mellitus type 2 Hemoglobin A1c was 8.9 on 02/15/2019.  Hemoglobin A1c 5.2 on 05/21/2019.  CBG of 157 this morning.  Dose Lantus.  Decrease meal coverage NovoLog to 5 units 3 times daily with meals.  Continue sliding scale insulin.  5.  Hyperlipidemia Continue statin.  6.  Hypertension Continue home regimen of HCTZ, lisinopril, hydralazine, Imdur, Lasix.  7.  SLE No active flare.  Noted to have been on hydroxychloroquine in the past.  Outpatient follow-up.  8.  Recent Covid pneumonia Patient noted to have been admitted to [facility] in November 2020 and received treatment with remdesivir and Decadron.  Covid 19 PCR done on admission negative.  9.  Fall Patient noted to have a mechanical fall at home stated she tripped over her oxygen canister.  Plain films of the pelvis and knee and hip were done which were negative for any fracture or dislocation.  Patient being followed by PT OT.  Patient knows to use a walker/cane/wheelchair at home.  Will likely need home health therapies on discharge.  10.  History of gout Stable.  Continue allopurinol.  11.  Normocytic anemia H&H stable.  Follow.   DVT prophylaxis: Heparin Code Status: DNR Family Communication: Updated patient.  No family at bedside. Disposition Plan:  . Patient came from: Home            . Anticipated d/c place: Home with home health. . Barriers to d/c OR conditions which need  to be met to effect a safe d/c: Likely home when clinically improved and on oral steroid taper hopefully the next 24 to 48 hours.   Consultants:   None  Procedures:   Chest x-ray 05/21/2019  Plain films of the left hip and pelvis 05/21/2019  Plain films of the left knee 05/21/2019  Antimicrobials:   Oral azithromycin 05/21/2019     Subjective: Patient sitting up in chair.  Patient states she is feeling better.  Denies any chest pain.  On 3 L nasal cannula.  Objective: Vitals:   05/22/19 1013 05/22/19 1500 05/22/19 1835 05/22/19 2012  BP: (!) 141/70 120/62 128/70   Pulse: 84 71 71   Resp:  (!) 22    Temp:  98.8 F (37.1 C)    TempSrc:  Oral    SpO2:  98%  99%  Weight:      Height:        Intake/Output Summary (Last 24 hours) at 05/22/2019 2037 Last data filed at 05/22/2019 1500 Gross per 24 hour  Intake 440 ml  Output 1300 ml  Net -860 ml   Filed Weights   05/21/19 1041  Weight: 99.8 kg    Examination:  General exam: Appears calm and comfortable  Respiratory system: Poor to fair air movement.  Minimal expiratory wheezing.  Decreased breath sounds in the bases.  No rhonchi.  Speaking in full sentences.  Cardiovascular system: S1 & S2 heard, RRR. No JVD, murmurs, rubs, gallops or clicks. No pedal edema. Gastrointestinal system: Abdomen is nondistended, soft and nontender. No organomegaly or masses felt. Normal bowel sounds heard. Central nervous system: Alert and oriented. No focal neurological deficits. Extremities: Symmetric 5 x 5 power. Skin: No rashes, lesions or ulcers Psychiatry: Judgement and insight appear normal. Mood & affect appropriate.     Data Reviewed: I have personally reviewed following labs and imaging studies  CBC: Recent Labs  Lab 05/21/19 1147 05/22/19 0407  WBC 11.3* 6.3  NEUTROABS 9.2*  --   HGB 10.4* 9.2*  HCT 34.7* 30.5*  MCV 93.8 92.7  PLT 342 509   Basic Metabolic Panel: Recent Labs  Lab 05/21/19 1147 05/22/19 0407  NA 141  139  K 4.5 5.1  CL 103 103  CO2 27 28  GLUCOSE 80 176*  BUN 38* 44*  CREATININE 1.39* 1.38*  CALCIUM 10.1 9.7   GFR: Estimated Creatinine Clearance: 39.2 mL/min (A) (by C-G formula based on SCr of 1.38 mg/dL (H)). Liver Function Tests: Recent Labs  Lab 05/21/19 1147  AST 19  ALT 20  ALKPHOS 81  BILITOT 0.6  PROT 8.0  ALBUMIN 3.7   No results for input(s): LIPASE, AMYLASE in the last 168 hours. No results for input(s): AMMONIA in the last 168 hours. Coagulation Profile: No results for input(s): INR, PROTIME in the last 168 hours. Cardiac Enzymes: No results for input(s): CKTOTAL, CKMB, CKMBINDEX, TROPONINI in the last 168 hours. BNP (last 3 results) Recent Labs    10/31/18 1528  PROBNP 478   HbA1C: Recent Labs    05/21/19 1147  HGBA1C 5.2   CBG: Recent Labs  Lab 05/21/19  1611 05/21/19 2127 05/22/19 0747 05/22/19 1137 05/22/19 1709  GLUCAP 71 159* 157* 280* 113*   Lipid Profile: No results for input(s): CHOL, HDL, LDLCALC, TRIG, CHOLHDL, LDLDIRECT in the last 72 hours. Thyroid Function Tests: No results for input(s): TSH, T4TOTAL, FREET4, T3FREE, THYROIDAB in the last 72 hours. Anemia Panel: Recent Labs    05/22/19 0921  VITAMINB12 930*  FOLATE 10.0  FERRITIN 280  TIBC 245*  IRON 47   Sepsis Labs: No results for input(s): PROCALCITON, LATICACIDVEN in the last 168 hours.  Recent Results (from the past 240 hour(s))  Respiratory Panel by RT PCR (Flu A&B, Covid) - Urine, Clean Catch     Status: None   Collection Time: 05/21/19 11:47 AM   Specimen: Urine, Clean Catch  Result Value Ref Range Status   SARS Coronavirus 2 by RT PCR NEGATIVE NEGATIVE Final    Comment: (NOTE) SARS-CoV-2 target nucleic acids are NOT DETECTED. The SARS-CoV-2 RNA is generally detectable in upper respiratoy specimens during the acute phase of infection. The lowest concentration of SARS-CoV-2 viral copies this assay can detect is 131 copies/mL. A negative result does not  preclude SARS-Cov-2 infection and should not be used as the sole basis for treatment or other patient management decisions. A negative result may occur with  improper specimen collection/handling, submission of specimen other than nasopharyngeal swab, presence of viral mutation(s) within the areas targeted by this assay, and inadequate number of viral copies (<131 copies/mL). A negative result must be combined with clinical observations, patient history, and epidemiological information. The expected result is Negative. Fact Sheet for Patients:  PinkCheek.be Fact Sheet for Healthcare Providers:  GravelBags.it This test is not yet ap proved or cleared by the [place] FDA and  has been authorized for detection and/or diagnosis of SARS-CoV-2 by FDA under an Emergency Use Authorization (EUA). This EUA will remain  in effect (meaning this test can be used) for the duration of the COVID-19 declaration under Section 564(b)(1) of the Act, 21 U.S.C. section 360bbb-3(b)(1), unless the authorization is terminated or revoked sooner.    Influenza A by PCR NEGATIVE NEGATIVE Final   Influenza B by PCR NEGATIVE NEGATIVE Final    Comment: (NOTE) The Xpert Xpress SARS-CoV-2/FLU/RSV assay is intended as an aid in  the diagnosis of influenza from Nasopharyngeal swab specimens and  should not be used as a sole basis for treatment. Nasal washings and  aspirates are unacceptable for Xpert Xpress SARS-CoV-2/FLU/RSV  testing. Fact Sheet for Patients: PinkCheek.be Fact Sheet for Healthcare Providers: GravelBags.it This test is not yet approved or cleared by the [place] FDA and  has been authorized for detection and/or diagnosis of SARS-CoV-2 by  FDA under an Emergency Use Authorization (EUA). This EUA will remain  in effect (meaning this test can be used) for the duration of the    Covid-19 declaration under Section 564(b)(1) of the Act, 21  U.S.C. section 360bbb-3(b)(1), unless the authorization is  terminated or revoked. Performed at [facility], [place] [address] [place], [place] [address]          Radiology Studies: DG Chest Portable 1 View  Result Date: 05/21/2019 CLINICAL DATA:  [place] yesterday, COPD, CHF, short of breath EXAM: PORTABLE CHEST 1 VIEW COMPARISON:  04/19/2019 FINDINGS: Single frontal view of the chest demonstrates a stable cardiac silhouette. There is stable diffuse interstitial prominence throughout the lungs. Chronic central vascular congestion without focal airspace disease. No large effusion or pneumothorax. IMPRESSION: 1. Continued interstitial scarring and  central vascular congestion. No acute airspace disease. Electronically Signed   By: Randa Ngo M.D.   On: 05/21/2019 11:53   DG Knee Complete 4 Views Left  Result Date: 05/21/2019 CLINICAL DATA:  [place] yesterday, severe left hip and knee pain EXAM: LEFT KNEE - COMPLETE 4+ VIEW COMPARISON:  None FINDINGS: Frontal, oblique, lateral views of the left knee demonstrate no fracture, subluxation, or dislocation. Three compartmental osteoarthritis greatest in the medial compartment. No joint effusion. IMPRESSION: 1. Osteoarthritis, no acute fracture. Electronically Signed   By: Randa Ngo M.D.   On: 05/21/2019 11:51   DG Hip Unilat W or Wo Pelvis 2-3 Views Left  Result Date: 05/21/2019 CLINICAL DATA:  [place] yesterday, severe left hip and knee pain EXAM: DG HIP (WITH OR WITHOUT PELVIS) 2-3V LEFT COMPARISON:  07/20/2012 FINDINGS: Frontal views of the pelvis as well as frontal and frogleg lateral views of the left hip are obtained. Evaluation is slightly limited due to technique and patient body habitus. Continued right greater than left hip osteoarthritis, with progression of the axial joint space narrowing within the right hip seen previously. No acute displaced fracture.  Alignment is anatomic. Soft tissues are unremarkable. IMPRESSION: 1. No acute displaced hip fracture. 2. Right greater than left hip osteoarthritis. Electronically Signed   By: Randa Ngo M.D.   On: 05/21/2019 11:49        Scheduled Meds: . allopurinol  100 mg Oral Daily  . aspirin EC  81 mg Oral Daily  . azithromycin  500 mg Oral Daily  . citalopram  10 mg Oral Daily  . diltiazem  300 mg Oral Daily  . fluticasone  2 spray Each Nare Daily  . furosemide  40 mg Oral BID  . gabapentin  300 mg Oral BID  . guaiFENesin  1,200 mg Oral BID  . heparin  5,000 Units Subcutaneous Q8H  . hydrALAZINE  50 mg Oral TID  . lisinopril  20 mg Oral Daily   And  . hydrochlorothiazide  12.5 mg Oral Daily  . insulin aspart  0-20 Units Subcutaneous TID WC  . insulin aspart  0-5 Units Subcutaneous QHS  . [START ON 05/23/2019] insulin aspart  5 Units Subcutaneous TID AC  . insulin glargine  40 Units Subcutaneous Daily  . [START ON 05/23/2019] ipratropium-albuterol  3 mL Nebulization TID  . isosorbide mononitrate  30 mg Oral Daily  . loratadine  10 mg Oral Daily  . methylPREDNISolone (SOLU-MEDROL) injection  40 mg Intravenous Q12H  . metoprolol tartrate  50 mg Oral BID  . mometasone-formoterol  2 puff Inhalation BID  . pantoprazole  40 mg Oral Daily  . pravastatin  40 mg Oral QHS  . vitamin B-12  1,000 mcg Oral Daily   Continuous Infusions:   LOS: 0 days    Time spent: 35 minutes    Irine Seal, MD [facility]   To contact the attending provider between 7A-7P or the covering provider during after hours 7P-7A, please log into the web site [email or web address] and access using universal [place] password for that web site. If you do not have the password, please call the hospital operator.  05/22/2019, 8:37 PM

## 2019-05-22 NOTE — Evaluation (Addendum)
Physical Therapy Evaluation Patient Details Name: [name] [name] MRN: [id number] DOB: May 18, 1940 Today's Date: 05/22/2019   History of Present Illness  79 yo female admitted with acute on chronic respiratory failure, fall at home. Hx of COPD-O2 dep, CKD, breast ca, mod-severe OA, lupus, obesity, asthma, carpal tunnel syndrome, COVID 19 02/2019  Clinical Impression  On eval, pt required Mod assist for mobility. She was able to perform a stand pivot, bed to recliner, but not without great difficulty due to pain and fatigue. O2 80% on 3L [place] with activity. Pt rated pain 9/10 in L LE/hip with Wbing. Discussed d/c plan-pt is adamant about returning home. She stated she has in home assistance from a HHA and HHPT/HHOT. Will continue to follow and progress activity as tolerated. May need to consider SNF if aide cannot provide current level of assistance.     Follow Up Recommendations Home health PT;Supervision/Assistance - 24 hour(pt is adamant about returning home and resuming HH services)    Equipment Recommendations  None recommended by PT    Recommendations for Other Services       Precautions / Restrictions Precautions Precautions: Fall Precaution Comments: O2 dep-monitor O2 Restrictions Weight Bearing Restrictions: No      Mobility  Bed Mobility Overal bed mobility: Needs Assistance Bed Mobility: Supine to Sit     Supine to sit: Mod assist;HOB elevated     General bed mobility comments: Assist for trunk and to scoot to EOB. Increased time. Utilized bedpad to aid with scooting, positioning. Rest break required after bed mobility.  Transfers Overall transfer level: Needs assistance Equipment used: Rolling walker (2 wheeled) Transfers: Sit to/from Stand Sit to Stand: Mod assist;From elevated surface         General transfer comment: Assist to rise, stabilize, control descent. Increased time. Pt reported 9/10 pain after performing stand pivot, bed to recliner, using RW. Antalgic  gait pattern.  Ambulation/Gait             General Gait Details: NT-pt unable to tolerate this session. O2 80% on 3L [place] and pain 9/10 after stand pivot  Stairs            Wheelchair Mobility    Modified Rankin (Stroke Patients Only)       Balance Overall balance assessment: Needs assistance;History of Falls         Standing balance support: Bilateral upper extremity supported Standing balance-Leahy Scale: Poor                               Pertinent Vitals/Pain Pain Assessment: 0-10 Pain Score: 9  Faces Pain Scale: Hurts even more Pain Location: bil LEs, L wrist Pain Descriptors / Indicators: Discomfort;Sore;Aching Pain Intervention(s): Limited activity within patient's tolerance;Monitored during session;Repositioned;Patient requesting pain meds-RN notified    Home Living Family/patient expects to be discharged to:: Private residence Living Arrangements: Alone Available Help at Discharge: Personal care attendant(7 days a week; 8-5) Type of Home: Apartment Home Access: Level entry     Home Layout: One level Home Equipment: Walker - 4 wheels;Walker - 2 wheels;Shower seat;Wheelchair - manual;Cane - single point      Prior Function Level of Independence: Needs assistance   Gait / Transfers Assistance Needed: used RW vs WC  ADL's / Homemaking Assistance Needed: pt reports aid helps with cleaning, cooking, helps with bathing and intermittently with dressing        Hand Dominance  Extremity/Trunk Assessment   Upper Extremity Assessment Upper Extremity Assessment: Overall WFL for tasks assessed    Lower Extremity Assessment Lower Extremity Assessment: Generalized weakness    Cervical / Trunk Assessment Cervical / Trunk Assessment: Kyphotic  Communication   Communication: No difficulties  Cognition Arousal/Alertness: Awake/alert Behavior During Therapy: WFL for tasks assessed/performed Overall Cognitive Status: Within  Functional Limits for tasks assessed                                        General Comments      Exercises     Assessment/Plan    PT Assessment Patient needs continued PT services  PT Problem List Decreased strength;Decreased mobility;Decreased activity tolerance;Decreased balance;Decreased knowledge of use of DME;Pain;Obesity       PT Treatment Interventions DME instruction;Gait training;Therapeutic activities;Therapeutic exercise;Balance training;Functional mobility training;Patient/family education    PT Goals (Current goals can be found in the Care Plan section)  Acute Rehab PT Goals Patient Stated Goal: home. less pain. PT Goal Formulation: With patient Time For Goal Achievement: 06/05/19 Potential to Achieve Goals: Fair    Frequency Min 3X/week   Barriers to discharge        Co-evaluation               AM-PAC PT "6 Clicks" Mobility  Outcome Measure Help needed turning from your back to your side while in a flat bed without using bedrails?: A Lot Help needed moving from lying on your back to sitting on the side of a flat bed without using bedrails?: A Lot Help needed moving to and from a bed to a chair (including a wheelchair)?: A Lot Help needed standing up from a chair using your arms (e.g., wheelchair or bedside chair)?: A Lot Help needed to walk in hospital room?: A Lot Help needed climbing 3-5 steps with a railing? : Total 6 Click Score: 11    End of Session Equipment Utilized During Treatment: Gait belt;Oxygen Activity Tolerance: Patient limited by fatigue;Patient limited by pain Patient left: in chair;with call bell/phone within reach;with chair alarm set   PT Visit Diagnosis: Muscle weakness (generalized) (M62.81);Pain;Difficulty in walking, not elsewhere classified (R26.2);History of falling (Z91.81) Pain - Right/Left: Left Pain - part of body: Hip    Time: 0211-1735 PT Time Calculation (min) (ACUTE ONLY): 29  min   Charges:   PT Evaluation $PT Eval Moderate Complexity: 1 Mod PT Treatments $Therapeutic Activity: 8-22 mins           Doreatha Massed, PT Acute Rehabilitation

## 2019-05-23 DIAGNOSIS — E785 Hyperlipidemia, unspecified: Secondary | ICD-10-CM | POA: Diagnosis present

## 2019-05-23 DIAGNOSIS — I130 Hypertensive heart and chronic kidney disease with heart failure and stage 1 through stage 4 chronic kidney disease, or unspecified chronic kidney disease: Secondary | ICD-10-CM | POA: Diagnosis present

## 2019-05-23 DIAGNOSIS — N1832 Chronic kidney disease, stage 3b: Secondary | ICD-10-CM | POA: Diagnosis present

## 2019-05-23 DIAGNOSIS — F419 Anxiety disorder, unspecified: Secondary | ICD-10-CM | POA: Diagnosis present

## 2019-05-23 DIAGNOSIS — E1169 Type 2 diabetes mellitus with other specified complication: Secondary | ICD-10-CM | POA: Diagnosis present

## 2019-05-23 DIAGNOSIS — Z9981 Dependence on supplemental oxygen: Secondary | ICD-10-CM | POA: Diagnosis not present

## 2019-05-23 DIAGNOSIS — Z20822 Contact with and (suspected) exposure to covid-19: Secondary | ICD-10-CM | POA: Diagnosis present

## 2019-05-23 DIAGNOSIS — D649 Anemia, unspecified: Secondary | ICD-10-CM | POA: Diagnosis present

## 2019-05-23 DIAGNOSIS — M5136 Other intervertebral disc degeneration, lumbar region: Secondary | ICD-10-CM | POA: Diagnosis present

## 2019-05-23 DIAGNOSIS — J441 Chronic obstructive pulmonary disease with (acute) exacerbation: Secondary | ICD-10-CM | POA: Diagnosis present

## 2019-05-23 DIAGNOSIS — M8580 Other specified disorders of bone density and structure, unspecified site: Secondary | ICD-10-CM | POA: Diagnosis present

## 2019-05-23 DIAGNOSIS — E1142 Type 2 diabetes mellitus with diabetic polyneuropathy: Secondary | ICD-10-CM | POA: Diagnosis present

## 2019-05-23 DIAGNOSIS — J9621 Acute and chronic respiratory failure with hypoxia: Secondary | ICD-10-CM | POA: Diagnosis present

## 2019-05-23 DIAGNOSIS — H409 Unspecified glaucoma: Secondary | ICD-10-CM | POA: Diagnosis present

## 2019-05-23 DIAGNOSIS — E1122 Type 2 diabetes mellitus with diabetic chronic kidney disease: Secondary | ICD-10-CM | POA: Diagnosis present

## 2019-05-23 DIAGNOSIS — M109 Gout, unspecified: Secondary | ICD-10-CM | POA: Diagnosis present

## 2019-05-23 DIAGNOSIS — Z9181 History of falling: Secondary | ICD-10-CM | POA: Diagnosis not present

## 2019-05-23 DIAGNOSIS — M79605 Pain in left leg: Secondary | ICD-10-CM | POA: Diagnosis present

## 2019-05-23 DIAGNOSIS — E669 Obesity, unspecified: Secondary | ICD-10-CM | POA: Diagnosis present

## 2019-05-23 DIAGNOSIS — I5032 Chronic diastolic (congestive) heart failure: Secondary | ICD-10-CM | POA: Diagnosis present

## 2019-05-23 DIAGNOSIS — Z8616 Personal history of COVID-19: Secondary | ICD-10-CM | POA: Diagnosis not present

## 2019-05-23 DIAGNOSIS — M329 Systemic lupus erythematosus, unspecified: Secondary | ICD-10-CM | POA: Diagnosis present

## 2019-05-23 DIAGNOSIS — Z66 Do not resuscitate: Secondary | ICD-10-CM | POA: Diagnosis present

## 2019-05-23 DIAGNOSIS — K219 Gastro-esophageal reflux disease without esophagitis: Secondary | ICD-10-CM | POA: Diagnosis present

## 2019-05-23 DIAGNOSIS — G4730 Sleep apnea, unspecified: Secondary | ICD-10-CM | POA: Diagnosis present

## 2019-05-23 LAB — CBC WITH DIFFERENTIAL/PLATELET
Abs Immature Granulocytes: 0.05 10*3/uL (ref 0.00–0.07)
Basophils Absolute: 0 10*3/uL (ref 0.0–0.1)
Basophils Relative: 0 %
Eosinophils Absolute: 0 10*3/uL (ref 0.0–0.5)
Eosinophils Relative: 0 %
HCT: 28.9 % — ABNORMAL LOW (ref 36.0–46.0)
Hemoglobin: 8.7 g/dL — ABNORMAL LOW (ref 12.0–15.0)
Immature Granulocytes: 1 %
Lymphocytes Relative: 7 %
Lymphs Abs: 0.5 10*3/uL — ABNORMAL LOW (ref 0.7–4.0)
MCH: 27.8 pg (ref 26.0–34.0)
MCHC: 30.1 g/dL (ref 30.0–36.0)
MCV: 92.3 fL (ref 80.0–100.0)
Monocytes Absolute: 0.1 10*3/uL (ref 0.1–1.0)
Monocytes Relative: 1 %
Neutro Abs: 7.6 10*3/uL (ref 1.7–7.7)
Neutrophils Relative %: 91 %
Platelets: 312 10*3/uL (ref 150–400)
RBC: 3.13 MIL/uL — ABNORMAL LOW (ref 3.87–5.11)
RDW: 16.6 % — ABNORMAL HIGH (ref 11.5–15.5)
WBC: 8.3 10*3/uL (ref 4.0–10.5)
nRBC: 0 % (ref 0.0–0.2)

## 2019-05-23 LAB — GLUCOSE, CAPILLARY
Glucose-Capillary: 166 mg/dL — ABNORMAL HIGH (ref 70–99)
Glucose-Capillary: 230 mg/dL — ABNORMAL HIGH (ref 70–99)
Glucose-Capillary: 277 mg/dL — ABNORMAL HIGH (ref 70–99)
Glucose-Capillary: 141 mg/dL — ABNORMAL HIGH (ref 70–99)

## 2019-05-23 LAB — BASIC METABOLIC PANEL
Anion gap: 11 (ref 5–15)
BUN: 65 mg/dL — ABNORMAL HIGH (ref 8–23)
CO2: 24 mmol/L (ref 22–32)
Calcium: 9.2 mg/dL (ref 8.9–10.3)
Chloride: 101 mmol/L (ref 98–111)
Creatinine, Ser: 1.93 mg/dL — ABNORMAL HIGH (ref 0.44–1.00)
GFR calc Af Amer: 28 mL/min — ABNORMAL LOW (ref >60)
GFR calc non Af Amer: 25 mL/min — ABNORMAL LOW (ref >60)
Glucose, Bld: 232 mg/dL — ABNORMAL HIGH (ref 70–99)
Potassium: 4.6 mmol/L (ref 3.5–5.1)
Sodium: 136 mmol/L (ref 135–145)

## 2019-05-23 MED ORDER — IPRATROPIUM-ALBUTEROL 0.5-2.5 (3) MG/3ML IN SOLN
3.0000 mL | Freq: Two times a day (BID) | RESPIRATORY_TRACT | Status: DC
  Administered 2019-05-23 – 2019-05-24 (×2): 3 mL via RESPIRATORY_TRACT
  Filled 2019-05-23 (×2): qty 3

## 2019-05-23 MED ORDER — PREDNISONE 50 MG PO TABS
60.0000 mg | ORAL_TABLET | Freq: Every day | ORAL | Status: DC
  Administered 2019-05-23 – 2019-05-24 (×2): 60 mg via ORAL
  Filled 2019-05-23 (×2): qty 1

## 2019-05-23 MED ORDER — LORATADINE 10 MG PO TABS: 10.0000 mg | ORAL_TABLET | Freq: Every day | ORAL | 0 refills | Status: DC

## 2019-05-23 MED ORDER — GUAIFENESIN ER 600 MG PO TB12: 1200.0000 mg | ORAL_TABLET | Freq: Two times a day (BID) | ORAL | 0 refills | Status: AC

## 2019-05-23 MED ORDER — INSULIN GLARGINE 100 UNIT/ML ~~LOC~~ SOLN: 40.0000 [IU] | Freq: Every day | SUBCUTANEOUS | 0 refills | Status: DC

## 2019-05-23 MED ORDER — IPRATROPIUM-ALBUTEROL 0.5-2.5 (3) MG/3ML IN SOLN: 3.0000 mL | Freq: Three times a day (TID) | RESPIRATORY_TRACT | 2 refills | Status: AC

## 2019-05-23 MED ORDER — PREDNISONE 20 MG PO TABS: 20.0000 mg | ORAL_TABLET | Freq: Every day | ORAL | Status: DC

## 2019-05-23 MED ORDER — LISINOPRIL-HYDROCHLOROTHIAZIDE 20-12.5 MG PO TABS: 1.0000 | ORAL_TABLET | Freq: Every day | ORAL | Status: DC

## 2019-05-23 MED ORDER — FLUTICASONE PROPIONATE 50 MCG/ACT NA SUSP: 2.0000 | Freq: Every day | NASAL | 0 refills | Status: AC

## 2019-05-23 NOTE — TOC Progression Note (Addendum)
Transition of Care [facility]) - Progression Note    Patient Details  Name: [name] [name] MRN: [id number] Date of Birth: [birth year]  Transition of [facility]) CM/SW Contact  Ross Ludwig, [place] Phone Number: 05/23/2019, 4:05 PM  Clinical Narrative:     Patient not medically ready for discharge today per physician.  Patient is active with [facility], CSW updated [facility] that patient is not ready for discharge yet.  Patient does have oxygen at home, and does not feel like she needs to add anything else.  CSW to continue to follow patient's progress throughout discharge planning.   Expected Discharge Plan: [place] Barriers to Discharge: Continued Medical Work up  Expected Discharge Plan and Services Expected Discharge Plan: [place] In-house Referral: Clinical Social Work Discharge Planning Services: CM Consult Post Acute Care Choice: [place] arrangements for the past 2 months: Single Family Home Expected Discharge Date: 05/23/19                         HH Arranged: RN, PT, OT HH Agency: [facility] Date [facility] Agency Contacted: 05/21/19 Time [address]: 1807 Representative spoke with at [place]: [place] ([place]) Interventions    Readmission Risk Interventions No flowsheet data found.

## 2019-05-23 NOTE — Progress Notes (Addendum)
PROGRESS NOTE    [name] [name]  KKX:[id number] DOB: 1941/02/04 DOA: 05/21/2019 PCP: Nolene Ebbs, MD    Brief Narrative:  HPI per Dr.Adhikari JELICIA NANTZ is a 79 y.o. female with medical history significant of Covid pneumonia on November 1696, diastolic CHF, COPD on 2 L of oxygen at home, CKD stage IIIb, SLE, diabetes type 2, hypertension, hyperlipidemia, history of PE who presented from home with complaints of shortness of breath, fever, fall.  Patient said she fell tripped over her oxygen canister and fell to the ground.  Denies any dizziness or presyncopal symptoms.  Patient also complained of some shortness of breath since yesterday and was also wheezing.  She has been using her home nebulizer machine without minimal relief.  Also had subjective fever at home but afebrile on presentation.  Patient is a poor historian.  She follows with pulmonology as an outpatient for management of COPD.  She also follows with cardiology. Patient seen and examined at the bedside in the emergency department.  She was  hypertensive.  She was not in any acute respiratory distress.  She denied any chest pain, abdomen pain, nausea, vomiting or diarrhea.  ED Course: Required 3 L of oxygen per minute in the emergency department.  Chest x-ray showed interstitial scarring and central vascular congestion but not   pneumonia.  Admitted for the management of COPD exacerbation.  Started on bronchodilators and steroids.  Assessment & Plan:   Principal Problem:   Acute on chronic respiratory failure with hypoxia (HCC) Active Problems:   Chronic diastolic CHF (congestive heart failure) (HCC)   COPD (chronic obstructive pulmonary disease) (HCC)   Diabetes mellitus ([place])   Peripheral neuropathy   Fall at home, initial encounter   COPD exacerbation ([place])   Essential hypertension   Hyperlipidemia   Chronic kidney disease, stage 3b  1 acute on chronic respiratory failure with hypoxia secondary to acute COPD  exacerbation Patient improving clinically and slowly.  Wheezing improving daily.  O2 requirements improved currently on 2 L nasal cannula.  Discontinue IV steroids and placed on oral prednisone taper.  Continue Mucinex, scheduled duo nebs, Dulera, Flonase, Claritin, PPI.  Supportive care.  2.  Chronic kidney disease stage IIIb Patient with a bump in creatinine this morning.  Will hold HCTZ and lisinopril.  Follow renal function.   3.  Chronic diastolic heart failure Compensated.  Currently euvolemic.  Continue home regimen diuretics of Lasix.  Continue Pravachol,imdur,lhydralazine, Cardizem, aspirin.  Due to bump in creatinine we will hold lisinopril and HCTZ.  4.  Well-controlled diabetes mellitus type 2 Hemoglobin A1c was 8.9 on 02/15/2019.  Hemoglobin A1c 5.2 on 05/21/2019.  CBG of 166 this morning.  Continue current dose of Lantus.  Continue current meal coverage NovoLog 5 units 3 times daily.  Sliding scale insulin.    5.  Hyperlipidemia Continue statin.  6.  Hypertension We will hold HCTZ and lisinopril today due to increasing creatinine however renal function close to baseline.  Continue hydralazine, Imdur, Lasix.  Outpatient follow-up.    7.  SLE No active flare.  Noted to have been on hydroxychloroquine in the past.  Outpatient follow-up.  8.  Recent Covid pneumonia Patient noted to have been admitted to [facility] in November 2020 and received treatment with remdesivir and Decadron.  Covid 19 PCR done on admission negative.  Supportive care.  9.  Fall Patient noted to have a mechanical fall at home stated she tripped over her oxygen canister.  Plain films  of the pelvis and knee and hip were done which were negative for any fracture or dislocation.  Patient being followed by PT OT.  Patient noted to use a walker/cane/wheelchair at home.  Will likely need home health therapies on discharge.  10.  History of gout Continue allopurinol.   11.  Normocytic anemia H&H  stable.  Follow.   DVT prophylaxis: Heparin Code Status: DNR Family Communication: Updated patient.  No family at bedside. Disposition Plan:  . Patient came from: Home            . Anticipated d/c place: Home with home health. . Barriers to d/c OR conditions which need to be met to effect a safe d/c: Likely home when clinically improved with continued improvement with shortness of breath, and on oral steroid taper hopefully tomorrow.    Consultants:   None  Procedures:   Chest x-ray 05/21/2019  Plain films of the left hip and pelvis 05/21/2019  Plain films of the left knee 05/21/2019  Antimicrobials:   Oral azithromycin 05/21/2019>>>>> 05/23/2019     Subjective: Patient sitting up eating lunch.  Patient stating you are late and ready to go home.  Patient states breathing has improved.  Patient back on home regimen of 2 L nasal cannula.    Objective: Vitals:   05/22/19 2102 05/23/19 0457 05/23/19 0820 05/23/19 1249  BP: 130/65 138/68  (!) 149/71  Pulse: 76 72  84  Resp: '18 20  20  '$ Temp: 98.3 F (36.8 C) 98.7 F (37.1 C)  98.6 F (37 C)  TempSrc: Oral Oral  Oral  SpO2: 98% 99% 98% 94%  Weight:      Height:        Intake/Output Summary (Last 24 hours) at 05/23/2019 1314 Last data filed at 05/23/2019 0508 Gross per 24 hour  Intake 240 ml  Output 300 ml  Net -60 ml   Filed Weights   05/21/19 1041  Weight: 99.8 kg    Examination:  General exam: NAD Respiratory system: Minimal expiratory wheezing.  Poor to fair air movement. Decreased breath sounds in the bases.  No rhonchi.  Speaking in full sentences.  Cardiovascular system: Regular rate rhythm no murmurs rubs or gallops.  No JVD.  No lower extremity edema.  Gastrointestinal system: Abdomen is soft, nontender, nondistended, positive bowel sounds.  No rebound.  No guarding. Central nervous system: Alert and oriented. No focal neurological deficits. Extremities: Symmetric 5 x 5 power. Skin: No rashes, lesions or  ulcers Psychiatry: Judgement and insight appear normal. Mood & affect appropriate.     Data Reviewed: I have personally reviewed following labs and imaging studies  CBC: Recent Labs  Lab 05/21/19 1147 05/22/19 0407 05/23/19 0302  WBC 11.3* 6.3 8.3  NEUTROABS 9.2*  --  7.6  HGB 10.4* 9.2* 8.7*  HCT 34.7* 30.5* 28.9*  MCV 93.8 92.7 92.3  PLT 342 292 409   Basic Metabolic Panel: Recent Labs  Lab 05/21/19 1147 05/22/19 0407 05/23/19 0302  NA 141 139 136  K 4.5 5.1 4.6  CL 103 103 101  CO2 '27 28 24  '$ GLUCOSE 80 176* 232*  BUN 38* 44* 65*  CREATININE 1.39* 1.38* 1.93*  CALCIUM 10.1 9.7 9.2   GFR: Estimated Creatinine Clearance: 28 mL/min (A) (by C-G formula based on SCr of 1.93 mg/dL (H)). Liver Function Tests: Recent Labs  Lab 05/21/19 1147  AST 19  ALT 20  ALKPHOS 81  BILITOT 0.6  PROT 8.0  ALBUMIN 3.7  No results for input(s): LIPASE, AMYLASE in the last 168 hours. No results for input(s): AMMONIA in the last 168 hours. Coagulation Profile: No results for input(s): INR, PROTIME in the last 168 hours. Cardiac Enzymes: No results for input(s): CKTOTAL, CKMB, CKMBINDEX, TROPONINI in the last 168 hours. BNP (last 3 results) Recent Labs    10/31/18 1528  PROBNP 478   HbA1C: Recent Labs    05/21/19 1147  HGBA1C 5.2   CBG: Recent Labs  Lab 05/22/19 1137 05/22/19 1709 05/22/19 2057 05/23/19 0818 05/23/19 1121  GLUCAP 280* 113* 126* 166* 230*   Lipid Profile: No results for input(s): CHOL, HDL, LDLCALC, TRIG, CHOLHDL, LDLDIRECT in the last 72 hours. Thyroid Function Tests: No results for input(s): TSH, T4TOTAL, FREET4, T3FREE, THYROIDAB in the last 72 hours. Anemia Panel: Recent Labs    05/22/19 0921  VITAMINB12 930*  FOLATE 10.0  FERRITIN 280  TIBC 245*  IRON 47   Sepsis Labs: No results for input(s): PROCALCITON, LATICACIDVEN in the last 168 hours.  Recent Results (from the past 240 hour(s))  Respiratory Panel by RT PCR (Flu A&B,  Covid) - Urine, Clean Catch     Status: None   Collection Time: 05/21/19 11:47 AM   Specimen: Urine, Clean Catch  Result Value Ref Range Status   SARS Coronavirus 2 by RT PCR NEGATIVE NEGATIVE Final    Comment: (NOTE) SARS-CoV-2 target nucleic acids are NOT DETECTED. The SARS-CoV-2 RNA is generally detectable in upper respiratoy specimens during the acute phase of infection. The lowest concentration of SARS-CoV-2 viral copies this assay can detect is 131 copies/mL. A negative result does not preclude SARS-Cov-2 infection and should not be used as the sole basis for treatment or other patient management decisions. A negative result may occur with  improper specimen collection/handling, submission of specimen other than nasopharyngeal swab, presence of viral mutation(s) within the areas targeted by this assay, and inadequate number of viral copies (<131 copies/mL). A negative result must be combined with clinical observations, patient history, and epidemiological information. The expected result is Negative. Fact Sheet for Patients:  PinkCheek.be Fact Sheet for Healthcare Providers:  GravelBags.it This test is not yet ap proved or cleared by the [place] FDA and  has been authorized for detection and/or diagnosis of SARS-CoV-2 by FDA under an Emergency Use Authorization (EUA). This EUA will remain  in effect (meaning this test can be used) for the duration of the COVID-19 declaration under Section 564(b)(1) of the Act, 21 U.S.C. section 360bbb-3(b)(1), unless the authorization is terminated or revoked sooner.    Influenza A by PCR NEGATIVE NEGATIVE Final   Influenza B by PCR NEGATIVE NEGATIVE Final    Comment: (NOTE) The Xpert Xpress SARS-CoV-2/FLU/RSV assay is intended as an aid in  the diagnosis of influenza from Nasopharyngeal swab specimens and  should not be used as a sole basis for treatment. Nasal washings and    aspirates are unacceptable for Xpert Xpress SARS-CoV-2/FLU/RSV  testing. Fact Sheet for Patients: PinkCheek.be Fact Sheet for Healthcare Providers: GravelBags.it This test is not yet approved or cleared by the [place] FDA and  has been authorized for detection and/or diagnosis of SARS-CoV-2 by  FDA under an Emergency Use Authorization (EUA). This EUA will remain  in effect (meaning this test can be used) for the duration of the  Covid-19 declaration under Section 564(b)(1) of the Act, 21  U.S.C. section 360bbb-3(b)(1), unless the authorization is  terminated or revoked. Performed at [facility], [address] [place], [place] [address]          Radiology Studies: No results found.      Scheduled Meds: . allopurinol  100 mg Oral Daily  . aspirin EC  81 mg Oral Daily  . citalopram  10 mg Oral Daily  . diltiazem  300 mg Oral Daily  . fluticasone  2 spray Each Nare Daily  . furosemide  40 mg Oral BID  . gabapentin  300 mg Oral BID  . guaiFENesin  1,200 mg Oral BID  . heparin  5,000 Units Subcutaneous Q8H  . hydrALAZINE  50 mg Oral TID  . insulin aspart  0-20 Units Subcutaneous TID WC  . insulin aspart  0-5 Units Subcutaneous QHS  . insulin aspart  5 Units Subcutaneous TID AC  . insulin glargine  40 Units Subcutaneous Daily  . ipratropium-albuterol  3 mL Nebulization BID  . isosorbide mononitrate  30 mg Oral Daily  . loratadine  10 mg Oral Daily  . metoprolol tartrate  50 mg Oral BID  . mometasone-formoterol  2 puff Inhalation BID  . pantoprazole  40 mg Oral Daily  . pravastatin  40 mg Oral QHS  . predniSONE  60 mg Oral QAC breakfast  . vitamin B-12  1,000 mcg Oral Daily   Continuous Infusions:   LOS: 0 days    Time spent: 35 minutes    Irine Seal, MD [facility]   To contact the attending provider between 7A-7P or the covering provider during after hours  7P-7A, please log into the web site [email or web address] and access using universal [facility] password for that web site. If you do not have the password, please call the hospital operator.  05/23/2019, 1:14 PM

## 2019-05-23 NOTE — TOC Progression Note (Signed)
Transition of Care [facility]) - Progression Note    Patient Details  Name: [name] [name] MRN: [id number] Date of Birth: 11/06/1940  Transition of [facility]) CM/SW Contact  Purcell Mouton, RN Phone Number: 05/23/2019, 12:05 PM  Clinical Narrative:    Pt discharging home with [facility].    Expected Discharge Plan: [place] Barriers to Discharge: Continued Medical Work up  Expected Discharge Plan and Services Expected Discharge Plan: [place] In-house Referral: Clinical Social Work Discharge Planning Services: CM Consult Post Acute Care Choice: [place] arrangements for the past 2 months: Single Family Home                           HH Arranged: RN, PT, OT [facility] Agency: [facility] Date [place]: 05/21/19 Time [place]: 1807 Representative spoke with at [place]: [place] ([place]) Interventions    Readmission Risk Interventions No flowsheet data found.

## 2019-05-23 NOTE — Progress Notes (Signed)
Physical Therapy Treatment Patient Details Name: [name] [name] MRN: [id number] DOB: 04/14/1941 Today's Date: 05/23/2019    History of Present Illness 79 yo female admitted with acute on chronic respiratory failure, fall at home. Hx of COPD-O2 dep, CKD, breast ca, mod-severe OA, lupus, obesity, asthma, carpal tunnel syndrome, COVID 19 02/2019    PT Comments    Pt with decreased pain today and able to take a few steps.  Continues to need min-mod A for transfers and cues for safety and breathing technique.  On 2 LPM O2 sats dropped to 80% with activity but on 3 LPM sats maintained 90% with 4' of ambulation.      Follow Up Recommendations  Home health PT;Supervision/Assistance - 24 hour(Declined SNF reports has [facility])     Equipment Recommendations  None recommended by PT    Recommendations for Other Services       Precautions / Restrictions Precautions Precautions: Fall Precaution Comments: O2 dep-monitor O2    Mobility  Bed Mobility Overal bed mobility: Needs Assistance Bed Mobility: Supine to Sit     Supine to sit: Mod assist;HOB elevated     General bed mobility comments: Required min A for legs and mod A to elevate trunk and scoot to EOB.  Needed increased time and rest break at EOB.  Transfers Overall transfer level: Needs assistance Equipment used: Rolling walker (2 wheeled) Transfers: Sit to/from Stand Sit to Stand: Min assist;Mod assist         General transfer comment: min A from elevated EOB and mod A with cues for use of momentum from chair; cued for safe hand placment and required increased time  Ambulation/Gait Ambulation/Gait assistance: Min assist Gait Distance (Feet): 4 Feet(x2) Assistive device: Rolling walker (2 wheeled) Gait Pattern/deviations: Decreased stride length;Shuffle;Step-to pattern     General Gait Details: Step to L; increased time; 5 minute rest break between bouts of ambulation; cued for RW; On 2 LPM first bout with sats dropping to  80% and dyspnea 3/4.  Increased to 3 LPM for second bout and sats maintained 90% with 2/4 dyspnea.  Placed back on 2 LPM at rest with sats 92%   Stairs             Wheelchair Mobility    Modified Rankin (Stroke Patients Only)       Balance Overall balance assessment: Needs assistance;History of Falls Sitting-balance support: Bilateral upper extremity supported;Feet supported Sitting balance-Leahy Scale: Fair     Standing balance support: Bilateral upper extremity supported;During functional activity Standing balance-Leahy Scale: Poor                              Cognition Arousal/Alertness: Awake/alert Behavior During Therapy: WFL for tasks assessed/performed Overall Cognitive Status: Within Functional Limits for tasks assessed                                        Exercises      General Comments        Pertinent Vitals/Pain Pain Assessment: Faces Faces Pain Scale: Hurts a little bit Pain Location: L LE Pain Descriptors / Indicators: Discomfort;Sore;Aching Pain Intervention(s): Limited activity within patient's tolerance;Monitored during session;Repositioned    Home Living                      Prior Function  PT Goals (current goals can now be found in the care plan section) Progress towards PT goals: Progressing toward goals    Frequency    Min 3X/week      PT Plan Current plan remains appropriate    Co-evaluation              AM-PAC PT "6 Clicks" Mobility   Outcome Measure  Help needed turning from your back to your side while in a flat bed without using bedrails?: A Lot Help needed moving from lying on your back to sitting on the side of a flat bed without using bedrails?: A Lot Help needed moving to and from a bed to a chair (including a wheelchair)?: A Lot Help needed standing up from a chair using your arms (e.g., wheelchair or bedside chair)?: A Lot Help needed to walk in hospital  room?: A Lot Help needed climbing 3-5 steps with a railing? : Total 6 Click Score: 11    End of Session Equipment Utilized During Treatment: Gait belt;Oxygen Activity Tolerance: Patient limited by fatigue;Patient limited by pain Patient left: in chair;with call bell/phone within reach;with chair alarm set Nurse Communication: Mobility status PT Visit Diagnosis: Muscle weakness (generalized) (M62.81);Pain;Difficulty in walking, not elsewhere classified (R26.2);History of falling (Z91.81)     Time: 1478-2956 PT Time Calculation (min) (ACUTE ONLY): 30 min  Charges:  $Gait Training: 8-22 mins $Therapeutic Activity: 8-22 mins                     [name] [name], PT Acute Rehab Services Pager [phone] [facility] [phone] [facility] [phone]    [name] [name] 05/23/2019, 4:36 PM

## 2019-05-24 LAB — BASIC METABOLIC PANEL
Anion gap: 9 (ref 5–15)
BUN: 77 mg/dL — ABNORMAL HIGH (ref 8–23)
CO2: 24 mmol/L (ref 22–32)
Calcium: 8.7 mg/dL — ABNORMAL LOW (ref 8.9–10.3)
Chloride: 105 mmol/L (ref 98–111)
Creatinine, Ser: 1.9 mg/dL — ABNORMAL HIGH (ref 0.44–1.00)
GFR calc Af Amer: 29 mL/min — ABNORMAL LOW (ref >60)
GFR calc non Af Amer: 25 mL/min — ABNORMAL LOW (ref >60)
Glucose, Bld: 209 mg/dL — ABNORMAL HIGH (ref 70–99)
Potassium: 4.2 mmol/L (ref 3.5–5.1)
Sodium: 138 mmol/L (ref 135–145)

## 2019-05-24 LAB — CBC WITH DIFFERENTIAL/PLATELET
Abs Immature Granulocytes: 0.04 10*3/uL (ref 0.00–0.07)
Basophils Absolute: 0 10*3/uL (ref 0.0–0.1)
Basophils Relative: 0 %
Eosinophils Absolute: 0 10*3/uL (ref 0.0–0.5)
Eosinophils Relative: 0 %
HCT: 30.1 % — ABNORMAL LOW (ref 36.0–46.0)
Hemoglobin: 9 g/dL — ABNORMAL LOW (ref 12.0–15.0)
Immature Granulocytes: 1 %
Lymphocytes Relative: 5 %
Lymphs Abs: 0.5 10*3/uL — ABNORMAL LOW (ref 0.7–4.0)
MCH: 27.9 pg (ref 26.0–34.0)
MCHC: 29.9 g/dL — ABNORMAL LOW (ref 30.0–36.0)
MCV: 93.2 fL (ref 80.0–100.0)
Monocytes Absolute: 0.1 10*3/uL (ref 0.1–1.0)
Monocytes Relative: 1 %
Neutro Abs: 8 10*3/uL — ABNORMAL HIGH (ref 1.7–7.7)
Neutrophils Relative %: 93 %
Platelets: 314 10*3/uL (ref 150–400)
RBC: 3.23 MIL/uL — ABNORMAL LOW (ref 3.87–5.11)
RDW: 16.4 % — ABNORMAL HIGH (ref 11.5–15.5)
WBC: 8.7 10*3/uL (ref 4.0–10.5)
nRBC: 0 % (ref 0.0–0.2)

## 2019-05-24 LAB — GLUCOSE, CAPILLARY
Glucose-Capillary: 157 mg/dL — ABNORMAL HIGH (ref 70–99)
Glucose-Capillary: 248 mg/dL — ABNORMAL HIGH (ref 70–99)

## 2019-05-24 NOTE — Discharge Summary (Signed)
Physician Discharge Summary  [name] [name] TKZ:[id number] DOB: May 22, 1940 DOA: 05/21/2019  PCP: Nolene Ebbs, MD  Admit date: 05/21/2019 Discharge date: 05/24/2019  Time spent: 60 minutes  Recommendations for Outpatient Follow-up:  1. Follow-up with Nolene Ebbs, MD follow-up in 1 to 2 weeks.  On follow-up patient blood pressure need to be reassessed.  Patient will need a basic metabolic profile done to follow-up on electrolytes and renal function.   Discharge Diagnoses:  Principal Problem:   Acute on chronic respiratory failure with hypoxia (HCC) Active Problems:   Chronic diastolic CHF (congestive heart failure) (HCC)   COPD (chronic obstructive pulmonary disease) (HCC)   Diabetes mellitus ([place])   Peripheral neuropathy   Fall at home, initial encounter   COPD exacerbation ([place])   Essential hypertension   Hyperlipidemia   Chronic kidney disease, stage 3b   Discharge Condition: Stable and improved.  Diet recommendation: Heart healthy  Filed Weights   05/21/19 1041  Weight: 99.8 kg    History of present illness:  HPI per Dr. Marcello Moores is a 79 y.o. female with medical history significant of Covid pneumonia on November 5732, diastolic CHF, COPD on 2 L of oxygen at home, CKD stage IIIb, SLE, diabetes type 2, hypertension, hyperlipidemia, history of PE who presented from home with complaints of shortness of breath, fever, fall.  Patient said she fell tripped over her oxygen canister and fell to the ground.  Denied any dizziness or presyncopal symptoms.  Patient also complained of some shortness of breath since yesterday and was also wheezing.  She has been using her home nebulizer machine without minimal relief.  Also had subjective fever at home but afebrile on presentation.  Patient is a poor historian.  She follows with pulmonology as an outpatient for management of COPD.  She also follows with cardiology. Patient seen and examined at the bedside in the emergency  department.  She was  hypertensive.  She was not in any acute respiratory distress.  She denied any chest pain, abdomen pain, nausea, vomiting or diarrhea.  ED Course: Required 3 L of oxygen per minute in the emergency department.  Chest x-ray showed interstitial scarring and central vascular congestion but not   pneumonia.  Admitted for the management of COPD exacerbation.  Started on bronchodilators and steroids.  Hospital Course:  1 acute on chronic respiratory failure with hypoxia secondary to acute COPD exacerbation Patient  was admitted with acute on chronic respiratory failure with hypoxia felt secondary to acute COPD exacerbation.  Patient placed on IV steroid taper, Mucinex, scheduled duo nebs, Dulera, Flonase, Claritin, PPI.  Patient improved slowly and clinically during the hospitalization and was back on home O2 by day of discharge.  Patient be discharged home on a prednisone taper and is to follow-up with PCP in the outpatient setting.   2.  Chronic kidney disease stage IIIb Patient with a bump in creatinine during the hospitalization.  Patient's HCTZ and lisinopril were held and patient instructed to resume on May 25, 2019.   3.  Chronic diastolic heart failure Compensated.    Remained euvolemic.    Patient maintained on home regimen diuretics of Lasix, Pravachol,imdur,lhydralazine, Cardizem, aspirin.  Due to bump in creatinine patient's lisinopril HCTZ was held and will be resumed on discharge on May 25, 2019.  4.  Well-controlled diabetes mellitus type 2 Hemoglobin A1c was 8.9 on 02/15/2019.  Hemoglobin A1c 5.2 on 05/21/2019.    Patient maintained on Lantus as well as meal coverage NovoLog  5 units 3 times daily as well as sliding scale insulin.  Outpatient follow-up.  5.  Hyperlipidemia Patient maintained on a statin.    6.  Hypertension Patient's HCTZ and lisinopril were held due to increasing creatinine however renal function remained stable close to baseline.   Patient general hospitalization maintained on home regimen of hydralazine, Imdur and Lasix.  Patient to resume lisinopril HCTZ May 25, 2019.  Close outpatient follow-up with PCP.  7.  SLE No active flare.  Noted to have been on hydroxychloroquine in the past.  Outpatient follow-up.  8.  Recent Covid pneumonia Patient noted to have been admitted to [facility] in November 2020 and received treatment with remdesivir and Decadron.  Covid 19 PCR done on admission negative.   Remained asymptomatic.  9.  Fall Patient noted to have a mechanical fall at home stated she tripped over her oxygen canister.  Plain films of the pelvis and knee and hip were done which were negative for any fracture or dislocation.  Patient being followed by PT OT.  Patient noted to use a walker/cane/wheelchair at home.    Patient will be discharged home with home health therapies.    10.  History of gout Maintained on home regimen hold allopurinol.   11.  Normocytic anemia Stable throughout the hospitalization.    Procedures:  Chest x-ray 05/21/2019  Plain films of the left hip and pelvis 05/21/2019  Plain films of the left knee 05/21/2019  Consultations:  None  Discharge Exam: Vitals:   05/24/19 0539 05/24/19 0828  BP: 128/63   Pulse: 64   Resp: 18   Temp: 97.9 F (36.6 C)   SpO2: 99% 95%    General: NAD Cardiovascular: RRR Respiratory: Clear to auscultation bilaterally.  Discharge Instructions   Discharge Instructions    Diet - low sodium heart healthy   Complete by: As directed    Increase activity slowly   Complete by: As directed    Increase activity slowly   Complete by: As directed      Allergies as of 05/24/2019      Reactions   Penicillins Swelling   Has patient had a PCN reaction causing immediate rash, facial/tongue/throat swelling, SOB or lightheadedness with hypotension: Yes Has patient had a PCN reaction causing severe rash involving mucus membranes or skin  necrosis: Yes Has patient had a PCN reaction that required hospitalization Yes Has patient had a PCN reaction occurring within the last 10 years: No, more than 10 yrs ago If all of the above answers are "NO", then may proceed with Cephalosporin use.   Fentanyl Itching   Peach [prunus Persica] Hives   Shellfish Allergy Hives      Medication List    STOP taking these medications   Fluticasone-Salmeterol 500-50 MCG/DOSE Aepb Commonly known as: Advair Diskus     TAKE these medications   acetaminophen 500 MG tablet Commonly known as: TYLENOL Take 500 mg by mouth every 6 (six) hours as needed for mild pain or moderate pain.   [place]away 0.025 % ophthalmic solution Generic drug: ketotifen Place 1 drop into both eyes daily as needed (allergy symptoms).   albuterol 108 (90 Base) MCG/ACT inhaler Commonly known as: VENTOLIN HFA Inhale 2 puffs into the lungs See admin instructions. Every 6 hours as needed for SOB but also takes 2 puffs BID scheduled.   alendronate 70 MG tablet Commonly known as: FOSAMAX Take 70 mg by mouth every Monday. Take with a full glass of water on  an empty stomach.   allopurinol 100 MG tablet Commonly known as: ZYLOPRIM Take 100 mg by mouth daily.   aspirin EC 81 MG tablet Take 81 mg by mouth daily.   brimonidine 0.1 % Soln Commonly known as: ALPHAGAN P Place 1 drop into both eyes 2 (two) times a day.   calcium carbonate 1250 (500 Ca) MG chewable tablet Commonly known as: OS-CAL Chew 1 tablet by mouth daily.   Cartia XT 300 MG 24 hr capsule Generic drug: diltiazem Take 300 mg by mouth daily. Reported on 09/15/2015   cetirizine 10 MG tablet Commonly known as: ZYRTEC Take 10 mg by mouth daily.   cholecalciferol 1000 units tablet Commonly known as: VITAMIN D Take 1,000 Units by mouth daily.   citalopram 10 MG tablet Commonly known as: CELEXA Take 10 mg by mouth daily.   clobetasol ointment 0.05 % Commonly known as: TEMOVATE Apply 1 application  topically 2 (two) times daily.   colchicine 0.6 MG tablet Commonly known as: Colcrys Take 0.5 tablets (0.3 mg total) by mouth daily.   desonide 0.05 % cream Commonly known as: DESOWEN Apply 1 application topically 2 (two) times daily.   dexlansoprazole 60 MG capsule Commonly known as: DEXILANT Take 60 mg by mouth daily.   diclofenac sodium 1 % Gel Commonly known as: VOLTAREN Apply 2 g topically daily as needed (leg pain). Reported on 09/15/2015   fluticasone 50 MCG/ACT nasal spray Commonly known as: FLONASE Place 2 sprays into both nostrils daily.   furosemide 80 MG tablet Commonly known as: LASIX Take 1 tablet (80 mg total) by mouth 2 (two) times daily. What changed: how much to take   gabapentin 300 MG capsule Commonly known as: NEURONTIN Take 1 capsule (300 mg total) by mouth 2 (two) times daily. What changed:   how much to take  when to take this   guaiFENesin 600 MG 12 hr tablet Commonly known as: MUCINEX Take 2 tablets (1,200 mg total) by mouth 2 (two) times daily for 4 days.   hydrALAZINE 50 MG tablet Commonly known as: APRESOLINE Take 50 mg by mouth 3 (three) times daily.   insulin aspart 100 UNIT/ML injection Commonly known as: novoLOG Inject 10 Units into the skin 3 (three) times daily before meals.   insulin glargine 100 UNIT/ML injection Commonly known as: LANTUS Inject 0.4 mLs (40 Units total) into the skin daily. What changed:   how much to take  when to take this   ipratropium-albuterol 0.5-2.5 (3) MG/3ML Soln Commonly known as: DUONEB Take 3 mLs by nebulization 3 (three) times daily. Use 3 times daily x4 days then every 6 hours as needed.   isosorbide mononitrate 30 MG 24 hr tablet Commonly known as: IMDUR Take 30 mg by mouth every morning.   lisinopril-hydrochlorothiazide 20-12.5 MG tablet Commonly known as: ZESTORETIC Take 1 tablet by mouth daily. Start taking on: May 25, 2019   loratadine 10 MG tablet Commonly known as:  CLARITIN Take 1 tablet (10 mg total) by mouth daily.   metoprolol tartrate 50 MG tablet Commonly known as: LOPRESSOR Take 1 tablet by mouth twice daily   OXYGEN Inhale 2 L into the lungs daily as needed (shortness of breath). 2L/min - prn during day and every evening.   pravastatin 40 MG tablet Commonly known as: PRAVACHOL Take 40 mg by mouth at bedtime.   predniSONE 20 MG tablet Commonly known as: DELTASONE Take 1-3 tablets (20-60 mg total) by mouth daily before breakfast. Take 3 tablets (60mg )  daily x4 days, then 2 tablets (40mg ) daily x4 days, then 1 tablet (40mg ) daily x4 days then stop.   Symbicort 160-4.5 MCG/ACT inhaler Generic drug: budesonide-formoterol Inhale 2 puffs into the lungs 2 (two) times daily. Reported on 09/15/2015   tizanidine 2 MG capsule Commonly known as: ZANAFLEX Take 2 mg by mouth 2 (two) times daily.   traMADol 50 MG tablet Commonly known as: ULTRAM Take 1 tablet (50 mg total) by mouth every 6 (six) hours as needed for severe pain.   trimethoprim 100 MG tablet Commonly known as: TRIMPEX Take 100 mg by mouth daily.   vitamin B-12 1000 MCG tablet Commonly known as: CYANOCOBALAMIN Take 1,000 mcg by mouth daily. Reported on 09/15/2015      Allergies  Allergen Reactions  . Penicillins Swelling    Has patient had a PCN reaction causing immediate rash, facial/tongue/throat swelling, SOB or lightheadedness with hypotension: Yes Has patient had a PCN reaction causing severe rash involving mucus membranes or skin necrosis: Yes Has patient had a PCN reaction that required hospitalization Yes Has patient had a PCN reaction occurring within the last 10 years: No, more than 10 yrs ago If all of the above answers are "NO", then may proceed with Cephalosporin use.   . Fentanyl Itching  . Peach [Prunus Persica] Hives  . Shellfish Allergy Hives   Follow-up Information    Nolene Ebbs, MD. Schedule an appointment as soon as possible for a visit in 1  week(s).   Specialty: Internal Medicine Why: f/u in 1-2 weeks. Contact information: [place] [address] [phone]        Lorretta Harp, MD .   Specialties: Cardiology, Radiology Contact information: [address] [place] [address] [phone]            The results of significant diagnostics from this hospitalization (including imaging, microbiology, ancillary and laboratory) are listed below for reference.    Significant Diagnostic Studies: US Guided Needle Placement - No Linked Charges  Result Date: 05/10/2019 Please see Notes tab for imaging impression.  DG Chest Portable 1 View  Result Date: 05/21/2019 CLINICAL DATA:  [place] yesterday, COPD, CHF, short of breath EXAM: PORTABLE CHEST 1 VIEW COMPARISON:  04/19/2019 FINDINGS: Single frontal view of the chest demonstrates a stable cardiac silhouette. There is stable diffuse interstitial prominence throughout the lungs. Chronic central vascular congestion without focal airspace disease. No large effusion or pneumothorax. IMPRESSION: 1. Continued interstitial scarring and central vascular congestion. No acute airspace disease. Electronically Signed   By: Randa Ngo M.D.   On: 05/21/2019 11:53   DG Knee Complete 4 Views Left  Result Date: 05/21/2019 CLINICAL DATA:  [place] yesterday, severe left hip and knee pain EXAM: LEFT KNEE - COMPLETE 4+ VIEW COMPARISON:  None FINDINGS: Frontal, oblique, lateral views of the left knee demonstrate no fracture, subluxation, or dislocation. Three compartmental osteoarthritis greatest in the medial compartment. No joint effusion. IMPRESSION: 1. Osteoarthritis, no acute fracture. Electronically Signed   By: Randa Ngo M.D.   On: 05/21/2019 11:51   DG Hip Unilat W or Wo Pelvis 2-3 Views Left  Result Date: 05/21/2019 CLINICAL DATA:  [place] yesterday, severe left hip and knee pain EXAM: DG HIP (WITH OR WITHOUT PELVIS) 2-3V LEFT COMPARISON:  07/20/2012  FINDINGS: Frontal views of the pelvis as well as frontal and frogleg lateral views of the left hip are obtained. Evaluation is slightly limited due to technique and patient body habitus. Continued right greater than left  hip osteoarthritis, with progression of the axial joint space narrowing within the right hip seen previously. No acute displaced fracture. Alignment is anatomic. Soft tissues are unremarkable. IMPRESSION: 1. No acute displaced hip fracture. 2. Right greater than left hip osteoarthritis. Electronically Signed   By: Randa Ngo M.D.   On: 05/21/2019 11:49    Microbiology: Recent Results (from the past 240 hour(s))  Respiratory Panel by RT PCR (Flu A&B, Covid) - Urine, Clean Catch     Status: None   Collection Time: 05/21/19 11:47 AM   Specimen: Urine, Clean Catch  Result Value Ref Range Status   SARS Coronavirus 2 by RT PCR NEGATIVE NEGATIVE Final    Comment: (NOTE) SARS-CoV-2 target nucleic acids are NOT DETECTED. The SARS-CoV-2 RNA is generally detectable in upper respiratoy specimens during the acute phase of infection. The lowest concentration of SARS-CoV-2 viral copies this assay can detect is 131 copies/mL. A negative result does not preclude SARS-Cov-2 infection and should not be used as the sole basis for treatment or other patient management decisions. A negative result may occur with  improper specimen collection/handling, submission of specimen other than nasopharyngeal swab, presence of viral mutation(s) within the areas targeted by this assay, and inadequate number of viral copies (<131 copies/mL). A negative result must be combined with clinical observations, patient history, and epidemiological information. The expected result is Negative. Fact Sheet for Patients:  PinkCheek.be Fact Sheet for Healthcare Providers:  GravelBags.it This test is not yet ap proved or cleared by the [place] FDA and   has been authorized for detection and/or diagnosis of SARS-CoV-2 by FDA under an Emergency Use Authorization (EUA). This EUA will remain  in effect (meaning this test can be used) for the duration of the COVID-19 declaration under Section 564(b)(1) of the Act, 21 U.S.C. section 360bbb-3(b)(1), unless the authorization is terminated or revoked sooner.    Influenza A by PCR NEGATIVE NEGATIVE Final   Influenza B by PCR NEGATIVE NEGATIVE Final    Comment: (NOTE) The Xpert Xpress SARS-CoV-2/FLU/RSV assay is intended as an aid in  the diagnosis of influenza from Nasopharyngeal swab specimens and  should not be used as a sole basis for treatment. Nasal washings and  aspirates are unacceptable for Xpert Xpress SARS-CoV-2/FLU/RSV  testing. Fact Sheet for Patients: PinkCheek.be Fact Sheet for Healthcare Providers: GravelBags.it This test is not yet approved or cleared by the [place] FDA and  has been authorized for detection and/or diagnosis of SARS-CoV-2 by  FDA under an Emergency Use Authorization (EUA). This EUA will remain  in effect (meaning this test can be used) for the duration of the  Covid-19 declaration under Section 564(b)(1) of the Act, 21  U.S.C. section 360bbb-3(b)(1), unless the authorization is  terminated or revoked. Performed at [facility], [facility] [address], [place], [place] [address]      Labs: Basic Metabolic Panel: Recent Labs  Lab 05/21/19 1147 05/22/19 0407 05/23/19 0302 05/24/19 0258  NA 141 139 136 138  K 4.5 5.1 4.6 4.2  CL 103 103 101 105  CO2 27 28 24 24   GLUCOSE 80 176* 232* 209*  BUN 38* 44* 65* 77*  CREATININE 1.39* 1.38* 1.93* 1.90*  CALCIUM 10.1 9.7 9.2 8.7*   Liver Function Tests: Recent Labs  Lab 05/21/19 1147  AST 19  ALT 20  ALKPHOS 81  BILITOT 0.6  PROT 8.0  ALBUMIN 3.7   No results for input(s): LIPASE, AMYLASE in the last 168 hours. No results  for input(s): AMMONIA in the last 168 hours. CBC: Recent Labs  Lab 05/21/19 1147 05/22/19 0407 05/23/19 0302 05/24/19 0258  WBC 11.3* 6.3 8.3 8.7  NEUTROABS 9.2*  --  7.6 8.0*  HGB 10.4* 9.2* 8.7* 9.0*  HCT 34.7* 30.5* 28.9* 30.1*  MCV 93.8 92.7 92.3 93.2  PLT 342 292 312 314   Cardiac Enzymes: No results for input(s): CKTOTAL, CKMB, CKMBINDEX, TROPONINI in the last 168 hours. BNP: BNP (last 3 results) Recent Labs    02/18/19 0240 02/19/19 0105 02/20/19 0050  BNP 303.7* 133.6* 106.8*    ProBNP (last 3 results) Recent Labs    10/31/18 1528  PROBNP 478    CBG: Recent Labs  Lab 05/23/19 0818 05/23/19 1121 05/23/19 1727 05/23/19 2203 05/24/19 0729  GLUCAP 166* 230* 277* 141* 157*       Signed:  Irine Seal MD.  [facility] 05/24/2019, 10:57 AM

## 2019-05-24 NOTE — Plan of Care (Signed)

## 2019-05-24 NOTE — Progress Notes (Signed)
Occupational Therapy Treatment Patient Details Name: [name] [name] MRN: [id number] DOB: [birth year] Today's Date: 05/24/2019    History of present illness 79 yo female admitted with acute on chronic respiratory failure, fall at home. Hx of COPD-O2 dep, CKD, breast ca, mod-severe OA, lupus, obesity, asthma, carpal tunnel syndrome, COVID 19 02/2019   OT comments  Pt is making good gains with standing and SPT.  Bed mobility is still challenging. She reports 24/7 at home, and may leave today.  Follow Up Recommendations  Home health OT;Supervision/Assistance - 24 hour    Equipment Recommendations  None recommended by OT    Recommendations for Other Services      Precautions / Restrictions Precautions Precautions: Fall Precaution Comments: O2 dep-monitor O2       Mobility Bed Mobility         Supine to sit: Mod assist;HOB elevated     General bed mobility comments: assist for trunk from hospital bed (which she has) with [facility] raised and use of rails.    Transfers   Equipment used: Rolling walker (2 wheeled)   Sit to Stand: Min assist Stand pivot transfers: Min assist       General transfer comment: light assist to stand and steady.  Assist for lines with SPT    Balance                                           ADL either performed or assessed with clinical judgement   ADL                           Toilet Transfer: Minimal assistance;Stand-pivot;RW(chair)                   Vision       Perception     Praxis      Cognition Arousal/Alertness: Awake/alert Behavior During Therapy: WFL for tasks assessed/performed Overall Cognitive Status: Within Functional Limits for tasks assessed                                          Exercises     Shoulder Instructions       General Comments bumped to 3 liters for transfer:  pt at 2.5 when OT arrived and sats were 91%.  95% after transfer to chair.  Pt tends to  hold her breath during activities    Pertinent Vitals/ Pain       Pain Assessment: Faces Faces Pain Scale: Hurts a little bit Pain Location: L LE Pain Descriptors / Indicators: Discomfort;Sore;Aching Pain Intervention(s): Limited activity within patient's tolerance;Monitored during session;Repositioned  Home Living                                          Prior Functioning/Environment              Frequency  Min 2X/week        Progress Toward Goals  OT Goals(current goals can now be found in the care plan section)  Progress towards OT goals: Progressing toward goals     Plan      Co-evaluation  AM-PAC OT "6 Clicks" Daily Activity     Outcome Measure   Help from another person eating meals?: None Help from another person taking care of personal grooming?: A Little Help from another person toileting, which includes using toliet, bedpan, or urinal?: A Lot Help from another person bathing (including washing, rinsing, drying)?: A Lot Help from another person to put on and taking off regular upper body clothing?: A Little Help from another person to put on and taking off regular lower body clothing?: Total 6 Click Score: 15    End of Session    OT Visit Diagnosis: Unsteadiness on feet (R26.81);History of falling (Z91.81);Muscle weakness (generalized) (M62.81)   Activity Tolerance Patient tolerated treatment well   Patient Left in chair;with call bell/phone within reach;with chair alarm set   Nurse Communication          Time: [phone] OT Time Calculation (min): 20 min  Charges: OT General Charges $OT Visit: 1 Visit OT Treatments $Therapeutic Activity: 8-22 mins  Denman Pichardo S, OTR/L Acute Rehabilitation Services 05/24/2019   Susane Bey 05/24/2019, 8:52 AM

## 2019-05-24 NOTE — Progress Notes (Signed)
D/c paper work reviewed with patient and all questions answered at this time. Patient is waiting on ride. Will get patient dressed with clothes friend is bringing and transport patient to front entrance when ride here. Will continue to monitor.

## 2019-06-26 NOTE — Progress Notes (Signed)
HEMATOLOGY/ONCOLOGY CLINIC NOTE  Date of Service: 06/26/2019  Patient Care Team: Nolene Ebbs, MD as PCP - General (Internal Medicine) Lorretta Harp, MD as PCP - Cardiology (Cardiology)  CHIEF COMPLAINTS/PURPOSE OF CONSULTATION:  MGUS   HISTORY OF PRESENTING ILLNESS:  [name] [name] is a wonderful 79 y.o. female who has been previously followed by my colleague Dr. 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 She is accompanied today by her sister-in-law. The pt reports that she is doing well overall.   The pt reports that she has been having some blisters and open wounds related to her leg swelling, and is being followed by a wound clinic for this. She denies any other concerning symptoms or developments since she last saw Dr. Lebron Conners.   She had a PE two years ago and is no longer taking blood thinners. She has lupus and takes Plaquenil, and sees Dr. Sabino Niemann in rheumatology. She notes that she has fairly controlled diabetes and is followed by her PCP Dr.Edwin Avbuere regarding this. She also sees Dr. Quay Burow in cardiology. She also sees Dr. Basil Dess in orthopedics. She also sees [facility]. The pt also notes that she has gout attacks in her toes, for which she takes Allopurinol.   Of note since the patient's last visit, pt has had PET/CT completed on 05/18/17 with results revealing No FDG PET-CT evidence of active multiple myeloma.  Lab results (11/07/17) of CBC w/diff, CMP, and Reticulocytes is as follows: all values are WNL except for RBC at 3.52, HGB at 9.6, HCT at 30.2, RDW at 17.0, Glucose at 337, BUN at 30, Creatinine at 1.71, AST at 14, GFR at 32. MMP 11/07/17 revealed all values WNL except for Alpha2 Glob Ser at 1.1, M Protein at 0.7, Globulin total at 4.0.  Kappa/Lambda 11/07/17 revealed all values WNL except for Kappa at 39.2. Beta-2 microglobulin 11/07/17 was at 4.5 LDH 11/07/17 was at 266  On review of systems, pt reports stable energy levels,  occasional gout attacks in her toes, leg swelling, open wounds on left leg, and denies dizziness, light headedness, abdominal pains, new bone pains, and any other symptoms.    Interval History:   [name] [name] returns today for management and evaluation of her MGUS. The patient's last visit with [place] was on 12/28/2018. We are joined by her caregiver.  The pt reports that she is doing well overall.  The pt reports she had gotten COVID19 in October and November. Pt has also had a fall and has been rehabilitating. Previously to having COVID19 pt had been using oxygen when needed. Now she had been using oxygen everyday. Pt has not had any leg ulcers but has been getting blisters on her feet. She has been having tender spots on head that comes and goes. Pt has gotten COVID19 vaccine. She has been walking.   Lab results today (06/27/19) of CBC w/diff and CMP is as follows: all values are WNL except for RBC at 3.21, Hemoglobin at 8.9, HCT at 29.4, Neutro Abs at 8.8K, Glucose at 130, BUN at 28, Creatinine at 1.61, Albumin at 3.3, Total Bilirubin at 0.2, GFR, Est Non Af Am at 30, GFR, Est AFR Am at 35  06/27/19 of Ferritin at 253 06/27/19 of MMP stable at 0.5g/dl  On review of systems, pt reports blisters on feet, decreased appetite, left shoulder pain, leg pain and denies ulcers and any other symptoms.     MEDICAL HISTORY:  Past Medical History:  Diagnosis Date  . Allergic rhinitis   . Anxiety   . Arthritis   . Asthma   . Cancer [facility]) 2006   breast cancer right  . CHF (congestive heart failure) ([place])   . COPD (chronic obstructive pulmonary disease) ([place])   . Degenerative disc disease, lumbar   . Diabetes mellitus without complication ([place])   . Dyspnea    walking distances  . Eczema   . GERD (gastroesophageal reflux disease)   . Glaucoma   . History of home oxygen therapy    uses 2 liters ay night and prn  . Hyperlipidemia   . Hypertension   . Low back pain   . Lupus ([place])    skin  .  Neck pain   . Numbness and tingling   . Obesity   . Osteopenia   . Pulmonary embolism ([place]) 01/2012    CT showed multi small PE and coumadized   . Sleep apnea 2010   no cpap used  . Systemic lupus erythematosus ([place])     SURGICAL HISTORY: Past Surgical History:  Procedure Laterality Date  . ABDOMINAL HYSTERECTOMY  1976  . BACK SURGERY  2006   lower  . BREAST BIOPSY Right   . BREAST EXCISIONAL BIOPSY Left   . BREAST LUMPECTOMY Right   . COLONOSCOPY WITH PROPOFOL N/A 03/23/2015   Procedure: COLONOSCOPY WITH PROPOFOL;  Surgeon: Laurence Spates, MD;  Location: [facility];  Service: Endoscopy;  Laterality: N/A;  . Dobtamine myoview  05/02/2008   EF 67% ; LV norm  . DOPPLER ECHOCARDIOGRAPHY  01/25/2012   EF 55 TO 60%; LV norm.  . EXPLORATORY LAPAROTOMY    . EYE SURGERY Bilateral 2010   lens reaplcments for cataracts   . Lower Extrem. venous doppler  01/25/2012    neg.  . TOE SURGERY  1996   Bunion    SOCIAL HISTORY: Social History   Socioeconomic History  . Marital status: Single    Spouse name: Not on file  . Number of children: 5  . Years of education: 51  . Highest education level: Not on file  Occupational History  . Occupation: Disabled  Tobacco Use  . Smoking status: Former Research scientist (life sciences)  . Smokeless tobacco: Never Used  . Tobacco comment: Quit 1980  Substance and Sexual Activity  . Alcohol use: No    Alcohol/week: 0.0 standard drinks  . Drug use: No  . Sexual activity: Not on file  Other Topics Concern  . Not on file  Social History Narrative   Lives at home alone.   Right-handed.   2-4 cups caffeine daily.   Social Determinants of Health   Financial Resource Strain:   . Difficulty of Paying Living Expenses: Not on file  Food Insecurity:   . Worried About Charity fundraiser in the Last Year: Not on file  . Ran Out of Food in the Last Year: Not on file  Transportation Needs:   . Lack of Transportation (Medical): Not on file  . Lack of Transportation  (Non-Medical): Not on file  Physical Activity:   . Days of Exercise per Week: Not on file  . Minutes of Exercise per Session: Not on file  Stress:   . Feeling of Stress : Not on file  Social Connections:   . Frequency of Communication with Friends and Family: Not on file  . Frequency of Social Gatherings with Friends and Family: Not on file  . Attends Religious Services: Not on file  . Active  Member of Clubs or Organizations: Not on file  . Attends Archivist Meetings: Not on file  . Marital Status: Not on file  Intimate Partner Violence:   . Fear of Current or Ex-Partner: Not on file  . Emotionally Abused: Not on file  . Physically Abused: Not on file  . Sexually Abused: Not on file    FAMILY HISTORY: Family History  Problem Relation Age of Onset  . Heart failure Father   . Heart failure Mother   . Diabetes Brother   . Breast cancer Maternal Aunt   . Breast cancer Paternal Aunt     ALLERGIES:  is allergic to penicillins; fentanyl; peach [prunus persica]; and shellfish allergy.  MEDICATIONS:  Current Outpatient Medications  Medication Sig Dispense Refill  . acetaminophen (TYLENOL) 500 MG tablet Take 500 mg by mouth every 6 (six) hours as needed for mild pain or moderate pain.    Marland Kitchen albuterol (PROVENTIL HFA;VENTOLIN HFA) 108 (90 BASE) MCG/ACT inhaler Inhale 2 puffs into the lungs See admin instructions. Every 6 hours as needed for SOB but also takes 2 puffs BID scheduled.    Marland Kitchen alendronate (FOSAMAX) 70 MG tablet Take 70 mg by mouth every Monday. Take with a full glass of water on an empty stomach.    Marland Kitchen allopurinol (ZYLOPRIM) 100 MG tablet Take 100 mg by mouth daily.    Marland Kitchen aspirin EC 81 MG tablet Take 81 mg by mouth daily.    . brimonidine (ALPHAGAN P) 0.1 % SOLN Place 1 drop into both eyes 2 (two) times a day.    . calcium carbonate (OS-CAL) 1250 (500 Ca) MG chewable tablet Chew 1 tablet by mouth daily.    . cetirizine (ZYRTEC) 10 MG tablet Take 10 mg by mouth daily.     . cholecalciferol (VITAMIN D) 1000 UNITS tablet Take 1,000 Units by mouth daily.    . citalopram (CELEXA) 10 MG tablet Take 10 mg by mouth daily.     . clobetasol ointment (TEMOVATE) 6.38 % Apply 1 application topically 2 (two) times daily.     . colchicine (COLCRYS) 0.6 MG tablet Take 0.5 tablets (0.3 mg total) by mouth daily. 15 tablet 0  . desonide (DESOWEN) 0.05 % cream Apply 1 application topically 2 (two) times daily.     Marland Kitchen dexlansoprazole (DEXILANT) 60 MG capsule Take 60 mg by mouth daily.     . diclofenac sodium (VOLTAREN) 1 % GEL Apply 2 g topically daily as needed (leg pain). Reported on 09/15/2015    . diltiazem (CARTIA XT) 300 MG 24 hr capsule Take 300 mg by mouth daily. Reported on 09/15/2015    . fluticasone (FLONASE) 50 MCG/ACT nasal spray Place 2 sprays into both nostrils daily. 16 g 0  . furosemide (LASIX) 80 MG tablet Take 1 tablet (80 mg total) by mouth 2 (two) times daily. (Patient taking differently: Take 40 mg by mouth 2 (two) times daily. ) 180 tablet 3  . gabapentin (NEURONTIN) 300 MG capsule Take 1 capsule (300 mg total) by mouth 2 (two) times daily. (Patient taking differently: Take 100 mg by mouth 3 (three) times daily. ) 60 capsule 6  . hydrALAZINE (APRESOLINE) 50 MG tablet Take 50 mg by mouth 3 (three) times daily.     . insulin aspart (NOVOLOG) 100 UNIT/ML injection Inject 10 Units into the skin 3 (three) times daily before meals.     . insulin glargine (LANTUS) 100 UNIT/ML injection Inject 0.4 mLs (40 Units total) into the skin  daily. 12 mL 0  . ipratropium-albuterol (DUONEB) 0.5-2.5 (3) MG/3ML SOLN Take 3 mLs by nebulization 3 (three) times daily. Use 3 times daily x4 days then every 6 hours as needed. 360 mL 2  . isosorbide mononitrate (IMDUR) 30 MG 24 hr tablet Take 30 mg by mouth every morning.    Marland Kitchen ketotifen (ALAWAY) 0.025 % ophthalmic solution Place 1 drop into both eyes daily as needed (allergy symptoms).     Marland Kitchen lisinopril-hydrochlorothiazide (ZESTORETIC) 20-12.5  MG tablet Take 1 tablet by mouth daily.    Marland Kitchen loratadine (CLARITIN) 10 MG tablet Take 1 tablet (10 mg total) by mouth daily. 30 tablet 0  . metoprolol tartrate (LOPRESSOR) 50 MG tablet Take 1 tablet by mouth twice daily (Patient taking differently: Take 50 mg by mouth 2 (two) times daily. ) 60 tablet 0  . OXYGEN Inhale 2 L into the lungs daily as needed (shortness of breath). 2L/min - prn during day and every evening.     . pravastatin (PRAVACHOL) 40 MG tablet Take 40 mg by mouth at bedtime.     . predniSONE (DELTASONE) 20 MG tablet Take 1-3 tablets (20-60 mg total) by mouth daily before breakfast. Take 3 tablets ('60mg'$ ) daily x4 days, then 2 tablets ('40mg'$ ) daily x4 days, then 1 tablet ('40mg'$ ) daily x4 days then stop. 24 tablet   . SYMBICORT 160-4.5 MCG/ACT inhaler Inhale 2 puffs into the lungs 2 (two) times daily. Reported on 09/15/2015    . tizanidine (ZANAFLEX) 2 MG capsule Take 2 mg by mouth 2 (two) times daily.    . traMADol (ULTRAM) 50 MG tablet Take 1 tablet (50 mg total) by mouth every 6 (six) hours as needed for severe pain. 10 tablet 0  . trimethoprim (TRIMPEX) 100 MG tablet Take 100 mg by mouth daily.    . vitamin B-12 (CYANOCOBALAMIN) 1000 MCG tablet Take 1,000 mcg by mouth daily. Reported on 09/15/2015     No current facility-administered medications for this visit.    REVIEW OF SYSTEMS:   A 10+ POINT REVIEW OF SYSTEMS WAS OBTAINED including neurology, dermatology, psychiatry, cardiac, respiratory, lymph, extremities, GI, GU, Musculoskeletal, constitutional, breasts, reproductive, HEENT.  All pertinent positives are noted in the HPI.  All others are negative.   PHYSICAL EXAMINATION: ECOG FS:0 - Asymptomatic  There were no vitals filed for this visit. Wt Readings from Last 3 Encounters:  05/21/19 220 lb (99.8 kg)  02/19/19 209 lb 7 oz (95 kg)  02/04/19 226 lb (102.5 kg)   Body mass index is 38.28 kg/m.    Exam given in wheel chair  GENERAL:alert, in no acute distress and  comfortable SKIN: no acute rashes, no significant lesions EYES: conjunctiva are pink and non-injected, sclera anicteric OROPHARYNX: MMM, no exudates, no oropharyngeal erythema or ulceration NECK: supple, no JVD LYMPH:  no palpable lymphadenopathy in the cervical, axillary or inguinal regions LUNGS: clear to auscultation b/l with normal respiratory effort HEART: regular rate & rhythm ABDOMEN:  normoactive bowel sounds , non tender, not distended. Extremity: no pedal edema PSYCH: alert & oriented x 3 with fluent speech NEURO: no focal motor/sensory deficits  LABORATORY DATA:  I have reviewed the data as listed  . CBC Latest Ref Rng & Units 06/27/2019 05/24/2019 05/23/2019  WBC 4.0 - 10.5 K/uL 10.5 8.7 8.3  Hemoglobin 12.0 - 15.0 g/dL 8.9(L) 9.0(L) 8.7(L)  Hematocrit 36.0 - 46.0 % 29.4(L) 30.1(L) 28.9(L)  Platelets 150 - 400 K/uL 273 314 312   . CBC    Component  Value Date/Time   WBC 8.7 05/24/2019 0258   RBC 3.23 (L) 05/24/2019 0258   HGB 9.0 (L) 05/24/2019 0258   HGB 10.3 (L) 12/28/2018 1104   HGB 10.9 (L) 03/15/2017 1202   HCT 30.1 (L) 05/24/2019 0258   HCT 33.1 (L) 03/15/2017 1202   PLT 314 05/24/2019 0258   PLT 461 (H) 12/28/2018 1104   PLT 316 03/15/2017 1202   MCV 93.2 05/24/2019 0258   MCV 86.4 03/15/2017 1202   MCH 27.9 05/24/2019 0258   MCHC 29.9 (L) 05/24/2019 0258   RDW 16.4 (H) 05/24/2019 0258   RDW 15.0 (H) 03/15/2017 1202   LYMPHSABS 0.5 (L) 05/24/2019 0258   LYMPHSABS 1.4 03/15/2017 1202   MONOABS 0.1 05/24/2019 0258   MONOABS 0.5 03/15/2017 1202   EOSABS 0.0 05/24/2019 0258   EOSABS 0.0 03/15/2017 1202   BASOSABS 0.0 05/24/2019 0258   BASOSABS 0.1 03/15/2017 1202    CMP Latest Ref Rng & Units 06/27/2019 05/24/2019 05/23/2019  Glucose 70 - 99 mg/dL 130(H) 209(H) 232(H)  BUN 8 - 23 mg/dL 28(H) 77(H) 65(H)  Creatinine 0.44 - 1.00 mg/dL 1.61(H) 1.90(H) 1.93(H)  Sodium 135 - 145 mmol/L 145 138 136  Potassium 3.5 - 5.1 mmol/L 4.0 4.2 4.6  Chloride 98 - 111  mmol/L 105 105 101  CO2 22 - 32 mmol/L '30 24 24  '$ Calcium 8.9 - 10.3 mg/dL 9.6 8.7(L) 9.2  Total Protein 6.5 - 8.1 g/dL 7.4 - -  Total Bilirubin 0.3 - 1.2 mg/dL 0.2(L) - -  Alkaline Phos 38 - 126 U/L 105 - -  AST 15 - 41 U/L 21 - -  ALT 0 - 44 U/L 20 - -   04/06/17 BM Bx:   04/04/17 Cytogenetics:    RADIOGRAPHIC STUDIES: I have personally reviewed the radiological images as listed and agreed with the findings in the report. No results found.  ASSESSMENT & PLAN:   79 y.o. female with lupus, CKD, Diabetes Mellitus type II, gout and   1. MGUS (no smoldering myeloma despite > 10% plasma cells in the BM Bx since a large extent of those appeared to be reactive) -Discussed pt labwork from 11/07/17; HGB at 9.6, M Protein at 0.7 (no significant change from previous M spike of 0.6)  2. Anemia due to CKD? And anemia of chronic inflammation related to lupus/leg ulcers.  3. Patient Active Problem List   Diagnosis Date Noted  . Essential hypertension   . Hyperlipidemia   . Chronic kidney disease, stage 3b   . Fall 05/21/2019  . COPD exacerbation ([place]) 05/21/2019  . Pressure injury of skin 02/21/2019  . Acute respiratory disease due to COVID-19 virus 02/16/2019  . Acute on chronic respiratory failure with hypoxia ([place]) 02/15/2019  . Acute-on-chronic kidney injury ([place]) 02/15/2019  . Sepsis ([place]) 02/15/2019  . Obesity (BMI 30-39.9) 10/31/2018  . CRI (chronic renal insufficiency), stage 3 (moderate) 10/31/2018  . Iron deficiency anemia 04/10/2018  . 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 Abnormality of gait 08/05/2015  . Spinal stenosis of lumbar region 08/05/2015  . Peripheral neuropathy 08/05/2015  . Chest pain 12/28/2013  . Hyperlipidemia associated with type 2 diabetes mellitus ([place]) 04/05/2013  . Systemic lupus erythematosus ([place]) 04/05/2013  . Pulmonary embolism ([place]) 01/24/2012  . Chronic diastolic CHF (congestive heart failure) ([place]) 01/24/2012  . COPD (chronic  obstructive pulmonary disease) ([place]) 01/24/2012  . Diabetes mellitus ([place]) 01/24/2012  . Hypertension associated with diabetes ([place]) 01/24/2012  . Leukocytosis 01/24/2012  . Bronchitis  01/24/2012  . Anemia 01/24/2012    PLAN: -Discussed pt labwork today, 06/27/19; of CBC w/diff and CMP is as follows: all values are WNL except for RBC at 3.21, Hemoglobin at 8.9, HCT at 29.4, Neutro Abs at 8.8K, Glucose at 130, BUN at 28, Creatinine at 1.61, Albumin at 3.3, Total Bilirubin at 0.2, GFR, Est Non Af Am at 30, GFR, Est AFR Am at 35  -Discussed 06/27/19 of Ferritin at 253 - adequate at goal >100 . No indication for IV iron at this time. -Discussed 06/27/19 of MMP stable M spike @ 0.5g/dl -Advised on consideration of ESA use - patient wants to think about it. -Recommends f/u with rheumatologist for Lupus -Recommends f/u Nephrologist for Chronic Kidney Disease  - continue f/u with PCP Kerrie Pleasure -Will see back in 6 months  FOLLOW UP: RTC with Dr Irene Limbo with labs in 6 months   The total time spent in the appt was 15 minutes and more than 50% was on counseling and direct patient cares.  All of the patient's questions were answered with apparent satisfaction. The patient knows to call the clinic with any problems, questions or concerns.     Sullivan Lone MD MS AAHIVMS [facility] Hematology/Oncology Physician [facility]  (Office):       [phone] (Work cell):  [phone] (Fax):           [phone]  06/26/2019 8:45 PM  I, Dawayne Cirri am acting as a Education administrator for Dr. Sullivan Lone.   .I have reviewed the above documentation for accuracy and completeness, and I agree with the above. Brunetta Genera MD

## 2019-06-27 ENCOUNTER — Inpatient Hospital Stay: Payer: Medicare HMO | Attending: Hematology

## 2019-06-27 ENCOUNTER — Inpatient Hospital Stay (HOSPITAL_BASED_OUTPATIENT_CLINIC_OR_DEPARTMENT_OTHER): Payer: Medicare HMO | Admitting: Hematology

## 2019-06-27 ENCOUNTER — Other Ambulatory Visit: Payer: Self-pay

## 2019-06-27 ENCOUNTER — Telehealth: Payer: Self-pay | Admitting: Hematology

## 2019-06-27 VITALS — BP 124/66 | HR 85 | Temp 98.9°F | Resp 18 | Ht 64.0 in | Wt 223.0 lb

## 2019-06-27 DIAGNOSIS — Z803 Family history of malignant neoplasm of breast: Secondary | ICD-10-CM | POA: Diagnosis not present

## 2019-06-27 DIAGNOSIS — N1832 Chronic kidney disease, stage 3b: Secondary | ICD-10-CM | POA: Insufficient documentation

## 2019-06-27 DIAGNOSIS — D649 Anemia, unspecified: Secondary | ICD-10-CM | POA: Diagnosis not present

## 2019-06-27 DIAGNOSIS — Z794 Long term (current) use of insulin: Secondary | ICD-10-CM | POA: Diagnosis not present

## 2019-06-27 DIAGNOSIS — M109 Gout, unspecified: Secondary | ICD-10-CM | POA: Insufficient documentation

## 2019-06-27 DIAGNOSIS — Z853 Personal history of malignant neoplasm of breast: Secondary | ICD-10-CM | POA: Insufficient documentation

## 2019-06-27 DIAGNOSIS — M329 Systemic lupus erythematosus, unspecified: Secondary | ICD-10-CM | POA: Insufficient documentation

## 2019-06-27 DIAGNOSIS — E1122 Type 2 diabetes mellitus with diabetic chronic kidney disease: Secondary | ICD-10-CM | POA: Insufficient documentation

## 2019-06-27 DIAGNOSIS — Z87891 Personal history of nicotine dependence: Secondary | ICD-10-CM | POA: Diagnosis not present

## 2019-06-27 DIAGNOSIS — D472 Monoclonal gammopathy: Secondary | ICD-10-CM | POA: Diagnosis present

## 2019-06-27 LAB — CMP (CANCER CENTER ONLY)
ALT: 20 U/L (ref 0–44)
AST: 21 U/L (ref 15–41)
Albumin: 3.3 g/dL — ABNORMAL LOW (ref 3.5–5.0)
Alkaline Phosphatase: 105 U/L (ref 38–126)
Anion gap: 10 (ref 5–15)
BUN: 28 mg/dL — ABNORMAL HIGH (ref 8–23)
CO2: 30 mmol/L (ref 22–32)
Calcium: 9.6 mg/dL (ref 8.9–10.3)
Chloride: 105 mmol/L (ref 98–111)
Creatinine: 1.61 mg/dL — ABNORMAL HIGH (ref 0.44–1.00)
GFR, Est AFR Am: 35 mL/min — ABNORMAL LOW (ref >60)
GFR, Estimated: 30 mL/min — ABNORMAL LOW (ref >60)
Glucose, Bld: 130 mg/dL — ABNORMAL HIGH (ref 70–99)
Potassium: 4 mmol/L (ref 3.5–5.1)
Sodium: 145 mmol/L (ref 135–145)
Total Bilirubin: 0.2 mg/dL — ABNORMAL LOW (ref 0.3–1.2)
Total Protein: 7.4 g/dL (ref 6.5–8.1)

## 2019-06-27 LAB — CBC WITH DIFFERENTIAL/PLATELET
Abs Immature Granulocytes: 0.03 10*3/uL (ref 0.00–0.07)
Basophils Absolute: 0 10*3/uL (ref 0.0–0.1)
Basophils Relative: 0 %
Eosinophils Absolute: 0.3 10*3/uL (ref 0.0–0.5)
Eosinophils Relative: 3 %
HCT: 29.4 % — ABNORMAL LOW (ref 36.0–46.0)
Hemoglobin: 8.9 g/dL — ABNORMAL LOW (ref 12.0–15.0)
Immature Granulocytes: 0 %
Lymphocytes Relative: 8 %
Lymphs Abs: 0.8 10*3/uL (ref 0.7–4.0)
MCH: 27.7 pg (ref 26.0–34.0)
MCHC: 30.3 g/dL (ref 30.0–36.0)
MCV: 91.6 fL (ref 80.0–100.0)
Monocytes Absolute: 0.5 10*3/uL (ref 0.1–1.0)
Monocytes Relative: 5 %
Neutro Abs: 8.8 10*3/uL — ABNORMAL HIGH (ref 1.7–7.7)
Neutrophils Relative %: 84 %
Platelets: 273 10*3/uL (ref 150–400)
RBC: 3.21 MIL/uL — ABNORMAL LOW (ref 3.87–5.11)
RDW: 15.5 % (ref 11.5–15.5)
WBC: 10.5 10*3/uL (ref 4.0–10.5)
nRBC: 0 % (ref 0.0–0.2)

## 2019-06-27 LAB — FERRITIN: Ferritin: 253 ng/mL (ref 11–307)

## 2019-06-27 NOTE — Telephone Encounter (Signed)
Scheduled per 3/11 los. Gave avs and calendar

## 2019-07-02 LAB — MULTIPLE MYELOMA PANEL, SERUM
Albumin SerPl Elph-Mcnc: 3.2 g/dL (ref 2.9–4.4)
Albumin/Glob SerPl: 0.9 (ref 0.7–1.7)
Alpha 1: 0.3 g/dL (ref 0.0–0.4)
Alpha2 Glob SerPl Elph-Mcnc: 1 g/dL (ref 0.4–1.0)
B-Globulin SerPl Elph-Mcnc: 0.9 g/dL (ref 0.7–1.3)
Gamma Glob SerPl Elph-Mcnc: 1.6 g/dL (ref 0.4–1.8)
Globulin, Total: 3.7 g/dL (ref 2.2–3.9)
IgA: 172 mg/dL (ref 64–422)
IgG (Immunoglobin G), Serum: 1550 mg/dL (ref 586–1602)
IgM (Immunoglobulin M), Srm: 67 mg/dL (ref 26–217)
M Protein SerPl Elph-Mcnc: 0.5 g/dL — ABNORMAL HIGH
Total Protein ELP: 6.9 g/dL (ref 6.0–8.5)

## 2019-07-18 ENCOUNTER — Ambulatory Visit: Payer: Medicare HMO | Admitting: Specialist

## 2019-08-05 NOTE — Progress Notes (Signed)
Virtual Visit via Video Note   This visit type was conducted due to national recommendations for restrictions regarding the COVID-19 Pandemic (e.g. social distancing) in an effort to limit this patient's exposure and mitigate transmission in our community.  Due to her co-morbid illnesses, this patient is at least at moderate risk for complications without adequate follow up.  This format is felt to be most appropriate for this patient at this time.  All issues noted in this document were discussed and addressed.  A limited physical exam was performed with this format.  Please refer to the patient's chart for her consent to telehealth for [facility].   Date:  08/06/2019   ID:  [name] [name], DOB Aug 08, 1940, MRN [id number]  Patient Location: Home Provider Location: Home  PCP:  Nolene Ebbs, MD  Cardiologist:  Quay Burow, MD  Electrophysiologist:  None   Evaluation Performed:  Follow-Up Visit  Chief Complaint:  Follow up  History of Present Illness:    [name] [name] is a 79 y.o. female we are following for ongoing assessment and management of chronic diastolic CHF, HTN, HL, OSA, echo with LVH and grade I diastolic CHF.  Other history includes NIDDM, and OSA, COPD, CKD Stage IIIb and SLE. She had a negative myoview 05/02/2008.   She had CT of the chest in October 2020 revealing small PE, and was started on coumadin. She was negative for DVT. She has since stopped coumadin per PCP.   She was hospitalized in February of 2021 with acute respiratory failure in the setting of COVID pneumonia, COPD.She was treated with IV steroids and duo nebs, along with symptomatic relief with expectorants, antihistamines, and PPI. She was sent home on O2 2/liters per [place].   She is seen today with home caregiver. She has not yet taken her medications. She complains of LEE. She reports that she is not always adhering to low sodium diet. She denies chest pain, some dyspnea with exertion due to  deconditioning. She is not taking midday hydralazine dose. Caregiver reports that her BP is very high in the am. She is not on CPAP for OSA.    The patient does not have symptoms concerning for COVID-19 infection (fever, chills, cough, or new shortness of breath).    Past Medical History:  Diagnosis Date   Allergic rhinitis    Anxiety    Arthritis    Asthma    Cancer ([place]) 2006   breast cancer right   CHF (congestive heart failure) (HCC)    COPD (chronic obstructive pulmonary disease) (HCC)    Degenerative disc disease, lumbar    Diabetes mellitus without complication (HCC)    Dyspnea    walking distances   Eczema    GERD (gastroesophageal reflux disease)    Glaucoma    History of home oxygen therapy    uses 2 liters ay night and prn   Hyperlipidemia    Hypertension    Low back pain    Lupus (HCC)    skin   Neck pain    Numbness and tingling    Obesity    Osteopenia    Pulmonary embolism ([place]) 01/2012    CT showed multi small PE and coumadized    Sleep apnea 2010   no cpap used   Systemic lupus erythematosus ([place])    Past Surgical History:  Procedure Laterality Date   ABDOMINAL HYSTERECTOMY  1976   BACK SURGERY  2006   lower   BREAST BIOPSY Right  BREAST EXCISIONAL BIOPSY Left    BREAST LUMPECTOMY Right    COLONOSCOPY WITH PROPOFOL N/A 03/23/2015   Procedure: COLONOSCOPY WITH PROPOFOL;  Surgeon: Laurence Spates, MD;  Location: [facility];  Service: Endoscopy;  Laterality: N/A;   Dobtamine myoview  05/02/2008   EF 67% ; LV norm   DOPPLER ECHOCARDIOGRAPHY  01/25/2012   EF 55 TO 60%; LV norm.   EXPLORATORY LAPAROTOMY     EYE SURGERY Bilateral 2010   lens reaplcments for cataracts    Lower Extrem. venous doppler  01/25/2012    neg.   TOE SURGERY  1996   Bunion     Current Meds  Medication Sig   acetaminophen (TYLENOL) 500 MG tablet Take 500 mg by mouth every 6 (six) hours as needed for mild pain or moderate pain.    albuterol (PROVENTIL HFA;VENTOLIN HFA) 108 (90 BASE) MCG/ACT inhaler Inhale 2 puffs into the lungs See admin instructions. Every 6 hours as needed for SOB but also takes 2 puffs BID scheduled.   alendronate (FOSAMAX) 70 MG tablet Take 70 mg by mouth every Monday. Take with a full glass of water on an empty stomach.   allopurinol (ZYLOPRIM) 100 MG tablet Take 100 mg by mouth daily.   aspirin EC 81 MG tablet Take 81 mg by mouth daily.   brimonidine (ALPHAGAN P) 0.1 % SOLN Place 1 drop into both eyes 2 (two) times a day.   calcium carbonate (OS-CAL) 1250 (500 Ca) MG chewable tablet Chew 1 tablet by mouth daily.   cetirizine (ZYRTEC) 10 MG tablet Take 10 mg by mouth daily.   cholecalciferol (VITAMIN D) 1000 UNITS tablet Take 1,000 Units by mouth daily.   citalopram (CELEXA) 10 MG tablet Take 10 mg by mouth daily.    colchicine (COLCRYS) 0.6 MG tablet Take 0.5 tablets (0.3 mg total) by mouth daily.   desonide (DESOWEN) 0.05 % cream Apply 1 application topically 2 (two) times daily.    dexlansoprazole (DEXILANT) 60 MG capsule Take 60 mg by mouth daily.    diclofenac sodium (VOLTAREN) 1 % GEL Apply 2 g topically daily as needed (leg pain). Reported on 09/15/2015   diltiazem (CARTIA XT) 300 MG 24 hr capsule Take 300 mg by mouth daily. Reported on 09/15/2015   fluticasone (FLONASE) 50 MCG/ACT nasal spray Place 2 sprays into both nostrils daily.   furosemide (LASIX) 80 MG tablet Take 1 tablet (80 mg total) by mouth 2 (two) times daily. (Patient taking differently: Take 40 mg by mouth 2 (two) times daily. )   gabapentin (NEURONTIN) 300 MG capsule Take 1 capsule (300 mg total) by mouth 2 (two) times daily. (Patient taking differently: Take 100 mg by mouth 3 (three) times daily. )   hydrALAZINE (APRESOLINE) 50 MG tablet Take 50 mg by mouth 3 (three) times daily.    insulin aspart (NOVOLOG) 100 UNIT/ML injection Inject 10 Units into the skin 3 (three) times daily before meals.    insulin  glargine (LANTUS) 100 UNIT/ML injection Inject 0.4 mLs (40 Units total) into the skin daily.   ipratropium-albuterol (DUONEB) 0.5-2.5 (3) MG/3ML SOLN Take 3 mLs by nebulization 3 (three) times daily. Use 3 times daily x4 days then every 6 hours as needed.   isosorbide mononitrate (IMDUR) 30 MG 24 hr tablet Take 30 mg by mouth every morning.   lisinopril-hydrochlorothiazide (ZESTORETIC) 20-12.5 MG tablet Take 1 tablet by mouth daily.   loratadine (CLARITIN) 10 MG tablet Take 1 tablet (10 mg total) by mouth daily.  metoprolol tartrate (LOPRESSOR) 50 MG tablet Take 1 tablet by mouth twice daily (Patient taking differently: Take 50 mg by mouth 2 (two) times daily. )   OXYGEN Inhale 2 L into the lungs daily as needed (shortness of breath). 2L/min - prn during day and every evening.    pravastatin (PRAVACHOL) 40 MG tablet Take 40 mg by mouth at bedtime.    SYMBICORT 160-4.5 MCG/ACT inhaler Inhale 2 puffs into the lungs 2 (two) times daily. Reported on 09/15/2015   tizanidine (ZANAFLEX) 2 MG capsule Take 2 mg by mouth 2 (two) times daily.   traMADol (ULTRAM) 50 MG tablet Take 1 tablet (50 mg total) by mouth every 6 (six) hours as needed for severe pain.   trimethoprim (TRIMPEX) 100 MG tablet Take 100 mg by mouth daily.   vitamin B-12 (CYANOCOBALAMIN) 1000 MCG tablet Take 1,000 mcg by mouth daily. Reported on 09/15/2015   [DISCONTINUED] predniSONE (DELTASONE) 20 MG tablet Take 1-3 tablets (20-60 mg total) by mouth daily before breakfast. Take 3 tablets (60mg ) daily x4 days, then 2 tablets (40mg ) daily x4 days, then 1 tablet (40mg ) daily x4 days then stop.     Allergies:   Penicillins, Fentanyl, Peach [prunus persica], and Shellfish allergy   Social History   Tobacco Use   Smoking status: Former Smoker   Smokeless tobacco: Never Used   Tobacco comment: Quit 1980  Substance Use Topics   Alcohol use: No    Alcohol/week: 0.0 standard drinks   Drug use: No     Family Hx: The  patient's family history includes Breast cancer in her maternal aunt and paternal aunt; Diabetes in her brother; Heart failure in her father and mother.  ROS:   Please see the history of present illness.      All other systems reviewed and are negative.   Prior CV studies:   The following studies were reviewed today: Echocardiogram 10/29/2015 Left ventricle: The cavity size was normal. Wall thickness was  increased in a pattern of mild LVH. Systolic function was normal.  The estimated ejection fraction was in the range of 55% to 60%.  Doppler parameters are consistent with abnormal left ventricular  relaxation (grade 1 diastolic dysfunction).  - Pulmonary arteries: PA peak pressure: 50 mm Hg (S).   Labs/Other Tests and Data Reviewed:    EKG:  No ECG reviewed.  Recent Labs: 10/31/2018: NT-Pro BNP 478 02/20/2019: B Natriuretic Peptide 106.8; Magnesium 1.9 06/27/2019: ALT 20; BUN 28; Creatinine 1.61; Hemoglobin 8.9; Platelets 273; Potassium 4.0; Sodium 145   Recent Lipid Panel Lab Results  Component Value Date/Time   TRIG 124 02/15/2019 06:10 PM    Wt Readings from Last 3 Encounters:  08/06/19 198 lb 6.4 oz (90 kg)  06/27/19 223 lb (101.2 kg)  05/21/19 220 lb (99.8 kg)     Objective:    Vital Signs:  BP (!) 199/99    Pulse 89    Temp (!) 97.5 F (36.4 C)    Ht 5\' 4"  (1.626 m)    Wt 198 lb 6.4 oz (90 kg)    BMI 34.06 kg/m    VITAL SIGNS:  reviewed GEN:  no acute distress RESPIRATORY:  normal respiratory effort, symmetric expansion NEURO:  alert and oriented x 3, no obvious focal deficit PSYCH:  normal affect  ASSESSMENT & PLAN:    1.Hypertension; BP is elevated today. She has not yet taken her medications. Caregiver was late arriving to give them to her. Caregiver reports that her BP is  elevated every morning. I will change her hydralazine to 50 mg in the am and 100 mg in the pm, as she is not taking the noon dose, so that her BP might be better in the am.  No  other changes. I will check BMET.   2. Chronic Diastolic CHF: She is not always weighing daily. This was reinforced. She will continue current dose of lasix. She is to be more strict on low sodium diet. I have sent her salty 6 flyer.  She has some dependent edema but not more than normal according to caregiver. She is to keep her legs elevated more during the day if possible. She is wearing support hose. Will need to consider repeating echo for medication management as one has not been competed since 2017.   3. Diabetes: Will check Hgb A1c with labs to have for documentation for PCP  4. COPD: O2 dependent.  Followed by PCP.  COVID-19 Education: The signs and symptoms of COVID-19 were discussed with the patient and how to seek care for testing (follow up with PCP or arrange E-visit).  The importance of social distancing was discussed today.  Time:   Today, I have spent 25 minutes with the patient with telehealth technology discussing the above problems, including chart review and documentation     Medication Adjustments/Labs and Tests Ordered: Current medicines are reviewed at length with the patient today.  Concerns regarding medicines are outlined above.   Tests Ordered: No orders of the defined types were placed in this encounter.   Medication Changes: No orders of the defined types were placed in this encounter.   Disposition:  Follow up 4 months  Signed, Phill Myron. [place] Pugh, ANP, [facility]  08/06/2019 11:27 AM    [facility]

## 2019-08-06 ENCOUNTER — Encounter: Payer: Self-pay | Admitting: Adult Health

## 2019-08-06 ENCOUNTER — Telehealth (INDEPENDENT_AMBULATORY_CARE_PROVIDER_SITE_OTHER): Payer: Medicare HMO | Admitting: Adult Health

## 2019-08-06 VITALS — BP 199/99 | HR 89 | Temp 97.5°F | Ht 64.0 in | Wt 198.4 lb

## 2019-08-06 DIAGNOSIS — E785 Hyperlipidemia, unspecified: Secondary | ICD-10-CM

## 2019-08-06 DIAGNOSIS — I5032 Chronic diastolic (congestive) heart failure: Secondary | ICD-10-CM | POA: Diagnosis not present

## 2019-08-06 DIAGNOSIS — E1122 Type 2 diabetes mellitus with diabetic chronic kidney disease: Secondary | ICD-10-CM

## 2019-08-06 DIAGNOSIS — E1169 Type 2 diabetes mellitus with other specified complication: Secondary | ICD-10-CM

## 2019-08-06 DIAGNOSIS — I130 Hypertensive heart and chronic kidney disease with heart failure and stage 1 through stage 4 chronic kidney disease, or unspecified chronic kidney disease: Secondary | ICD-10-CM | POA: Diagnosis not present

## 2019-08-06 DIAGNOSIS — N1832 Chronic kidney disease, stage 3b: Secondary | ICD-10-CM

## 2019-08-06 DIAGNOSIS — I10 Essential (primary) hypertension: Secondary | ICD-10-CM

## 2019-08-06 DIAGNOSIS — G4733 Obstructive sleep apnea (adult) (pediatric): Secondary | ICD-10-CM

## 2019-08-06 DIAGNOSIS — J449 Chronic obstructive pulmonary disease, unspecified: Secondary | ICD-10-CM

## 2019-08-06 DIAGNOSIS — E1159 Type 2 diabetes mellitus with other circulatory complications: Secondary | ICD-10-CM

## 2019-08-06 MED ORDER — DILTIAZEM HCL ER COATED BEADS 300 MG PO CP24: 300.0000 mg | ORAL_CAPSULE | Freq: Every day | ORAL | 3 refills | Status: DC

## 2019-08-06 MED ORDER — HYDRALAZINE HCL 50 MG PO TABS: ORAL_TABLET | ORAL | 3 refills | Status: DC

## 2019-08-06 MED ORDER — METOPROLOL TARTRATE 50 MG PO TABS: 50.0000 mg | ORAL_TABLET | Freq: Two times a day (BID) | ORAL | 3 refills | Status: AC

## 2019-08-06 MED ORDER — PRAVASTATIN SODIUM 40 MG PO TABS: 40.0000 mg | ORAL_TABLET | Freq: Every day | ORAL | 3 refills | Status: AC

## 2019-08-06 MED ORDER — FUROSEMIDE 80 MG PO TABS: 40.0000 mg | ORAL_TABLET | Freq: Two times a day (BID) | ORAL | 3 refills | Status: DC

## 2019-08-06 MED ORDER — LISINOPRIL-HYDROCHLOROTHIAZIDE 20-12.5 MG PO TABS: 1.0000 | ORAL_TABLET | Freq: Every day | ORAL | 3 refills | Status: DC

## 2019-08-06 NOTE — Patient Instructions (Signed)
Medication Instructions:  DECREASE- Hydralazine 50 mg in the morning and 100 mg at bedtime  *If you need a refill on your cardiac medications before your next appointment, please call your pharmacy*   Lab Work: CBC, BMP, A1C, Fasting Lipid Liver  If you have labs (blood work) drawn today and your tests are completely normal, you will receive your results only by: Marland Kitchen MyChart Message (if you have MyChart) OR . A paper copy in the mail If you have any lab test that is abnormal or we need to change your treatment, we will call you to review the results.   Testing/Procedures: None Ordered   Follow-Up: At [facility], you and your health needs are our priority.  As part of our continuing mission to provide you with exceptional heart care, we have created designated Provider Care Teams.  These Care Teams include your primary Cardiologist (physician) and Advanced Practice Providers (APPs -  Physician Assistants and Nurse Practitioners) who all work together to provide you with the care you need, when you need it.  We recommend signing up for the patient portal called "MyChart".  Sign up information is provided on this After Visit Summary.  MyChart is used to connect with patients for Virtual Visits (Telemedicine).  Patients are able to view lab/test results, encounter notes, upcoming appointments, etc.  Non-urgent messages can be sent to your provider as well.   To learn more about what you can do with MyChart, go to [email or web address].    Your next appointment:   4 month(s)  The format for your next appointment:   In Person  Provider:   You may see Quay Burow, MD or one of the following Advanced Practice Providers on your designated Care Team:    Kerin Ransom, PA-C  [place], [place]  Coletta Memos, [place]

## 2019-08-08 ENCOUNTER — Other Ambulatory Visit: Payer: Self-pay | Admitting: Internal Medicine

## 2019-08-08 DIAGNOSIS — Z1231 Encounter for screening mammogram for malignant neoplasm of breast: Secondary | ICD-10-CM

## 2019-08-09 ENCOUNTER — Ambulatory Visit: Payer: Medicare HMO | Admitting: Specialist

## 2019-08-12 ENCOUNTER — Ambulatory Visit
Admission: RE | Admit: 2019-08-12 | Discharge: 2019-08-12 | Disposition: A | Discharge status: Home / Self Care | Payer: Medicare HMO | Source: Ambulatory Visit | Attending: Internal Medicine | Admitting: Internal Medicine

## 2019-08-12 ENCOUNTER — Other Ambulatory Visit: Payer: Self-pay

## 2019-08-12 DIAGNOSIS — Z1231 Encounter for screening mammogram for malignant neoplasm of breast: Secondary | ICD-10-CM

## 2019-09-09 ENCOUNTER — Other Ambulatory Visit: Payer: Self-pay

## 2019-09-09 ENCOUNTER — Encounter: Payer: Self-pay | Admitting: Specialist

## 2019-09-09 ENCOUNTER — Ambulatory Visit (INDEPENDENT_AMBULATORY_CARE_PROVIDER_SITE_OTHER): Payer: Medicare Other

## 2019-09-09 ENCOUNTER — Ambulatory Visit (INDEPENDENT_AMBULATORY_CARE_PROVIDER_SITE_OTHER): Payer: Medicare Other | Admitting: Specialist

## 2019-09-09 VITALS — BP 132/73 | HR 80 | Ht 64.0 in | Wt 198.0 lb

## 2019-09-09 DIAGNOSIS — N183 Chronic kidney disease, stage 3 unspecified: Secondary | ICD-10-CM | POA: Diagnosis not present

## 2019-09-09 DIAGNOSIS — M542 Cervicalgia: Secondary | ICD-10-CM | POA: Diagnosis not present

## 2019-09-09 DIAGNOSIS — M170 Bilateral primary osteoarthritis of knee: Secondary | ICD-10-CM | POA: Diagnosis not present

## 2019-09-09 DIAGNOSIS — M48062 Spinal stenosis, lumbar region with neurogenic claudication: Secondary | ICD-10-CM | POA: Diagnosis not present

## 2019-09-09 DIAGNOSIS — M1A09X0 Idiopathic chronic gout, multiple sites, without tophus (tophi): Secondary | ICD-10-CM

## 2019-09-09 DIAGNOSIS — M1711 Unilateral primary osteoarthritis, right knee: Secondary | ICD-10-CM

## 2019-09-09 DIAGNOSIS — M47812 Spondylosis without myelopathy or radiculopathy, cervical region: Secondary | ICD-10-CM

## 2019-09-09 DIAGNOSIS — M19012 Primary osteoarthritis, left shoulder: Secondary | ICD-10-CM

## 2019-09-09 DIAGNOSIS — M1712 Unilateral primary osteoarthritis, left knee: Secondary | ICD-10-CM

## 2019-09-09 DIAGNOSIS — G5623 Lesion of ulnar nerve, bilateral upper limbs: Secondary | ICD-10-CM

## 2019-09-09 MED ORDER — ALLOPURINOL 100 MG PO TABS: 100.0000 mg | ORAL_TABLET | Freq: Every day | ORAL | 4 refills | Status: DC

## 2019-09-09 MED ORDER — TRAMADOL HCL 50 MG PO TABS: 50.0000 mg | ORAL_TABLET | Freq: Four times a day (QID) | ORAL | 0 refills | Status: AC | PRN

## 2019-09-09 NOTE — Progress Notes (Signed)
Office Visit Note   Patient: [name] [name]           Date of Birth: Aug 03, 1940           MRN: [id number] Visit Date: 09/09/2019              Requested by: Nolene Ebbs, MD [address] [place],  [place] [address] PCP: Nolene Ebbs, MD   Assessment & Plan: Visit Diagnoses:  1. Cervicalgia   2. Unilateral primary osteoarthritis, right knee   3. Unilateral primary osteoarthritis, left knee   4. Spinal stenosis of lumbar region with neurogenic claudication   5. CRI (chronic renal insufficiency), stage 3 (moderate)   6. Idiopathic chronic gout of multiple sites without tophus   7. Primary osteoarthritis, left shoulder   8. Spondylosis of cervical region without myelopathy or radiculopathy   9. Cubital tunnel syndrome, bilateral     Plan: Avoid overhead lifting and overhead use of the arms. Do not lift greater than 5 lbs. Adjust head rest in vehicle to prevent hyperextension if rear ended. Take extra precautions to avoid falling. Knee is suffering from osteoarthritis, only real proven treatments are Weight loss,and exercise. DO NOT take NSAIDs as these may worsen kidney problems. Well padded shoes help. Ice the knee that is suffering from osteoarthritis, only real proven treatments are Weight loss and exercise. Well padded shoes help. Ice the knee 2-3 times a day 15-20 mins at a time.-3 times a day 15-20 mins at a time. Hot showers in the AM.   Hemp CBD capsules, amazon.com 5,000-7,000 mg per bottle, 60 capsules per bottle, take one capsule twice a day. Cane in the left hand to use with left leg weight bearing. Keep your appointment with the Rheumatologist as inflamation may worsen due to vaccination for  COVID, a rheumatologist may be able to help.  HHN for PT at home to improve posture and strengthen core cervical muscles.  Follow-Up Instructions: No follow-ups on file.  Follow-Up Instructions: Return in about 6 weeks (around 10/21/2019).   Orders:  Orders Placed This  Encounter  Procedures  . XR Cervical Spine 2 or 3 views  . Ambulatory referral to [facility] ordered this encounter  Medications  . allopurinol (ZYLOPRIM) 100 MG tablet    Sig: Take 1 tablet (100 mg total) by mouth daily.    Dispense:  90 tablet    Refill:  4  . traMADol (ULTRAM) 50 MG tablet    Sig: Take 1 tablet (50 mg total) by mouth every 6 (six) hours as needed for up to 7 days for severe pain.    Dispense:  28 tablet    Refill:  0      Procedures: No procedures performed   Clinical Data: No additional findings.   Subjective: Chief Complaint  Patient presents with  . Neck - Pain  . Left Arm - Numbness    79 right hand dominant female with history of cervical pain and arthritis in the knees. She is in a wheelchair and has not been able to walk or stand for long periods. She was in [facility] Thanksgiving,  Christmas and New Years day, came home early January. She is having bilateral knee cap pain, She has been having pain in the knees and is having difficulty standing and walking. Now with Difficulty cooking. She has Lupus and had an appointment and missed her appointment. She had a Vaccination for COVID more recently 4/7. She felt nausea with the injections,  missed Rheumatology eval and her joints are painful   Review of Systems   Objective: Vital Signs: BP 132/73 (BP Location: Left Arm, Patient Position: Sitting)   Pulse 80   Ht '5\' 4"'$  (1.626 m)   Wt 198 lb (89.8 kg)   BMI 33.99 kg/m   Physical Exam  Ortho Exam  Specialty Comments:  No specialty comments available.  Imaging: No results found.   PMFS History: Patient Active Problem List   Diagnosis Date Noted  . Essential hypertension   . Hyperlipidemia   . Chronic kidney disease, stage 3b   . Fall 05/21/2019  . COPD exacerbation ([place]) 05/21/2019  . Pressure injury of skin 02/21/2019  . Acute respiratory disease due to COVID-19 virus 02/16/2019  . Acute on chronic respiratory failure with  hypoxia ([place]) 02/15/2019  . Acute-on-chronic kidney injury ([place]) 02/15/2019  . Sepsis ([place]) 02/15/2019  . Obesity (BMI 30-39.9) 10/31/2018  . CRI (chronic renal insufficiency), stage 3 (moderate) 10/31/2018  . Iron deficiency anemia 04/10/2018  . 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 Abnormality of gait 08/05/2015  . Spinal stenosis of lumbar region 08/05/2015  . Peripheral neuropathy 08/05/2015  . Chest pain 12/28/2013  . Hyperlipidemia associated with type 2 diabetes mellitus ([place]) 04/05/2013  . Systemic lupus erythematosus ([place]) 04/05/2013  . Pulmonary embolism ([place]) 01/24/2012  . Chronic diastolic CHF (congestive heart failure) ([place]) 01/24/2012  . COPD (chronic obstructive pulmonary disease) ([place]) 01/24/2012  . Diabetes mellitus ([place]) 01/24/2012  . Hypertension associated with diabetes ([place]) 01/24/2012  . Leukocytosis 01/24/2012  . Bronchitis 01/24/2012  . Anemia 01/24/2012   Past Medical History:  Diagnosis Date  . Allergic rhinitis   . Anxiety   . Arthritis   . Asthma   . Cancer [facility]) 2006   breast cancer right  . CHF (congestive heart failure) ([place])   . COPD (chronic obstructive pulmonary disease) ([place])   . Degenerative disc disease, lumbar   . Diabetes mellitus without complication ([place])   . Dyspnea    walking distances  . Eczema   . GERD (gastroesophageal reflux disease)   . Glaucoma   . History of home oxygen therapy    uses 2 liters ay night and prn  . Hyperlipidemia   . Hypertension   . Low back pain   . Lupus ([place])    skin  . Neck pain   . Numbness and tingling   . Obesity   . Osteopenia   . Pulmonary embolism ([place]) 01/2012    CT showed multi small PE and coumadized   . Sleep apnea 2010   no cpap used  . Systemic lupus erythematosus (HCC)     Family History  Problem Relation Age of Onset  . Heart failure Father   . Heart failure Mother   . Diabetes Brother   . Breast cancer Maternal Aunt   . Breast cancer Paternal Aunt     Past  Surgical History:  Procedure Laterality Date  . ABDOMINAL HYSTERECTOMY  1976  . BACK SURGERY  2006   lower  . BREAST BIOPSY Right   . BREAST EXCISIONAL BIOPSY Left   . BREAST LUMPECTOMY Right   . COLONOSCOPY WITH PROPOFOL N/A 03/23/2015   Procedure: COLONOSCOPY WITH PROPOFOL;  Surgeon: Laurence Spates, MD;  [facility];  Service: Endoscopy;  Laterality: N/A;  . Dobtamine myoview  05/02/2008   EF 67% ; LV norm  . DOPPLER ECHOCARDIOGRAPHY  01/25/2012   EF 55 TO 60%; LV norm.  . EXPLORATORY  LAPAROTOMY    . EYE SURGERY Bilateral 2010   lens reaplcments for cataracts   . Lower Extrem. venous doppler  01/25/2012    neg.  . TOE SURGERY  1996   Bunion   Social History   Occupational History  . Occupation: Disabled  Tobacco Use  . Smoking status: Former Research scientist (life sciences)  . Smokeless tobacco: Never Used  . Tobacco comment: Quit 1980  Substance and Sexual Activity  . Alcohol use: No    Alcohol/week: 0.0 standard drinks  . Drug use: No  . Sexual activity: Not on file

## 2019-09-09 NOTE — Patient Instructions (Addendum)
Avoid overhead lifting and overhead use of the arms. Do not lift greater than 5 lbs. Adjust head rest in vehicle to prevent hyperextension if rear ended. Take extra precautions to avoid falling. Knee is suffering from osteoarthritis, only real proven treatments are Weight loss,and exercise. DO NOT take NSAIDs as these may worsen kidney problems. Well padded shoes help. Ice the knee that is suffering from osteoarthritis, only real proven treatments are Weight loss and exercise. Well padded shoes help. Ice the knee 2-3 times a day 15-20 mins at a time 3 times a day 15-20 mins at a time. Hot showers in the AM.   Hemp CBD capsules, amazon.com 5,000-7,000 mg per bottle, 60 capsules per bottle, take one capsule twice a day. Cane in the left hand to use with left leg weight bearing. Keep your appointment with the Rheumatologist as inflamation may worsen due to vaccination for  COVID, a rheumatologist may be able to help.  HHN for PT at home to improve posture and strengthen core cervical muscles.  Follow-Up Instructions: No follow-ups on file.

## 2019-09-13 ENCOUNTER — Telehealth: Payer: Self-pay | Admitting: Specialist

## 2019-09-13 NOTE — Telephone Encounter (Signed)
Judson Roch from [facility] called stating that the patient wanted to change the start of care to 09/17/19.  She is requesting an order if Dr. Louanne Skye is okay with the change of date.  CB#[phone].

## 2019-09-17 NOTE — Telephone Encounter (Signed)
I called and gave verbal auth for start date

## 2019-10-01 ENCOUNTER — Telehealth: Payer: Self-pay | Admitting: Specialist

## 2019-10-01 NOTE — Telephone Encounter (Signed)
Patient called requesting a call back from Dr Otho Ket nurse. Patient stated Dr. Louanne Skye stated he would order home health physical therapy for patient. Patient phone number is [phone].

## 2019-10-01 NOTE — Telephone Encounter (Signed)
I spoke with Anderson Malta, her care giver, she states that she was told by [facility] that her insurance would not cover HHPT due to her having pallative/hospice care coming in to her home, she has someone to come help bath her, and a chaplin that comes in, that Medicare and medicaid would not cover them to do PT, [facility] is who is coming in to do other care.---Please advise

## 2019-10-16 ENCOUNTER — Encounter: Payer: Self-pay | Admitting: Cardiovascular Disease

## 2019-10-16 ENCOUNTER — Ambulatory Visit (INDEPENDENT_AMBULATORY_CARE_PROVIDER_SITE_OTHER): Payer: Medicare HMO | Admitting: Cardiovascular Disease

## 2019-10-16 ENCOUNTER — Other Ambulatory Visit: Payer: Self-pay

## 2019-10-16 DIAGNOSIS — E1169 Type 2 diabetes mellitus with other specified complication: Secondary | ICD-10-CM | POA: Diagnosis not present

## 2019-10-16 DIAGNOSIS — I5032 Chronic diastolic (congestive) heart failure: Secondary | ICD-10-CM

## 2019-10-16 DIAGNOSIS — E1159 Type 2 diabetes mellitus with other circulatory complications: Secondary | ICD-10-CM

## 2019-10-16 DIAGNOSIS — I10 Essential (primary) hypertension: Secondary | ICD-10-CM

## 2019-10-16 DIAGNOSIS — I152 Hypertension secondary to endocrine disorders: Secondary | ICD-10-CM

## 2019-10-16 DIAGNOSIS — E785 Hyperlipidemia, unspecified: Secondary | ICD-10-CM

## 2019-10-16 NOTE — Assessment & Plan Note (Signed)
History of chronic diastolic heart failure on high-dose furosemide.

## 2019-10-16 NOTE — Progress Notes (Signed)
10/16/2019 [name] [name]   06/23/1940  [id number]  Primary Physician Nolene Ebbs, MD Primary Cardiologist: Lorretta Harp MD FACP, [place], [place], [place]  HPI:  IRIDIANA [name] is a 79 y.o.  severely overweight widowed African American female, mother of 1 and grandmother to 2 grandchildren, whom I last saw  02/04/2019.Marland Kitchen She is accompanied by her caregiver Anderson Malta today.  She has a history of congestive heart failure probably related to diastolic dysfunction with normal LV function. She had moderate concentric LVH and grade 1 diastolic dysfunction by echo January 25, 2012. Her other problems include hypertension, non-insulin-dependent diabetes, hyperlipidemia, and obstructive sleep apnea. She had a negative Myoview May 02, 2008. She was admitted October 8-13 of this year with shortness of breath. The CT angiogram showed multiple small pulmonary emboli and she was coumadinized. Her lower extremity venous Dopplers were negative. Her primary care physician has since stopped her Coumadin anticoagulation. Since I saw her a year ago she's been asymptomatic other than chronic shortness of breath.  She is had recurrent of her venous ulcers and lower extremity edema and is seen Dr. Dellia Nims at the wound care center. She was on twice daily furosemide which resulted in worsening of renal function so currently she is on daily. Her serum creatinine runs in the 2 range.  Since I saw her in the office in October last year she has developed COVID-19 in November and was hospitalized off and on for several months.  She is currently on continuous oxygen therapy.  She is on high-dose diuretics for lower extremity edema.   Current Meds  Medication Sig  . acetaminophen (TYLENOL) 500 MG tablet Take 500 mg by mouth every 6 (six) hours as needed for mild pain or moderate pain.  Marland Kitchen albuterol (PROVENTIL HFA;VENTOLIN HFA) 108 (90 BASE) MCG/ACT inhaler Inhale 2 puffs into the lungs See admin instructions. Every 6 hours  as needed for SOB but also takes 2 puffs BID scheduled.  Marland Kitchen alendronate (FOSAMAX) 70 MG tablet Take 70 mg by mouth every Monday. Take with a full glass of water on an empty stomach.  Marland Kitchen allopurinol (ZYLOPRIM) 100 MG tablet Take 1 tablet (100 mg total) by mouth daily.  Marland Kitchen aspirin EC 81 MG tablet Take 81 mg by mouth daily.  . brimonidine (ALPHAGAN P) 0.1 % SOLN Place 1 drop into both eyes 2 (two) times a day.  . calcium carbonate (OS-CAL) 1250 (500 Ca) MG chewable tablet Chew 1 tablet by mouth daily.  . cetirizine (ZYRTEC) 10 MG tablet Take 10 mg by mouth daily.  . cholecalciferol (VITAMIN D) 1000 UNITS tablet Take 1,000 Units by mouth daily.  . citalopram (CELEXA) 10 MG tablet Take 10 mg by mouth daily.   . clobetasol ointment (TEMOVATE) 5.64 % Apply 1 application topically 2 (two) times daily.   . colchicine (COLCRYS) 0.6 MG tablet Take 0.5 tablets (0.3 mg total) by mouth daily.  Marland Kitchen desonide (DESOWEN) 0.05 % cream Apply 1 application topically 2 (two) times daily.   Marland Kitchen dexlansoprazole (DEXILANT) 60 MG capsule Take 60 mg by mouth daily.   . diclofenac sodium (VOLTAREN) 1 % GEL Apply 2 g topically daily as needed (leg pain). Reported on 09/15/2015  . diltiazem (CARTIA XT) 300 MG 24 hr capsule Take 1 capsule (300 mg total) by mouth daily. Reported on 09/15/2015  . fluticasone (FLONASE) 50 MCG/ACT nasal spray Place 2 sprays into both nostrils daily.  . furosemide (LASIX) 80 MG tablet Take 0.5 tablets (40 mg  total) by mouth 2 (two) times daily.  Marland Kitchen gabapentin (NEURONTIN) 300 MG capsule Take 1 capsule (300 mg total) by mouth 2 (two) times daily. (Patient taking differently: Take 100 mg by mouth 3 (three) times daily. )  . hydrALAZINE (APRESOLINE) 50 MG tablet Take 50 mg in the morning and 100 mg at bedtime  . insulin aspart (NOVOLOG) 100 UNIT/ML injection Inject 10 Units into the skin 3 (three) times daily before meals.   . insulin glargine (LANTUS) 100 UNIT/ML injection Inject 0.4 mLs (40 Units total) into  the skin daily.  Marland Kitchen ipratropium-albuterol (DUONEB) 0.5-2.5 (3) MG/3ML SOLN Take 3 mLs by nebulization 3 (three) times daily. Use 3 times daily x4 days then every 6 hours as needed.  . isosorbide mononitrate (IMDUR) 30 MG 24 hr tablet Take 30 mg by mouth every morning.  Marland Kitchen ketotifen (ALAWAY) 0.025 % ophthalmic solution Place 1 drop into both eyes daily as needed (allergy symptoms).   Marland Kitchen lisinopril-hydrochlorothiazide (ZESTORETIC) 20-12.5 MG tablet Take 1 tablet by mouth daily.  Marland Kitchen loratadine (CLARITIN) 10 MG tablet Take 1 tablet (10 mg total) by mouth daily.  . metoprolol tartrate (LOPRESSOR) 50 MG tablet Take 1 tablet (50 mg total) by mouth 2 (two) times daily.  . OXYGEN Inhale 2 L into the lungs daily as needed (shortness of breath). 2L/min - prn during day and every evening.   . pravastatin (PRAVACHOL) 40 MG tablet Take 1 tablet (40 mg total) by mouth at bedtime.  . SYMBICORT 160-4.5 MCG/ACT inhaler Inhale 2 puffs into the lungs 2 (two) times daily. Reported on 09/15/2015  . tizanidine (ZANAFLEX) 2 MG capsule Take 2 mg by mouth 2 (two) times daily.  Marland Kitchen trimethoprim (TRIMPEX) 100 MG tablet Take 100 mg by mouth daily.  . vitamin B-12 (CYANOCOBALAMIN) 1000 MCG tablet Take 1,000 mcg by mouth daily. Reported on 09/15/2015     Allergies  Allergen Reactions  . Penicillins Swelling    Has patient had a PCN reaction causing immediate rash, facial/tongue/throat swelling, SOB or lightheadedness with hypotension: Yes Has patient had a PCN reaction causing severe rash involving mucus membranes or skin necrosis: Yes Has patient had a PCN reaction that required hospitalization Yes Has patient had a PCN reaction occurring within the last 10 years: No, more than 10 yrs ago If all of the above answers are "NO", then may proceed with Cephalosporin use.   . Fentanyl Itching  . Peach [Prunus Persica] Hives  . Shellfish Allergy Hives    Social History   Socioeconomic History  . Marital status: Single    Spouse  name: Not on file  . Number of children: 5  . Years of education: 76  . Highest education level: Not on file  Occupational History  . Occupation: Disabled  Tobacco Use  . Smoking status: Former Research scientist (life sciences)  . Smokeless tobacco: Never Used  . Tobacco comment: Quit 1980  Vaping Use  . Vaping Use: Never used  Substance and Sexual Activity  . Alcohol use: No    Alcohol/week: 0.0 standard drinks  . Drug use: No  . Sexual activity: Not on file  Other Topics Concern  . Not on file  Social History Narrative   Lives at home alone.   Right-handed.   2-4 cups caffeine daily.   Social Determinants of Health   Financial Resource Strain:   . Difficulty of Paying Living Expenses:   Food Insecurity:   . Worried About Charity fundraiser in the Last Year:   .  Ran Out of Food in the Last Year:   Transportation Needs:   . Film/video editor (Medical):   Marland Kitchen Lack of Transportation (Non-Medical):   Physical Activity:   . Days of Exercise per Week:   . Minutes of Exercise per Session:   Stress:   . Feeling of Stress :   Social Connections:   . Frequency of Communication with Friends and Family:   . Frequency of Social Gatherings with Friends and Family:   . Attends Religious Services:   . Active Member of Clubs or Organizations:   . Attends Archivist Meetings:   Marland Kitchen Marital Status:   Intimate Partner Violence:   . Fear of Current or Ex-Partner:   . Emotionally Abused:   Marland Kitchen Physically Abused:   . Sexually Abused:      Review of Systems: General: negative for chills, fever, night sweats or weight changes.  Cardiovascular: negative for chest pain, dyspnea on exertion, edema, orthopnea, palpitations, paroxysmal nocturnal dyspnea or shortness of breath Dermatological: negative for rash Respiratory: negative for cough or wheezing Urologic: negative for hematuria Abdominal: negative for nausea, vomiting, diarrhea, bright red blood per rectum, melena, or hematemesis Neurologic:  negative for visual changes, syncope, or dizziness All other systems reviewed and are otherwise negative except as noted above.    Blood pressure (!) 148/66, pulse 84, height 5\' 4"  (1.626 m), weight 211 lb (95.7 kg), SpO2 92 %.  General appearance: alert and no distress Neck: no adenopathy, no carotid bruit, no JVD, supple, symmetrical, trachea midline and thyroid not enlarged, symmetric, no tenderness/mass/nodules Lungs: clear to auscultation bilaterally Heart: regular rate and rhythm, S1, S2 normal, no murmur, click, rub or gallop Extremities: extremities normal, atraumatic, no cyanosis or edema Pulses: 2+ and symmetric Skin: Skin color, texture, turgor normal. No rashes or lesions Neurologic: Alert and oriented X 3, normal strength and tone. Normal symmetric reflexes. Normal coordination and gait  EKG not performed today  ASSESSMENT AND PLAN:   Hypertension associated with diabetes ([place]) History of essential hypertension blood pressure measured today 148/66.  She is on diltiazem, hydralazine, metoprolol, lisinopril and hydrochlorothiazide.  Hyperlipidemia associated with type 2 diabetes mellitus ([place]) History of hyperlipidemia on statin therapy followed by her PCP.  Chronic diastolic CHF (congestive heart failure) ([facility]) History of chronic diastolic heart failure on high-dose furosemide.      Lorretta Harp MD FACP,FACC,FAHA, [facility] 10/16/2019 2:39 PM

## 2019-10-16 NOTE — Patient Instructions (Signed)
Medication Instructions:  NO CHANGE *If you need a refill on your cardiac medications before your next appointment, please call your pharmacy*   Lab Work: If you have labs (blood work) drawn today and your tests are completely normal, you will receive your results only by: Marland Kitchen MyChart Message (if you have MyChart) OR . A paper copy in the mail If you have any lab test that is abnormal or we need to change your treatment, we will call you to review the results.   Follow-Up: At [facility], you and your health needs are our priority.  As part of our continuing mission to provide you with exceptional heart care, we have created designated Provider Care Teams.  These Care Teams include your primary Cardiologist (physician) and Advanced Practice Providers (APPs -  Physician Assistants and Nurse Practitioners) who all work together to provide you with the care you need, when you need it.  We recommend signing up for the patient portal called "MyChart".  Sign up information is provided on this After Visit Summary.  MyChart is used to connect with patients for Virtual Visits (Telemedicine).  Patients are able to view lab/test results, encounter notes, upcoming appointments, etc.  Non-urgent messages can be sent to your provider as well.   To learn more about what you can do with MyChart, go to [email or web address].    Your next appointment:    Your physician wants you to follow-up in: [place] will receive a reminder letter in the mail two months in advance. If you don't receive a letter, please call our office to schedule the follow-up appointment.   Your physician wants you to follow-up in: [place] will receive a reminder letter in the mail two months in advance. If you don't receive a letter, please call our office to schedule the follow-up appointment.

## 2019-10-16 NOTE — Assessment & Plan Note (Signed)
History of essential hypertension blood pressure measured today 148/66.  She is on diltiazem, hydralazine, metoprolol, lisinopril and hydrochlorothiazide.

## 2019-10-16 NOTE — Assessment & Plan Note (Signed)
History of hyperlipidemia on statin therapy followed by her PCP. 

## 2019-10-24 ENCOUNTER — Ambulatory Visit: Payer: Medicare HMO | Admitting: Specialist

## 2019-11-04 ENCOUNTER — Other Ambulatory Visit: Payer: Self-pay

## 2019-11-04 ENCOUNTER — Emergency Department (HOSPITAL_COMMUNITY)

## 2019-11-04 ENCOUNTER — Encounter (HOSPITAL_COMMUNITY): Payer: Self-pay

## 2019-11-04 ENCOUNTER — Observation Stay (HOSPITAL_COMMUNITY)
Admission: EM | Admit: 2019-11-04 | Discharge: 2019-11-05 | Disposition: A | Discharge status: Home / Self Care | Attending: Internal Medicine | Admitting: Internal Medicine

## 2019-11-04 DIAGNOSIS — I130 Hypertensive heart and chronic kidney disease with heart failure and stage 1 through stage 4 chronic kidney disease, or unspecified chronic kidney disease: Secondary | ICD-10-CM | POA: Insufficient documentation

## 2019-11-04 DIAGNOSIS — J810 Acute pulmonary edema: Principal | ICD-10-CM | POA: Insufficient documentation

## 2019-11-04 DIAGNOSIS — Z794 Long term (current) use of insulin: Secondary | ICD-10-CM | POA: Insufficient documentation

## 2019-11-04 DIAGNOSIS — J441 Chronic obstructive pulmonary disease with (acute) exacerbation: Secondary | ICD-10-CM | POA: Diagnosis not present

## 2019-11-04 DIAGNOSIS — Z853 Personal history of malignant neoplasm of breast: Secondary | ICD-10-CM | POA: Diagnosis not present

## 2019-11-04 DIAGNOSIS — Z79899 Other long term (current) drug therapy: Secondary | ICD-10-CM | POA: Insufficient documentation

## 2019-11-04 DIAGNOSIS — Z7982 Long term (current) use of aspirin: Secondary | ICD-10-CM | POA: Insufficient documentation

## 2019-11-04 DIAGNOSIS — E1122 Type 2 diabetes mellitus with diabetic chronic kidney disease: Secondary | ICD-10-CM | POA: Insufficient documentation

## 2019-11-04 DIAGNOSIS — N189 Chronic kidney disease, unspecified: Secondary | ICD-10-CM | POA: Insufficient documentation

## 2019-11-04 DIAGNOSIS — N179 Acute kidney failure, unspecified: Secondary | ICD-10-CM | POA: Diagnosis not present

## 2019-11-04 DIAGNOSIS — Z20822 Contact with and (suspected) exposure to covid-19: Secondary | ICD-10-CM | POA: Diagnosis not present

## 2019-11-04 DIAGNOSIS — I509 Heart failure, unspecified: Secondary | ICD-10-CM | POA: Insufficient documentation

## 2019-11-04 DIAGNOSIS — R0602 Shortness of breath: Secondary | ICD-10-CM | POA: Diagnosis present

## 2019-11-04 DIAGNOSIS — Z7951 Long term (current) use of inhaled steroids: Secondary | ICD-10-CM | POA: Insufficient documentation

## 2019-11-04 DIAGNOSIS — Z87891 Personal history of nicotine dependence: Secondary | ICD-10-CM | POA: Diagnosis not present

## 2019-11-04 LAB — CBC
HCT: 29.3 % — ABNORMAL LOW (ref 36.0–46.0)
Hemoglobin: 9 g/dL — ABNORMAL LOW (ref 12.0–15.0)
MCH: 28 pg (ref 26.0–34.0)
MCHC: 30.7 g/dL (ref 30.0–36.0)
MCV: 91.3 fL (ref 80.0–100.0)
Platelets: 268 10*3/uL (ref 150–400)
RBC: 3.21 MIL/uL — ABNORMAL LOW (ref 3.87–5.11)
RDW: 18.6 % — ABNORMAL HIGH (ref 11.5–15.5)
WBC: 7.7 10*3/uL (ref 4.0–10.5)
nRBC: 0 % (ref 0.0–0.2)

## 2019-11-04 LAB — BASIC METABOLIC PANEL
Anion gap: 12 (ref 5–15)
BUN: 60 mg/dL — ABNORMAL HIGH (ref 8–23)
CO2: 25 mmol/L (ref 22–32)
Calcium: 9.8 mg/dL (ref 8.9–10.3)
Chloride: 107 mmol/L (ref 98–111)
Creatinine, Ser: 2.32 mg/dL — ABNORMAL HIGH (ref 0.44–1.00)
GFR calc Af Amer: 23 mL/min — ABNORMAL LOW (ref >60)
GFR calc non Af Amer: 19 mL/min — ABNORMAL LOW (ref >60)
Glucose, Bld: 141 mg/dL — ABNORMAL HIGH (ref 70–99)
Potassium: 5.9 mmol/L — ABNORMAL HIGH (ref 3.5–5.1)
Sodium: 144 mmol/L (ref 135–145)

## 2019-11-04 LAB — BRAIN NATRIURETIC PEPTIDE: B Natriuretic Peptide: 211.7 pg/mL — ABNORMAL HIGH (ref 0.0–100.0)

## 2019-11-04 LAB — TROPONIN I (HIGH SENSITIVITY)
Troponin I (High Sensitivity): 18 ng/L — ABNORMAL HIGH
Troponin I (High Sensitivity): 16 ng/L (ref <18)

## 2019-11-04 LAB — SARS CORONAVIRUS 2 BY RT PCR (HOSPITAL ORDER, PERFORMED IN ~~LOC~~ HOSPITAL LAB): SARS Coronavirus 2: NEGATIVE

## 2019-11-04 MED ORDER — FUROSEMIDE 10 MG/ML IJ SOLN
40.0000 mg | Freq: Once | INTRAMUSCULAR | Status: AC
  Administered 2019-11-04: 40 mg via INTRAVENOUS
  Filled 2019-11-04: qty 4

## 2019-11-04 NOTE — ED Triage Notes (Signed)
Patient arrived via [facility] from home.   Hospice nurse came in to evaluate pt and states she has gained 15lbs since the weekend.   Hospice nurse states patient gained circumference of forearms calf's.  Pt usually on 6 liter of 02 at home 2 100%  Patient denies shob, chest pain, and has no complaints.   No DNR on file per ems.   A/OX4

## 2019-11-04 NOTE — ED Provider Notes (Addendum)
[facility] DEPT Provider Note   CSN: [id number] Arrival date & time: 11/04/19  1659     History Chief Complaint  Patient presents with  . Arm Swelling    HOSPICE PT    [name] [name] is a 79 y.o. female.  HPI  Patient is a 79 year old female with a history of CHF, COPD, asthma, obese, breast cancer multiple myeloma, history of pulmonary embolism many years ago now on aspirin, HTN, HLD.   Patient is 79 year old female presented today with shortness of breath.  She states that this came on over the weekend.  She also has an associated 15 pound weight gain per her hospice nurse who told her there this morning.  She has noted some swelling in her legs and arms as well.  She denies any chest pain but has endorsed some chest tightness.  She states that this occurs intermittently for her maybe once a month.  She denies any chest heaviness, denies any radiating chest pain.  She denies any new or worsening orthopnea or PND.  She does state that she feels more short of breath at night.  She is still making urine.  She denies any fevers but does endorse occasional cough.  Denies any nausea, vomiting, fevers or chills.  Patient is followed by Dr. Vicente Males of cardiology.      Past Medical History:  Diagnosis Date  . Allergic rhinitis   . Anxiety   . Arthritis   . Asthma   . Cancer [facility]) 2006   breast cancer right  . CHF (congestive heart failure) ([place])   . COPD (chronic obstructive pulmonary disease) ([place])   . Degenerative disc disease, lumbar   . Diabetes mellitus without complication ([place])   . Dyspnea    walking distances  . Eczema   . GERD (gastroesophageal reflux disease)   . Glaucoma   . History of home oxygen therapy    uses 2 liters ay night and prn  . Hyperlipidemia   . Hypertension   . Low back pain   . Lupus ([place])    skin  . Neck pain   . Numbness and tingling   . Obesity   . Osteopenia   . Pulmonary embolism ([place]) 01/2012    CT showed  multi small PE and coumadized   . Sleep apnea 2010   no cpap used  . Systemic lupus erythematosus ([place])     Patient Active Problem List   Diagnosis Date Noted  . Essential hypertension   . Hyperlipidemia   . Chronic kidney disease, stage 3b   . Fall 05/21/2019  . COPD exacerbation ([place]) 05/21/2019  . Pressure injury of skin 02/21/2019  . Acute respiratory disease due to COVID-19 virus 02/16/2019  . Acute on chronic respiratory failure with hypoxia ([place]) 02/15/2019  . Acute-on-chronic kidney injury ([place]) 02/15/2019  . Sepsis ([place]) 02/15/2019  . Obesity (BMI 30-39.9) 10/31/2018  . CRI (chronic renal insufficiency), stage 3 (moderate) 10/31/2018  . Iron deficiency anemia 04/10/2018  . 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 Abnormality of gait 08/05/2015  . Spinal stenosis of lumbar region 08/05/2015  . Peripheral neuropathy 08/05/2015  . Chest pain 12/28/2013  . Hyperlipidemia associated with type 2 diabetes mellitus ([place]) 04/05/2013  . Systemic lupus erythematosus ([place]) 04/05/2013  . Pulmonary embolism ([place]) 01/24/2012  . Chronic diastolic CHF (congestive heart failure) ([place]) 01/24/2012  . COPD (chronic obstructive pulmonary disease) ([place]) 01/24/2012  . Diabetes mellitus ([place]) 01/24/2012  . Hypertension associated with  diabetes ([place]) 01/24/2012  . Leukocytosis 01/24/2012  . Bronchitis 01/24/2012  . Anemia 01/24/2012    Past Surgical History:  Procedure Laterality Date  . ABDOMINAL HYSTERECTOMY  1976  . BACK SURGERY  2006   lower  . BREAST BIOPSY Right   . BREAST EXCISIONAL BIOPSY Left   . BREAST LUMPECTOMY Right   . COLONOSCOPY WITH PROPOFOL N/A 03/23/2015   Procedure: COLONOSCOPY WITH PROPOFOL;  Surgeon: Laurence Spates, MD;  Location: [facility] ENDOSCOPY;  Service: Endoscopy;  Laterality: N/A;  . Dobtamine myoview  05/02/2008   EF 67% ; LV norm  . DOPPLER ECHOCARDIOGRAPHY  01/25/2012   EF 55 TO 60%; LV norm.  . EXPLORATORY LAPAROTOMY    . EYE SURGERY Bilateral 2010    lens reaplcments for cataracts   . Lower Extrem. venous doppler  01/25/2012    neg.  . TOE SURGERY  1996   Bunion     OB History   No obstetric history on file.     Family History  Problem Relation Age of Onset  . Heart failure Father   . Heart failure Mother   . Diabetes Brother   . Breast cancer Maternal Aunt   . Breast cancer Paternal Aunt     Social History   Tobacco Use  . Smoking status: Former Research scientist (life sciences)  . Smokeless tobacco: Never Used  . Tobacco comment: Quit 1980  Vaping Use  . Vaping Use: Never used  Substance Use Topics  . Alcohol use: No    Alcohol/week: 0.0 standard drinks  . Drug use: No    Home Medications Prior to Admission medications   Medication Sig Start Date End Date Taking? Authorizing Provider  acetaminophen (TYLENOL) 500 MG tablet Take 500 mg by mouth every 6 (six) hours as needed for mild pain or moderate pain.    [provider]  albuterol (PROVENTIL HFA;VENTOLIN HFA) 108 (90 BASE) MCG/ACT inhaler Inhale 2 puffs into the lungs See admin instructions. Every 6 hours as needed for SOB but also takes 2 puffs BID scheduled.    [provider]  alendronate (FOSAMAX) 70 MG tablet Take 70 mg by mouth every Monday. Take with a full glass of water on an empty stomach.    [provider]  allopurinol (ZYLOPRIM) 100 MG tablet Take 1 tablet (100 mg total) by mouth daily. 09/09/19   Jessy Oto, MD  aspirin EC 81 MG tablet Take 81 mg by mouth daily.    [provider]  brimonidine (ALPHAGAN P) 0.1 % SOLN Place 1 drop into both eyes 2 (two) times a day.    [provider]  calcium carbonate (OS-CAL) 1250 (500 Ca) MG chewable tablet Chew 1 tablet by mouth daily.    [provider]  cetirizine (ZYRTEC) 10 MG tablet Take 10 mg by mouth daily.    [provider]  cholecalciferol (VITAMIN D) 1000 UNITS tablet Take 1,000 Units by mouth daily.    [provider]  citalopram (CELEXA) 10 MG  tablet Take 10 mg by mouth daily.     [provider]  clobetasol ointment (TEMOVATE) 7.12 % Apply 1 application topically 2 (two) times daily.     [provider]  colchicine (COLCRYS) 0.6 MG tablet Take 0.5 tablets (0.3 mg total) by mouth daily. 02/21/19 10/16/19  Arrien, Jimmy Picket, MD  desonide (DESOWEN) 0.05 % cream Apply 1 application topically 2 (two) times daily.     [provider]  dexlansoprazole (DEXILANT) 60 MG capsule Take  60 mg by mouth daily.     [provider]  diclofenac sodium (VOLTAREN) 1 % GEL Apply 2 g topically daily as needed (leg pain). Reported on 09/15/2015    [provider]  diltiazem (CARTIA XT) 300 MG 24 hr capsule Take 1 capsule (300 mg total) by mouth daily. Reported on 09/15/2015 08/06/19   Lendon Colonel, NP  fluticasone [facility]) 50 MCG/ACT nasal spray Place 2 sprays into both nostrils daily. 05/24/19   Eugenie Filler, MD  furosemide (LASIX) 80 MG tablet Take 0.5 tablets (40 mg total) by mouth 2 (two) times daily. 08/06/19   Lendon Colonel, NP  gabapentin (NEURONTIN) 300 MG capsule Take 1 capsule (300 mg total) by mouth 2 (two) times daily. Patient taking differently: Take 100 mg by mouth 3 (three) times daily.  04/05/18   Jessy Oto, MD  hydrALAZINE (APRESOLINE) 50 MG tablet Take 50 mg in the morning and 100 mg at bedtime 08/06/19   Lendon Colonel, NP  insulin aspart (NOVOLOG) 100 UNIT/ML injection Inject 10 Units into the skin 3 (three) times daily before meals.     [provider]  insulin glargine (LANTUS) 100 UNIT/ML injection Inject 0.4 mLs (40 Units total) into the skin daily. 05/23/19 10/16/19  Eugenie Filler, MD  ipratropium-albuterol (DUONEB) 0.5-2.5 (3) MG/3ML SOLN Take 3 mLs by nebulization 3 (three) times daily. Use 3 times daily x4 days then every 6 hours as needed. 05/23/19   Eugenie Filler, MD  isosorbide mononitrate (IMDUR) 30 MG 24 hr tablet Take 30 mg by mouth every  morning.    [provider]  ketotifen (ALAWAY) 0.025 % ophthalmic solution Place 1 drop into both eyes daily as needed (allergy symptoms).     [provider]  lisinopril-hydrochlorothiazide (ZESTORETIC) 20-12.5 MG tablet Take 1 tablet by mouth daily. 08/06/19   Lendon Colonel, NP  loratadine (CLARITIN) 10 MG tablet Take 1 tablet (10 mg total) by mouth daily. 05/24/19   Eugenie Filler, MD  metoprolol tartrate (LOPRESSOR) 50 MG tablet Take 1 tablet (50 mg total) by mouth 2 (two) times daily. 08/06/19   Lendon Colonel, NP  OXYGEN Inhale 2 L into the lungs daily as needed (shortness of breath). 2L/min - prn during day and every evening.     [provider]  pravastatin (PRAVACHOL) 40 MG tablet Take 1 tablet (40 mg total) by mouth at bedtime. 08/06/19   Lendon Colonel, NP  SYMBICORT 160-4.5 MCG/ACT inhaler Inhale 2 puffs into the lungs 2 (two) times daily. Reported on 09/15/2015 04/22/13   [provider]  tizanidine (ZANAFLEX) 2 MG capsule Take 2 mg by mouth 2 (two) times daily.    [provider]  trimethoprim (TRIMPEX) 100 MG tablet Take 100 mg by mouth daily. 11/23/18   [provider]  vitamin B-12 (CYANOCOBALAMIN) 1000 MCG tablet Take 1,000 mcg by mouth daily. Reported on 09/15/2015    [provider]      Allergies    Penicillins, Fentanyl, Peach [prunus persica], and Shellfish allergy  Review of Systems   Review of Systems  Constitutional: Positive for fatigue. Negative for chills and fever.  HENT: Negative for congestion.   Eyes: Negative for pain.  Respiratory: Positive for shortness of breath. Negative for cough.   Cardiovascular: Negative for chest pain and leg swelling.  Gastrointestinal: Negative for abdominal pain and vomiting.  Genitourinary: Negative for dysuria.  Musculoskeletal: Negative for myalgias.  Leg swelling  Skin: Negative for rash.  Neurological: Negative for dizziness and headaches.     Physical Exam Updated Vital Signs BP 114/81 (BP Location: Right Arm)   Pulse 70   Temp 98.8 F (37.1 C) (Oral)   Resp 20   SpO2 100%   Physical Exam Vitals and nursing note reviewed.  Constitutional:      General: She is not in acute distress.    Appearance: She is obese.  HENT:     Head: Normocephalic and atraumatic.     Nose: Nose normal.     Mouth/Throat:     Mouth: Mucous membranes are moist.  Eyes:     General: No scleral icterus.    Extraocular Movements: Extraocular movements intact.     Pupils: Pupils are equal, round, and reactive to light.  Neck:     Comments: No significant discernible JVD Cardiovascular:     Rate and Rhythm: Normal rate and regular rhythm.     Pulses: Normal pulses.     Heart sounds: Normal heart sounds.  Pulmonary:     Effort: Pulmonary effort is normal. No respiratory distress.     Breath sounds: Rales present. No wheezing.     Comments: Crackles auscultated to the level of the scapula bilaterally. Abdominal:     Palpations: Abdomen is soft.     Tenderness: There is no abdominal tenderness. There is no guarding or rebound.  Musculoskeletal:     Cervical back: Normal range of motion.     Right lower leg: Edema present.     Left lower leg: Edema present.     Comments: Trace bilateral foot and ankle edema  Skin:    General: Skin is warm and dry.     Capillary Refill: Capillary refill takes less than 2 seconds.  Neurological:     Mental Status: She is alert. Mental status is at baseline.  Psychiatric:        Mood and Affect: Mood normal.        Behavior: Behavior normal.     ED Results / Procedures / Treatments   Labs (all labs ordered are listed, but only abnormal results are displayed) Labs Reviewed  CBC - Abnormal; Notable for the following components:      Result Value   RBC 3.21 (*)    Hemoglobin 9.0 (*)    HCT 29.3 (*)    RDW 18.6 (*)    All other components within normal limits  BASIC METABOLIC PANEL - Abnormal;  Notable for the following components:   Potassium 5.9 (*)    Glucose, Bld 141 (*)    BUN 60 (*)    Creatinine, Ser 2.32 (*)    GFR calc non Af Amer 19 (*)    GFR calc Af Amer 23 (*)    All other components within normal limits  BRAIN NATRIURETIC PEPTIDE - Abnormal; Notable for the following components:   B Natriuretic Peptide 211.7 (*)    All other components within normal limits  TROPONIN I (HIGH SENSITIVITY) - Abnormal; Notable for the following components:   Troponin I (High Sensitivity) 18 (*)    All other components within normal limits  TROPONIN I (HIGH SENSITIVITY)    EKG EKG Interpretation  Date/Time:  Monday November 04 2019 17:46:39 EDT Ventricular Rate:  69 PR Interval:    QRS Duration: 143 QT Interval:  428 QTC Calculation: 459 R Axis:   -55 Text Interpretation: Sinus rhythm RBBB and LAFB Confirmed by Dewaine Conger [phone])  on 11/04/2019 6:17:22 PM   Radiology DG Chest 2 View  Result Date: 11/04/2019 CLINICAL DATA:  Wheezing EXAM: CHEST - 2 VIEW COMPARISON:  05/21/2019, 02/15/2019 FINDINGS: No pleural effusion or focal consolidation. Mild cardiomegaly with vascular congestion. Mild diffuse interstitial and hazy pulmonary opacity. Aortic atherosclerosis. No pneumothorax. IMPRESSION: Cardiomegaly with vascular congestion and mild diffuse interstitial and hazy pulmonary opacity, suspect for mild pulmonary edema. Electronically Signed   By: Donavan Foil M.D.   On: 11/04/2019 19:07    Procedures Procedures (including critical care time)  Medications Ordered in ED Medications  furosemide (LASIX) injection 40 mg (40 mg Intravenous Given 11/04/19 1855)    ED Course  I have reviewed the triage vital signs and the nursing notes.  Pertinent labs & imaging results that were available during my care of the patient were reviewed by me and considered in my medical decision making (see chart for details).  79 year old female with past medical history detailed above.  She is  presenting today for shortness of breath.  She has lower extremity swelling and crackles on exam.  Chest x-ray evidence of diffuse pulmonary edema consistent with my examination.  Clinical Course as of Nov 03 2041  Mon Nov 04, 2019  1947 Potassium 5.9.  There is note that there was some slight hemolysis of the sample.  Patient has no peak T waves.  BUN and creatinine with moderate to significant elevation today.  Appears the patient's baseline is approximately 1.3-1.6.   [WF]  1948 CBC without leukocytosis chronic anemia present.  No acute changes.   [WF]  1948 BNP 211 --I suspect that this is not elevated significantly due to the patient's body habitus which is severely obese.  Patient has other signs of fluid overload including pulmonary edema, crackles in the lungs.  She does not have significant distal edema in her legs.  On my review of EMR appears the patient has not had echocardiogram done since 2017.  Brain natriuretic peptide(!) [WF]  2010 Patient is urine output has been approximately 600 mL.   [WF]    Clinical Course User Index [WF] Tedd Sias, [place]   Discussed with hospitalist who will the patient to hospital for follow-up on kidney function and will likely touch base with cardiology.  I discussed this case with my attending physician who cosigned this note including patient's presenting symptoms, physical exam, and planned diagnostics and interventions. Attending physician stated agreement with plan or made changes to plan which were implemented.   Attending physician assessed patient at bedside.   MDM Rules/Calculators/A&P                          Patient is agreeable to plan to admit to the hospital.  Discussed with hospitalist who will adsmit.   11:50 PM Reconsulted hospitalist to ensure patient is admitted/seen. Patient well appearing.   Final Clinical Impression(s) / ED Diagnoses Final diagnoses:  Congestive heart failure, unspecified HF chronicity,  unspecified heart failure type ([place])  Acute pulmonary edema (HCC)  AKI (acute kidney injury) ([place])  Chronic kidney disease, unspecified CKD stage    Rx / DC Orders ED Discharge Orders    None       Tedd Sias, [place] 11/04/19 2045    Breck Coons, MD 11/04/19 2224    Tedd Sias, PA 11/04/19 2351    Breck Coons, MD 11/05/19 1459

## 2019-11-05 DIAGNOSIS — J810 Acute pulmonary edema: Secondary | ICD-10-CM

## 2019-11-05 DIAGNOSIS — I509 Heart failure, unspecified: Secondary | ICD-10-CM

## 2019-11-05 DIAGNOSIS — I5031 Acute diastolic (congestive) heart failure: Secondary | ICD-10-CM

## 2019-11-05 DIAGNOSIS — N179 Acute kidney failure, unspecified: Secondary | ICD-10-CM | POA: Diagnosis not present

## 2019-11-05 LAB — CBC
HCT: 31.3 % — ABNORMAL LOW (ref 36.0–46.0)
HCT: 30.4 % — ABNORMAL LOW (ref 36.0–46.0)
Hemoglobin: 9.4 g/dL — ABNORMAL LOW (ref 12.0–15.0)
Hemoglobin: 9 g/dL — ABNORMAL LOW (ref 12.0–15.0)
MCH: 27.7 pg (ref 26.0–34.0)
MCH: 27.1 pg (ref 26.0–34.0)
MCHC: 30 g/dL (ref 30.0–36.0)
MCHC: 29.6 g/dL — ABNORMAL LOW (ref 30.0–36.0)
MCV: 92.3 fL (ref 80.0–100.0)
MCV: 91.6 fL (ref 80.0–100.0)
Platelets: 348 10*3/uL (ref 150–400)
Platelets: 342 10*3/uL (ref 150–400)
RBC: 3.39 MIL/uL — ABNORMAL LOW (ref 3.87–5.11)
RBC: 3.32 MIL/uL — ABNORMAL LOW (ref 3.87–5.11)
RDW: 18.5 % — ABNORMAL HIGH (ref 11.5–15.5)
RDW: 18.5 % — ABNORMAL HIGH (ref 11.5–15.5)
WBC: 8.5 10*3/uL (ref 4.0–10.5)
WBC: 8.8 10*3/uL (ref 4.0–10.5)
nRBC: 0 % (ref 0.0–0.2)
nRBC: 0 % (ref 0.0–0.2)

## 2019-11-05 LAB — COMPREHENSIVE METABOLIC PANEL
ALT: 16 U/L (ref 0–44)
AST: 21 U/L (ref 15–41)
Albumin: 3.4 g/dL — ABNORMAL LOW (ref 3.5–5.0)
Alkaline Phosphatase: 77 U/L (ref 38–126)
Anion gap: 8 (ref 5–15)
BUN: 58 mg/dL — ABNORMAL HIGH (ref 8–23)
CO2: 31 mmol/L (ref 22–32)
Calcium: 9.9 mg/dL (ref 8.9–10.3)
Chloride: 103 mmol/L (ref 98–111)
Creatinine, Ser: 1.99 mg/dL — ABNORMAL HIGH (ref 0.44–1.00)
GFR calc Af Amer: 27 mL/min — ABNORMAL LOW (ref >60)
GFR calc non Af Amer: 23 mL/min — ABNORMAL LOW (ref >60)
Glucose, Bld: 175 mg/dL — ABNORMAL HIGH (ref 70–99)
Potassium: 4.5 mmol/L (ref 3.5–5.1)
Sodium: 142 mmol/L (ref 135–145)
Total Bilirubin: 0.2 mg/dL — ABNORMAL LOW (ref 0.3–1.2)
Total Protein: 7.3 g/dL (ref 6.5–8.1)

## 2019-11-05 LAB — CREATININE, SERUM
Creatinine, Ser: 1.97 mg/dL — ABNORMAL HIGH (ref 0.44–1.00)
GFR calc Af Amer: 28 mL/min — ABNORMAL LOW (ref >60)
GFR calc non Af Amer: 24 mL/min — ABNORMAL LOW (ref >60)

## 2019-11-05 LAB — HEMOGLOBIN A1C
Hgb A1c MFr Bld: 7 % — ABNORMAL HIGH (ref 4.8–5.6)
Mean Plasma Glucose: 154.2 mg/dL

## 2019-11-05 LAB — CBG MONITORING, ED
Glucose-Capillary: 115 mg/dL — ABNORMAL HIGH (ref 70–99)
Glucose-Capillary: 251 mg/dL — ABNORMAL HIGH (ref 70–99)

## 2019-11-05 MED ORDER — ACETAMINOPHEN 500 MG PO TABS: 500.0000 mg | ORAL_TABLET | Freq: Four times a day (QID) | ORAL | Status: DC | PRN

## 2019-11-05 MED ORDER — ONDANSETRON HCL 4 MG PO TABS: 4.0000 mg | ORAL_TABLET | Freq: Four times a day (QID) | ORAL | Status: DC | PRN

## 2019-11-05 MED ORDER — MOMETASONE FURO-FORMOTEROL FUM 200-5 MCG/ACT IN AERO
2.0000 | INHALATION_SPRAY | Freq: Two times a day (BID) | RESPIRATORY_TRACT | Status: DC
  Administered 2019-11-05: 2 via RESPIRATORY_TRACT
  Filled 2019-11-05: qty 8.8

## 2019-11-05 MED ORDER — HYDRALAZINE HCL 25 MG PO TABS: 50.0000 mg | ORAL_TABLET | ORAL | Status: DC

## 2019-11-05 MED ORDER — ALLOPURINOL 100 MG PO TABS
100.0000 mg | ORAL_TABLET | Freq: Two times a day (BID) | ORAL | Status: DC
  Administered 2019-11-05: 100 mg via ORAL
  Filled 2019-11-05: qty 1

## 2019-11-05 MED ORDER — SODIUM CHLORIDE 0.9% FLUSH
3.0000 mL | Freq: Two times a day (BID) | INTRAVENOUS | Status: DC
  Administered 2019-11-05 (×2): 3 mL via INTRAVENOUS

## 2019-11-05 MED ORDER — TRAMADOL HCL 50 MG PO TABS
100.0000 mg | ORAL_TABLET | Freq: Two times a day (BID) | ORAL | Status: DC | PRN
  Administered 2019-11-05: 100 mg via ORAL
  Filled 2019-11-05: qty 2

## 2019-11-05 MED ORDER — INSULIN GLARGINE 100 UNIT/ML ~~LOC~~ SOLN
40.0000 [IU] | Freq: Every day | SUBCUTANEOUS | Status: DC
  Administered 2019-11-05: 40 [IU] via SUBCUTANEOUS
  Filled 2019-11-05: qty 0.4

## 2019-11-05 MED ORDER — ENOXAPARIN SODIUM 30 MG/0.3ML ~~LOC~~ SOLN
30.0000 mg | Freq: Every day | SUBCUTANEOUS | Status: DC
  Administered 2019-11-05: 30 mg via SUBCUTANEOUS
  Filled 2019-11-05: qty 0.3

## 2019-11-05 MED ORDER — SODIUM CHLORIDE 0.9% FLUSH: 3.0000 mL | INTRAVENOUS | Status: DC | PRN

## 2019-11-05 MED ORDER — CITALOPRAM HYDROBROMIDE 10 MG PO TABS
10.0000 mg | ORAL_TABLET | Freq: Every day | ORAL | Status: DC
  Administered 2019-11-05: 10 mg via ORAL
  Filled 2019-11-05: qty 1

## 2019-11-05 MED ORDER — SODIUM CHLORIDE 0.9 % IV SOLN: 250.0000 mL | INTRAVENOUS | Status: DC | PRN

## 2019-11-05 MED ORDER — IPRATROPIUM-ALBUTEROL 0.5-2.5 (3) MG/3ML IN SOLN: 3.0000 mL | Freq: Four times a day (QID) | RESPIRATORY_TRACT | Status: DC | PRN

## 2019-11-05 MED ORDER — FUROSEMIDE 10 MG/ML IJ SOLN: 40.0000 mg | Freq: Two times a day (BID) | INTRAMUSCULAR | Status: DC

## 2019-11-05 MED ORDER — INSULIN ASPART 100 UNIT/ML ~~LOC~~ SOLN
0.0000 [IU] | Freq: Three times a day (TID) | SUBCUTANEOUS | Status: DC
  Administered 2019-11-05: 5 [IU] via SUBCUTANEOUS
  Filled 2019-11-05: qty 0.09

## 2019-11-05 MED ORDER — GABAPENTIN 300 MG PO CAPS
300.0000 mg | ORAL_CAPSULE | Freq: Two times a day (BID) | ORAL | Status: DC
  Administered 2019-11-05 (×2): 300 mg via ORAL
  Filled 2019-11-05 (×2): qty 1

## 2019-11-05 MED ORDER — HYDROXYCHLOROQUINE SULFATE 200 MG PO TABS
200.0000 mg | ORAL_TABLET | Freq: Two times a day (BID) | ORAL | Status: DC
  Administered 2019-11-05: 200 mg via ORAL
  Filled 2019-11-05: qty 1

## 2019-11-05 MED ORDER — ALBUTEROL SULFATE HFA 108 (90 BASE) MCG/ACT IN AERS: 2.0000 | INHALATION_SPRAY | RESPIRATORY_TRACT | Status: DC

## 2019-11-05 MED ORDER — ASPIRIN EC 81 MG PO TBEC
81.0000 mg | DELAYED_RELEASE_TABLET | Freq: Every day | ORAL | Status: DC
  Administered 2019-11-05: 81 mg via ORAL
  Filled 2019-11-05: qty 1

## 2019-11-05 MED ORDER — METOPROLOL TARTRATE 25 MG PO TABS
50.0000 mg | ORAL_TABLET | Freq: Two times a day (BID) | ORAL | Status: DC
  Administered 2019-11-05 (×2): 50 mg via ORAL
  Filled 2019-11-05 (×2): qty 2

## 2019-11-05 MED ORDER — DILTIAZEM HCL ER COATED BEADS 180 MG PO CP24
300.0000 mg | ORAL_CAPSULE | Freq: Every day | ORAL | Status: DC
  Administered 2019-11-05: 300 mg via ORAL
  Filled 2019-11-05: qty 1

## 2019-11-05 MED ORDER — ONDANSETRON HCL 4 MG/2ML IJ SOLN: 4.0000 mg | Freq: Four times a day (QID) | INTRAMUSCULAR | Status: DC | PRN

## 2019-11-05 NOTE — TOC Initial Note (Addendum)
Transition of Care [facility]) - Initial/Assessment Note    Patient Details  Name: [name] [name] MRN: [id number] Date of Birth: Mar 13, 1941  [facility] CM/SW Contact:    Erenest Rasher, RN Phone Number: [phone] 11/05/2019, 1:31 PM  Clinical Narrative:                 TOC CM spoke to pt and states she is active with [facility]. Nira Conn, RN came out yesterday and set her to hospital due to the fluid. States she has a caregiver, Anderson Malta # [phone] from [facility], from 9-6 pm. States she has oxygen, wheelchair, bedside commode and RW at home. States her children live out of town.  Spoke to [employer] to discuss CHF protocol. States she is not aware of CHF protocol that will allow them to give her IV Lasix in the home. Explained pt did inquire about Purewick at home. Contacted pt's attending for orders for HHRN/CHF protocol and Purewick.   Anderson Malta plans to pick pt up, pt states she does not have key to her home. Anderson Malta has a key. Faxed orders to [facility] for [facility] and purewick.     Expected Discharge Plan: Home w Hospice Care Barriers to Discharge: No Barriers Identified   Patient Goals and CMS Choice        Expected Discharge Plan and Services Expected Discharge Plan: Home w Hospice Care In-house Referral: Clinical Social Work Discharge Planning Services: CM Consult Post Acute Care Choice: Hospice Living arrangements for the past 2 months: Apartment Expected Discharge Date: 11/05/19                         HH Arranged: RN [facility] Agency: [place] Date [place]: 11/05/19   Representative spoke with at [place]: Nira Conn  Prior Living Arrangements/Services Living arrangements for the past 2 months: [place] with:: Self Patient language and need for interpreter reviewed:: Yes Do you feel safe going back to the place where you live?: Yes      Need for Family  Participation in Patient Care: Yes (Comment) Care giver support system in place?: Yes (comment) Current home services: DME (oxygen, wheelchair, bedside commode, rolling walker) Criminal Activity/Legal Involvement Pertinent to Current Situation/Hospitalization: No - Comment as needed  Activities of Daily Living Home Assistive Devices/Equipment: Bedside commode/3-in-1, Blood pressure cuff, Cane (specify quad or straight), CBG Meter, Eyeglasses, Hospital bed, Nebulizer, Oxygen, Reacher, Shower chair without back, Environmental consultant (specify type), Wheelchair, Other (Comment) (pulse oximeter, front wheeled and 4 wheeled walker) ADL Screening (condition at time of admission) Patient's cognitive ability adequate to safely complete daily activities?: Yes Is the patient deaf or have difficulty hearing?: No Does the patient have difficulty seeing, even when wearing glasses/contacts?: Yes Does the patient have difficulty concentrating, remembering, or making decisions?: No Patient able to express need for assistance with ADLs?: Yes Does the patient have difficulty dressing or bathing?: Yes Independently performs ADLs?: No Communication: Independent Dressing (OT): Needs assistance Is this a change from baseline?: Pre-admission baseline Grooming: Needs assistance Is this a change from baseline?: Pre-admission baseline Feeding: Needs assistance Is this a change from baseline?: Pre-admission baseline Bathing: Needs assistance Is this a change from baseline?: Pre-admission baseline Toileting: Needs assistance Is this a change from baseline?: Pre-admission baseline In/Out Bed: Needs assistance Is this a change from baseline?: Pre-admission baseline Walks in Home: Needs assistance Is this a change from baseline?:  Pre-admission baseline Does the patient have difficulty walking or climbing stairs?: Yes Weakness of Legs: Both Weakness of Arms/Hands: None  Permission Sought/Granted Permission sought to share  information with : Case Manager, PCP, Family Supports Permission granted to share information with : Yes, Verbal Permission Granted  Share Information with NAME: Anderson Malta  Permission granted to share info w AGENCY: [place] granted to share info w Relationship: caregiver  Permission granted to share info w Contact Information: [phone]  Emotional Assessment Appearance:: Appears stated age Attitude/Demeanor/Rapport: Engaged Affect (typically observed): Accepting Orientation: : Oriented to Self, Oriented to Place, Oriented to  Time, Oriented to Situation   Psych Involvement: No (comment)  Admission diagnosis:  CHF (congestive heart failure) ([place]) [I50.9] Patient Active Problem List   Diagnosis Date Noted  . CHF (congestive heart failure) ([place]) 11/05/2019  . Essential hypertension   . Hyperlipidemia   . Chronic kidney disease, stage 3b   . Fall 05/21/2019  . COPD exacerbation ([place]) 05/21/2019  . Pressure injury of skin 02/21/2019  . Acute respiratory disease due to COVID-19 virus 02/16/2019  . Acute on chronic respiratory failure with hypoxia ([place]) 02/15/2019  . Acute-on-chronic kidney injury ([place]) 02/15/2019  . Sepsis ([place]) 02/15/2019  . Obesity (BMI 30-39.9) 10/31/2018  . CRI (chronic renal insufficiency), stage 3 (moderate) 10/31/2018  . Iron deficiency anemia 04/10/2018  . 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 Abnormality of gait 08/05/2015  . Spinal stenosis of lumbar region 08/05/2015  . Peripheral neuropathy 08/05/2015  . Chest pain 12/28/2013  . Hyperlipidemia associated with type 2 diabetes mellitus ([place]) 04/05/2013  . Systemic lupus erythematosus ([place]) 04/05/2013  . Pulmonary embolism ([place]) 01/24/2012  . Chronic diastolic CHF (congestive heart failure) ([place]) 01/24/2012  . COPD (chronic obstructive pulmonary disease) ([place]) 01/24/2012  . Diabetes mellitus ([place]) 01/24/2012  . Hypertension associated with diabetes ([place]) 01/24/2012  .  Leukocytosis 01/24/2012  . Bronchitis 01/24/2012  . Anemia 01/24/2012   PCP:  Nolene Ebbs, MD Pharmacy:   [facility] [place] [address] Phone: [phone] Fax: [phone]     Social Determinants of Health (SDOH) Interventions    Readmission Risk Interventions No flowsheet data found.

## 2019-11-05 NOTE — H&P (Signed)
TRH H&P    Patient Demographics:    [name] [name], is a 79 y.o. female  MRN: [id number]  DOB - July 31, 1940  Admit Date - 11/04/2019  Referring MD/NP/PA: Pati Gallo  Outpatient Primary MD for the patient is Nolene Ebbs, MD  Patient coming from: Home  Chief complaint-shortness of breath   HPI:    [name] [name]  is a 79 y.o. female, with history of chronic respiratory failure, COPD, chronic diastolic CHF, asthma, morbid obesity, breast cancer, multiple myeloma, history of pulmonary embolism many years ago now on aspirin, hypertension, hyperlipidemia, who is currently enrolled in hospice came to ED with complaints of shortness of breath. Patient said that she has gained 15 pounds weight per her hospice nurse. Also noticed increased feeling of upper and lower extremities. Patient states that usually she uses 2 L of oxygen at home but has been using 4 to 6 L of oxygen. Denies nausea vomiting or diarrhea. Denies chest pain. Denies abdominal pain or dysuria. In the ED, chest x-ray showed pulmonary edema. Patient received Lasix 40 mg IV with good diuretic response. Lab work showed creatinine 2.32, her baseline creatinine of 1.61.     Review of systems:    In addition to the HPI above,    All other systems reviewed and are negative.    Past History of the following :    Past Medical History:  Diagnosis Date  . Allergic rhinitis   . Anxiety   . Arthritis   . Asthma   . Cancer [facility]) 2006   breast cancer right  . CHF (congestive heart failure) ([place])   . COPD (chronic obstructive pulmonary disease) ([place])   . Degenerative disc disease, lumbar   . Diabetes mellitus without complication ([place])   . Dyspnea    walking distances  . Eczema   . GERD (gastroesophageal reflux disease)   . Glaucoma   . History of home oxygen therapy    uses 2 liters ay night and prn  . Hyperlipidemia   . Hypertension   . Low  back pain   . Lupus ([place])    skin  . Neck pain   . Numbness and tingling   . Obesity   . Osteopenia   . Pulmonary embolism ([place]) 01/2012    CT showed multi small PE and coumadized   . Sleep apnea 2010   no cpap used  . Systemic lupus erythematosus ([place])       Past Surgical History:  Procedure Laterality Date  . ABDOMINAL HYSTERECTOMY  1976  . BACK SURGERY  2006   lower  . BREAST BIOPSY Right   . BREAST EXCISIONAL BIOPSY Left   . BREAST LUMPECTOMY Right   . COLONOSCOPY WITH PROPOFOL N/A 03/23/2015   Procedure: COLONOSCOPY WITH PROPOFOL;  Surgeon: Laurence Spates, MD;  Location: [facility];  Service: Endoscopy;  Laterality: N/A;  . Dobtamine myoview  05/02/2008   EF 67% ; LV norm  . DOPPLER ECHOCARDIOGRAPHY  01/25/2012   EF 55 TO 60%; LV norm.  . EXPLORATORY LAPAROTOMY    .  EYE SURGERY Bilateral 2010   lens reaplcments for cataracts   . Lower Extrem. venous doppler  01/25/2012    neg.  . TOE SURGERY  1996   Bunion      Social History:      Social History   Tobacco Use  . Smoking status: Former Research scientist (life sciences)  . Smokeless tobacco: Never Used  . Tobacco comment: Quit 1980  Substance Use Topics  . Alcohol use: No    Alcohol/week: 0.0 standard drinks       Family History :     Family History  Problem Relation Age of Onset  . Heart failure Father   . Heart failure Mother   . Diabetes Brother   . Breast cancer Maternal Aunt   . Breast cancer Paternal Aunt       Home Medications:   Prior to Admission medications   Medication Sig Start Date End Date Taking? Authorizing Provider  acetaminophen (TYLENOL) 500 MG tablet Take 500 mg by mouth every 6 (six) hours as needed for mild pain or moderate pain.   Yes [provider]  albuterol (PROVENTIL HFA;VENTOLIN HFA) 108 (90 BASE) MCG/ACT inhaler Inhale 2 puffs into the lungs See admin instructions. Every 6 hours as needed for SOB but also takes 2 puffs BID scheduled.   Yes [provider]  allopurinol  (ZYLOPRIM) 100 MG tablet Take 1 tablet (100 mg total) by mouth daily. Patient taking differently: Take 100 mg by mouth 2 (two) times daily.  09/09/19  Yes Jessy Oto, MD  aspirin EC 81 MG tablet Take 81 mg by mouth daily.   Yes [provider]  calcium carbonate (OS-CAL) 1250 (500 Ca) MG chewable tablet Chew 1 tablet by mouth daily.   Yes [provider]  cetirizine (ZYRTEC) 10 MG tablet Take 10 mg by mouth daily.   Yes [provider]  cholecalciferol (VITAMIN D) 1000 UNITS tablet Take 1,000 Units by mouth daily.   Yes [provider]  citalopram (CELEXA) 10 MG tablet Take 10 mg by mouth daily.    Yes [provider]  clobetasol ointment (TEMOVATE) 6.06 % Apply 1 application topically 2 (two) times daily. Apply to bottom   Yes [provider]  desonide (DESOWEN) 0.05 % cream Apply 1 application topically 2 (two) times daily. Apply to face   Yes [provider]  diclofenac sodium (VOLTAREN) 1 % GEL Apply 2 g topically daily as needed (leg pain).    Yes [provider]  diltiazem (CARTIA XT) 300 MG 24 hr capsule Take 1 capsule (300 mg total) by mouth daily. Reported on 09/15/2015 Patient taking differently: Take 300 mg by mouth in the morning and at bedtime.  08/06/19  Yes Lendon Colonel, NP  fluticasone [facility]) 50 MCG/ACT nasal spray Place 2 sprays into both nostrils daily. 05/24/19  Yes Eugenie Filler, MD  furosemide (LASIX) 80 MG tablet Take 0.5 tablets (40 mg total) by mouth 2 (two) times daily. 08/06/19  Yes Lendon Colonel, NP  gabapentin (NEURONTIN) 300 MG capsule Take 1 capsule (300 mg total) by mouth 2 (two) times daily. 04/05/18  Yes Jessy Oto, MD  hydrALAZINE (APRESOLINE) 50 MG tablet Take 50 mg in the morning and 100 mg at bedtime Patient taking differently: Take 50 mg by mouth See admin instructions. Take 50 mg in the morning and 100 mg at bedtime 08/06/19  Yes Lendon Colonel, NP    hydroxychloroquine (PLAQUENIL) 200 MG tablet Take 200 mg  by mouth 2 (two) times daily.  08/03/19  Yes [provider]  insulin aspart (NOVOLOG) 100 UNIT/ML injection Inject 4 Units into the skin 2 (two) times daily as needed for high blood sugar.    Yes [provider]  insulin glargine (LANTUS) 100 UNIT/ML injection Inject 0.4 mLs (40 Units total) into the skin daily. Patient taking differently: Inject 40 Units into the skin 2 (two) times daily.  05/23/19 11/04/19 Yes Eugenie Filler, MD  ipratropium-albuterol (DUONEB) 0.5-2.5 (3) MG/3ML SOLN Take 3 mLs by nebulization 3 (three) times daily. Use 3 times daily x4 days then every 6 hours as needed. Patient taking differently: Take 3 mLs by nebulization in the morning and at bedtime.  05/23/19  Yes Eugenie Filler, MD  LEVSIN/SL 0.125 MG SUBL Place 0.0625 mg under the tongue 2 (two) times daily as needed.  10/31/19  Yes [provider]  lisinopril-hydrochlorothiazide (ZESTORETIC) 20-12.5 MG tablet Take 1 tablet by mouth daily. Patient taking differently: Take 1 tablet by mouth in the morning and at bedtime.  08/06/19  Yes Lendon Colonel, NP  metoprolol tartrate (LOPRESSOR) 50 MG tablet Take 1 tablet (50 mg total) by mouth 2 (two) times daily. 08/06/19  Yes Lendon Colonel, NP  OXYGEN Inhale 2 L into the lungs daily as needed (shortness of breath).    Yes [provider]  SYMBICORT 160-4.5 MCG/ACT inhaler Inhale 2 puffs into the lungs 2 (two) times daily. Reported on 09/15/2015 04/22/13  Yes [provider]  traMADol HCl 100 MG TABS Take 100 mg by mouth 2 (two) times daily as needed (for pain).  10/28/19  Yes [provider]  colchicine (COLCRYS) 0.6 MG tablet Take 0.5 tablets (0.3 mg total) by mouth daily. Patient not taking: Reported on 11/04/2019 02/21/19 10/16/19  Arrien, Jimmy Picket, MD  loratadine (CLARITIN) 10 MG tablet Take 1 tablet (10 mg total) by mouth daily. Patient not taking:  Reported on 11/04/2019 05/24/19   Eugenie Filler, MD  pravastatin (PRAVACHOL) 40 MG tablet Take 1 tablet (40 mg total) by mouth at bedtime. Patient not taking: Reported on 11/04/2019 08/06/19   Lendon Colonel, NP     Allergies:     Allergies  Allergen Reactions  . Penicillins Swelling    Has patient had a PCN reaction causing immediate rash, facial/tongue/throat swelling, SOB or lightheadedness with hypotension: Yes Has patient had a PCN reaction causing severe rash involving mucus membranes or skin necrosis: Yes Has patient had a PCN reaction that required hospitalization Yes Has patient had a PCN reaction occurring within the last 10 years: No, more than 10 yrs ago If all of the above answers are "NO", then may proceed with Cephalosporin use.   . Fentanyl Itching  . Peach [Prunus Persica] Hives  . Shellfish Allergy Hives     Physical Exam:   Vitals  Blood pressure (!) 155/77, pulse 72, temperature 98.8 F (37.1 C), temperature source Oral, resp. rate (!) 21, SpO2 99 %.  1.  General: Appears in no acute distress  2. Psychiatric: Alert, oriented x3, intact insight and judgment  3. Neurologic: Cranial nerves II through XII grossly intact, no focal deficit noted  4. HEENMT:  Atraumatic normocephalic, extraocular muscles are intact  5. Respiratory : Bibasilar crackles auscultated, trace edema in the lower extremities  6. Cardiovascular : S1-S2, regular, no murmur auscultated  7. Gastrointestinal:  Abdomen is soft, nontender, no organomegaly     Data Review:    CBC Recent  Labs  Lab 11/04/19 1742  WBC 7.7  HGB 9.0*  HCT 29.3*  PLT 268  MCV 91.3  MCH 28.0  MCHC 30.7  RDW 18.6*   ------------------------------------------------------------------------------------------------------------------  Results for orders placed or performed during the hospital encounter of 11/04/19 (from the past 48 hour(s))  CBC     Status: Abnormal   Collection Time:  11/04/19  5:42 PM  Result Value Ref Range   WBC 7.7 4.0 - 10.5 K/uL   RBC 3.21 (L) 3.87 - 5.11 MIL/uL   Hemoglobin 9.0 (L) 12.0 - 15.0 g/dL   HCT 29.3 (L) 36 - 46 %   MCV 91.3 80.0 - 100.0 fL   MCH 28.0 26.0 - 34.0 pg   MCHC 30.7 30.0 - 36.0 g/dL   RDW 18.6 (H) 11.5 - 15.5 %   Platelets 268 150 - 400 K/uL   nRBC 0.0 0.0 - 0.2 %    Comment: Performed at [facility], [place] [address], [place], [place] [address]  Basic metabolic panel     Status: Abnormal   Collection Time: 11/04/19  5:42 PM  Result Value Ref Range   Sodium 144 135 - 145 mmol/L   Potassium 5.9 (H) 3.5 - 5.1 mmol/L    Comment: SLIGHT HEMOLYSIS   Chloride 107 98 - 111 mmol/L   CO2 25 22 - 32 mmol/L   Glucose, Bld 141 (H) 70 - 99 mg/dL    Comment: Glucose reference range applies only to samples taken after fasting for at least 8 hours.   BUN 60 (H) 8 - 23 mg/dL   Creatinine, Ser 2.32 (H) 0.44 - 1.00 mg/dL   Calcium 9.8 8.9 - 10.3 mg/dL   GFR calc non Af Amer 19 (L) >60 mL/min   GFR calc Af Amer 23 (L) >60 mL/min   Anion gap 12 5 - 15    Comment: Performed at [facility], [place] [address], [place], [place] [address]  Brain natriuretic peptide     Status: Abnormal   Collection Time: 11/04/19  5:42 PM  Result Value Ref Range   B Natriuretic Peptide 211.7 (H) 0.0 - 100.0 pg/mL    Comment: Performed at [facility], [place] [address], [place], [place] [address]  Troponin I (High Sensitivity)     Status: Abnormal   Collection Time: 11/04/19  6:14 PM  Result Value Ref Range   Troponin I (High Sensitivity) 18 (H) <18 ng/L    Comment: (NOTE) Elevated high sensitivity troponin I (hsTnI) values and significant  changes across serial measurements may suggest ACS but many other  chronic and acute conditions are known to elevate hsTnI results.  Refer to the "Links" section for chest pain algorithms and additional  guidance. Performed at [facility], [place] [address], [place], [place] [address]   SARS Coronavirus 2 by RT PCR (hospital order, performed in [facility] hospital lab) Nasopharyngeal Nasopharyngeal Swab     Status: None   Collection Time: 11/04/19  9:21 PM   Specimen: Nasopharyngeal Swab  Result Value Ref Range   SARS Coronavirus 2 NEGATIVE NEGATIVE    Comment: (NOTE) SARS-CoV-2 target nucleic acids are NOT DETECTED.  The SARS-CoV-2 RNA is generally detectable in upper and lower respiratory specimens during the acute phase of infection. The lowest concentration of SARS-CoV-2 viral copies this assay can detect is 250 copies / mL. A negative result does not preclude SARS-CoV-2 infection and should not be used as the sole basis  for treatment or other patient management decisions.  A negative result may occur with improper specimen collection / handling, submission of specimen other than nasopharyngeal swab, presence of viral mutation(s) within the areas targeted by this assay, and inadequate number of viral copies (<250 copies / mL). A negative result must be combined with clinical observations, patient history, and epidemiological information.  Fact Sheet for Patients:   StrictlyIdeas.no  Fact Sheet for Healthcare Providers: BankingDealers.co.za  This test is not yet approved or  cleared by the [place] FDA and has been authorized for detection and/or diagnosis of SARS-CoV-2 by FDA under an Emergency Use Authorization (EUA).  This EUA will remain in effect (meaning this test can be used) for the duration of the COVID-19 declaration under Section 564(b)(1) of the Act, 21 U.S.C. section 360bbb-3(b)(1), unless the authorization is terminated or revoked sooner.  Performed at [facility], [place] [address] [place], [place] [address]   Troponin I (High Sensitivity)     Status: None   Collection Time: 11/04/19  9:30 PM  Result Value Ref Range   Troponin I  (High Sensitivity) 16 <18 ng/L    Comment: (NOTE) Elevated high sensitivity troponin I (hsTnI) values and significant  changes across serial measurements may suggest ACS but many other  chronic and acute conditions are known to elevate hsTnI results.  Refer to the "Links" section for chest pain algorithms and additional  guidance. Performed at [facility], [place] [address] [place], [place] [address]     Chemistries  Recent Labs  Lab 11/04/19 1742  NA 144  K 5.9*  CL 107  CO2 25  GLUCOSE 141*  BUN 60*  CREATININE 2.32*  CALCIUM 9.8   ------------------------------------------------------------------------------------------------------------------   --------------------------------------------------------------------------------------------------------------- Urine analysis:    Component Value Date/Time   COLORURINE STRAW (A) 05/21/2019 1147   APPEARANCEUR CLEAR 05/21/2019 1147   LABSPEC 1.009 05/21/2019 1147   PHURINE 8.0 05/21/2019 1147   GLUCOSEU NEGATIVE 05/21/2019 1147   HGBUR NEGATIVE 05/21/2019 1147   BILIRUBINUR NEGATIVE 05/21/2019 1147   KETONESUR NEGATIVE 05/21/2019 1147   PROTEINUR 30 (A) 05/21/2019 1147   UROBILINOGEN 0.2 02/04/2015 1547   NITRITE NEGATIVE 05/21/2019 1147   LEUKOCYTESUR NEGATIVE 05/21/2019 1147      Imaging Results:    DG Chest 2 View  Result Date: 11/04/2019 CLINICAL DATA:  Wheezing EXAM: CHEST - 2 VIEW COMPARISON:  05/21/2019, 02/15/2019 FINDINGS: No pleural effusion or focal consolidation. Mild cardiomegaly with vascular congestion. Mild diffuse interstitial and hazy pulmonary opacity. Aortic atherosclerosis. No pneumothorax. IMPRESSION: Cardiomegaly with vascular congestion and mild diffuse interstitial and hazy pulmonary opacity, suspect for mild pulmonary edema. Electronically Signed   By: Donavan Foil M.D.   On: 11/04/2019 19:07    My personal review of EKG: Rhythm NSR, right bundle branch block, left anterior  fascicle block   Assessment & Plan:    Active Problems:   CHF (congestive heart failure) ([place])   1. Acute on chronic diastolic CHF-patient presenting with worsening shortness of breath.  Last echocardiogram from 2017 showed EF 55 to 60% with grade 1 diastolic dysfunction.  Patient received Lasix 40 mg IV in the ED with good diuretics once.  Will start Lasix 40 mg IV every 12 hours.  Will obtain strict intake and output.  Follow BMP DM. 2. Acute kidney injury on CKD stage IIIb-patient baseline creatinine is around 1.61, presented with creatinine of 2.32.  Lasix 40 mg IV has been given.  Follow BMP in a.m.  3. Diabetes mellitus type 2-continue Lantus 40 units subcu daily .  Will start sliding scale insulin with NovoLog.   4. History of multiple myeloma-patient is on hospice   DVT Prophylaxis-   Lovenox   AM Labs Ordered, also please review Full Orders  Family Communication: Admission, patients condition and plan of care including tests being ordered have been discussed with the patient who indicate understanding and agree with the plan and Code Status.  Code Status: DNR  Admission status: Observation  Time spent in minutes : 60 minutes   Kenadee Gates S Rayanna Matusik M.D

## 2019-11-05 NOTE — ED Notes (Signed)
Message sent to pharmacy for patients unverified medications.

## 2019-11-05 NOTE — Discharge Summary (Signed)
[name] [name] WLS:[id number] DOB: 12-Oct-1940 DOA: 11/04/2019  PCP: Nolene Ebbs, MD  Admit date: 11/04/2019  Discharge date: 11/05/2019  Admitted From: Home with hospice   disposition: Home with hospice   Recommendations for Outpatient Follow-up:   Follow up with PCP in 1-2 weeks   Home Health: Hospice Equipment/Devices: Home oxygen, patient already has Consultations: None Discharge Condition: Improved CODE STATUS: DNR Diet Recommendation: Heart Healthy low-sodium  Diet Order            Diet Carb Modified Fluid consistency: Thin; Room service appropriate? Yes  Diet effective now           Diet - low sodium heart healthy                  Chief Complaint  Patient presents with  . Arm Swelling    HOSPICE PT     Brief history of present illness from the day of admission and additional interim summary     [name] [name]  is a 79 y.o. female, with history of chronic respiratory failure, COPD, chronic diastolic CHF, asthma, morbid obesity, breast cancer, multiple myeloma, history of pulmonary embolism many years ago now on aspirin, hypertension, hyperlipidemia, who is currently enrolled in hospice came to ED with complaints of shortness of breath. Patient said that she has gained 15 pounds weight per her hospice nurse. Also noticed increased feeling of upper and lower extremities. Patient states that usually she uses 2 L of oxygen at home but has been using 5 L of oxygen for past several months.  In the ED patient was noted to be acutely short of breath with increased oxygen requirement.  She chest x-ray revealed pulmonary edema.  She was also noted to have acute renal failure with a creatinine of 2.3 with her baseline creatinine 1.7                                                                 Hospital Course    Patient was treated for decompensated HFpEF.  She was treated with Lasix 40 mg IV every 12 hours.  She responded well with over 2100 cc urine output.  She is back on her baseline oxygen of 5 L.  Today patient states that she felt back to baseline and feels that she can go home with her usual hospice support.  Creatinine was also noted to be improved with improved [employer] forces and diuresis.  Creatinine is now down to 1.9, very close to her baseline of 1.7.  Patient is to be discharged home on her usual medications with no change.  We will request home health RN to assist with evaluation of CHF and pulmonary edema with IV Lasix protocol as warranted.  Patient states she is comfortable with this plan.   Discharge diagnosis  Active Problems:   CHF (congestive heart failure) ([place])    Discharge instructions    Discharge Instructions    Call MD for:  difficulty breathing, headache or visual disturbances   Complete by: As directed    Diet - low sodium heart healthy   Complete by: As directed    Discharge instructions   Complete by: As directed    Continue your previous home medications as per your physician and hospice plans.   Increase activity slowly   Complete by: As directed       Discharge Medications   Allergies as of 11/05/2019      Reactions   Penicillins Swelling   Has patient had a PCN reaction causing immediate rash, facial/tongue/throat swelling, SOB or lightheadedness with hypotension: Yes Has patient had a PCN reaction causing severe rash involving mucus membranes or skin necrosis: Yes Has patient had a PCN reaction that required hospitalization Yes Has patient had a PCN reaction occurring within the last 10 years: No, more than 10 yrs ago If all of the above answers are "NO", then may proceed with Cephalosporin use.   Fentanyl Itching   Peach [prunus Persica] Hives   Shellfish Allergy Hives      Medication List    TAKE these medications   acetaminophen  500 MG tablet Commonly known as: TYLENOL Take 500 mg by mouth every 6 (six) hours as needed for mild pain or moderate pain.   albuterol 108 (90 Base) MCG/ACT inhaler Commonly known as: VENTOLIN HFA Inhale 2 puffs into the lungs See admin instructions. Every 6 hours as needed for SOB but also takes 2 puffs BID scheduled.   allopurinol 100 MG tablet Commonly known as: ZYLOPRIM Take 1 tablet (100 mg total) by mouth daily. What changed: when to take this   aspirin EC 81 MG tablet Take 81 mg by mouth daily.   calcium carbonate 1250 (500 Ca) MG chewable tablet Commonly known as: OS-CAL Chew 1 tablet by mouth daily.   cetirizine 10 MG tablet Commonly known as: ZYRTEC Take 10 mg by mouth daily.   cholecalciferol 1000 units tablet Commonly known as: VITAMIN D Take 1,000 Units by mouth daily.   citalopram 10 MG tablet Commonly known as: CELEXA Take 10 mg by mouth daily.   clobetasol ointment 0.05 % Commonly known as: TEMOVATE Apply 1 application topically 2 (two) times daily. Apply to bottom   colchicine 0.6 MG tablet Commonly known as: Colcrys Take 0.5 tablets (0.3 mg total) by mouth daily.   desonide 0.05 % cream Commonly known as: DESOWEN Apply 1 application topically 2 (two) times daily. Apply to face   diclofenac sodium 1 % Gel Commonly known as: VOLTAREN Apply 2 g topically daily as needed (leg pain).   diltiazem 300 MG 24 hr capsule Commonly known as: Cartia XT Take 1 capsule (300 mg total) by mouth daily. Reported on 09/15/2015 What changed:   when to take this  additional instructions   fluticasone 50 MCG/ACT nasal spray Commonly known as: FLONASE Place 2 sprays into both nostrils daily.   furosemide 80 MG tablet Commonly known as: LASIX Take 0.5 tablets (40 mg total) by mouth 2 (two) times daily.   gabapentin 300 MG capsule Commonly known as: NEURONTIN Take 1 capsule (300 mg total) by mouth 2 (two) times daily.   hydrALAZINE 50 MG tablet Commonly  known as: APRESOLINE Take 50 mg in the morning and 100 mg at bedtime What changed:   how much to take  how to take this  when to take this   hydroxychloroquine 200 MG tablet Commonly known as: PLAQUENIL Take 200 mg by mouth 2 (two) times daily.   insulin aspart 100 UNIT/ML injection Commonly known as: novoLOG Inject 4 Units into the skin 2 (two) times daily as needed for high blood sugar.   insulin glargine 100 UNIT/ML injection Commonly known as: LANTUS Inject 0.4 mLs (40 Units total) into the skin daily. What changed: when to take this   ipratropium-albuterol 0.5-2.5 (3) MG/3ML Soln Commonly known as: DUONEB Take 3 mLs by nebulization 3 (three) times daily. Use 3 times daily x4 days then every 6 hours as needed. What changed:   when to take this  additional instructions   Levsin/SL 0.125 MG Subl Generic drug: Hyoscyamine Sulfate SL Place 0.0625 mg under the tongue 2 (two) times daily as needed.   lisinopril-hydrochlorothiazide 20-12.5 MG tablet Commonly known as: ZESTORETIC Take 1 tablet by mouth daily. What changed: when to take this   loratadine 10 MG tablet Commonly known as: CLARITIN Take 1 tablet (10 mg total) by mouth daily.   metoprolol tartrate 50 MG tablet Commonly known as: LOPRESSOR Take 1 tablet (50 mg total) by mouth 2 (two) times daily.   OXYGEN Inhale 2 L into the lungs daily as needed (shortness of breath).   pravastatin 40 MG tablet Commonly known as: PRAVACHOL Take 1 tablet (40 mg total) by mouth at bedtime.   Symbicort 160-4.5 MCG/ACT inhaler Generic drug: budesonide-formoterol Inhale 2 puffs into the lungs 2 (two) times daily. Reported on 09/15/2015   traMADol HCl 100 MG Tabs Take 100 mg by mouth 2 (two) times daily as needed (for pain).            Durable Medical Equipment  (From admission, onward)         Start     Ordered   11/05/19 1318  For home use only DME Other see comment  Once       Comments:  [employer] [id number]  Question:  Length of Need  Answer:  Lifetime   11/05/19 1318           Follow-up Information    [facility] Follow up.   Why: Home Hospice RN will call to arrange visit Contact information: [place] [address] [phone]               Major procedures and Radiology Reports - PLEASE review detailed and final reports thoroughly  -        DG Chest 2 View  Result Date: 11/04/2019 CLINICAL DATA:  Wheezing EXAM: CHEST - 2 VIEW COMPARISON:  05/21/2019, 02/15/2019 FINDINGS: No pleural effusion or focal consolidation. Mild cardiomegaly with vascular congestion. Mild diffuse interstitial and hazy pulmonary opacity. Aortic atherosclerosis. No pneumothorax. IMPRESSION: Cardiomegaly with vascular congestion and mild diffuse interstitial and hazy pulmonary opacity, suspect for mild pulmonary edema. Electronically Signed   By: Donavan Foil M.D.   On: 11/04/2019 19:07    Micro Results    Recent Results (from the past 240 hour(s))  SARS Coronavirus 2 by RT PCR (hospital order, performed in [facility] hospital lab) Nasopharyngeal Nasopharyngeal Swab     Status: None   Collection Time: 11/04/19  9:21 PM   Specimen: Nasopharyngeal Swab  Result Value Ref Range Status   SARS Coronavirus 2 NEGATIVE NEGATIVE Final    Comment: (NOTE) SARS-CoV-2 target nucleic acids are NOT DETECTED.  The SARS-CoV-2 RNA is generally detectable in  upper and lower respiratory specimens during the acute phase of infection. The lowest concentration of SARS-CoV-2 viral copies this assay can detect is 250 copies / mL. A negative result does not preclude SARS-CoV-2 infection and should not be used as the sole basis for treatment or other patient management decisions.  A negative result may occur with improper specimen collection / handling, submission of specimen other than nasopharyngeal swab, presence of viral mutation(s) within the areas targeted  by this assay, and inadequate number of viral copies (<250 copies / mL). A negative result must be combined with clinical observations, patient history, and epidemiological information.  Fact Sheet for Patients:   StrictlyIdeas.no  Fact Sheet for Healthcare Providers: BankingDealers.co.za  This test is not yet approved or  cleared by the [place] FDA and has been authorized for detection and/or diagnosis of SARS-CoV-2 by FDA under an Emergency Use Authorization (EUA).  This EUA will remain in effect (meaning this test can be used) for the duration of the COVID-19 declaration under Section 564(b)(1) of the Act, 21 U.S.C. section 360bbb-3(b)(1), unless the authorization is terminated or revoked sooner.  Performed at [facility], [place] [address], [place], [place] [address]     Today   Subjective    [name] [name] feels much improved since admission.  Feels ready to go home.  Denies chest pain, shortness of breath or abdominal pain.  Feels they can take care of themselves with the resources they have at home.  Objective   Blood pressure (!) 142/60, pulse 64, temperature 98 F (36.7 C), temperature source Oral, resp. rate 20, SpO2 97 %.  No intake or output data in the 24 hours ending 11/05/19 1406  Exam General:  Barrel chested kyphotic female sitting up in bed in no acute distress with oxygen via nasal cannula.  Patient is able to speak in full sentences without difficulty. Eyes: sclera anicteric, conjuctiva mild injection bilaterally CVS: S1-S2, regular  Respiratory:   Reasonable air entry bilaterally with no crackles notable.  GI: NABS, soft, NT, obese. LE:  Patient had resolution of edema, no edema noted.   Neuro: A/O x 3, Moving all extremities equally with normal strength, CN 3-12 intact, grossly nonfocal.  Psych: patient is logical and coherent, judgement and insight appear normal, mood and affect  appropriate to situation.    Data Review   CBC w Diff:  Lab Results  Component Value Date   WBC 8.8 11/05/2019   HGB 9.0 (L) 11/05/2019   HGB 10.3 (L) 12/28/2018   HGB 10.9 (L) 03/15/2017   HCT 30.4 (L) 11/05/2019   HCT 33.1 (L) 03/15/2017   PLT 342 11/05/2019   PLT 461 (H) 12/28/2018   PLT 316 03/15/2017   LYMPHOPCT 8 06/27/2019   LYMPHOPCT 10.3 (L) 03/15/2017   MONOPCT 5 06/27/2019   MONOPCT 3.9 03/15/2017   EOSPCT 3 06/27/2019   EOSPCT 0.1 03/15/2017   BASOPCT 0 06/27/2019   BASOPCT 1.0 03/15/2017    CMP:  Lab Results  Component Value Date   NA 142 11/05/2019   NA 138 11/12/2018   NA 140 03/15/2017   K 4.5 11/05/2019   K 4.3 03/15/2017   CL 103 11/05/2019   CO2 31 11/05/2019   CO2 23 03/15/2017   BUN 58 (H) 11/05/2019   BUN 22 11/12/2018   BUN 36.7 (H) 03/15/2017   CREATININE 1.99 (H) 11/05/2019   CREATININE 1.61 (H) 06/27/2019   CREATININE 1.91 (H) 12/20/2018   CREATININE 2.0 (H) 03/15/2017  GLU 114 02/22/2015   PROT 7.3 11/05/2019   PROT 7.8 03/15/2017   PROT 7.3 03/15/2017   ALBUMIN 3.4 (L) 11/05/2019   ALBUMIN 3.6 03/15/2017   BILITOT 0.2 (L) 11/05/2019   BILITOT 0.2 (L) 06/27/2019   BILITOT 0.24 03/15/2017   ALKPHOS 77 11/05/2019   ALKPHOS 95 03/15/2017   AST 21 11/05/2019   AST 21 06/27/2019   AST 22 03/15/2017   ALT 16 11/05/2019   ALT 20 06/27/2019   ALT 18 03/15/2017  .   Total Time in preparing paper work, data evaluation and todays exam - 35 minutes  Vashti Hey M.D on 11/05/2019 at 2:06 PM  [facility]   Office  [phone]

## 2019-11-12 ENCOUNTER — Other Ambulatory Visit: Payer: Self-pay | Admitting: Specialist

## 2019-11-12 NOTE — Telephone Encounter (Signed)
Sent request to Dr. Nitka 

## 2019-11-12 NOTE — Telephone Encounter (Signed)
Received call from [facility] (Caregiver) advised patient need Rx refilled Tramadol      Patient uses  Product/process development scientist on [employer] and [place]    The number to contact Anderson [place] is [phone]

## 2019-11-18 ENCOUNTER — Ambulatory Visit: Payer: Medicare HMO | Admitting: Specialist

## 2019-11-19 ENCOUNTER — Observation Stay (HOSPITAL_COMMUNITY)
Admission: EM | Admit: 2019-11-19 | Discharge: 2019-11-21 | Disposition: A | Discharge status: Home / Self Care | Attending: Internal Medicine | Admitting: Internal Medicine

## 2019-11-19 ENCOUNTER — Other Ambulatory Visit: Payer: Self-pay

## 2019-11-19 ENCOUNTER — Encounter (HOSPITAL_COMMUNITY): Payer: Self-pay | Admitting: Emergency Medicine

## 2019-11-19 ENCOUNTER — Emergency Department (HOSPITAL_COMMUNITY)

## 2019-11-19 DIAGNOSIS — Z87891 Personal history of nicotine dependence: Secondary | ICD-10-CM | POA: Diagnosis not present

## 2019-11-19 DIAGNOSIS — M329 Systemic lupus erythematosus, unspecified: Secondary | ICD-10-CM | POA: Insufficient documentation

## 2019-11-19 DIAGNOSIS — I5033 Acute on chronic diastolic (congestive) heart failure: Secondary | ICD-10-CM | POA: Diagnosis not present

## 2019-11-19 DIAGNOSIS — N1832 Chronic kidney disease, stage 3b: Secondary | ICD-10-CM | POA: Diagnosis not present

## 2019-11-19 DIAGNOSIS — Z794 Long term (current) use of insulin: Secondary | ICD-10-CM | POA: Insufficient documentation

## 2019-11-19 DIAGNOSIS — J449 Chronic obstructive pulmonary disease, unspecified: Secondary | ICD-10-CM | POA: Diagnosis not present

## 2019-11-19 DIAGNOSIS — E1122 Type 2 diabetes mellitus with diabetic chronic kidney disease: Secondary | ICD-10-CM | POA: Diagnosis not present

## 2019-11-19 DIAGNOSIS — E1159 Type 2 diabetes mellitus with other circulatory complications: Secondary | ICD-10-CM | POA: Diagnosis present

## 2019-11-19 DIAGNOSIS — E785 Hyperlipidemia, unspecified: Secondary | ICD-10-CM | POA: Insufficient documentation

## 2019-11-19 DIAGNOSIS — R079 Chest pain, unspecified: Secondary | ICD-10-CM | POA: Diagnosis present

## 2019-11-19 DIAGNOSIS — Z9981 Dependence on supplemental oxygen: Secondary | ICD-10-CM | POA: Diagnosis not present

## 2019-11-19 DIAGNOSIS — Z7982 Long term (current) use of aspirin: Secondary | ICD-10-CM | POA: Insufficient documentation

## 2019-11-19 DIAGNOSIS — R778 Other specified abnormalities of plasma proteins: Secondary | ICD-10-CM

## 2019-11-19 DIAGNOSIS — J9621 Acute and chronic respiratory failure with hypoxia: Secondary | ICD-10-CM | POA: Diagnosis not present

## 2019-11-19 DIAGNOSIS — I509 Heart failure, unspecified: Secondary | ICD-10-CM

## 2019-11-19 DIAGNOSIS — Z79899 Other long term (current) drug therapy: Secondary | ICD-10-CM | POA: Diagnosis not present

## 2019-11-19 DIAGNOSIS — I130 Hypertensive heart and chronic kidney disease with heart failure and stage 1 through stage 4 chronic kidney disease, or unspecified chronic kidney disease: Principal | ICD-10-CM | POA: Insufficient documentation

## 2019-11-19 DIAGNOSIS — Z20822 Contact with and (suspected) exposure to covid-19: Secondary | ICD-10-CM | POA: Diagnosis not present

## 2019-11-19 DIAGNOSIS — E119 Type 2 diabetes mellitus without complications: Secondary | ICD-10-CM

## 2019-11-19 DIAGNOSIS — I5032 Chronic diastolic (congestive) heart failure: Secondary | ICD-10-CM | POA: Diagnosis present

## 2019-11-19 LAB — CBC
HCT: 31.2 % — ABNORMAL LOW (ref 36.0–46.0)
Hemoglobin: 8.9 g/dL — ABNORMAL LOW (ref 12.0–15.0)
MCH: 26.8 pg (ref 26.0–34.0)
MCHC: 28.5 g/dL — ABNORMAL LOW (ref 30.0–36.0)
MCV: 94 fL (ref 80.0–100.0)
Platelets: 283 10*3/uL (ref 150–400)
RBC: 3.32 MIL/uL — ABNORMAL LOW (ref 3.87–5.11)
RDW: 16.6 % — ABNORMAL HIGH (ref 11.5–15.5)
WBC: 10.5 10*3/uL (ref 4.0–10.5)
nRBC: 0 % (ref 0.0–0.2)

## 2019-11-19 LAB — BASIC METABOLIC PANEL
Anion gap: 11 (ref 5–15)
BUN: 26 mg/dL — ABNORMAL HIGH (ref 8–23)
CO2: 27 mmol/L (ref 22–32)
Calcium: 9.5 mg/dL (ref 8.9–10.3)
Chloride: 103 mmol/L (ref 98–111)
Creatinine, Ser: 1.47 mg/dL — ABNORMAL HIGH (ref 0.44–1.00)
GFR calc Af Amer: 39 mL/min — ABNORMAL LOW (ref >60)
GFR calc non Af Amer: 34 mL/min — ABNORMAL LOW (ref >60)
Glucose, Bld: 158 mg/dL — ABNORMAL HIGH (ref 70–99)
Potassium: 4.3 mmol/L (ref 3.5–5.1)
Sodium: 141 mmol/L (ref 135–145)

## 2019-11-19 LAB — TROPONIN I (HIGH SENSITIVITY)
Troponin I (High Sensitivity): 47 ng/L — ABNORMAL HIGH (ref <18)
Troponin I (High Sensitivity): 196 ng/L (ref <18)

## 2019-11-19 LAB — SARS CORONAVIRUS 2 BY RT PCR (HOSPITAL ORDER, PERFORMED IN ~~LOC~~ HOSPITAL LAB): SARS Coronavirus 2: NEGATIVE

## 2019-11-19 LAB — CBG MONITORING, ED: Glucose-Capillary: 124 mg/dL — ABNORMAL HIGH (ref 70–99)

## 2019-11-19 LAB — BRAIN NATRIURETIC PEPTIDE: B Natriuretic Peptide: 204.1 pg/mL — ABNORMAL HIGH (ref 0.0–100.0)

## 2019-11-19 MED ORDER — ASPIRIN EC 81 MG PO TBEC
81.0000 mg | DELAYED_RELEASE_TABLET | Freq: Every day | ORAL | Status: DC
  Administered 2019-11-19 – 2019-11-21 (×3): 81 mg via ORAL
  Filled 2019-11-19 (×3): qty 1

## 2019-11-19 MED ORDER — MOMETASONE FURO-FORMOTEROL FUM 200-5 MCG/ACT IN AERO
2.0000 | INHALATION_SPRAY | Freq: Two times a day (BID) | RESPIRATORY_TRACT | Status: DC
  Administered 2019-11-19 – 2019-11-21 (×4): 2 via RESPIRATORY_TRACT
  Filled 2019-11-19: qty 8.8

## 2019-11-19 MED ORDER — DILTIAZEM HCL ER COATED BEADS 180 MG PO CP24
300.0000 mg | ORAL_CAPSULE | Freq: Every day | ORAL | Status: DC
  Administered 2019-11-20 – 2019-11-21 (×2): 300 mg via ORAL
  Filled 2019-11-19 (×2): qty 1

## 2019-11-19 MED ORDER — METOPROLOL TARTRATE 50 MG PO TABS
50.0000 mg | ORAL_TABLET | Freq: Two times a day (BID) | ORAL | Status: DC
  Administered 2019-11-20 – 2019-11-21 (×3): 50 mg via ORAL
  Filled 2019-11-19 (×3): qty 1

## 2019-11-19 MED ORDER — POLYETHYLENE GLYCOL 3350 17 G PO PACK: 17.0000 g | PACK | Freq: Every day | ORAL | Status: DC | PRN

## 2019-11-19 MED ORDER — INSULIN GLARGINE 100 UNIT/ML ~~LOC~~ SOLN
20.0000 [IU] | Freq: Every day | SUBCUTANEOUS | Status: DC
  Administered 2019-11-20 – 2019-11-21 (×2): 20 [IU] via SUBCUTANEOUS
  Filled 2019-11-19 (×2): qty 0.2

## 2019-11-19 MED ORDER — ACETAMINOPHEN 650 MG RE SUPP: 650.0000 mg | Freq: Four times a day (QID) | RECTAL | Status: DC | PRN

## 2019-11-19 MED ORDER — TRAZODONE HCL 50 MG PO TABS
50.0000 mg | ORAL_TABLET | Freq: Every evening | ORAL | Status: DC | PRN
  Administered 2019-11-19: 50 mg via ORAL
  Filled 2019-11-19: qty 1

## 2019-11-19 MED ORDER — GABAPENTIN 300 MG PO CAPS
300.0000 mg | ORAL_CAPSULE | Freq: Two times a day (BID) | ORAL | Status: DC
  Administered 2019-11-19 – 2019-11-21 (×4): 300 mg via ORAL
  Filled 2019-11-19 (×4): qty 1

## 2019-11-19 MED ORDER — METOPROLOL TARTRATE 25 MG PO TABS
50.0000 mg | ORAL_TABLET | Freq: Once | ORAL | Status: AC
  Administered 2019-11-19: 50 mg via ORAL
  Filled 2019-11-19: qty 2

## 2019-11-19 MED ORDER — ENOXAPARIN SODIUM 40 MG/0.4ML ~~LOC~~ SOLN
40.0000 mg | SUBCUTANEOUS | Status: DC
  Administered 2019-11-20 – 2019-11-21 (×2): 40 mg via SUBCUTANEOUS
  Filled 2019-11-19 (×3): qty 0.4

## 2019-11-19 MED ORDER — ACETAMINOPHEN 325 MG PO TABS: 650.0000 mg | ORAL_TABLET | Freq: Four times a day (QID) | ORAL | Status: DC | PRN

## 2019-11-19 MED ORDER — PRAVASTATIN SODIUM 40 MG PO TABS
40.0000 mg | ORAL_TABLET | Freq: Every day | ORAL | Status: DC
  Administered 2019-11-19 – 2019-11-21 (×3): 40 mg via ORAL
  Filled 2019-11-19 (×3): qty 1

## 2019-11-19 MED ORDER — HYDROXYCHLOROQUINE SULFATE 200 MG PO TABS
200.0000 mg | ORAL_TABLET | Freq: Two times a day (BID) | ORAL | Status: DC
  Administered 2019-11-19 – 2019-11-21 (×4): 200 mg via ORAL
  Filled 2019-11-19 (×5): qty 1

## 2019-11-19 MED ORDER — IPRATROPIUM-ALBUTEROL 0.5-2.5 (3) MG/3ML IN SOLN: 3.0000 mL | Freq: Four times a day (QID) | RESPIRATORY_TRACT | Status: DC | PRN

## 2019-11-19 MED ORDER — ASPIRIN 81 MG PO CHEW
324.0000 mg | CHEWABLE_TABLET | Freq: Once | ORAL | Status: AC
  Administered 2019-11-19: 324 mg via ORAL
  Filled 2019-11-19: qty 4

## 2019-11-19 MED ORDER — FUROSEMIDE 10 MG/ML IJ SOLN
40.0000 mg | Freq: Once | INTRAMUSCULAR | Status: AC
  Administered 2019-11-19: 40 mg via INTRAVENOUS
  Filled 2019-11-19: qty 4

## 2019-11-19 MED ORDER — ONDANSETRON HCL 4 MG/2ML IJ SOLN
4.0000 mg | Freq: Four times a day (QID) | INTRAMUSCULAR | Status: DC | PRN
  Administered 2019-11-21: 4 mg via INTRAVENOUS
  Filled 2019-11-19: qty 2

## 2019-11-19 MED ORDER — DILTIAZEM HCL ER COATED BEADS 300 MG PO CP24
300.0000 mg | ORAL_CAPSULE | Freq: Once | ORAL | Status: AC
  Administered 2019-11-19: 300 mg via ORAL
  Filled 2019-11-19: qty 1

## 2019-11-19 MED ORDER — CITALOPRAM HYDROBROMIDE 10 MG PO TABS
10.0000 mg | ORAL_TABLET | Freq: Every day | ORAL | Status: DC
  Administered 2019-11-20 – 2019-11-21 (×2): 10 mg via ORAL
  Filled 2019-11-19 (×2): qty 1

## 2019-11-19 MED ORDER — INSULIN ASPART 100 UNIT/ML ~~LOC~~ SOLN
0.0000 [IU] | SUBCUTANEOUS | Status: DC
  Administered 2019-11-19: 1 [IU] via SUBCUTANEOUS
  Administered 2019-11-20: 2 [IU] via SUBCUTANEOUS
  Administered 2019-11-20: 3 [IU] via SUBCUTANEOUS
  Administered 2019-11-20 (×2): 1 [IU] via SUBCUTANEOUS
  Administered 2019-11-21 (×2): 2 [IU] via SUBCUTANEOUS
  Administered 2019-11-21: 1 [IU] via SUBCUTANEOUS
  Administered 2019-11-21: 2 [IU] via SUBCUTANEOUS

## 2019-11-19 MED ORDER — ONDANSETRON HCL 4 MG PO TABS: 4.0000 mg | ORAL_TABLET | Freq: Four times a day (QID) | ORAL | Status: DC | PRN

## 2019-11-19 NOTE — H&P (Signed)
[facility] History and Physical  [name] [name] QXI:[id number] DOB: Jan 15, 1941 DOA: 11/19/2019  Referring physician: Rodell Perna, PA-C PCP: Nolene Ebbs, MD   Chief Complaint: chest pain  HPI: [name] [name] is a 79 y.o. female with history of CHF, COPD on 6L home O2, DM2, lupus, hx PE, OSA, presents after episode of chest pain.   She reports she was getting hair done and upon walking to styling chair she developed central chest pain, sweating Improved with rest after about 20 minutes at which point EMTs arrived and brought her to the ED She currently denies any chest pain, unusual SOB She reports that her water pill dose was recently changed from lasix 80mg  BID to Bumex 0.5mg  BID per her doctors No recent change in weight She denies missing any doses of her medications Did not have any salty foods or other dietary indiscretions  On review of chart she was admitted overnight from 7/19-7/20 for acute on chronic CHF which responded well to diuresis.   She reports she is not sure if she wants to be on hospice anymore Reaffirms desire to de DNR/DNI  In the ED initially soft BP's but singificantly improved and then hypertensive, otherwise normal VS on home 6L O2. Labs notable for unremarkable BMP w Cr at baseline (1.47), BNP around baseline since last admission 211>204, baseline CBC, neg COVID test. Trop notable for elevation 47 and rising to 196 after two hours. ED provider consulted with cardiology, given overall medical picture and lack of chest pain at present they did not recommend further intervention or trending of trops. CXR showed findings c/f volume overload. ECG showed no changes compared to prior.   After discussion of options including a dose of IV lasix followed by discharge home, patient elected to be admitted for IV diuresis overnight for likely CHF exacerbation.  Review of Systems:  Pertinent positives and negative per HPI, all others reviewed and negative   Past  Medical History:  Diagnosis Date  . Allergic rhinitis   . Anxiety   . Arthritis   . Asthma   . Cancer [facility]) 2006   breast cancer right  . CHF (congestive heart failure) ([place])   . COPD (chronic obstructive pulmonary disease) ([place])   . Degenerative disc disease, lumbar   . Diabetes mellitus without complication ([place])   . Dyspnea    walking distances  . Eczema   . GERD (gastroesophageal reflux disease)   . Glaucoma   . History of home oxygen therapy    uses 2 liters ay night and prn  . Hyperlipidemia   . Hypertension   . Low back pain   . Lupus ([place])    skin  . Neck pain   . Numbness and tingling   . Obesity   . Osteopenia   . Pulmonary embolism ([place]) 01/2012    CT showed multi small PE and coumadized   . Sleep apnea 2010   no cpap used  . Systemic lupus erythematosus ([place])    Past Surgical History:  Procedure Laterality Date  . ABDOMINAL HYSTERECTOMY  1976  . BACK SURGERY  2006   lower  . BREAST BIOPSY Right   . BREAST EXCISIONAL BIOPSY Left   . BREAST LUMPECTOMY Right   . COLONOSCOPY WITH PROPOFOL N/A 03/23/2015   Procedure: COLONOSCOPY WITH PROPOFOL;  Surgeon: Laurence Spates, MD;  Location: [facility] ENDOSCOPY;  Service: Endoscopy;  Laterality: N/A;  . Dobtamine myoview  05/02/2008   EF 67% ; LV norm  .  DOPPLER ECHOCARDIOGRAPHY  01/25/2012   EF 55 TO 60%; LV norm.  . EXPLORATORY LAPAROTOMY    . EYE SURGERY Bilateral 2010   lens reaplcments for cataracts   . Lower Extrem. venous doppler  01/25/2012    neg.  . TOE SURGERY  1996   Bunion   Social History:  reports that she has quit smoking. She has never used smokeless tobacco. She reports that she does not drink alcohol and does not use drugs.  Allergies  Allergen Reactions  . Penicillins Swelling    Has patient had a PCN reaction causing immediate rash, facial/tongue/throat swelling, SOB or lightheadedness with hypotension: Yes Has patient had a PCN reaction causing severe rash involving mucus membranes or skin  necrosis: Yes Has patient had a PCN reaction that required hospitalization Yes Has patient had a PCN reaction occurring within the last 10 years: No, more than 10 yrs ago If all of the above answers are "NO", then may proceed with Cephalosporin use.   . Fentanyl Itching  . Peach [Prunus Persica] Hives  . Shellfish Allergy Hives    Family History  Problem Relation Age of Onset  . Heart failure Father   . Heart failure Mother   . Diabetes Brother   . Breast cancer Maternal Aunt   . Breast cancer Paternal Aunt      Prior to Admission medications   Medication Sig Start Date End Date Taking? Authorizing Provider  acetaminophen (TYLENOL) 500 MG tablet Take 500 mg by mouth every 6 (six) hours as needed for mild pain or moderate pain.   Yes [provider]  aspirin EC 81 MG tablet Take 81 mg by mouth daily.   Yes [provider]  bumetanide (BUMEX) 0.5 MG tablet Take 0.5 mg by mouth 2 (two) times daily.   Yes [provider]  cetirizine (ZYRTEC) 10 MG tablet Take 10 mg by mouth daily.   Yes [provider]  citalopram (CELEXA) 10 MG tablet Take 10 mg by mouth daily.    Yes [provider]  desonide (DESOWEN) 0.05 % cream Apply 1 application topically 2 (two) times daily. Apply to face   Yes [provider]  diclofenac sodium (VOLTAREN) 1 % GEL Apply 2 g topically daily as needed (leg pain).    Yes [provider]  diltiazem (CARTIA XT) 300 MG 24 hr capsule Take 1 capsule (300 mg total) by mouth daily. Reported on 09/15/2015 Patient taking differently: Take 300 mg by mouth in the morning and at bedtime.  08/06/19  Yes Lendon Colonel, NP  fluticasone [facility]) 50 MCG/ACT nasal spray Place 2 sprays into both nostrils daily. Patient taking differently: Place 2 sprays into both nostrils as needed for allergies or rhinitis.  05/24/19  Yes Eugenie Filler, MD  gabapentin (NEURONTIN) 300 MG capsule Take 1 capsule (300 mg total) by  mouth 2 (two) times daily. 04/05/18  Yes Jessy Oto, MD  hydroxychloroquine (PLAQUENIL) 200 MG tablet Take 200 mg by mouth 2 (two) times daily.  08/03/19  Yes [provider]  insulin aspart (NOVOLOG) 100 UNIT/ML injection Inject 4 Units into the skin 2 (two) times daily as needed for high blood sugar.    Yes [provider]  insulin glargine (LANTUS) 100 UNIT/ML injection Inject 0.4 mLs (40 Units total) into the skin daily. Patient taking differently: Inject 40 Units into the skin 2 (two) times daily.  05/23/19 11/19/19 Yes Eugenie Filler, MD  ipratropium-albuterol (DUONEB) 0.5-2.5 (3) MG/3ML  SOLN Take 3 mLs by nebulization 3 (three) times daily. Use 3 times daily x4 days then every 6 hours as needed. Patient taking differently: Take 3 mLs by nebulization in the morning and at bedtime.  05/23/19  Yes Eugenie Filler, MD  LEVSIN/SL 0.125 MG SUBL Place 0.0625 mg under the tongue 2 (two) times daily as needed.  10/31/19  Yes [provider]  metoprolol tartrate (LOPRESSOR) 50 MG tablet Take 1 tablet (50 mg total) by mouth 2 (two) times daily. 08/06/19  Yes Lendon Colonel, NP  OXYGEN Inhale 2 L into the lungs daily as needed (shortness of breath).    Yes [provider]  pravastatin (PRAVACHOL) 40 MG tablet Take 1 tablet (40 mg total) by mouth at bedtime. Patient taking differently: Take 40 mg by mouth daily.  08/06/19  Yes Lendon Colonel, NP  SYMBICORT 160-4.5 MCG/ACT inhaler Inhale 2 puffs into the lungs 2 (two) times daily. Reported on 09/15/2015 04/22/13  Yes [provider]  allopurinol (ZYLOPRIM) 100 MG tablet Take 1 tablet (100 mg total) by mouth daily. Patient not taking: Reported on 11/19/2019 09/09/19   Jessy Oto, MD  colchicine (COLCRYS) 0.6 MG tablet Take 0.5 tablets (0.3 mg total) by mouth daily. Patient not taking: Reported on 11/04/2019 02/21/19 10/16/19  Arrien, Jimmy Picket, MD  furosemide (LASIX) 80 MG tablet Take 0.5 tablets (40  mg total) by mouth 2 (two) times daily. Patient not taking: Reported on 11/19/2019 08/06/19   Lendon Colonel, NP  hydrALAZINE (APRESOLINE) 50 MG tablet Take 50 mg in the morning and 100 mg at bedtime Patient not taking: Reported on 11/19/2019 08/06/19   Lendon Colonel, NP  lisinopril-hydrochlorothiazide (ZESTORETIC) 20-12.5 MG tablet Take 1 tablet by mouth daily. Patient not taking: Reported on 11/19/2019 08/06/19   Lendon Colonel, NP  loratadine (CLARITIN) 10 MG tablet Take 1 tablet (10 mg total) by mouth daily. Patient not taking: Reported on 11/04/2019 05/24/19   Eugenie Filler, MD  traMADol HCl 100 MG TABS Take 100 mg by mouth 2 (two) times daily as needed (for pain).  10/28/19   [provider]   Physical Exam: Vitals:   11/19/19 1944 11/19/19 2016 11/19/19 2130 11/19/19 2214  BP: (!) 168/77 (!) 184/86 (!) 166/83 (!) 139/98  Pulse: 71 70 75 73  Resp: (!) 23 (!) 22 (!) 22 (!) 22  Temp:      TempSrc:      SpO2: 100% 100% 100% 100%  Weight:      Height:        Wt Readings from Last 3 Encounters:  11/19/19 95.7 kg  10/16/19 95.7 kg  09/09/19 89.8 kg    General:  Appears calm and comfortable Eyes: PERRL, normal lids, irises & conjunctiva ENT: grossly normal hearing, lips & tongue Cardiovascular: RRR, no m/r/g but distant heart sounds. No LE edema. JVD very difficult to assess due to habitus. Telemetry: SR Respiratory: Rales at bases bilaterally. Normal respiratory effort. Abdomen: soft, ntnd Skin: no rash or induration seen on limited exam Musculoskeletal: grossly normal tone BUE/BLE Psychiatric: grossly normal mood and affect, speech fluent and appropriate Neurologic: grossly non-focal.          Labs on Admission:  Basic Metabolic Panel: Recent Labs  Lab 11/19/19 1618  NA 141  K 4.3  CL 103  CO2 27  GLUCOSE 158*  BUN 26*  CREATININE 1.47*  CALCIUM 9.5   Liver Function Tests: No results for input(s): AST, ALT,  ALKPHOS, BILITOT, PROT, ALBUMIN in  the last 168 hours. No results for input(s): LIPASE, AMYLASE in the last 168 hours. No results for input(s): AMMONIA in the last 168 hours. CBC: Recent Labs  Lab 11/19/19 1618  WBC 10.5  HGB 8.9*  HCT 31.2*  MCV 94.0  PLT 283   Cardiac Enzymes: No results for input(s): CKTOTAL, CKMB, CKMBINDEX, TROPONINI in the last 168 hours.  BNP (last 3 results) Recent Labs    02/20/19 0050 11/04/19 1742 11/19/19 1618  BNP 106.8* 211.7* 204.1*    ProBNP (last 3 results) No results for input(s): PROBNP in the last 8760 hours.  CBG: Recent Labs  Lab 11/19/19 2210  GLUCAP 124*    Radiological Exams on Admission: DG Chest 2 View  Result Date: 11/19/2019 CLINICAL DATA:  Chest pain, shortness of breath EXAM: CHEST - 2 VIEW COMPARISON:  Radiograph 11/04/2019, CT 04/19/2019 FINDINGS: Persistently low lung volumes with vascular crowding towards the bases. Guadalupe Dawn was, there is diffuse hazy interstitial opacity, vascular congestion and cephalization, and cardiomegaly which is slightly more pronounced than on comparison portable radiography. No pneumothorax or convincing effusion. The aorta is calcified. Widening of the right paratracheal stripe and convexity of the AP window is similar to comparisons and may correspond to the adenopathy seen on comparison CT. The remaining cardiomediastinal contours are unremarkable. No acute osseous or soft tissue abnormality. Degenerative changes are present in the imaged spine and shoulders. Telemetry leads overlie the chest. IMPRESSION: 1. Findings most suggestive of CHF/volume overload with cardiomegaly and pulmonary edema. 2. Persistently low lung volumes with vascular crowding towards the bases. 3. Widening of the right paratracheal stripe and convexity of the AP window may reflect adenopathy seen on comparison imaging. Electronically Signed   By: Lovena Le M.D.   On: 11/19/2019 16:08    EKG: Independently reviewed. NSR, RBBB and LAFB, TWI in III, aVF, and  V3-5, unchanged from ECG from prior admission.  Assessment/Plan Active Problems:   Chronic diastolic CHF (congestive heart failure) (HCC)   COPD (chronic obstructive pulmonary disease) (HCC)   Diabetes mellitus ([place])   Hypertension associated with diabetes ([place])   Systemic lupus erythematosus (HCC)   Chest pain   Acute on chronic respiratory failure with hypoxia (HCC)   Chronic kidney disease, stage 3b   CHF (congestive heart failure) (HCC)   Acute on chronic heart failure (HCC)  #Chest pain #Acute on chronic HFpEF #COPD on home O2 Patient presenting with chest pain in the setting of multiple medical comorbidities. Per discussion with cardiology by ED provider, given overall medical picture and currently on hospice as well as no longer having chest pain will not continue to trend troponins. Etiology of chest pain unclear, suspect demand in setting of volume overload and will IV diurese. Diuretic dosing also recently changed and decreased which may explain this event and findings on CXR. Possible thrombotic event but less likely and regardless intervention not in line with goals of care at present. Hypoxia driven event in setting of exertion and severe COPD also possible. - hold home bumex, consider increasing dose at time of discharg - IV lasix 40 mg - daily weights - strict I/O - trend BMP, Mg - replete lytes PRN - consider referral to home hospital program if available  #Hospice Patient expressing ambivalence towards being in hospice program, SW consult in AM.  #Known medical problems DM: reduce lantus 40>20u while admitted with aspart ISS HTN: cont diltiazem XL 300mg  BID, metop 50 BID HLD: cont pravastatin 40  COPD: cont home O2 6L, duoneb q6h PRN, symbicort>alternative while admitted Neuropathy: cont gabapentin 300mg  BID Lupus: cont hydroxychloroquine 200mg  BID CV: cont daily ASA Depression: cont celexa  Code Status: DNR/DNI, confirmed DVT Prophylaxis: Lovenox Family  Communication: [employer]give updated at bedside) Disposition Plan: Obs, likely d/c in AM  Time spent: 70 min  Clarnce Flock MD/MPH [facility]

## 2019-11-19 NOTE — ED Triage Notes (Signed)
Pt was at hair salon, had sudden onset of CP with diaphoresis, clammy, dizziness. Initial BP 72/42, CBG 245. Pt has home health nurse that was with her, says pt just started 2 diuretic meds. Pt described chest wall pain, pain with palpation. EMS gave 150 NS, pressures came up slowly during transport and pt said she was starting to feel better. EKG showed R bundle block with PACs, pt on 6L O2 [place] at home. Pt had similar episode yesterday per home health nurse

## 2019-11-19 NOTE — ED Provider Notes (Signed)
[facility] EMERGENCY DEPARTMENT Provider Note   CSN: [id number] Arrival date & time: 11/19/19  1450     History Chief Complaint  Patient presents with  . Chest Pain    [name] [name] is a 79 y.o. female with history of CHF, COPD, diabetes mellitus, GERD, lupus, obesity, PE, OSA presents for evaluation of acute onset, gradually improving episode of lightheadedness, diaphoresis, chest pain.  Symptoms began around 1 PM after walking to a chair in a hair salon.  Reports sudden onset lightheadedness, nausea, and diaphoresis and states that shortly thereafter she developed central throbbing chest pains.  The pain does not radiate.  No aggravating or alleviating factors noted.  Did feel a little short of breath with this as well.  She is on 6 L/min via nasal cannula at baseline.  She states that the last time that she felt similarly was around 2 weeks ago while she was sitting in her home.  Her home health aide who is at the bedside notes that she recently had 2 medication adjustments by palliative care, 1 of which was an increase in her Bumex dose.  The patient states that her lower extremity edema is baseline today.  Her home health aide states that her weight has been holding steady.  Was noted to be hypotensive on EMS arrival, improved with administration of 150 cc normal saline bolus.  She reports that she is feeling better at this time.  She does tell me that today she has not urinated as much as she typically does.   The history is provided by the patient.       Past Medical History:  Diagnosis Date  . Allergic rhinitis   . Anxiety   . Arthritis   . Asthma   . Cancer [facility]) 2006   breast cancer right  . CHF (congestive heart failure) (HCC)   . COPD (chronic obstructive pulmonary disease) (HCC)   . Degenerative disc disease, lumbar   . Diabetes mellitus without complication (HCC)   . Dyspnea    walking distances  . Eczema   . GERD (gastroesophageal reflux disease)     . Glaucoma   . History of home oxygen therapy    uses 2 liters ay night and prn  . Hyperlipidemia   . Hypertension   . Low back pain   . Lupus (HCC)    skin  . Neck pain   . Numbness and tingling   . Obesity   . Osteopenia   . Pulmonary embolism (HCC) 01/2012    CT showed multi small PE and coumadized   . Sleep apnea 2010   no cpap used  . Systemic lupus erythematosus (HCC)     Patient Active Problem List   Diagnosis Date Noted  . Acute on chronic heart failure (HCC) 11/19/2019  . CHF (congestive heart failure) (HCC) 11/05/2019  . Essential hypertension   . Hyperlipidemia   . Chronic kidney disease, stage 3b   . Fall 05/21/2019  . COPD exacerbation (HCC) 05/21/2019  . Pressure injury of skin 02/21/2019  . Acute respiratory disease due to COVID-19 virus 02/16/2019  . Acute on chronic respiratory failure with hypoxia (HCC) 02/15/2019  . Acute-on-chronic kidney injury (HCC) 02/15/2019  . Sepsis (HCC) 02/15/2019  . Obesity (BMI 30-39.9) 10/31/2018  . CRI (chronic renal insufficiency), stage 3 (moderate) 10/31/2018  . Iron deficiency anemia 04/10/2018  . 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 Abnormality of gait 08/05/2015  . Spinal stenosis of lumbar  region 08/05/2015  . Peripheral neuropathy 08/05/2015  . Chest pain 12/28/2013  . Hyperlipidemia associated with type 2 diabetes mellitus ([place]) 04/05/2013  . Systemic lupus erythematosus ([place]) 04/05/2013  . Pulmonary embolism ([place]) 01/24/2012  . Chronic diastolic CHF (congestive heart failure) ([place]) 01/24/2012  . COPD (chronic obstructive pulmonary disease) ([place]) 01/24/2012  . Diabetes mellitus ([place]) 01/24/2012  . Hypertension associated with diabetes ([place]) 01/24/2012  . Leukocytosis 01/24/2012  . Bronchitis 01/24/2012  . Anemia 01/24/2012    Past Surgical History:  Procedure Laterality Date  . ABDOMINAL HYSTERECTOMY  1976  . BACK SURGERY  2006   lower  . BREAST BIOPSY Right   . BREAST EXCISIONAL BIOPSY  Left   . BREAST LUMPECTOMY Right   . COLONOSCOPY WITH PROPOFOL N/A 03/23/2015   Procedure: COLONOSCOPY WITH PROPOFOL;  Surgeon: Laurence Spates, MD;  Location: [facility];  Service: Endoscopy;  Laterality: N/A;  . Dobtamine myoview  05/02/2008   EF 67% ; LV norm  . DOPPLER ECHOCARDIOGRAPHY  01/25/2012   EF 55 TO 60%; LV norm.  . EXPLORATORY LAPAROTOMY    . EYE SURGERY Bilateral 2010   lens reaplcments for cataracts   . Lower Extrem. venous doppler  01/25/2012    neg.  . TOE SURGERY  1996   Bunion     OB History   No obstetric history on file.     Family History  Problem Relation Age of Onset  . Heart failure Father   . Heart failure Mother   . Diabetes Brother   . Breast cancer Maternal Aunt   . Breast cancer Paternal Aunt     Social History   Tobacco Use  . Smoking status: Former Research scientist (life sciences)  . Smokeless tobacco: Never Used  . Tobacco comment: Quit 1980  Vaping Use  . Vaping Use: Never used  Substance Use Topics  . Alcohol use: No    Alcohol/week: 0.0 standard drinks  . Drug use: No    Home Medications Prior to Admission medications   Medication Sig Start Date End Date Taking? Authorizing Provider  acetaminophen (TYLENOL) 500 MG tablet Take 500 mg by mouth every 6 (six) hours as needed for mild pain or moderate pain.   Yes [provider]  aspirin EC 81 MG tablet Take 81 mg by mouth daily.   Yes [provider]  bumetanide (BUMEX) 0.5 MG tablet Take 0.5 mg by mouth 2 (two) times daily.   Yes [provider]  cetirizine (ZYRTEC) 10 MG tablet Take 10 mg by mouth daily.   Yes [provider]  citalopram (CELEXA) 10 MG tablet Take 10 mg by mouth daily.    Yes [provider]  desonide (DESOWEN) 0.05 % cream Apply 1 application topically 2 (two) times daily. Apply to face   Yes [provider]  diclofenac sodium (VOLTAREN) 1 % GEL Apply 2 g topically daily as needed (leg pain).    Yes [provider]    diltiazem (CARTIA XT) 300 MG 24 hr capsule Take 1 capsule (300 mg total) by mouth daily. Reported on 09/15/2015 Patient taking differently: Take 300 mg by mouth in the morning and at bedtime.  08/06/19  Yes Lendon Colonel, NP  fluticasone [facility]) 50 MCG/ACT nasal spray Place 2 sprays into both nostrils daily. Patient taking differently: Place 2 sprays into both nostrils as needed for allergies or rhinitis.  05/24/19  Yes Eugenie Filler, MD  gabapentin (NEURONTIN) 300 MG capsule Take 1 capsule (300 mg total) by  mouth 2 (two) times daily. 04/05/18  Yes Jessy Oto, MD  hydroxychloroquine (PLAQUENIL) 200 MG tablet Take 200 mg by mouth 2 (two) times daily.  08/03/19  Yes [provider]  insulin aspart (NOVOLOG) 100 UNIT/ML injection Inject 4 Units into the skin 2 (two) times daily as needed for high blood sugar.    Yes [provider]  insulin glargine (LANTUS) 100 UNIT/ML injection Inject 0.4 mLs (40 Units total) into the skin daily. Patient taking differently: Inject 40 Units into the skin 2 (two) times daily.  05/23/19 11/19/19 Yes Eugenie Filler, MD  ipratropium-albuterol (DUONEB) 0.5-2.5 (3) MG/3ML SOLN Take 3 mLs by nebulization 3 (three) times daily. Use 3 times daily x4 days then every 6 hours as needed. Patient taking differently: Take 3 mLs by nebulization in the morning and at bedtime.  05/23/19  Yes Eugenie Filler, MD  LEVSIN/SL 0.125 MG SUBL Place 0.0625 mg under the tongue 2 (two) times daily as needed.  10/31/19  Yes [provider]  metoprolol tartrate (LOPRESSOR) 50 MG tablet Take 1 tablet (50 mg total) by mouth 2 (two) times daily. 08/06/19  Yes Lendon Colonel, NP  OXYGEN Inhale 2 L into the lungs daily as needed (shortness of breath).    Yes [provider]  pravastatin (PRAVACHOL) 40 MG tablet Take 1 tablet (40 mg total) by mouth at bedtime. Patient taking differently: Take 40 mg by mouth daily.  08/06/19  Yes Lendon Colonel, NP   SYMBICORT 160-4.5 MCG/ACT inhaler Inhale 2 puffs into the lungs 2 (two) times daily. Reported on 09/15/2015 04/22/13  Yes [provider]  allopurinol (ZYLOPRIM) 100 MG tablet Take 1 tablet (100 mg total) by mouth daily. Patient not taking: Reported on 11/19/2019 09/09/19   Jessy Oto, MD  colchicine (COLCRYS) 0.6 MG tablet Take 0.5 tablets (0.3 mg total) by mouth daily. Patient not taking: Reported on 11/04/2019 02/21/19 10/16/19  Arrien, Jimmy Picket, MD  furosemide (LASIX) 80 MG tablet Take 0.5 tablets (40 mg total) by mouth 2 (two) times daily. Patient not taking: Reported on 11/19/2019 08/06/19   Lendon Colonel, NP  hydrALAZINE (APRESOLINE) 50 MG tablet Take 50 mg in the morning and 100 mg at bedtime Patient not taking: Reported on 11/19/2019 08/06/19   Lendon Colonel, NP  lisinopril-hydrochlorothiazide (ZESTORETIC) 20-12.5 MG tablet Take 1 tablet by mouth daily. Patient not taking: Reported on 11/19/2019 08/06/19   Lendon Colonel, NP  loratadine (CLARITIN) 10 MG tablet Take 1 tablet (10 mg total) by mouth daily. Patient not taking: Reported on 11/04/2019 05/24/19   Eugenie Filler, MD  traMADol HCl 100 MG TABS Take 100 mg by mouth 2 (two) times daily as needed (for pain).  10/28/19   [provider]    Allergies    Penicillins, Fentanyl, Peach [prunus persica], and Shellfish allergy  Review of Systems   Review of Systems  Constitutional: Positive for diaphoresis. Negative for chills and fever.  Respiratory: Positive for shortness of breath.   Cardiovascular: Positive for chest pain and leg swelling (chronic, unchanged).  Gastrointestinal: Positive for nausea. Negative for vomiting.  Neurological: Positive for light-headedness. Negative for syncope.  All other systems reviewed and are negative.   Physical Exam Updated Vital Signs BP (!) 139/98   Pulse 73   Temp 97.9 F (36.6 C) (Oral)   Resp (!) 22   Ht '5\' 4"'$  (1.626 m)   Wt 95.7 kg   SpO2 100%   BMI  36.21 kg/m   Physical Exam Vitals and nursing note reviewed.  Constitutional:      General: She is not in acute distress.    Appearance: She is well-developed. She is obese.  HENT:     Head: Normocephalic and atraumatic.  Eyes:     General:        Right eye: No discharge.        Left eye: No discharge.     Conjunctiva/sclera: Conjunctivae normal.  Neck:     Vascular: No JVD.     Trachea: No tracheal deviation.  Cardiovascular:     Rate and Rhythm: Normal rate and regular rhythm.  Pulmonary:     Effort: Pulmonary effort is normal.     Breath sounds: Examination of the right-middle field reveals rales. Examination of the left-middle field reveals rales. Examination of the right-lower field reveals rales. Examination of the left-lower field reveals rales. Rales present.     Comments: Speaking in short phrases on 6LPM via [place] Abdominal:     General: There is no distension.     Palpations: Abdomen is soft.     Tenderness: There is no guarding or rebound.  Musculoskeletal:     Cervical back: Normal range of motion and neck supple.     Right lower leg: No tenderness. Edema present.     Left lower leg: No tenderness. Edema present.  Skin:    General: Skin is warm.     Findings: No erythema.  Neurological:     Mental Status: She is alert.  Psychiatric:        Behavior: Behavior normal.     ED Results / Procedures / Treatments   Labs (all labs ordered are listed, but only abnormal results are displayed) Labs Reviewed  BASIC METABOLIC PANEL - Abnormal; Notable for the following components:      Result Value   Glucose, Bld 158 (*)    BUN 26 (*)    Creatinine, Ser 1.47 (*)    GFR calc non Af Amer 34 (*)    GFR calc Af Amer 39 (*)    All other components within normal limits  CBC - Abnormal; Notable for the following components:   RBC 3.32 (*)    Hemoglobin 8.9 (*)    HCT 31.2 (*)    MCHC 28.5 (*)    RDW 16.6 (*)    All other components within normal limits  BRAIN  NATRIURETIC PEPTIDE - Abnormal; Notable for the following components:   B Natriuretic Peptide 204.1 (*)    All other components within normal limits  CBG MONITORING, ED - Abnormal; Notable for the following components:   Glucose-Capillary 124 (*)    All other components within normal limits  TROPONIN I (HIGH SENSITIVITY) - Abnormal; Notable for the following components:   Troponin I (High Sensitivity) 47 (*)    All other components within normal limits  TROPONIN I (HIGH SENSITIVITY) - Abnormal; Notable for the following components:   Troponin I (High Sensitivity) 196 (*)    All other components within normal limits  SARS CORONAVIRUS 2 BY RT PCR [facility] ORDER, [place] LAB)  BASIC METABOLIC PANEL  CBC  MAGNESIUM    EKG EKG Interpretation  Date/Time:  Tuesday November 19 2019 15:27:03 EDT Ventricular Rate:  84 PR Interval:    QRS Duration: 134 QT Interval:  402 QTC Calculation: 476 R Axis:   -90 Text Interpretation: Sinus rhythm RBBB and LAFB No significant change since last  tracing Confirmed by Blanchie Dessert [id number]) on 11/19/2019 3:30:20 PM   Radiology DG Chest 2 View  Result Date: 11/19/2019 CLINICAL DATA:  Chest pain, shortness of breath EXAM: CHEST - 2 VIEW COMPARISON:  Radiograph 11/04/2019, CT 04/19/2019 FINDINGS: Persistently low lung volumes with vascular crowding towards the bases. [place] Dawn was, there is diffuse hazy interstitial opacity, vascular congestion and cephalization, and cardiomegaly which is slightly more pronounced than on comparison portable radiography. No pneumothorax or convincing effusion. The aorta is calcified. Widening of the right paratracheal stripe and convexity of the AP window is similar to comparisons and may correspond to the adenopathy seen on comparison CT. The remaining cardiomediastinal contours are unremarkable. No acute osseous or soft tissue abnormality. Degenerative changes are present in the imaged spine and shoulders.  Telemetry leads overlie the chest. IMPRESSION: 1. Findings most suggestive of CHF/volume overload with cardiomegaly and pulmonary edema. 2. Persistently low lung volumes with vascular crowding towards the bases. 3. Widening of the right paratracheal stripe and convexity of the AP window may reflect adenopathy seen on comparison imaging. Electronically Signed   By: Lovena Le M.D.   On: 11/19/2019 16:08    Procedures Procedures (including critical care time)  Medications Ordered in ED Medications  aspirin EC tablet 81 mg (81 mg Oral Given 11/19/19 2219)  hydroxychloroquine (PLAQUENIL) tablet 200 mg (200 mg Oral Given 11/19/19 2252)  diltiazem (CARDIZEM CD) 24 hr capsule 300 mg (has no administration in time range)  metoprolol tartrate (LOPRESSOR) tablet 50 mg (50 mg Oral Not Given 11/19/19 2218)  pravastatin (PRAVACHOL) tablet 40 mg (40 mg Oral Given 11/19/19 2218)  citalopram (CELEXA) tablet 10 mg (has no administration in time range)  insulin glargine (LANTUS) injection 20 Units (has no administration in time range)  gabapentin (NEURONTIN) capsule 300 mg (300 mg Oral Given 11/19/19 2218)  ipratropium-albuterol (DUONEB) 0.5-2.5 (3) MG/3ML nebulizer solution 3 mL (has no administration in time range)  mometasone-formoterol (DULERA) 200-5 MCG/ACT inhaler 2 puff (2 puffs Inhalation Given 11/19/19 2252)  insulin aspart (novoLOG) injection 0-9 Units (1 Units Subcutaneous Given 11/19/19 2219)  enoxaparin (LOVENOX) injection 40 mg (has no administration in time range)  acetaminophen (TYLENOL) tablet 650 mg (has no administration in time range)    Or  acetaminophen (TYLENOL) suppository 650 mg (has no administration in time range)  traZODone (DESYREL) tablet 50 mg (50 mg Oral Given 11/19/19 2218)  polyethylene glycol (MIRALAX / GLYCOLAX) packet 17 g (has no administration in time range)  ondansetron (ZOFRAN) tablet 4 mg (has no administration in time range)    Or  ondansetron (ZOFRAN) injection 4 mg (has no  administration in time range)  aspirin chewable tablet 324 mg (324 mg Oral Given 11/19/19 1627)  metoprolol tartrate (LOPRESSOR) tablet 50 mg (50 mg Oral Given 11/19/19 2035)  diltiazem (CARDIZEM CD) 24 hr capsule 300 mg (300 mg Oral Given 11/19/19 2110)  furosemide (LASIX) injection 40 mg (40 mg Intravenous Given 11/19/19 2220)    ED Course  I have reviewed the triage vital signs and the nursing notes.  Pertinent labs & imaging results that were available during my care of the patient were reviewed by me and considered in my medical decision making (see chart for details).    MDM Rules/Calculators/A&P                          Patient presenting for evaluation of sudden onset substernal chest pains with associated diaphoresis, lightheadedness, shortness of breath, nausea  that began with ambulation.  Was noted to be hypertensive on EMS arrival with improvement upon administration of small fluid bolus.  In the ED she is asymptomatic from a chest pain standpoint but states that she is feeling "better but not 100%".  Her EKG is unchanged, shows no acute ischemic abnormalities.  Lab work reviewed and interpreted by myself shows no leukocytosis, stable anemia, renal insufficiency a little improved from baseline.  No metabolic derangements.  Serial troponins are abnormal and uptrending.  Concern for NSTEMI in the setting of exertional chest pain.  We will consult cardiology for further recommendations.  Patient remains asymptomatic and resting comfortably in bed.  8:45PM CONSULT: Spoke with Dr. Kalman Shan with cardiology who reviewed patient's work-up.  He advises that given that she is in hospice care that she would likely not be a good candidate for catheterization or further intervention.  He recommends holding on any further troponin trending if she remains asymptomatic.  From a cardiac standpoint the patient would likely benefit from further diaphoresis as her chest x-ray is concerning for CHF/volume overload  with cardiomegaly and worsening pulmonary edema.  Her BNP is elevated today, slightly worse from baseline.   Spoke with Dr. Dione Plover with [place] who has evaluated the patient emergently in the ED and agrees to assume care of patient and bring her in to the hospital for further evaluation and management.   Final Clinical Impression(s) / ED Diagnoses Final diagnoses:  Acute on chronic congestive heart failure, unspecified heart failure type ([place])  Elevated troponin    Rx / DC Orders ED Discharge Orders    None       Debroah Baller 11/19/19 2322    Blanchie Dessert, MD 11/20/19 2319

## 2019-11-20 DIAGNOSIS — R079 Chest pain, unspecified: Secondary | ICD-10-CM | POA: Diagnosis not present

## 2019-11-20 DIAGNOSIS — I5033 Acute on chronic diastolic (congestive) heart failure: Secondary | ICD-10-CM

## 2019-11-20 DIAGNOSIS — J9621 Acute and chronic respiratory failure with hypoxia: Secondary | ICD-10-CM | POA: Diagnosis not present

## 2019-11-20 DIAGNOSIS — M329 Systemic lupus erythematosus, unspecified: Secondary | ICD-10-CM | POA: Diagnosis not present

## 2019-11-20 LAB — CBC
HCT: 27.7 % — ABNORMAL LOW (ref 36.0–46.0)
Hemoglobin: 8.1 g/dL — ABNORMAL LOW (ref 12.0–15.0)
MCH: 27.1 pg (ref 26.0–34.0)
MCHC: 29.2 g/dL — ABNORMAL LOW (ref 30.0–36.0)
MCV: 92.6 fL (ref 80.0–100.0)
Platelets: 314 10*3/uL (ref 150–400)
RBC: 2.99 MIL/uL — ABNORMAL LOW (ref 3.87–5.11)
RDW: 16.7 % — ABNORMAL HIGH (ref 11.5–15.5)
WBC: 7 10*3/uL (ref 4.0–10.5)
nRBC: 0 % (ref 0.0–0.2)

## 2019-11-20 LAB — BASIC METABOLIC PANEL
Anion gap: 8 (ref 5–15)
BUN: 28 mg/dL — ABNORMAL HIGH (ref 8–23)
CO2: 32 mmol/L (ref 22–32)
Calcium: 9.6 mg/dL (ref 8.9–10.3)
Chloride: 102 mmol/L (ref 98–111)
Creatinine, Ser: 1.54 mg/dL — ABNORMAL HIGH (ref 0.44–1.00)
GFR calc Af Amer: 37 mL/min — ABNORMAL LOW (ref >60)
GFR calc non Af Amer: 32 mL/min — ABNORMAL LOW (ref >60)
Glucose, Bld: 117 mg/dL — ABNORMAL HIGH (ref 70–99)
Potassium: 4.2 mmol/L (ref 3.5–5.1)
Sodium: 142 mmol/L (ref 135–145)

## 2019-11-20 LAB — GLUCOSE, CAPILLARY
Glucose-Capillary: 161 mg/dL — ABNORMAL HIGH (ref 70–99)
Glucose-Capillary: 138 mg/dL — ABNORMAL HIGH (ref 70–99)
Glucose-Capillary: 95 mg/dL (ref 70–99)
Glucose-Capillary: 131 mg/dL — ABNORMAL HIGH (ref 70–99)
Glucose-Capillary: 206 mg/dL — ABNORMAL HIGH (ref 70–99)
Glucose-Capillary: 190 mg/dL — ABNORMAL HIGH (ref 70–99)

## 2019-11-20 LAB — MAGNESIUM: Magnesium: 1.8 mg/dL (ref 1.7–2.4)

## 2019-11-20 MED ORDER — BUMETANIDE 0.5 MG PO TABS
0.5000 mg | ORAL_TABLET | Freq: Two times a day (BID) | ORAL | Status: DC
  Administered 2019-11-20 – 2019-11-21 (×2): 0.5 mg via ORAL
  Filled 2019-11-20 (×3): qty 1

## 2019-11-20 NOTE — Progress Notes (Signed)
Patient arrived to unit [name] [name] bed 17 from emergency department. Assisted patient to bed by nursing staff. Patient alert and oriented denies pain or discomfort at present time.Oriented patient to nursing unit and call bell system.Educated patient to call for help prior to getting out of bed and verbalized understanding.Patient personal belongings ,call bell, phone within reach.Bed alarm set for safety.

## 2019-11-20 NOTE — Progress Notes (Signed)
PROGRESS NOTE    [name] [name]  TDD:[id number] DOB: 11/29/1940 DOA: 11/19/2019 PCP: Nolene Ebbs, MD   Brief Narrative:   [name] [name] is a 79 y.o. female with history of CHF, COPD on 6L home O2, DM2, lupus, hx PE, OSA, presents after episode of chest pain.   8/4: Chest pain is resolved. She denies dyspnea. She reports that she doesn't understand why she's on hospice. Have attempted calls to family, but have not been able to reach them. TOC/PC consulted. Unsafe d/c plan d/t lack of clarity on her hospice status.    Assessment & Plan: Chest pain Acute on chronic HFpEF COPD on home O2     - Patient presenting with chest pain in the setting of multiple medical comorbidities.      - Per discussion with cardiology by ED provider, given overall medical picture and currently on hospice as well as no longer having chest pain will not continue to trend troponins.      - received IV lasix 40mg  yesterday; reports breathing improved and CP is resolved     - resume home bumex     - now on 5L  and doing well     - dulera     - follow I&O, daily wt  Hospice     - Patient expressing ambivalence towards being in hospice program     - made multiple attempts to contact family to determine why she is on hospice as she says her granddaughter placed her on it     - TOC consulted; PC consulted  DMt2     - lantus, SSI, DM diet     - A1c: 7.0  HTN     - diltiazem, metoprolol  HLD     - pravastatin Neuropathy     - gabapentin  Lupus     - hydroxychloroquine CV     - ASA  Depression     - celexa  DVT prophylaxis: lovenox Code Status: DNR Family Communication: Attempted calls to [employer], only received VM. Status is: Observation  The patient remains OBS appropriate and will d/c before 2 midnights.  Dispo: The patient is from: Home              Anticipated d/c is to: Home              Anticipated d/c date is: 1 day              Patient currently is medically stable to  d/c.  ROS:  Denies CP, N, V, dyspnea . Remainder ROS is negative for all not previously mentioned.  Subjective: "They just want you to die on that program."  Objective: Vitals:   11/20/19 0749 11/20/19 0900 11/20/19 1020 11/20/19 1215  BP: (!) 141/67  139/61 (!) 145/66  Pulse: (!) 59 62 64 60  Resp: 18 18 16 18   Temp: 98 F (36.7 C)   97.9 F (36.6 C)  TempSrc: Oral   Oral  SpO2: 100% 100% 100% 95%  Weight:      Height:        Intake/Output Summary (Last 24 hours) at 11/20/2019 1735 Last data filed at 11/20/2019 6237 Gross per 24 hour  Intake --  Output 850 ml  Net -850 ml   Filed Weights   11/19/19 1513 11/20/19 0135  Weight: 95.7 kg 98.5 kg    Examination:  General: 79 y.o. female resting in bed in NAD Cardiovascular: RRR, +S1, S2, no m/g/r, equal  pulses throughout Respiratory: CTABL, no w/r/r, normal WOB GI: BS+, NDNT, no masses noted, no organomegaly noted MSK: No e/c/c Neuro: alert to name, follows commands Psyc: Appropriate interaction and affect, calm/cooperative   Data Reviewed: I have personally reviewed following labs and imaging studies.  CBC: Recent Labs  Lab 11/19/19 1618 11/20/19 0626  WBC 10.5 7.0  HGB 8.9* 8.1*  HCT 31.2* 27.7*  MCV 94.0 92.6  PLT 283 734   Basic Metabolic Panel: Recent Labs  Lab 11/19/19 1618 11/20/19 0626  NA 141 142  K 4.3 4.2  CL 103 102  CO2 27 32  GLUCOSE 158* 117*  BUN 26* 28*  CREATININE 1.47* 1.54*  CALCIUM 9.5 9.6  MG  --  1.8   GFR: Estimated Creatinine Clearance: 34.3 mL/min (A) (by C-G formula based on SCr of 1.54 mg/dL (H)). Liver Function Tests: No results for input(s): AST, ALT, ALKPHOS, BILITOT, PROT, ALBUMIN in the last 168 hours. No results for input(s): LIPASE, AMYLASE in the last 168 hours. No results for input(s): AMMONIA in the last 168 hours. Coagulation Profile: No results for input(s): INR, PROTIME in the last 168 hours. Cardiac Enzymes: No results for input(s): CKTOTAL, CKMB,  CKMBINDEX, TROPONINI in the last 168 hours. BNP (last 3 results) No results for input(s): PROBNP in the last 8760 hours. HbA1C: No results for input(s): HGBA1C in the last 72 hours. CBG: Recent Labs  Lab 11/20/19 0139 11/20/19 0428 11/20/19 0751 11/20/19 1214 11/20/19 1619  GLUCAP 161* 138* 95 131* 206*   Lipid Profile: No results for input(s): CHOL, HDL, LDLCALC, TRIG, CHOLHDL, LDLDIRECT in the last 72 hours. Thyroid Function Tests: No results for input(s): TSH, T4TOTAL, FREET4, T3FREE, THYROIDAB in the last 72 hours. Anemia Panel: No results for input(s): VITAMINB12, FOLATE, FERRITIN, TIBC, IRON, RETICCTPCT in the last 72 hours. Sepsis Labs: No results for input(s): PROCALCITON, LATICACIDVEN in the last 168 hours.  Recent Results (from the past 240 hour(s))  SARS Coronavirus 2 by RT PCR (hospital order, performed in [facility] hospital lab) Nasopharyngeal Nasopharyngeal Swab     Status: None   Collection Time: 11/19/19  8:17 PM   Specimen: Nasopharyngeal Swab  Result Value Ref Range Status   SARS Coronavirus 2 NEGATIVE NEGATIVE Final    Comment: (NOTE) SARS-CoV-2 target nucleic acids are NOT DETECTED.  The SARS-CoV-2 RNA is generally detectable in upper and lower respiratory specimens during the acute phase of infection. The lowest concentration of SARS-CoV-2 viral copies this assay can detect is 250 copies / mL. A negative result does not preclude SARS-CoV-2 infection and should not be used as the sole basis for treatment or other patient management decisions.  A negative result may occur with improper specimen collection / handling, submission of specimen other than nasopharyngeal swab, presence of viral mutation(s) within the areas targeted by this assay, and inadequate number of viral copies (<250 copies / mL). A negative result must be combined with clinical observations, patient history, and epidemiological information.  Fact Sheet for Patients:    StrictlyIdeas.no  Fact Sheet for Healthcare Providers: BankingDealers.co.za  This test is not yet approved or  cleared by the [place] FDA and has been authorized for detection and/or diagnosis of SARS-CoV-2 by FDA under an Emergency Use Authorization (EUA).  This EUA will remain in effect (meaning this test can be used) for the duration of the COVID-19 declaration under Section 564(b)(1) of the Act, 21 U.S.C. section 360bbb-3(b)(1), unless the authorization is terminated or revoked sooner.  Performed at  [facility], [place] [address] [place], [place] [address]       Radiology Studies: DG Chest 2 View  Result Date: 11/19/2019 CLINICAL DATA:  Chest pain, shortness of breath EXAM: CHEST - 2 VIEW COMPARISON:  Radiograph 11/04/2019, CT 04/19/2019 FINDINGS: Persistently low lung volumes with vascular crowding towards the bases. Guadalupe Dawn was, there is diffuse hazy interstitial opacity, vascular congestion and cephalization, and cardiomegaly which is slightly more pronounced than on comparison portable radiography. No pneumothorax or convincing effusion. The aorta is calcified. Widening of the right paratracheal stripe and convexity of the AP window is similar to comparisons and may correspond to the adenopathy seen on comparison CT. The remaining cardiomediastinal contours are unremarkable. No acute osseous or soft tissue abnormality. Degenerative changes are present in the imaged spine and shoulders. Telemetry leads overlie the chest. IMPRESSION: 1. Findings most suggestive of CHF/volume overload with cardiomegaly and pulmonary edema. 2. Persistently low lung volumes with vascular crowding towards the bases. 3. Widening of the right paratracheal stripe and convexity of the AP window may reflect adenopathy seen on comparison imaging. Electronically Signed   By: Lovena Le M.D.   On: 11/19/2019 16:08     Scheduled Meds: . aspirin EC  81 mg  Oral Daily  . bumetanide  0.5 mg Oral BID  . citalopram  10 mg Oral Daily  . diltiazem  300 mg Oral Daily  . enoxaparin (LOVENOX) injection  40 mg Subcutaneous Q24H  . gabapentin  300 mg Oral BID  . hydroxychloroquine  200 mg Oral BID  . insulin aspart  0-9 Units Subcutaneous Q4H  . insulin glargine  20 Units Subcutaneous Daily  . metoprolol tartrate  50 mg Oral BID  . mometasone-formoterol  2 puff Inhalation BID  . pravastatin  40 mg Oral Daily   Continuous Infusions:   LOS: 0 days    Time spent: 25 minutes spent in the coordination of care today.    Jonnie Finner, DO [facility]  If 7PM-7AM, please contact night-coverage [email or web address] 11/20/2019, 5:35 PM

## 2019-11-21 DIAGNOSIS — E1142 Type 2 diabetes mellitus with diabetic polyneuropathy: Secondary | ICD-10-CM

## 2019-11-21 DIAGNOSIS — R079 Chest pain, unspecified: Secondary | ICD-10-CM | POA: Diagnosis not present

## 2019-11-21 DIAGNOSIS — Z794 Long term (current) use of insulin: Secondary | ICD-10-CM

## 2019-11-21 DIAGNOSIS — M329 Systemic lupus erythematosus, unspecified: Secondary | ICD-10-CM | POA: Diagnosis not present

## 2019-11-21 DIAGNOSIS — I5033 Acute on chronic diastolic (congestive) heart failure: Secondary | ICD-10-CM | POA: Diagnosis not present

## 2019-11-21 LAB — GLUCOSE, CAPILLARY
Glucose-Capillary: 130 mg/dL — ABNORMAL HIGH (ref 70–99)
Glucose-Capillary: 159 mg/dL — ABNORMAL HIGH (ref 70–99)
Glucose-Capillary: 177 mg/dL — ABNORMAL HIGH (ref 70–99)
Glucose-Capillary: 178 mg/dL — ABNORMAL HIGH (ref 70–99)
Glucose-Capillary: 181 mg/dL — ABNORMAL HIGH (ref 70–99)

## 2019-11-21 MED ORDER — INSULIN GLARGINE 100 UNIT/ML ~~LOC~~ SOLN: 40.0000 [IU] | Freq: Every day | SUBCUTANEOUS | 0 refills | Status: AC

## 2019-11-21 MED ORDER — DILTIAZEM HCL ER COATED BEADS 300 MG PO CP24: 300.0000 mg | ORAL_CAPSULE | Freq: Every day | ORAL | 3 refills | Status: AC

## 2019-11-21 NOTE — Evaluation (Signed)
Physical Therapy Evaluation Patient Details Name: [name] [name] MRN: [id number] DOB: 06/20/1940 Today's Date: 11/21/2019   History of Present Illness  Pt is a 79 y.o. F with significant PMH of CHF, COPD on 6L home O2, DM2, PE, lupus, who presents after an episode of chest pain, now resolved.   Clinical Impression  Prior to admission, pt lives alone, has assist for ADL's from a daily caregiver, and uses a walker vs cane. On PT evaluation, pt presents fairly close to her functional baseline. Requiring moderate assist for transfers (has lift chair at home) and ambulating 70 feet with a walker at a min guard assist level. HR peak 113 bpm, SpO2 97% on 6L O2. Recommend follow up HHPT to address deficits and maximize functional mobility.     Follow Up Recommendations Home health PT;Supervision for mobility/OOB    Equipment Recommendations  None recommended by PT (well equipped)   Recommendations for Other Services       Precautions / Restrictions Precautions Precautions: Fall Restrictions Weight Bearing Restrictions: No      Mobility  Bed Mobility Overal bed mobility: Needs Assistance Bed Mobility: Supine to Sit     Supine to sit: Mod assist     General bed mobility comments: ModA for supine > sit, assist for RLE and trunk to upright  Transfers Overall transfer level: Needs assistance Equipment used: Rolling walker (2 wheeled) Transfers: Sit to/from Stand Sit to Stand: Mod assist         General transfer comment: ModA to rise to stand, use of momentum  Ambulation/Gait Ambulation/Gait assistance: Min guard Gait Distance (Feet): 70 Feet Assistive device: Rolling walker (2 wheeled) Gait Pattern/deviations: Step-through pattern;Decreased stride length Gait velocity: decreased   General Gait Details: Slow and steady pace, chair follow utilized, min guard for safety. Cues for walker proximity  Stairs            Wheelchair Mobility    Modified Rankin (Stroke  Patients Only)       Balance Overall balance assessment: Needs assistance Sitting-balance support: Feet supported Sitting balance-Leahy Scale: Good     Standing balance support: Bilateral upper extremity supported Standing balance-Leahy Scale: Poor                               Pertinent Vitals/Pain Pain Assessment: No/denies pain    Home Living Family/patient expects to be discharged to:: Private residence Living Arrangements: Alone Available Help at Discharge: Personal care attendant Type of Home: Apartment Home Access: Level entry     Home Layout: One level Home Equipment: Walker - 2 wheels;Shower seat;Wheelchair - manual;Cane - single point;Hospital bed;Bedside commode Additional Comments: Caregiver daily 8-6    Prior Function Level of Independence: Needs assistance   Gait / Transfers Assistance Needed: uses walker vs cane, household ambulator  ADL's / Homemaking Assistance Needed: aide assists with adls        Hand Dominance        Extremity/Trunk Assessment   Upper Extremity Assessment Upper Extremity Assessment: Defer to OT evaluation    Lower Extremity Assessment Lower Extremity Assessment: Generalized weakness       Communication   Communication: No difficulties  Cognition Arousal/Alertness: Awake/alert Behavior During Therapy: WFL for tasks assessed/performed Overall Cognitive Status: Within Functional Limits for tasks assessed  General Comments      Exercises     Assessment/Plan    PT Assessment Patient needs continued PT services  PT Problem List Decreased strength;Decreased activity tolerance;Decreased balance;Decreased mobility       PT Treatment Interventions DME instruction;Gait training;Functional mobility training;Therapeutic activities;Therapeutic exercise;Balance training;Patient/family education    PT Goals (Current goals can be found in the Care Plan  section)  Acute Rehab PT Goals Patient Stated Goal: did not state; agreeable to therapy PT Goal Formulation: With patient Time For Goal Achievement: 12/05/19 Potential to Achieve Goals: Fair    Frequency Min 3X/week   Barriers to discharge        Co-evaluation               AM-PAC PT "6 Clicks" Mobility  Outcome Measure Help needed turning from your back to your side while in a flat bed without using bedrails?: None Help needed moving from lying on your back to sitting on the side of a flat bed without using bedrails?: A Lot Help needed moving to and from a bed to a chair (including a wheelchair)?: A Little Help needed standing up from a chair using your arms (e.g., wheelchair or bedside chair)?: A Lot Help needed to walk in hospital room?: A Little Help needed climbing 3-5 steps with a railing? : A Lot 6 Click Score: 16    End of Session Equipment Utilized During Treatment: Gait belt;Oxygen Activity Tolerance: Patient tolerated treatment well Patient left: in chair;with call bell/phone within reach;with chair alarm set Nurse Communication: Mobility status PT Visit Diagnosis: Difficulty in walking, not elsewhere classified (R26.2);Unsteadiness on feet (R26.81)    Time: 1287-8676 PT Time Calculation (min) (ACUTE ONLY): 22 min   Charges:   PT Evaluation $PT Eval Moderate Complexity: 1 Mod            Wyona Almas, PT, DPT Acute Rehabilitation Services Pager [phone] Office [phone]   Deno Etienne 11/21/2019, 1:08 PM

## 2019-11-21 NOTE — Progress Notes (Signed)
PMT consult received and chart reviewed. Spoke with RN and RN CM. Prior to admission, patient enrolled in [facility] hospice services. At this time, patient is not interested in continuing hospice services on discharge. RN CM has arranged home health services and contacted Sheyenne Konz with [facility] of patient's decision to revoke hospice services. Possible discharge home today.   NO CHARGE  Ihor Dow, [place], FNP-C Palliative Medicine Team  Phone: [phone] Fax: [phone]

## 2019-11-21 NOTE — TOC Transition Note (Addendum)
Transition of Care [facility]) - CM/SW Discharge Note   Patient Details  Name: [name] [name] MRN: [id number] Date of Birth: 30-Sep-1940  [facility] CM/SW Contact:  Zenon Mayo, RN Phone Number: 11/21/2019, 10:14 AM   Clinical Narrative:    Patient is for possible dc today, she has an Therapist, nutritional from 8 am to 6 pm everyday, who helps her with everything, the aide will transport her home at dc and bring her oxygen tank also.  NCM offered patient choice for [facility] for CHF disease management.  She does not have a preference, NCM made referral to [place] with [facility], she is able to take referral.  Will need HHRN order.  Anderson Malta the aide states the grand daughter's phone is [phone] but is goes straight to vm, NCM lefrt message for her to return call regarding patient.  Paitent states she does not want Home Hospice any longer.  NCM contacted Tim with [facility] and informed him of this information, he states he will contact the grand daughter , Maudie Mercury also, because she will have to sign some paper work.    Final next level of care: [place] Barriers to Discharge: No Barriers Identified   Patient Goals and CMS Choice Patient states their goals for this hospitalization and ongoing recovery are:: go home CMS Medicare.gov Compare Post Acute Care list provided to:: Patient Choice offered to / list presented to : Patient  Discharge Placement                       Discharge Plan and Services                  DME Agency: NA       HH Arranged: RN, Disease Management, PT, OT HH Agency: [facility] [employer] Date [place]: 11/21/19 Time [place]: 1014 Representative spoke with at [place]: [place] ([place]) Interventions     Readmission Risk Interventions No flowsheet data found.

## 2019-11-21 NOTE — Discharge Summary (Signed)
Physician Discharge Summary  [name] [name] ZRA:[id number] DOB: 02-Jan-1941 DOA: 11/19/2019  PCP: [name] Ebbs, MD  Admit date: 11/19/2019 Discharge date: 11/21/2019  Admitted From: Home Disposition:  Discharged to home.   Recommendations for Outpatient Follow-up:  1. Follow up with PCP in 1 week 2. Please obtain BMP/CBC in one week  Discharge Condition: Stable  CODE STATUS: DNR   Brief/Interim Summary: [name] N Greenis a 79 y.o.femalewith history of CHF, COPD on 6L home O2, DM2, lupus, hx PE, OSA, presents after episode of chest pain.   8/5: Denies complaints this AM. No acute events ON. Has home health aide from [place] daily. Attempted contact with family but have been unsuccessful. Not much else can be done here. PT recs HHPT.   Discharge Diagnoses:  Chest pain Acute on chronic HFpEF COPD on home O2 (6L)     - Patient presenting with chest pain in the setting of multiple medical comorbidities.      - Per discussion with cardiology by ED provider, given overall medical picture and currently on hospice as well as no longer having chest pain will not continue to trend troponins.      - received IV lasix 40mg  yesterday; reports breathing improved and CP is resolved     - resume home bumex     - now on 5L [place] and doing well     - dulera     - follow I&O, daily wt     - 8/5: No acute events ON. Denies CP/dyspnea. [place] for discharge to home.   Hospice     - Patient expressing ambivalence towards being in hospice program     - made multiple attempts to contact family to determine why she is on hospice as she says her granddaughter placed her on it     - 8/5: Has home health aide from [place] daily. Attempted contact with family but have been unsuccessful. Hospice company has been contacted by CM. They are attempting to contact granddaughter to update paperwork.   DMt2     - lantus, SSI, DM diet     - A1c: 7.0     - 8/5: continue home regimen at discharge  HTN     - diltiazem,  metoprolol     - 8/5: continue home regimen at discharge  HLD     - pravastatin     - 8/5: continue home regimen at discharge  Neuropathy Debility     - gabapentin     - 8/5: continue home regimen at discharge; PT rec HHPT at discharge.  Lupus     - hydroxychloroquine     - 8/5: continue home regimen at discharge  CV     - ASA     - 8/5: continue home regimen at discharge  Depression     - celexa     - 8/5: continue home regimen at discharge  CKD 3b     - baseline SCr 1.6 - 1.9     - SCr is 1.54 today; follow up outpt  Discharge Instructions   Allergies as of 11/21/2019      Reactions   Penicillins Swelling   Has patient had a PCN reaction causing immediate rash, facial/tongue/throat swelling, SOB or lightheadedness with hypotension: Yes Has patient had a PCN reaction causing severe rash involving mucus membranes or skin necrosis: Yes Has patient had a PCN reaction that required hospitalization Yes Has patient had a PCN reaction occurring within the  last 10 years: No, more than 10 yrs ago If all of the above answers are "NO", then may proceed with Cephalosporin use.   Fentanyl Itching   Peach [prunus Persica] Hives   Shellfish Allergy Hives      Medication List    STOP taking these medications   allopurinol 100 MG tablet Commonly known as: ZYLOPRIM   colchicine 0.6 MG tablet Commonly known as: Colcrys   furosemide 80 MG tablet Commonly known as: LASIX   hydrALAZINE 50 MG tablet Commonly known as: APRESOLINE   lisinopril-hydrochlorothiazide 20-12.5 MG tablet Commonly known as: ZESTORETIC   loratadine 10 MG tablet Commonly known as: CLARITIN   traMADol HCl 100 MG Tabs     TAKE these medications   acetaminophen 500 MG tablet Commonly known as: TYLENOL Take 500 mg by mouth every 6 (six) hours as needed for mild pain or moderate pain.   aspirin EC 81 MG tablet Take 81 mg by mouth daily.   bumetanide 0.5 MG tablet Commonly known as: BUMEX Take  0.5 mg by mouth 2 (two) times daily.   cetirizine 10 MG tablet Commonly known as: ZYRTEC Take 10 mg by mouth daily.   citalopram 10 MG tablet Commonly known as: CELEXA Take 10 mg by mouth daily.   desonide 0.05 % cream Commonly known as: DESOWEN Apply 1 application topically 2 (two) times daily. Apply to face   diclofenac sodium 1 % Gel Commonly known as: VOLTAREN Apply 2 g topically daily as needed (leg pain).   diltiazem 300 MG 24 hr capsule Commonly known as: Cartia XT Take 1 capsule (300 mg total) by mouth daily. Reported on 09/15/2015 What changed:   when to take this  additional instructions   fluticasone 50 MCG/ACT nasal spray Commonly known as: FLONASE Place 2 sprays into both nostrils daily. What changed:   when to take this  reasons to take this   gabapentin 300 MG capsule Commonly known as: NEURONTIN Take 1 capsule (300 mg total) by mouth 2 (two) times daily.   hydroxychloroquine 200 MG tablet Commonly known as: PLAQUENIL Take 200 mg by mouth 2 (two) times daily.   insulin aspart 100 UNIT/ML injection Commonly known as: novoLOG Inject 4 Units into the skin 2 (two) times daily as needed for high blood sugar.   insulin glargine 100 UNIT/ML injection Commonly known as: LANTUS Inject 0.4 mLs (40 Units total) into the skin daily. What changed: when to take this   ipratropium-albuterol 0.5-2.5 (3) MG/3ML Soln Commonly known as: DUONEB Take 3 mLs by nebulization 3 (three) times daily. Use 3 times daily x4 days then every 6 hours as needed. What changed:   when to take this  additional instructions   Levsin/SL 0.125 MG Subl Generic drug: Hyoscyamine Sulfate SL Place 0.0625 mg under the tongue 2 (two) times daily as needed.   metoprolol tartrate 50 MG tablet Commonly known as: LOPRESSOR Take 1 tablet (50 mg total) by mouth 2 (two) times daily.   OXYGEN Inhale 2 L into the lungs daily as needed (shortness of breath).   pravastatin 40 MG  tablet Commonly known as: PRAVACHOL Take 1 tablet (40 mg total) by mouth at bedtime. What changed: when to take this   Symbicort 160-4.5 MCG/ACT inhaler Generic drug: budesonide-formoterol Inhale 2 puffs into the lungs 2 (two) times daily. Reported on 09/15/2015       Follow-up Information    Home, [facility] Follow up.   Specialty: Home Health Services Why: [place] information:  [address] [place] [address] [phone]              Allergies  Allergen Reactions  . Penicillins Swelling    Has patient had a PCN reaction causing immediate rash, facial/tongue/throat swelling, SOB or lightheadedness with hypotension: Yes Has patient had a PCN reaction causing severe rash involving mucus membranes or skin necrosis: Yes Has patient had a PCN reaction that required hospitalization Yes Has patient had a PCN reaction occurring within the last 10 years: No, more than 10 yrs ago If all of the above answers are "NO", then may proceed with Cephalosporin use.   . Fentanyl Itching  . Peach [Prunus Persica] Hives  . Shellfish Allergy Hives    Wound Care: Pressure Injury 02/21/19 Buttocks Right;Left Stage II -  Partial thickness loss of dermis presenting as a shallow open ulcer with a red, pink wound bed without slough. (Active)  02/21/19 0805  Location: Buttocks  Location Orientation: Right;Left  Staging: Stage II -  Partial thickness loss of dermis presenting as a shallow open ulcer with a red, pink wound bed without slough.  Wound Description (Comments):   Present on Admission:     Procedures/Studies: DG Chest 2 View  Result Date: 11/19/2019 CLINICAL DATA:  Chest pain, shortness of breath EXAM: CHEST - 2 VIEW COMPARISON:  Radiograph 11/04/2019, CT 04/19/2019 FINDINGS: Persistently low lung volumes with vascular crowding towards the bases. Guadalupe Dawn was, there is diffuse hazy interstitial opacity, vascular congestion and cephalization, and cardiomegaly which is  slightly more pronounced than on comparison portable radiography. No pneumothorax or convincing effusion. The aorta is calcified. Widening of the right paratracheal stripe and convexity of the AP window is similar to comparisons and may correspond to the adenopathy seen on comparison CT. The remaining cardiomediastinal contours are unremarkable. No acute osseous or soft tissue abnormality. Degenerative changes are present in the imaged spine and shoulders. Telemetry leads overlie the chest. IMPRESSION: 1. Findings most suggestive of CHF/volume overload with cardiomegaly and pulmonary edema. 2. Persistently low lung volumes with vascular crowding towards the bases. 3. Widening of the right paratracheal stripe and convexity of the AP window may reflect adenopathy seen on comparison imaging. Electronically Signed   By: Lovena Le M.D.   On: 11/19/2019 16:08   DG Chest 2 View  Result Date: 11/04/2019 CLINICAL DATA:  Wheezing EXAM: CHEST - 2 VIEW COMPARISON:  05/21/2019, 02/15/2019 FINDINGS: No pleural effusion or focal consolidation. Mild cardiomegaly with vascular congestion. Mild diffuse interstitial and hazy pulmonary opacity. Aortic atherosclerosis. No pneumothorax. IMPRESSION: Cardiomegaly with vascular congestion and mild diffuse interstitial and hazy pulmonary opacity, suspect for mild pulmonary edema. Electronically Signed   By: Donavan Foil M.D.   On: 11/04/2019 19:07     Subjective: Denies complaints this AM.   Discharge Exam: Vitals:   11/21/19 0747 11/21/19 0942  BP: 123/80   Pulse: 63   Resp: 20   Temp: (!) 97.5 F (36.4 C)   SpO2: 96% 98%   Vitals:   11/21/19 0031 11/21/19 0359 11/21/19 0747 11/21/19 0942  BP: (!) 137/55 (!) 167/76 123/80   Pulse: 68 70 63   Resp: 17 (!) 22 20   Temp: 99.4 F (37.4 C) 99.3 F (37.4 C) (!) 97.5 F (36.4 C)   TempSrc: Oral Oral Oral   SpO2: 95% 92% 96% 98%  Weight: 96.2 kg     Height:        General: 79 y.o. female resting in bed in  NAD Cardiovascular: RRR, +S1, S2, no m/g/r, equal pulses throughout Respiratory: CTABL, no w/r/r, normal WOB on 5L [facility] GI: BS+, NDNT, no masses noted, no organomegaly noted MSK: No e/c/c Neuro: Alert to name, follows commands Psyc: Appropriate interaction and affect, calm/cooperative   The results of significant diagnostics from this hospitalization (including imaging, microbiology, ancillary and laboratory) are listed below for reference.     Microbiology: Recent Results (from the past 240 hour(s))  SARS Coronavirus 2 by RT PCR (hospital order, performed in [facility] hospital lab) Nasopharyngeal Nasopharyngeal Swab     Status: None   Collection Time: 11/19/19  8:17 PM   Specimen: Nasopharyngeal Swab  Result Value Ref Range Status   SARS Coronavirus 2 NEGATIVE NEGATIVE Final    Comment: (NOTE) SARS-CoV-2 target nucleic acids are NOT DETECTED.  The SARS-CoV-2 RNA is generally detectable in upper and lower respiratory specimens during the acute phase of infection. The lowest concentration of SARS-CoV-2 viral copies this assay can detect is 250 copies / mL. A negative result does not preclude SARS-CoV-2 infection and should not be used as the sole basis for treatment or other patient management decisions.  A negative result may occur with improper specimen collection / handling, submission of specimen other than nasopharyngeal swab, presence of viral mutation(s) within the areas targeted by this assay, and inadequate number of viral copies (<250 copies / mL). A negative result must be combined with clinical observations, patient history, and epidemiological information.  Fact Sheet for Patients:   StrictlyIdeas.no  Fact Sheet for Healthcare Providers: BankingDealers.co.za  This test is not yet approved or  cleared by the [place] FDA and has been authorized for detection and/or diagnosis of SARS-CoV-2 by FDA under an Emergency  Use Authorization (EUA).  This EUA will remain in effect (meaning this test can be used) for the duration of the COVID-19 declaration under Section 564(b)(1) of the Act, 21 U.S.C. section 360bbb-3(b)(1), unless the authorization is terminated or revoked sooner.  Performed at [facility], [place] [address] [place], [place] [address]      Labs: BNP (last 3 results) Recent Labs    02/20/19 0050 11/04/19 1742 11/19/19 1618  BNP 106.8* 211.7* 315.1*   Basic Metabolic Panel: Recent Labs  Lab 11/19/19 1618 11/20/19 0626  NA 141 142  K 4.3 4.2  CL 103 102  CO2 27 32  GLUCOSE 158* 117*  BUN 26* 28*  CREATININE 1.47* 1.54*  CALCIUM 9.5 9.6  MG  --  1.8   Liver Function Tests: No results for input(s): AST, ALT, ALKPHOS, BILITOT, PROT, ALBUMIN in the last 168 hours. No results for input(s): LIPASE, AMYLASE in the last 168 hours. No results for input(s): AMMONIA in the last 168 hours. CBC: Recent Labs  Lab 11/19/19 1618 11/20/19 0626  WBC 10.5 7.0  HGB 8.9* 8.1*  HCT 31.2* 27.7*  MCV 94.0 92.6  PLT 283 314   Cardiac Enzymes: No results for input(s): CKTOTAL, CKMB, CKMBINDEX, TROPONINI in the last 168 hours. BNP: Invalid input(s): POCBNP CBG: Recent Labs  Lab 11/20/19 1619 11/20/19 2022 11/21/19 0033 11/21/19 0358 11/21/19 0745  GLUCAP 206* 190* 178* 177* 130*   D-Dimer No results for input(s): DDIMER in the last 72 hours. Hgb A1c No results for input(s): HGBA1C in the last 72 hours. Lipid Profile No results for input(s): CHOL, HDL, LDLCALC, TRIG, CHOLHDL, LDLDIRECT in the last 72 hours. Thyroid function studies No results for input(s): TSH, T4TOTAL, T3FREE, THYROIDAB in the last 72  hours.  Invalid input(s): FREET3 Anemia work up No results for input(s): VITAMINB12, FOLATE, FERRITIN, TIBC, IRON, RETICCTPCT in the last 72 hours. Urinalysis    Component Value Date/Time   COLORURINE STRAW (A) 05/21/2019 1147   APPEARANCEUR CLEAR 05/21/2019 1147    LABSPEC 1.009 05/21/2019 1147   PHURINE 8.0 05/21/2019 1147   GLUCOSEU NEGATIVE 05/21/2019 1147   HGBUR NEGATIVE 05/21/2019 1147   BILIRUBINUR NEGATIVE 05/21/2019 1147   KETONESUR NEGATIVE 05/21/2019 1147   PROTEINUR 30 (A) 05/21/2019 1147   UROBILINOGEN 0.2 02/04/2015 1547   NITRITE NEGATIVE 05/21/2019 1147   LEUKOCYTESUR NEGATIVE 05/21/2019 1147   Sepsis Labs Invalid input(s): PROCALCITONIN,  WBC,  LACTICIDVEN Microbiology Recent Results (from the past 240 hour(s))  SARS Coronavirus 2 by RT PCR (hospital order, performed in [place] hospital lab) Nasopharyngeal Nasopharyngeal Swab     Status: None   Collection Time: 11/19/19  8:17 PM   Specimen: Nasopharyngeal Swab  Result Value Ref Range Status   SARS Coronavirus 2 NEGATIVE NEGATIVE Final    Comment: (NOTE) SARS-CoV-2 target nucleic acids are NOT DETECTED.  The SARS-CoV-2 RNA is generally detectable in upper and lower respiratory specimens during the acute phase of infection. The lowest concentration of SARS-CoV-2 viral copies this assay can detect is 250 copies / mL. A negative result does not preclude SARS-CoV-2 infection and should not be used as the sole basis for treatment or other patient management decisions.  A negative result may occur with improper specimen collection / handling, submission of specimen other than nasopharyngeal swab, presence of viral mutation(s) within the areas targeted by this assay, and inadequate number of viral copies (<250 copies / mL). A negative result must be combined with clinical observations, patient history, and epidemiological information.  Fact Sheet for Patients:   StrictlyIdeas.no  Fact Sheet for Healthcare Providers: BankingDealers.co.za  This test is not yet approved or  cleared by the [place] FDA and has been authorized for detection and/or diagnosis of SARS-CoV-2 by FDA under an Emergency Use Authorization (EUA).  This  EUA will remain in effect (meaning this test can be used) for the duration of the COVID-19 declaration under Section 564(b)(1) of the Act, 21 U.S.C. section 360bbb-3(b)(1), unless the authorization is terminated or revoked sooner.  Performed at [facility], [place] [address] [place], [place] [address]      Time coordinating discharge: 35 minutes  SIGNED:   Jonnie Finner, DO  [facility] 11/21/2019, 11:29 AM   If 7PM-7AM, please contact night-coverage [email or web address]

## 2019-11-21 NOTE — Evaluation (Addendum)
Occupational Therapy Evaluation Patient Details Name: [name] [name] MRN: [id number] DOB: [birth year] Today's Date: 11/21/2019    History of Present Illness Pt is a 79 y.o. F with significant PMH of CHF, COPD on 6L home O2, DM2, PE, lupus, who presents after an episode of chest pain, now resolved.    Clinical Impression   PTA pt reports using a cane or RW for mobility and getting assistance from [facility] from 9am-5pm daily that assists with ADLs and IADLs. Pt reports that she only needs to take her meds and go to the bathroom between the hours of 5pm-9am. Requires Supervision - Max A for ADLs due to weakness, decreased activity tolerance, and decreased balance. Pt required Max A from sit to stand from recliner and mod A from toilet needing verbal cues for "nose over toes" to lean forward to assist with transition from sit to stand. Pt reports it is easier for her at home because she has grab bars on both sides of toilet. Pt with good safety awareness with walker and requiring Min guard for mobility. Believe pt would benefit from skilled OT services acutely and at the [facility] level to increase safety and independence with ADLs when HHAs are unable to assist at home.   *This evaluation was conducted under a supervising OTR/L who assisted in evaluation and documentation.    Follow Up Recommendations  Home health OT;Supervision/Assistance - 24 hour    Equipment Recommendations  None recommended by OT       Precautions / Restrictions Precautions Precautions: Fall Restrictions Weight Bearing Restrictions: No      Mobility Bed Mobility        General bed mobility comments: pt sitting up in recliner upon arrival  Transfers Overall transfer level: Needs assistance Equipment used: Rolling walker (2 wheeled) Transfers: Sit to/from Stand Sit to Stand: Mod assist;Max assist         General transfer comment: Max A from recliner and Mod A from toilet needing verbal cues for "nose over toes" to lean  forward to assist with transition from sit<>stand.     Balance Overall balance assessment: Needs assistance Sitting-balance support: Feet supported Sitting balance-Leahy Scale: Good     Standing balance support: Bilateral upper extremity supported Standing balance-Leahy Scale: Poor                             ADL either performed or assessed with clinical judgement   ADL Overall ADL's : Needs assistance/impaired     Grooming: Wash/dry hands;Min guard;Cueing for sequencing;Standing Grooming Details (indicate cue type and reason): min guard. Pt using elbows to bear weight and balance at sink Upper Body Bathing: Maximal assistance;Sitting   Lower Body Bathing: Total assistance;Sitting/lateral leans;Sit to/from stand   Upper Body Dressing : Maximal assistance;Sitting   Lower Body Dressing: Total assistance;Sitting/lateral leans;Sit to/from stand   Toilet Transfer: Moderate assistance;Grab bars;BSC;Regular Toilet;Cueing for safety;Cueing for sequencing Toilet Transfer Details (indicate cue type and reason): Mod A to rise from toilet pt using grab bars and reports it is easier at home with grab bars on bilateral sides of toilet to push forward. Toileting- Clothing Manipulation and Hygiene: Supervision/safety;Sitting/lateral lean Toileting - Clothing Manipulation Details (indicate cue type and reason): pt able to complete perineal care with supervision     Functional mobility during ADLs: Maximal assistance;+2 for physical assistance;+2 for safety/equipment;Rolling walker General ADL Comments: Pt with weakness, decreased activity tolerance, and decrease balance.  Pertinent Vitals/Pain Pain Assessment: No/denies pain        Extremity/Trunk Assessment Upper Extremity Assessment Upper Extremity Assessment: Generalized weakness   Lower Extremity Assessment Lower Extremity Assessment: Defer to PT evaluation       Communication  Communication Communication: No difficulties   Cognition Arousal/Alertness: Awake/alert Behavior During Therapy: WFL for tasks assessed/performed Overall Cognitive Status: Within Functional Limits for tasks assessed                                 General Comments: Pt with slow processing but oriented and aware of situation. Pt would like to maintain level of independence and be safe in the home; grab bars - toilet; lift chair/recliner   General Comments  Pt reporting normally she is able to go to the bathroom by herself from lift chair and HHA assists with meds and leaves them on a table next to lift chair.             Home Living Family/patient expects to be discharged to:: Private residence Living Arrangements: Alone Available Help at Discharge: Personal care attendant Type of Home: Apartment Home Access: Level entry     [place]: One level     Bathroom Shower/Tub: Teacher, early years/pre: [place]: Environmental consultant - 2 wheels;Shower seat;Wheelchair - manual;Cane - single point;Hospital bed;Bedside commode   Additional Comments: Caregiver daily 9-5      Prior Functioning/Environment Level of Independence: Needs assistance  Gait / Transfers Assistance Needed: pt reports using both walker and cane but utilizes walker more since HHA says it is safer ADL's / Homemaking Assistance Needed: aide assists with adls and iadls            OT Problem List: Decreased strength;Decreased activity tolerance;Impaired balance (sitting and/or standing);Decreased knowledge of precautions;Cardiopulmonary status limiting activity;Obesity      OT Treatment/Interventions: Self-care/ADL training;Therapeutic exercise;Energy conservation;DME and/or AE instruction;Therapeutic activities;Patient/family education;Balance training    OT Goals(Current goals can be found in the care plan section) Acute Rehab OT Goals Patient Stated Goal: go home OT Goal  Formulation: With patient Time For Goal Achievement: 12/05/19 Potential to Achieve Goals: Good  OT Frequency: Min 2X/week    AM-PAC OT "6 Clicks" Daily Activity     Outcome Measure Help from another person eating meals?: None Help from another person taking care of personal grooming?: A Little Help from another person toileting, which includes using toliet, bedpan, or urinal?: A Lot Help from another person bathing (including washing, rinsing, drying)?: A Lot Help from another person to put on and taking off regular upper body clothing?: A Lot Help from another person to put on and taking off regular lower body clothing?: Total 6 Click Score: 14   End of Session Equipment Utilized During Treatment: Gait belt;Rolling walker Nurse Communication: Mobility status  Activity Tolerance: Patient tolerated treatment well Patient left: in chair;with call bell/phone within reach;with chair alarm set  OT Visit Diagnosis: Unsteadiness on feet (R26.81);Other abnormalities of gait and mobility (R26.89);Muscle weakness (generalized) (M62.81)                Time: 8850-2774 OT Time Calculation (min): 21 min Charges:  OT General Charges $OT Visit: 1 Visit OT Evaluation $OT Eval Moderate Complexity: 1 Mod  Fontaine Hehl/OTS   Halston Kintz 11/21/2019, 1:31 PM

## 2019-11-21 NOTE — Progress Notes (Signed)
D/C instructions given and reviewed. No questions voiced. Tele and IV removed. Tolerated well. Notified personal aide via phone that pt was ready for D/C, she stated she would arrive 1530-1600.

## 2019-12-06 ENCOUNTER — Other Ambulatory Visit: Payer: Self-pay

## 2019-12-06 ENCOUNTER — Inpatient Hospital Stay (HOSPITAL_COMMUNITY)
Admission: EM | Admit: 2019-12-06 | Discharge: 2019-12-18 | DRG: 208 | Disposition: E | Payer: Medicare Other | Attending: Pulmonary Disease | Admitting: Pulmonary Disease

## 2019-12-06 ENCOUNTER — Emergency Department (HOSPITAL_COMMUNITY): Payer: Medicare Other

## 2019-12-06 ENCOUNTER — Encounter (HOSPITAL_COMMUNITY): Payer: Self-pay | Admitting: Emergency Medicine

## 2019-12-06 ENCOUNTER — Other Ambulatory Visit (HOSPITAL_COMMUNITY): Payer: Medicare HMO

## 2019-12-06 ENCOUNTER — Inpatient Hospital Stay (HOSPITAL_COMMUNITY): Payer: Medicare Other

## 2019-12-06 DIAGNOSIS — Z9841 Cataract extraction status, right eye: Secondary | ICD-10-CM

## 2019-12-06 DIAGNOSIS — N1832 Chronic kidney disease, stage 3b: Secondary | ICD-10-CM | POA: Diagnosis present

## 2019-12-06 DIAGNOSIS — I468 Cardiac arrest due to other underlying condition: Secondary | ICD-10-CM | POA: Diagnosis present

## 2019-12-06 DIAGNOSIS — Z86711 Personal history of pulmonary embolism: Secondary | ICD-10-CM | POA: Diagnosis not present

## 2019-12-06 DIAGNOSIS — J9692 Respiratory failure, unspecified with hypercapnia: Secondary | ICD-10-CM | POA: Diagnosis present

## 2019-12-06 DIAGNOSIS — E785 Hyperlipidemia, unspecified: Secondary | ICD-10-CM | POA: Diagnosis present

## 2019-12-06 DIAGNOSIS — Z803 Family history of malignant neoplasm of breast: Secondary | ICD-10-CM | POA: Diagnosis not present

## 2019-12-06 DIAGNOSIS — I469 Cardiac arrest, cause unspecified: Secondary | ICD-10-CM | POA: Diagnosis not present

## 2019-12-06 DIAGNOSIS — I5033 Acute on chronic diastolic (congestive) heart failure: Secondary | ICD-10-CM | POA: Diagnosis present

## 2019-12-06 DIAGNOSIS — I130 Hypertensive heart and chronic kidney disease with heart failure and stage 1 through stage 4 chronic kidney disease, or unspecified chronic kidney disease: Secondary | ICD-10-CM | POA: Diagnosis present

## 2019-12-06 DIAGNOSIS — I4510 Unspecified right bundle-branch block: Secondary | ICD-10-CM | POA: Diagnosis present

## 2019-12-06 DIAGNOSIS — Z20822 Contact with and (suspected) exposure to covid-19: Secondary | ICD-10-CM | POA: Diagnosis present

## 2019-12-06 DIAGNOSIS — Z794 Long term (current) use of insulin: Secondary | ICD-10-CM

## 2019-12-06 DIAGNOSIS — E1142 Type 2 diabetes mellitus with diabetic polyneuropathy: Secondary | ICD-10-CM | POA: Diagnosis present

## 2019-12-06 DIAGNOSIS — K219 Gastro-esophageal reflux disease without esophagitis: Secondary | ICD-10-CM | POA: Diagnosis present

## 2019-12-06 DIAGNOSIS — J9690 Respiratory failure, unspecified, unspecified whether with hypoxia or hypercapnia: Secondary | ICD-10-CM | POA: Diagnosis present

## 2019-12-06 DIAGNOSIS — Z853 Personal history of malignant neoplasm of breast: Secondary | ICD-10-CM | POA: Diagnosis not present

## 2019-12-06 DIAGNOSIS — I129 Hypertensive chronic kidney disease with stage 1 through stage 4 chronic kidney disease, or unspecified chronic kidney disease: Secondary | ICD-10-CM | POA: Diagnosis present

## 2019-12-06 DIAGNOSIS — C9000 Multiple myeloma not having achieved remission: Secondary | ICD-10-CM | POA: Diagnosis present

## 2019-12-06 DIAGNOSIS — M1990 Unspecified osteoarthritis, unspecified site: Secondary | ICD-10-CM | POA: Diagnosis present

## 2019-12-06 DIAGNOSIS — Z8249 Family history of ischemic heart disease and other diseases of the circulatory system: Secondary | ICD-10-CM

## 2019-12-06 DIAGNOSIS — G4730 Sleep apnea, unspecified: Secondary | ICD-10-CM | POA: Diagnosis present

## 2019-12-06 DIAGNOSIS — Z9911 Dependence on respirator [ventilator] status: Secondary | ICD-10-CM | POA: Diagnosis not present

## 2019-12-06 DIAGNOSIS — J9600 Acute respiratory failure, unspecified whether with hypoxia or hypercapnia: Secondary | ICD-10-CM

## 2019-12-06 DIAGNOSIS — Z90710 Acquired absence of both cervix and uterus: Secondary | ICD-10-CM

## 2019-12-06 DIAGNOSIS — Z91013 Allergy to seafood: Secondary | ICD-10-CM

## 2019-12-06 DIAGNOSIS — Z6836 Body mass index (BMI) 36.0-36.9, adult: Secondary | ICD-10-CM | POA: Diagnosis not present

## 2019-12-06 DIAGNOSIS — Z833 Family history of diabetes mellitus: Secondary | ICD-10-CM

## 2019-12-06 DIAGNOSIS — Z8616 Personal history of COVID-19: Secondary | ICD-10-CM

## 2019-12-06 DIAGNOSIS — Z9842 Cataract extraction status, left eye: Secondary | ICD-10-CM

## 2019-12-06 DIAGNOSIS — J9621 Acute and chronic respiratory failure with hypoxia: Principal | ICD-10-CM | POA: Diagnosis present

## 2019-12-06 DIAGNOSIS — Z7951 Long term (current) use of inhaled steroids: Secondary | ICD-10-CM

## 2019-12-06 DIAGNOSIS — J9601 Acute respiratory failure with hypoxia: Secondary | ICD-10-CM

## 2019-12-06 DIAGNOSIS — E1122 Type 2 diabetes mellitus with diabetic chronic kidney disease: Secondary | ICD-10-CM | POA: Diagnosis present

## 2019-12-06 DIAGNOSIS — J449 Chronic obstructive pulmonary disease, unspecified: Secondary | ICD-10-CM | POA: Diagnosis present

## 2019-12-06 DIAGNOSIS — I878 Other specified disorders of veins: Secondary | ICD-10-CM | POA: Diagnosis present

## 2019-12-06 DIAGNOSIS — Z7982 Long term (current) use of aspirin: Secondary | ICD-10-CM

## 2019-12-06 DIAGNOSIS — Z9981 Dependence on supplemental oxygen: Secondary | ICD-10-CM

## 2019-12-06 DIAGNOSIS — R4182 Altered mental status, unspecified: Secondary | ICD-10-CM | POA: Diagnosis present

## 2019-12-06 DIAGNOSIS — H409 Unspecified glaucoma: Secondary | ICD-10-CM | POA: Diagnosis present

## 2019-12-06 DIAGNOSIS — M8580 Other specified disorders of bone density and structure, unspecified site: Secondary | ICD-10-CM | POA: Diagnosis present

## 2019-12-06 DIAGNOSIS — Z961 Presence of intraocular lens: Secondary | ICD-10-CM | POA: Diagnosis present

## 2019-12-06 DIAGNOSIS — Z66 Do not resuscitate: Secondary | ICD-10-CM | POA: Diagnosis present

## 2019-12-06 DIAGNOSIS — Z87891 Personal history of nicotine dependence: Secondary | ICD-10-CM

## 2019-12-06 DIAGNOSIS — Z880 Allergy status to penicillin: Secondary | ICD-10-CM

## 2019-12-06 DIAGNOSIS — D631 Anemia in chronic kidney disease: Secondary | ICD-10-CM | POA: Diagnosis present

## 2019-12-06 DIAGNOSIS — I454 Nonspecific intraventricular block: Secondary | ICD-10-CM | POA: Diagnosis present

## 2019-12-06 DIAGNOSIS — Z79899 Other long term (current) drug therapy: Secondary | ICD-10-CM

## 2019-12-06 DIAGNOSIS — J9602 Acute respiratory failure with hypercapnia: Secondary | ICD-10-CM | POA: Diagnosis present

## 2019-12-06 DIAGNOSIS — M329 Systemic lupus erythematosus, unspecified: Secondary | ICD-10-CM | POA: Diagnosis present

## 2019-12-06 DIAGNOSIS — Z885 Allergy status to narcotic agent status: Secondary | ICD-10-CM

## 2019-12-06 DIAGNOSIS — Z91018 Allergy to other foods: Secondary | ICD-10-CM

## 2019-12-06 LAB — I-STAT ARTERIAL BLOOD GAS, ED
Acid-Base Excess: 1 mmol/L (ref 0.0–2.0)
Acid-Base Excess: 3 mmol/L — ABNORMAL HIGH (ref 0.0–2.0)
Acid-base deficit: 5 mmol/L — ABNORMAL HIGH (ref 0.0–2.0)
Bicarbonate: 29.3 mmol/L — ABNORMAL HIGH (ref 20.0–28.0)
Bicarbonate: 29.9 mmol/L — ABNORMAL HIGH (ref 20.0–28.0)
Bicarbonate: 24.9 mmol/L (ref 20.0–28.0)
Calcium, Ion: 1.31 mmol/L (ref 1.15–1.40)
Calcium, Ion: 1.3 mmol/L (ref 1.15–1.40)
Calcium, Ion: 1.29 mmol/L (ref 1.15–1.40)
HCT: 30 % — ABNORMAL LOW (ref 36.0–46.0)
HCT: 29 % — ABNORMAL LOW (ref 36.0–46.0)
HCT: 31 % — ABNORMAL LOW (ref 36.0–46.0)
Hemoglobin: 10.2 g/dL — ABNORMAL LOW (ref 12.0–15.0)
Hemoglobin: 9.9 g/dL — ABNORMAL LOW (ref 12.0–15.0)
Hemoglobin: 10.5 g/dL — ABNORMAL LOW (ref 12.0–15.0)
O2 Saturation: 89 %
O2 Saturation: 73 %
O2 Saturation: 51 %
Patient temperature: 97.9
Potassium: 4.5 mmol/L (ref 3.5–5.1)
Potassium: 4.8 mmol/L (ref 3.5–5.1)
Potassium: 6.3 mmol/L (ref 3.5–5.1)
Sodium: 141 mmol/L (ref 135–145)
Sodium: 140 mmol/L (ref 135–145)
Sodium: 138 mmol/L (ref 135–145)
TCO2: 31 mmol/L (ref 22–32)
TCO2: 32 mmol/L (ref 22–32)
TCO2: 27 mmol/L (ref 22–32)
pCO2 arterial: 62.7 mmHg — ABNORMAL HIGH (ref 32.0–48.0)
pCO2 arterial: 56.5 mmHg — ABNORMAL HIGH (ref 32.0–48.0)
pCO2 arterial: 71.3 mmHg (ref 32.0–48.0)
pH, Arterial: 7.275 — ABNORMAL LOW (ref 7.350–7.450)
pH, Arterial: 7.331 — ABNORMAL LOW (ref 7.350–7.450)
pH, Arterial: 7.152 — CL (ref 7.350–7.450)
pO2, Arterial: 63 mmHg — ABNORMAL LOW (ref 83.0–108.0)
pO2, Arterial: 42 mmHg — ABNORMAL LOW (ref 83.0–108.0)
pO2, Arterial: 36 mmHg — CL (ref 83.0–108.0)

## 2019-12-06 LAB — CBC WITH DIFFERENTIAL/PLATELET
Abs Immature Granulocytes: 0.55 10*3/uL — ABNORMAL HIGH (ref 0.00–0.07)
Basophils Absolute: 0.1 10*3/uL (ref 0.0–0.1)
Basophils Relative: 1 %
Eosinophils Absolute: 0.3 10*3/uL (ref 0.0–0.5)
Eosinophils Relative: 1 %
HCT: 37.2 % (ref 36.0–46.0)
Hemoglobin: 10.6 g/dL — ABNORMAL LOW (ref 12.0–15.0)
Immature Granulocytes: 3 %
Lymphocytes Relative: 13 %
Lymphs Abs: 2.5 10*3/uL (ref 0.7–4.0)
MCH: 27.3 pg (ref 26.0–34.0)
MCHC: 28.5 g/dL — ABNORMAL LOW (ref 30.0–36.0)
MCV: 95.9 fL (ref 80.0–100.0)
Monocytes Absolute: 0.6 10*3/uL (ref 0.1–1.0)
Monocytes Relative: 3 %
Neutro Abs: 14.8 10*3/uL — ABNORMAL HIGH (ref 1.7–7.7)
Neutrophils Relative %: 79 %
Platelets: 321 10*3/uL (ref 150–400)
RBC: 3.88 MIL/uL (ref 3.87–5.11)
RDW: 16.8 % — ABNORMAL HIGH (ref 11.5–15.5)
WBC: 18.8 10*3/uL — ABNORMAL HIGH (ref 4.0–10.5)
nRBC: 0 % (ref 0.0–0.2)

## 2019-12-06 LAB — PROTIME-INR
INR: 1.2 (ref 0.8–1.2)
Prothrombin Time: 14.9 seconds (ref 11.4–15.2)

## 2019-12-06 LAB — COMPREHENSIVE METABOLIC PANEL
ALT: 76 U/L — ABNORMAL HIGH (ref 0–44)
AST: 129 U/L — ABNORMAL HIGH (ref 15–41)
Albumin: 3 g/dL — ABNORMAL LOW (ref 3.5–5.0)
Alkaline Phosphatase: 98 U/L (ref 38–126)
Anion gap: 13 (ref 5–15)
BUN: 20 mg/dL (ref 8–23)
CO2: 23 mmol/L (ref 22–32)
Calcium: 9.4 mg/dL (ref 8.9–10.3)
Chloride: 103 mmol/L (ref 98–111)
Creatinine, Ser: 1.44 mg/dL — ABNORMAL HIGH (ref 0.44–1.00)
GFR calc Af Amer: 40 mL/min — ABNORMAL LOW (ref >60)
GFR calc non Af Amer: 35 mL/min — ABNORMAL LOW (ref >60)
Glucose, Bld: 297 mg/dL — ABNORMAL HIGH (ref 70–99)
Potassium: 5.2 mmol/L — ABNORMAL HIGH (ref 3.5–5.1)
Sodium: 139 mmol/L (ref 135–145)
Total Bilirubin: 0.5 mg/dL (ref 0.3–1.2)
Total Protein: 7.2 g/dL (ref 6.5–8.1)

## 2019-12-06 LAB — BRAIN NATRIURETIC PEPTIDE: B Natriuretic Peptide: 455.1 pg/mL — ABNORMAL HIGH (ref 0.0–100.0)

## 2019-12-06 LAB — PHOSPHORUS: Phosphorus: 5 mg/dL — ABNORMAL HIGH (ref 2.5–4.6)

## 2019-12-06 LAB — CREATININE, SERUM
Creatinine, Ser: 1.54 mg/dL — ABNORMAL HIGH (ref 0.44–1.00)
GFR calc Af Amer: 37 mL/min — ABNORMAL LOW
GFR calc non Af Amer: 32 mL/min — ABNORMAL LOW

## 2019-12-06 LAB — LACTIC ACID, PLASMA
Lactic Acid, Venous: 3.8 mmol/L (ref 0.5–1.9)
Lactic Acid, Venous: 3.9 mmol/L (ref 0.5–1.9)

## 2019-12-06 LAB — CBG MONITORING, ED: Glucose-Capillary: 266 mg/dL — ABNORMAL HIGH (ref 70–99)

## 2019-12-06 LAB — MAGNESIUM: Magnesium: 1.8 mg/dL (ref 1.7–2.4)

## 2019-12-06 LAB — CORTISOL: Cortisol, Plasma: 44.3 ug/dL

## 2019-12-06 LAB — SARS CORONAVIRUS 2 BY RT PCR (HOSPITAL ORDER, PERFORMED IN ~~LOC~~ HOSPITAL LAB): SARS Coronavirus 2: NEGATIVE

## 2019-12-06 LAB — D-DIMER, QUANTITATIVE: D-Dimer, Quant: >20 ug/mL-FEU — ABNORMAL HIGH (ref 0.00–0.50)

## 2019-12-06 LAB — TROPONIN I (HIGH SENSITIVITY)
Troponin I (High Sensitivity): 247 ng/L (ref <18)
Troponin I (High Sensitivity): 207 ng/L

## 2019-12-06 MED ORDER — MAGNESIUM SULFATE 2 GM/50ML IV SOLN
2.0000 g | Freq: Once | INTRAVENOUS | Status: AC
  Administered 2019-12-06: 2 g via INTRAVENOUS
  Filled 2019-12-06: qty 50

## 2019-12-06 MED ORDER — VANCOMYCIN HCL 1750 MG/350ML IV SOLN
1750.0000 mg | Freq: Once | INTRAVENOUS | Status: AC
  Administered 2019-12-06: 1750 mg via INTRAVENOUS
  Filled 2019-12-06: qty 350

## 2019-12-06 MED ORDER — HYDROMORPHONE HCL 1 MG/ML IJ SOLN: 1.0000 mg | INTRAMUSCULAR | Status: DC | PRN

## 2019-12-06 MED ORDER — IPRATROPIUM-ALBUTEROL 0.5-2.5 (3) MG/3ML IN SOLN: 3.0000 mL | Freq: Four times a day (QID) | RESPIRATORY_TRACT | Status: DC

## 2019-12-06 MED ORDER — ONDANSETRON HCL 4 MG/2ML IJ SOLN
4.0000 mg | Freq: Four times a day (QID) | INTRAMUSCULAR | Status: DC | PRN
  Administered 2019-12-06: 4 mg via INTRAVENOUS
  Filled 2019-12-06: qty 2

## 2019-12-06 MED ORDER — HEPARIN SODIUM (PORCINE) 5000 UNIT/ML IJ SOLN
5000.0000 [IU] | Freq: Three times a day (TID) | INTRAMUSCULAR | Status: DC
  Administered 2019-12-06: 5000 [IU] via SUBCUTANEOUS

## 2019-12-06 MED ORDER — POLYETHYLENE GLYCOL 3350 17 G PO PACK: 17.0000 g | PACK | Freq: Every day | ORAL | Status: DC | PRN

## 2019-12-06 MED ORDER — MORPHINE SULFATE (PF) 2 MG/ML IV SOLN
2.0000 mg | INTRAVENOUS | Status: DC | PRN
  Administered 2019-12-06: 2 mg via INTRAVENOUS
  Filled 2019-12-06: qty 1

## 2019-12-06 MED ORDER — FUROSEMIDE 10 MG/ML IJ SOLN
80.0000 mg | Freq: Once | INTRAMUSCULAR | Status: AC
  Administered 2019-12-06: 80 mg via INTRAVENOUS
  Filled 2019-12-06: qty 8

## 2019-12-06 MED ORDER — FENTANYL 2500MCG IN NS 250ML (10MCG/ML) PREMIX INFUSION: 0.0000 ug/h | INTRAVENOUS | Status: DC

## 2019-12-06 MED ORDER — PROPOFOL 1000 MG/100ML IV EMUL
INTRAVENOUS | Status: AC
  Administered 2019-12-06: 10 ug/kg/min via INTRAVENOUS
  Filled 2019-12-06: qty 100

## 2019-12-06 MED ORDER — PROPOFOL 1000 MG/100ML IV EMUL: 5.0000 ug/kg/min | INTRAVENOUS | Status: DC

## 2019-12-06 MED ORDER — FAMOTIDINE 40 MG/5ML PO SUSR: 20.0000 mg | Freq: Every day | ORAL | Status: DC

## 2019-12-06 MED ORDER — HEPARIN SODIUM (PORCINE) 5000 UNIT/ML IJ SOLN
INTRAMUSCULAR | Status: AC
  Filled 2019-12-06: qty 1

## 2019-12-06 MED ORDER — MIDAZOLAM 50MG/50ML (1MG/ML) PREMIX INFUSION: 0.5000 mg/h | INTRAVENOUS | Status: DC

## 2019-12-06 MED ORDER — DOCUSATE SODIUM 100 MG PO CAPS: 100.0000 mg | ORAL_CAPSULE | Freq: Two times a day (BID) | ORAL | Status: DC | PRN

## 2019-12-06 MED ORDER — ASPIRIN 81 MG PO CHEW: 81.0000 mg | CHEWABLE_TABLET | Freq: Every day | ORAL | Status: DC

## 2019-12-06 MED ORDER — ALBUTEROL SULFATE (2.5 MG/3ML) 0.083% IN NEBU: 2.5000 mg | INHALATION_SOLUTION | RESPIRATORY_TRACT | Status: DC | PRN

## 2019-12-06 MED ORDER — FUROSEMIDE 10 MG/ML IJ SOLN: 40.0000 mg | Freq: Every day | INTRAMUSCULAR | Status: DC

## 2019-12-06 MED ORDER — SODIUM CHLORIDE 0.9 % IV SOLN
2.0000 g | Freq: Once | INTRAVENOUS | Status: AC
  Administered 2019-12-06: 2 g via INTRAVENOUS
  Filled 2019-12-06: qty 2

## 2019-12-11 LAB — CULTURE, BLOOD (ROUTINE X 2)
Culture: NO GROWTH
Culture: NO GROWTH

## 2019-12-18 ENCOUNTER — Telehealth: Payer: Self-pay | Admitting: Cardiovascular Disease

## 2019-12-18 NOTE — Consult Note (Signed)
Responded to page, met with Ms. Adeyemi's family in consultation room: daughter and her 2 daughters (one of whom had pt's power of attorney). They said doctor had called them after pt passed at 1410 while they were traveling here from [place]. Since they did not arrive here till approximately 2000, pt's remains and belongings are in the morgue.   They understand they cannot go to the morgue, but will call the patient placement number in the morning. They may already have the arrangements, but they wish to recover her belongings, especially her wallet, now. Pt's daughter said not here, but at other hospitals, all belongings had not been returned to family. They are staying at Ms. Beckel's house nearby tonight.   Besides information, provided spiritual/ emotional support and prayer. Family is [place]. Pt's pastor had already prayed with them on their way in to [facility], but they said they always appreciated more prayer.   Rev. Eloise Levels Chaplain

## 2019-12-18 NOTE — Telephone Encounter (Signed)
Death certificate received from [facility] for Dr. Burt Knack to sign. Death certificate was given to his nurse, Valetta Fuller. 12/18/19 vlm

## 2019-12-18 NOTE — Progress Notes (Signed)
Cardiology consultation was called on Ms. [name] [name] who suffered an out of hospital cardiac arrest secondary to PEA today.  The patient was a DNI/DNR in the setting of advanced heart failure and O2 dependent COPD.  All of her paperwork was not available and she was intubated by EMS.  At the time of my evaluation, she is on the ventilator with severe hypoxemia.  During that evaluation, she developed recurrent asystole.  It has been well documented that she does not want aggressive measures.  I quickly spoke with Dr. Lynetta Mare who confirmed that the patient is a DNR and does not want CPR.  Dr. Tamera Punt and I were present at the time of the patient's death at 1410 today.  I contacted her granddaughter, [name] [name], and informed her of her grandmother's death.  The granddaughter and other family members are approximately 4 hours away and we will make arrangements so that they can see the patient when they arrive.  Sherren Mocha 2019-12-28 2:19 PM

## 2019-12-18 NOTE — ED Notes (Signed)
Pt's wallet and sandals on a belonging bag, with the pt on morgue inside the body bag.

## 2019-12-18 NOTE — ED Triage Notes (Signed)
Pt here post arrest , ems called out to pt for resp distress pt placed on cpap became unresponsive and lost pulses  Pt given 3 epi's 15 mins cpr and intubated arrived to ED with pulses

## 2019-12-18 NOTE — ED Provider Notes (Signed)
[facility] EMERGENCY DEPARTMENT Provider Note   CSN: [id number] Arrival date & time: 2019-12-28  9242     History No chief complaint on file.   OLENE GODFREY is a 79 y.o. female.  HPI   Patient presents to the emergency department via EMS post cardiac arrest.  Patient was reportedly having significant shortness of breath which is the original reason why EMS was dispatched to the home.  On their arrival, they found the patient to be in respiratory distress and placed on CPAP during transport.  During the transportation process, the patient was witnessed to become unresponsive with agonal breathing.  At that time she lost palpable pulses and CPR was initiated.  Patient received approximately 15 minutes of CPR along with 3 epinephrines before ROSC was obtained.  No shockable rhythms reported.  No hypoglycemia prior to arrival.  EMS also reports some angioedema at the time of arrest however this resolved.  King airway was originally placed but transitioned to endotracheal intubation prior to arrival.  Prior to arrival, family were called and instructed nursing staff that the patient has DNR but they were unable to find the actual DNR form.  Advanced directive forms were available.  Patient with significant medical history including CHF, COPD, and CKD.  Wears oxygen at home; baseline 6L.  Question of whether hospice is involved in this patient's care at the time of evaluation in the emergency department.     Past Medical History:  Diagnosis Date  . Allergic rhinitis   . Anxiety   . Arthritis   . Asthma   . Cancer [facility]) 2006   breast cancer right  . CHF (congestive heart failure) ([place])   . COPD (chronic obstructive pulmonary disease) ([place])   . Degenerative disc disease, lumbar   . Diabetes mellitus without complication ([place])   . Dyspnea    walking distances  . Eczema   . GERD (gastroesophageal reflux disease)   . Glaucoma   . History of home oxygen therapy    uses 2  liters ay night and prn  . Hyperlipidemia   . Hypertension   . Low back pain   . Lupus ([place])    skin  . Neck pain   . Numbness and tingling   . Obesity   . Osteopenia   . Pulmonary embolism ([place]) 01/2012    CT showed multi small PE and coumadized   . Sleep apnea 2010   no cpap used  . Systemic lupus erythematosus ([place])     Patient Active Problem List   Diagnosis Date Noted  . Acute on chronic heart failure ([place]) 11/19/2019  . CHF (congestive heart failure) ([place]) 11/05/2019  . Essential hypertension   . Hyperlipidemia   . Chronic kidney disease, stage 3b   . Fall 05/21/2019  . COPD exacerbation ([place]) 05/21/2019  . Pressure injury of skin 02/21/2019  . Acute respiratory disease due to COVID-19 virus 02/16/2019  . Acute on chronic respiratory failure with hypoxia ([place]) 02/15/2019  . Acute-on-chronic kidney injury ([place]) 02/15/2019  . Sepsis ([place]) 02/15/2019  . Obesity (BMI 30-39.9) 10/31/2018  . CRI (chronic renal insufficiency), stage 3 (moderate) 10/31/2018  . Iron deficiency anemia 04/10/2018  . 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 Abnormality of gait 08/05/2015  . Spinal stenosis of lumbar region 08/05/2015  . Peripheral neuropathy 08/05/2015  . Chest pain 12/28/2013  . Hyperlipidemia associated with type 2 diabetes mellitus ([place]) 04/05/2013  . Systemic lupus erythematosus ([place]) 04/05/2013  .  Pulmonary embolism ([place]) 01/24/2012  . Chronic diastolic CHF (congestive heart failure) ([place]) 01/24/2012  . COPD (chronic obstructive pulmonary disease) ([place]) 01/24/2012  . Diabetes mellitus ([place]) 01/24/2012  . Hypertension associated with diabetes ([place]) 01/24/2012  . Leukocytosis 01/24/2012  . Bronchitis 01/24/2012  . Anemia 01/24/2012    Past Surgical History:  Procedure Laterality Date  . ABDOMINAL HYSTERECTOMY  1976  . BACK SURGERY  2006   lower  . BREAST BIOPSY Right   . BREAST EXCISIONAL BIOPSY Left   . BREAST LUMPECTOMY Right   . COLONOSCOPY WITH  PROPOFOL N/A 03/23/2015   Procedure: COLONOSCOPY WITH PROPOFOL;  Surgeon: Laurence Spates, MD;  Location: [facility] ENDOSCOPY;  Service: Endoscopy;  Laterality: N/A;  . Dobtamine myoview  05/02/2008   EF 67% ; LV norm  . DOPPLER ECHOCARDIOGRAPHY  01/25/2012   EF 55 TO 60%; LV norm.  . EXPLORATORY LAPAROTOMY    . EYE SURGERY Bilateral 2010   lens reaplcments for cataracts   . Lower Extrem. venous doppler  01/25/2012    neg.  . TOE SURGERY  1996   Bunion     OB History   No obstetric history on file.     Family History  Problem Relation Age of Onset  . Heart failure Father   . Heart failure Mother   . Diabetes Brother   . Breast cancer Maternal Aunt   . Breast cancer Paternal Aunt     Social History   Tobacco Use  . Smoking status: Former Research scientist (life sciences)  . Smokeless tobacco: Never Used  . Tobacco comment: Quit 1980  Vaping Use  . Vaping Use: Never used  Substance Use Topics  . Alcohol use: No    Alcohol/week: 0.0 standard drinks  . Drug use: No    Home Medications Prior to Admission medications   Medication Sig Start Date End Date Taking? Authorizing Provider  acetaminophen (TYLENOL) 500 MG tablet Take 500 mg by mouth every 6 (six) hours as needed for mild pain or moderate pain.   Yes [provider]  aspirin EC 81 MG tablet Take 81 mg by mouth daily.   Yes [provider]  cetirizine (ZYRTEC) 10 MG tablet Take 10 mg by mouth daily.   Yes [provider]  citalopram (CELEXA) 10 MG tablet Take 10 mg by mouth daily.    Yes [provider]  desonide (DESOWEN) 0.05 % cream Apply 1 application topically 2 (two) times daily. Apply to face   Yes [provider]  diclofenac sodium (VOLTAREN) 1 % GEL Apply 2 g topically daily as needed (leg pain).    Yes [provider]  diltiazem (CARTIA XT) 300 MG 24 hr capsule Take 1 capsule (300 mg total) by mouth daily. Reported on 09/15/2015 11/21/19  Yes Kyle, Tyrone A, DO  fluticasone (FLONASE) 50  MCG/ACT nasal spray Place 2 sprays into both nostrils daily. Patient taking differently: Place 2 sprays into both nostrils as needed for allergies or rhinitis.  05/24/19  Yes Eugenie Filler, MD  gabapentin (NEURONTIN) 300 MG capsule Take 1 capsule (300 mg total) by mouth 2 (two) times daily. 04/05/18  Yes Jessy Oto, MD  hydroxychloroquine (PLAQUENIL) 200 MG tablet Take 200 mg by mouth 2 (two) times daily.  08/03/19  Yes [provider]  insulin aspart (NOVOLOG) 100 UNIT/ML injection Inject 4 Units into the skin 2 (two) times daily as needed for high blood sugar.    Yes [provider]  insulin glargine (LANTUS) 100  UNIT/ML injection Inject 0.4 mLs (40 Units total) into the skin daily. 11/21/19 12/21/19 Yes Kyle, Tyrone A, DO  ipratropium-albuterol (DUONEB) 0.5-2.5 (3) MG/3ML SOLN Take 3 mLs by nebulization 3 (three) times daily. Use 3 times daily x4 days then every 6 hours as needed. Patient taking differently: Take 3 mLs by nebulization in the morning and at bedtime.  05/23/19  Yes Eugenie Filler, MD  LEVSIN/SL 0.125 MG SUBL Place 0.0625 mg under the tongue 2 (two) times daily as needed (nausea).  10/31/19  Yes [provider]  metoprolol tartrate (LOPRESSOR) 50 MG tablet Take 1 tablet (50 mg total) by mouth 2 (two) times daily. 08/06/19  Yes Lendon Colonel, NP  pravastatin (PRAVACHOL) 40 MG tablet Take 1 tablet (40 mg total) by mouth at bedtime. Patient taking differently: Take 40 mg by mouth daily.  08/06/19  Yes Lendon Colonel, NP  SYMBICORT 160-4.5 MCG/ACT inhaler Inhale 2 puffs into the lungs 2 (two) times daily. Reported on 09/15/2015 04/22/13  Yes [provider]  OXYGEN Inhale 2 L into the lungs daily as needed (shortness of breath).     [provider]    Allergies    Penicillins, Fentanyl, Peach [prunus persica], and Shellfish allergy  Review of Systems   Review of Systems  Unable to perform ROS: Acuity of condition    Physical  Exam Updated Vital Signs BP (!) 127/59   Pulse (!) 34   Temp 99 F (37.2 C)   Resp (!) 36   Ht _0  (1.626 m)   Wt 95.7 kg   SpO2 (!) 65%   BMI 36.21 kg/m   Physical Exam Vitals and nursing note reviewed.  Constitutional:      General: She is in acute distress.     Appearance: She is well-developed. She is obese. She is ill-appearing.     Interventions: She is intubated.  HENT:     Head: Normocephalic and atraumatic.  Eyes:     Extraocular Movements: Extraocular movements intact.     Conjunctiva/sclera: Conjunctivae normal.     Pupils: Pupils are equal, round, and reactive to light.  Cardiovascular:     Rate and Rhythm: Regular rhythm. Tachycardia present.     Pulses: Normal pulses.     Heart sounds: No murmur heard.   Pulmonary:     Effort: Respiratory distress present. She is intubated.     Breath sounds: Wheezing and rhonchi present.  Abdominal:     General: There is no distension.     Palpations: Abdomen is soft.     Tenderness: There is no abdominal tenderness.  Musculoskeletal:     Cervical back: Neck supple.     Right lower leg: Edema present.     Left lower leg: Edema present.  Skin:    General: Skin is warm and dry.     Capillary Refill: Capillary refill takes less than 2 seconds.  Neurological:     Mental Status: She is alert.     GCS: GCS eye subscore is 4. GCS verbal subscore is 1. GCS motor subscore is 6.     ED Results / Procedures / Treatments   Labs (all labs ordered are listed, but only abnormal results are displayed) Labs Reviewed  COMPREHENSIVE METABOLIC PANEL - Abnormal; Notable for the following components:      Result Value   Potassium 5.2 (*)    Glucose, Bld 297 (*)    Creatinine, Ser 1.44 (*)    Albumin 3.0 (*)  AST 129 (*)    ALT 76 (*)    GFR calc non Af Amer 35 (*)    GFR calc Af Amer 40 (*)    All other components within normal limits  LACTIC ACID, PLASMA - Abnormal; Notable for the following components:   Lactic Acid,  Venous 3.8 (*)    All other components within normal limits  CBC WITH DIFFERENTIAL/PLATELET - Abnormal; Notable for the following components:   WBC 18.8 (*)    Hemoglobin 10.6 (*)    MCHC 28.5 (*)    RDW 16.8 (*)    Neutro Abs 14.8 (*)    Abs Immature Granulocytes 0.55 (*)    All other components within normal limits  BRAIN NATRIURETIC PEPTIDE - Abnormal; Notable for the following components:   B Natriuretic Peptide 455.1 (*)    All other components within normal limits  PHOSPHORUS - Abnormal; Notable for the following components:   Phosphorus 5.0 (*)    All other components within normal limits  CBG MONITORING, ED - Abnormal; Notable for the following components:   Glucose-Capillary 266 (*)    All other components within normal limits  I-STAT ARTERIAL BLOOD GAS, ED - Abnormal; Notable for the following components:   pH, Arterial 7.275 (*)    pCO2 arterial 62.7 (*)    pO2, Arterial 63 (*)    Bicarbonate 29.3 (*)    HCT 30.0 (*)    Hemoglobin 10.2 (*)    All other components within normal limits  I-STAT ARTERIAL BLOOD GAS, ED - Abnormal; Notable for the following components:   pH, Arterial 7.331 (*)    pCO2 arterial 56.5 (*)    pO2, Arterial 42 (*)    Bicarbonate 29.9 (*)    Acid-Base Excess 3.0 (*)    HCT 29.0 (*)    Hemoglobin 9.9 (*)    All other components within normal limits  CULTURE, BLOOD (ROUTINE X 2)  SARS CORONAVIRUS 2 BY RT PCR (HOSPITAL ORDER, [facility])  CULTURE, BLOOD (ROUTINE X 2)  URINE CULTURE  URINE CULTURE  CULTURE, RESPIRATORY  MAGNESIUM  PROTIME-INR  LACTIC ACID, PLASMA  URINALYSIS, ROUTINE W REFLEX MICROSCOPIC  BLOOD GAS, ARTERIAL  PROCALCITONIN  D-DIMER, QUANTITATIVE (NOT AT [facility])  CREATININE, SERUM  BRAIN NATRIURETIC PEPTIDE  CORTISOL  STREP PNEUMONIAE URINARY ANTIGEN  LEGIONELLA PNEUMOPHILA SEROGP 1 UR AG  BLOOD GAS, ARTERIAL  BRAIN NATRIURETIC PEPTIDE  CBC  TROPONIN I (HIGH SENSITIVITY)     EKG None  Radiology DG Chest Port 1 View  Result Date: 23-Dec-2019 CLINICAL DATA:  CPR.  ETT adjustment EXAM: PORTABLE CHEST 1 VIEW COMPARISON:  Same-day x-ray at 1010 hours FINDINGS: Interval retraction of endotracheal tube which now terminates approximately 2.1 cm above the carina. Enteric tube courses below the diaphragm with distal tip beyond the inferior margin of the film. Overlying cardiac leads. Heart size is stable. Extensive airspace opacities persist throughout both lungs. Suspect small right pleural effusion. No discernible pneumothorax. No displaced rib fracture is evident. IMPRESSION: 1. Interval retraction of endotracheal tube which now terminates approximately 2.1 cm above the carina. 2. Otherwise stable appearance of the chest. 3. No discernible rib fracture or pneumothorax. Electronically Signed   By: Davina Poke D.O.   On: 12/23/19 13:00   DG Chest Portable 1 View  Result Date: 23-Dec-2019 CLINICAL DATA:  Status post cardiac arrest.  Intubation. EXAM: PORTABLE CHEST 1 VIEW COMPARISON:  11/19/2019 FINDINGS: The endotracheal tube tip projects at the proximal right  mainstem bronchus. Gastric tube courses below the diaphragm in outside the field of view. Extensive right greater than left alveolar opacities. Possible small right pleural effusion. No pneumothorax, although evaluation is limited on this portable supine radiograph. Cardiopericardial and mediastinal silhouette is largely obscured. Both lungs are clear. The visualized skeletal structures are unremarkable. IMPRESSION: 1. The endotracheal tube tip projects at the proximal right mainstem bronchus. Recommend retraction by approximately 5 cm. 2. Extensive right greater than left alveolar opacities, most likely flash pulmonary edema given reported cardiac arrest. Superimposed pneumonia and/or aspiration is not excluded. 3. No discernible pneumothorax; however, evaluation is limited on portable supine radiographs. Critical  Value/emergent results were called by telephone at the time of interpretation on 2019/12/21 at 10:28 am to provider [facility] , who verbally acknowledged these results. Electronically Signed   By: Margaretha Sheffield MD   On: 21-Dec-2019 10:34    Procedures Procedures (including critical care time)  Medications Ordered in ED Medications  propofol (DIPRIVAN) 1000 MG/100ML infusion (5 mcg/kg/min  95.7 kg Intravenous Rate/Dose Change 2019-12-21 1053)  vancomycin (VANCOREADY) IVPB 1750 mg/350 mL (1,750 mg Intravenous New Bag/Given 12/21/19 1216)  docusate sodium (COLACE) capsule 100 mg (has no administration in time range)  polyethylene glycol (MIRALAX / GLYCOLAX) packet 17 g (has no administration in time range)  heparin injection 5,000 Units (has no administration in time range)  magnesium sulfate IVPB 2 g 50 mL (has no administration in time range)  ondansetron (ZOFRAN) injection 4 mg (has no administration in time range)  famotidine (PEPCID) 40 MG/5ML suspension 20 mg (0 mg Per Tube Hold 12/21/2019 1221)  ipratropium-albuterol (DUONEB) 0.5-2.5 (3) MG/3ML nebulizer solution 3 mL (has no administration in time range)  albuterol (PROVENTIL) (2.5 MG/3ML) 0.083% nebulizer solution 2.5 mg (has no administration in time range)  aspirin chewable tablet 81 mg (0 mg Oral Hold 12-21-19 1221)  furosemide (LASIX) injection 40 mg (has no administration in time range)  morphine 2 MG/ML injection 2 mg (has no administration in time range)  ceFEPIme (MAXIPIME) 2 g in sodium chloride 0.9 % 100 mL IVPB ( Intravenous Stopped 12/21/19 1210)  furosemide (LASIX) injection 80 mg (80 mg Intravenous Given 21-Dec-2019 1137)    ED Course   Dashley SALISHA BARDSLEY is a 79 y.o. female with PMHx listed that presents to the Emergency Department complaint of No chief complaint on file.  ED Course: Initial exam completed.   Ill-appearing.  Hemodynamically stable.  Potentially toxic.  Afebrile.  Physical exam significant for 79 year old female with  obesity, bilateral coarse breath sounds that are extensive throughout all lung fields, lower extremity edema, and abdomen status soft, nondistended without any focal masses; no evidence of external traumatic injury.  Initial differential includes acute on chronic CHF exacerbation, COPD/asthma exacerbation, anaphylaxis, sepsis of multiple sources, and electrolyte abnormalities.  Patient with good perfusion and strong pulses on arrival with EMS.  Transfer to our stretcher and placed on monitoring.  Ventilating easily with RT.  Chest x-ray at bedside showed deep endotracheal tube placed via EMS which was retracted by RT and reconfirmed with XR.  Some hypoxia on initial ventilator settings; PEEP adjusted and patient placed at 30 degrees upright.  Suspect pulmonary edema/potential aspiration.  Broad work-up ordered including sepsis evaluation.  Holding IV fluid resuscitation at this time given concern for volume overload.  Propofol for sedation.  The patient's neurologic status appears intact and she awakened prior to sedation being initiated.  EKG sinus rhythm with bundle branch block without acute ST changes; normal  rate and intervals.  CBC with nonspecific leukocytosis 18.8 stable hemoglobin although slightly anemic 10.6.  CMP with no acute electrolyte MIs requiring urgent intervention although does have mildly elevated LFTs with AST 129 and ALT 76 which could be ischemic in the setting of cardiac arrest.  Magnesium within normal notes.  Phosphorus 5.  Initial lactic acid 3.8.  BNP pending.  COVID-19 negative.   CXR with evidence of pulmonary edema concern for flash pulmonary edema in the setting of cardiac arrest.  IV Lasix 80 mg ordered.  Will treat broad-spectrum antibiotics vancomycin and cefepime for coverage.  Discussed with critical care, who agreed to admit the patient to their service for further management.   Attempted to reach Yaneisy Wenz @ [phone] multiple times for collateral  information.   Diagnostics Vital Signs: reviewed Labs: reviewed and significant findings discussed above Imaging: personally reviewed images interpreted by radiology EKG: reviewed Records: nursing notes along with previous records reviewed and pertinent data discussed   Consults:  Critical Care   Reevaluation/Disposition:  Upon reevaluation, patients symptoms remain critical.  Care transferred to ICU.    Sherolyn Buba, MD Emergency Medicine, PGY-3   Note: Dragon medical dictation software was used in the creation of this note.   Final Clinical Impression(s) / ED Diagnoses Final diagnoses:  Cardiac arrest ([place])  Acute respiratory failure with hypoxia [employer])    Rx / [place] Orders ED Discharge Orders    None       Frann Rider, MD 12/26/19 1518    Malvin Johns, MD 12-26-19 1529

## 2019-12-18 NOTE — ED Provider Notes (Signed)
From visit on 01/02/2020  CRITICAL CARE Performed by: Malvin Johns Total critical care time: 90 minutes Critical care time was exclusive of separately billable procedures and treating other patients. Critical care was necessary to treat or prevent imminent or life-threatening deterioration. Critical care was time spent personally by me on the following activities: development of treatment plan with patient and/or surrogate as well as nursing, discussions with consultants, evaluation of patient's response to treatment, examination of patient, obtaining history from patient or surrogate, ordering and performing treatments and interventions, ordering and review of laboratory studies, ordering and review of radiographic studies, pulse oximetry and re-evaluation of patient's condition.    Malvin Johns, MD 12/16/19 1106

## 2019-12-18 NOTE — ED Notes (Signed)
Date and time results received: Dec 10, 2019 at 11:16am  Test: LACTIC  Critical Value: 3.8  Name of Provider Notified:Dr. Tamera Punt

## 2019-12-18 NOTE — ED Notes (Signed)
Date and time results received: 2019/12/07 (use smartphrase ".now" to insert current time)  Test: tROP Critical Value: 247 Name of Provider Notified: Agar[place]ala Orders Received? Or Actions Taken?:

## 2019-12-18 NOTE — ED Notes (Signed)
Pt pronounced death by Dr. Burt Knack, Cardiology and DR. Tamera Punt, ED provider.

## 2019-12-18 NOTE — H&P (Addendum)
NAME:  [name] [name], MRN:  [id number], DOB:  Mar 22, 1941, LOS: 0 ADMISSION DATE:  2019-12-28, CONSULTATION DATE: 2019-12-28 REFERRING MD: Emergency department physician, CHIEF COMPLAINT: PEA arrest  Brief History   79 year old female with a history of DNR multiple health issues PEA arrest with return of spontaneous circulation.  History of present illness   79 year old female with a plethora of health issues as well documented below.  She did well for having COVID-19 in November 2020 but did not require intubation.  She has had multiple episodes of chest pain where she is going to the hospital for evaluation and treatment.  She also has known congestive heart failure, multiple myeloma, COPD O2 dependent with 6 L of home she also has a history of diabetes mellitus gastroesophageal reflux disease.  Today 2019-12-28 she called EMS for shortness of breath.  She was placed in ambulance on CPAP in route she became agonal breathing had loss of pulse required CPR x15 minutes with 3 epinephrines.  She had return of spontaneous circulation and was intubated in the ED  and pulmonary critical care was asked to evaluate and treat.  She carries a DNR but no paperwork was available at the time of evaluation in the emergency department.  Her family is in route from [place] until that time we will continue to treat her aggressively. We will get a cardiology consult since she is followed by Dr. Alvester Chou in cardiology we will also order 2D echo since she does not have a recent 2D echo on record.   There is some concern for sepsis therefore a procalcitonin is been ordered should be started empirically on we will continue to evaluate and adjust treatments as needed.\ He will be admitted to the intensive care unit for further evaluation and treatment. Past Medical History   Past Medical History:  Diagnosis Date  . Allergic rhinitis   . Anxiety   . Arthritis   . Asthma   . Cancer [facility]) 2006   breast cancer right  .  CHF (congestive heart failure) ([place])   . COPD (chronic obstructive pulmonary disease) ([place])   . Degenerative disc disease, lumbar   . Diabetes mellitus without complication ([place])   . Dyspnea    walking distances  . Eczema   . GERD (gastroesophageal reflux disease)   . Glaucoma   . History of home oxygen therapy    uses 2 liters ay night and prn  . Hyperlipidemia   . Hypertension   . Low back pain   . Lupus ([place])    skin  . Neck pain   . Numbness and tingling   . Obesity   . Osteopenia   . Pulmonary embolism ([place]) 01/2012    CT showed multi small PE and coumadized   . Sleep apnea 2010   no cpap used  . Systemic lupus erythematosus ([place])      Significant Hospital Events   PEA arrest  Consults:    Procedures:  December 28, 2019 intubation  Significant Diagnostic Tests:    Micro Data:   12/28/19 blood cultures x2  28-Dec-2019 urine culture2  Antimicrobials:   12/28/19 vancomycin 28-Dec-2019 cefepime  Interim history/subjective:  Status post cardiac arrest intubated admitted to the intensive care unit  Objective   Blood pressure (!) 88/52, pulse 89, temperature (!) 96.9 F (36.1 C), temperature source Temporal, resp. rate (!) 28, height $RemoveBe'5\' 4"'JdDBPPEmn$  (1.626 m), weight 95.7 kg, SpO2 91 %.    Vent Mode: PRVC FiO2 (%):  [100 %] 100 %  Set Rate:  [24 bmp] 24 bmp Vt Set:  [430 mL] 430 mL PEEP:  [10 cmH20] 10 cmH20 Plateau Pressure:  [39 cmH20] 39 cmH20  No intake or output data in the 24 hours ending 2020/01/01 1146 Filed Weights   01/01/20 1004  Weight: 95.7 kg    Examination: General: Morbid obese female who is intubated but follows commands HENT: Endotracheal tube is in place Lungs: Diminished breath sounds throughout Cardiovascular: Sounds regular without murmur Abdomen: Obese soft faint bowel sounds Extremities: Bilateral lower extremity chronic venous stasis changes left IO in place Neuro: Follows commands intubated and sedated   Resolved Hospital Problem list       Assessment & Plan:  Vent dependent respiratory failure and 79 year old female with a plethora of health issues most notably for COPD SLE, history of COVID-19 infection November 2020 which did not require intubation.  She is status post PEA arrest requiring intubation in the emergency room CPR x15 minutes with 3 epinephrines.  History of PE but not on anticoagulants.  On home O2 at 6 L Will admit to the intensive care unit Continue aggressive care until family arrives to further explore no CODE STATUS. Wean FiO2 as tolerated Stat arterial blood gas Check for COVID-19 Lower extremity Doppler study to rule out DVT Heparin sq may need drip if CE elevated Diuresis as tolerated  History of congestive heart failure No record of 2D echo from 2017 therefore repeat 2D echo   History of lupus On Plaquenil for now  Questionable sepsis Repeat lactic acid Check procalcitonin Empirical antimicrobial therapy Some question of aspiration Chest x-ray with bilateral airspace disease with a history of Covid in November 2020  Diabetes mellitus Sliding scale insulin protocol  AMS Follows commands therefore no reason for CT of the head   Multiple myeloma is followed by oncology Questionable oncology consult   Chronic renal insufficiency Lab Results  Component Value Date   CREATININE 1.44 (H) 01-01-20   CREATININE 1.54 (H) 11/20/2019   CREATININE 1.47 (H) 11/19/2019   CREATININE 1.61 (H) 06/27/2019   CREATININE 1.88 (H) 12/28/2018   CREATININE 1.91 (H) 12/20/2018   CREATININE 2.05 (H) 04/10/2018   CREATININE 2.0 (H) 03/15/2017   CREATININE 1.52 (H) 09/24/2013   Avoid nephrotoxins   Best practice:  Diet: npo Pain/Anxiety/Delirium protocol (if indicated): Sedation as needed for tube tolerance VAP protocol (if indicated): As indicated DVT prophylaxis: Compression hose GI prophylaxis: PPI Glucose control: Sliding scale insulin Mobility: Bedrest Code Status: Wants a DNR but  cannot find DNR form or family to confirm Family Communication: Unable to reach family at this time Disposition: Intensive care unit if survives  Labs   CBC: Recent Labs  Lab January 01, 2020 1033  WBC 18.8*  NEUTROABS 14.8*  HGB 10.6*  HCT 37.2  MCV 95.9  PLT 263    Basic Metabolic Panel: Recent Labs  Lab 01-01-20 1033  NA 139  K 5.2*  CL 103  CO2 23  GLUCOSE 297*  BUN 20  CREATININE 1.44*  CALCIUM 9.4  MG 1.8  PHOS 5.0*   GFR: Estimated Creatinine Clearance: 36.1 mL/min (A) (by C-G formula based on SCr of 1.44 mg/dL (H)). Recent Labs  Lab 2020-01-01 1008 01/01/20 1033  WBC  --  18.8*  LATICACIDVEN 3.8*  --     Liver Function Tests: Recent Labs  Lab 01-Jan-2020 1033  AST 129*  ALT 76*  ALKPHOS 98  BILITOT 0.5  PROT 7.2  ALBUMIN 3.0*   No results for input(s): LIPASE,  AMYLASE in the last 168 hours. No results for input(s): AMMONIA in the last 168 hours.  ABG    Component Value Date/Time   PHART 7.431 01/24/2012 1717   PCO2ART 35.9 01/24/2012 1717   PO2ART 65.5 (L) 01/24/2012 1717   HCO3 23.5 01/24/2012 1717   TCO2 24 09/12/2014 2004   ACIDBASEDEF 0.0 01/24/2012 1717   O2SAT 92.8 01/24/2012 1717     Coagulation Profile: Recent Labs  Lab Dec 13, 2019 1045  INR 1.2    Cardiac Enzymes: No results for input(s): CKTOTAL, CKMB, CKMBINDEX, TROPONINI in the last 168 hours.  HbA1C: Hgb A1c MFr Bld  Date/Time Value Ref Range Status  11/05/2019 12:40 AM 7.0 (H) 4.8 - 5.6 % Final    Comment:    (NOTE) Pre diabetes:          5.7%-6.4%  Diabetes:              >6.4%  Glycemic control for   <7.0% adults with diabetes   05/21/2019 11:47 AM 5.2 4.8 - 5.6 % Final    Comment:    (NOTE) Pre diabetes:          5.7%-6.4% Diabetes:              >6.4% Glycemic control for   <7.0% adults with diabetes     CBG: Recent Labs  Lab 12-13-19 0959  GLUCAP 266*    Review of Systems:   na  Past Medical History  She,  has a past medical history of Allergic  rhinitis, Anxiety, Arthritis, Asthma, Cancer ([place]) (2006), CHF (congestive heart failure) ([place]), COPD (chronic obstructive pulmonary disease) ([place]), Degenerative disc disease, lumbar, Diabetes mellitus without complication ([place]), Dyspnea, Eczema, GERD (gastroesophageal reflux disease), Glaucoma, History of home oxygen therapy, Hyperlipidemia, Hypertension, Low back pain, Lupus ([place]), Neck pain, Numbness and tingling, Obesity, Osteopenia, Pulmonary embolism ([place]) (01/2012), Sleep apnea (2010), and Systemic lupus erythematosus ([place]).   Surgical History    Past Surgical History:  Procedure Laterality Date  . ABDOMINAL HYSTERECTOMY  1976  . BACK SURGERY  2006   lower  . BREAST BIOPSY Right   . BREAST EXCISIONAL BIOPSY Left   . BREAST LUMPECTOMY Right   . COLONOSCOPY WITH PROPOFOL N/A 03/23/2015   Procedure: COLONOSCOPY WITH PROPOFOL;  Surgeon: Laurence Spates, MD;  Location: [facility] ENDOSCOPY;  Service: Endoscopy;  Laterality: N/A;  . Dobtamine myoview  05/02/2008   EF 67% ; LV norm  . DOPPLER ECHOCARDIOGRAPHY  01/25/2012   EF 55 TO 60%; LV norm.  . EXPLORATORY LAPAROTOMY    . EYE SURGERY Bilateral 2010   lens reaplcments for cataracts   . Lower Extrem. venous doppler  01/25/2012    neg.  . [place]     Social History   reports that she has quit smoking. She has never used smokeless tobacco. She reports that she does not drink alcohol and does not use drugs.   Family History   Her family history includes Breast cancer in her maternal aunt and paternal aunt; Diabetes in her brother; Heart failure in her father and mother.   Allergies Allergies  Allergen Reactions  . Penicillins Swelling    Has patient had a PCN reaction causing immediate rash, facial/tongue/throat swelling, SOB or lightheadedness with hypotension: Yes Has patient had a PCN reaction causing severe rash involving mucus membranes or skin necrosis: Yes Has patient had a PCN reaction that required  hospitalization Yes Has patient had a PCN reaction occurring within the  last 10 years: No, more than 10 yrs ago If all of the above answers are "NO", then may proceed with Cephalosporin use.   . Fentanyl Itching  . Peach [Prunus Persica] Hives  . Shellfish Allergy Hives     Home Medications  Prior to Admission medications   Medication Sig Start Date End Date Taking? Authorizing Provider  acetaminophen (TYLENOL) 500 MG tablet Take 500 mg by mouth every 6 (six) hours as needed for mild pain or moderate pain.    [provider]  aspirin EC 81 MG tablet Take 81 mg by mouth daily.    [provider]  bumetanide (BUMEX) 0.5 MG tablet Take 0.5 mg by mouth 2 (two) times daily.    [provider]  cetirizine (ZYRTEC) 10 MG tablet Take 10 mg by mouth daily.    [provider]  citalopram (CELEXA) 10 MG tablet Take 10 mg by mouth daily.     [provider]  desonide (DESOWEN) 0.05 % cream Apply 1 application topically 2 (two) times daily. Apply to face    [provider]  diclofenac sodium (VOLTAREN) 1 % GEL Apply 2 g topically daily as needed (leg pain).     [provider]  diltiazem (CARTIA XT) 300 MG 24 hr capsule Take 1 capsule (300 mg total) by mouth daily. Reported on 09/15/2015 11/21/19   Cherylann Ratel A, DO  fluticasone (FLONASE) 50 MCG/ACT nasal spray Place 2 sprays into both nostrils daily. Patient taking differently: Place 2 sprays into both nostrils as needed for allergies or rhinitis.  05/24/19   Eugenie Filler, MD  gabapentin (NEURONTIN) 300 MG capsule Take 1 capsule (300 mg total) by mouth 2 (two) times daily. 04/05/18   Jessy Oto, MD  hydroxychloroquine (PLAQUENIL) 200 MG tablet Take 200 mg by mouth 2 (two) times daily.  08/03/19   [provider]  insulin aspart (NOVOLOG) 100 UNIT/ML injection Inject 4 Units into the skin 2 (two) times daily as needed for high blood sugar.     [provider]  insulin  glargine (LANTUS) 100 UNIT/ML injection Inject 0.4 mLs (40 Units total) into the skin daily. 11/21/19 12/21/19  Cherylann Ratel A, DO  ipratropium-albuterol (DUONEB) 0.5-2.5 (3) MG/3ML SOLN Take 3 mLs by nebulization 3 (three) times daily. Use 3 times daily x4 days then every 6 hours as needed. Patient taking differently: Take 3 mLs by nebulization in the morning and at bedtime.  05/23/19   Eugenie Filler, MD  LEVSIN/SL 0.125 MG SUBL Place 0.0625 mg under the tongue 2 (two) times daily as needed.  10/31/19   [provider]  metoprolol tartrate (LOPRESSOR) 50 MG tablet Take 1 tablet (50 mg total) by mouth 2 (two) times daily. 08/06/19   Lendon Colonel, NP  OXYGEN Inhale 2 L into the lungs daily as needed (shortness of breath).     [provider]  pravastatin (PRAVACHOL) 40 MG tablet Take 1 tablet (40 mg total) by mouth at bedtime. Patient taking differently: Take 40 mg by mouth daily.  08/06/19   Lendon Colonel, NP  SYMBICORT 160-4.5 MCG/ACT inhaler Inhale 2 puffs into the lungs 2 (two) times daily. Reported on 09/15/2015 04/22/13   [provider]     Critical care time: 45 min

## 2019-12-18 NOTE — Discharge Planning (Signed)
RNCM contacted by [employer] [facility].

## 2019-12-18 NOTE — ED Notes (Signed)
RT notified to collect a blood gas.

## 2019-12-18 DEATH — deceased

## 2019-12-19 NOTE — Telephone Encounter (Signed)
Signed death certificate faxed to [facility]. Original mailed to address below. 12/19/19 [place] Dept Attn: Vital Records [address] [place], [place] [address]

## 2019-12-26 ENCOUNTER — Ambulatory Visit: Payer: Medicare HMO | Admitting: Specialist

## 2020-01-07 ENCOUNTER — Other Ambulatory Visit: Payer: Medicare HMO

## 2020-01-07 ENCOUNTER — Ambulatory Visit: Payer: Medicare HMO | Admitting: Hematology
